# Patient Record
Sex: Male | Born: 1940 | Race: White | Hispanic: No | Smoking: Never smoker
Health system: Southern US, Community
[De-identification: ages and names within clinical notes are randomized; demographics above are authoritative.]

## PROBLEM LIST (undated history)

## (undated) DIAGNOSIS — E785 Hyperlipidemia, unspecified: Secondary | ICD-10-CM

## (undated) DIAGNOSIS — K219 Gastro-esophageal reflux disease without esophagitis: Secondary | ICD-10-CM

## (undated) DIAGNOSIS — I10 Essential (primary) hypertension: Secondary | ICD-10-CM

## (undated) DIAGNOSIS — Z972 Presence of dental prosthetic device (complete) (partial): Secondary | ICD-10-CM

## (undated) DIAGNOSIS — M1990 Unspecified osteoarthritis, unspecified site: Secondary | ICD-10-CM

## (undated) DIAGNOSIS — Z974 Presence of external hearing-aid: Secondary | ICD-10-CM

## (undated) DIAGNOSIS — M542 Cervicalgia: Secondary | ICD-10-CM

## (undated) DIAGNOSIS — I499 Cardiac arrhythmia, unspecified: Secondary | ICD-10-CM

## (undated) DIAGNOSIS — T7840XA Allergy, unspecified, initial encounter: Secondary | ICD-10-CM

## (undated) DIAGNOSIS — D170 Benign lipomatous neoplasm of skin and subcutaneous tissue of head, face and neck: Secondary | ICD-10-CM

## (undated) HISTORY — DX: Essential (primary) hypertension: I10

## (undated) HISTORY — PX: UPPER GI ENDOSCOPY: SHX6162

## (undated) HISTORY — DX: Unspecified osteoarthritis, unspecified site: M19.90

## (undated) HISTORY — DX: Allergy, unspecified, initial encounter: T78.40XA

## (undated) HISTORY — DX: Benign lipomatous neoplasm of skin and subcutaneous tissue of head, face and neck: D17.0

## (undated) HISTORY — DX: Hyperlipidemia, unspecified: E78.5

## (undated) HISTORY — DX: Cervicalgia: M54.2

## (undated) HISTORY — DX: Gastro-esophageal reflux disease without esophagitis: K21.9

## (undated) SURGERY — Surgical Case
Anesthesia: *Unknown

---

## 2006-07-13 ENCOUNTER — Ambulatory Visit: Payer: Self-pay | Admitting: Gastroenterology

## 2011-10-28 HISTORY — PX: GASTRIC OUTLET OBSTRUCTION RELEASE: SHX5247

## 2012-06-04 ENCOUNTER — Ambulatory Visit: Payer: Self-pay | Admitting: Unknown Physician Specialty

## 2012-06-04 LAB — CBC WITH DIFFERENTIAL/PLATELET
Basophil %: 0.7 %
Eosinophil %: 1.8 %
HCT: 25.2 % — ABNORMAL LOW (ref 40.0–52.0)
HGB: 8.8 g/dL — ABNORMAL LOW (ref 13.0–18.0)
Lymphocyte #: 1 10*3/uL (ref 1.0–3.6)
MCH: 31.7 pg (ref 26.0–34.0)
MCV: 91 fL (ref 80–100)
Monocyte #: 0.5 x10 3/mm (ref 0.2–1.0)
Monocyte %: 6.7 %
RBC: 2.78 10*6/uL — ABNORMAL LOW (ref 4.40–5.90)
WBC: 7.2 10*3/uL (ref 3.8–10.6)

## 2012-06-04 LAB — PROTIME-INR: INR: 0.9

## 2012-06-09 ENCOUNTER — Ambulatory Visit: Payer: Self-pay | Admitting: Family Medicine

## 2012-08-13 ENCOUNTER — Ambulatory Visit: Payer: Self-pay | Admitting: Unknown Physician Specialty

## 2012-08-13 LAB — CBC WITH DIFFERENTIAL/PLATELET
Basophil #: 0 10*3/uL (ref 0.0–0.1)
Basophil %: 0.6 %
Eosinophil %: 1.9 %
HCT: 32 % — ABNORMAL LOW (ref 40.0–52.0)
HGB: 10.3 g/dL — ABNORMAL LOW (ref 13.0–18.0)
Lymphocyte #: 0.9 10*3/uL — ABNORMAL LOW (ref 1.0–3.6)
Lymphocyte %: 16.4 %
MCH: 23.7 pg — ABNORMAL LOW (ref 26.0–34.0)
MCHC: 32.3 g/dL (ref 32.0–36.0)
Monocyte %: 5.2 %
Neutrophil #: 4.1 10*3/uL (ref 1.4–6.5)
Neutrophil %: 75.9 %
RBC: 4.36 10*6/uL — ABNORMAL LOW (ref 4.40–5.90)
RDW: 19.5 % — ABNORMAL HIGH (ref 11.5–14.5)

## 2013-10-27 HISTORY — PX: OTHER SURGICAL HISTORY: SHX169

## 2013-11-02 ENCOUNTER — Ambulatory Visit: Payer: Self-pay | Admitting: Gastroenterology

## 2013-11-03 ENCOUNTER — Ambulatory Visit: Payer: Self-pay | Admitting: Gastroenterology

## 2013-11-09 ENCOUNTER — Ambulatory Visit: Payer: Self-pay | Admitting: Surgery

## 2013-11-14 ENCOUNTER — Ambulatory Visit: Payer: Self-pay | Admitting: Surgery

## 2013-11-14 LAB — COMPREHENSIVE METABOLIC PANEL
ALT: 69 U/L (ref 12–78)
AST: 41 U/L — AB (ref 15–37)
Albumin: 3.4 g/dL (ref 3.4–5.0)
Alkaline Phosphatase: 91 U/L
Anion Gap: 5 — ABNORMAL LOW (ref 7–16)
BILIRUBIN TOTAL: 0.5 mg/dL (ref 0.2–1.0)
BUN: 21 mg/dL — ABNORMAL HIGH (ref 7–18)
CALCIUM: 9.1 mg/dL (ref 8.5–10.1)
CHLORIDE: 102 mmol/L (ref 98–107)
Co2: 29 mmol/L (ref 21–32)
Creatinine: 0.9 mg/dL (ref 0.60–1.30)
EGFR (African American): >60
EGFR (Non-African Amer.): >60
Glucose: 112 mg/dL — ABNORMAL HIGH (ref 65–99)
Osmolality: 276 (ref 275–301)
Potassium: 3.8 mmol/L (ref 3.5–5.1)
SODIUM: 136 mmol/L (ref 136–145)
TOTAL PROTEIN: 6.7 g/dL (ref 6.4–8.2)

## 2013-11-14 LAB — CBC WITH DIFFERENTIAL/PLATELET
Basophil #: 0 10*3/uL (ref 0.0–0.1)
Basophil %: 0.6 %
EOS ABS: 0.1 10*3/uL (ref 0.0–0.7)
Eosinophil %: 0.8 %
HCT: 38.5 % — ABNORMAL LOW (ref 40.0–52.0)
HGB: 13.5 g/dL (ref 13.0–18.0)
Lymphocyte #: 1.1 10*3/uL (ref 1.0–3.6)
Lymphocyte %: 16.7 %
MCH: 31.7 pg (ref 26.0–34.0)
MCHC: 35 g/dL (ref 32.0–36.0)
MCV: 91 fL (ref 80–100)
MONOS PCT: 4.6 %
Monocyte #: 0.3 x10 3/mm (ref 0.2–1.0)
NEUTROS ABS: 5.2 10*3/uL (ref 1.4–6.5)
Neutrophil %: 77.3 %
Platelet: 196 10*3/uL (ref 150–440)
RBC: 4.25 10*6/uL — ABNORMAL LOW (ref 4.40–5.90)
RDW: 13.2 % (ref 11.5–14.5)
WBC: 6.7 10*3/uL (ref 3.8–10.6)

## 2013-11-14 LAB — APTT: Activated PTT: 25.7 secs (ref 23.6–35.9)

## 2013-11-14 LAB — PROTIME-INR
INR: 0.9
Prothrombin Time: 12.4 secs (ref 11.5–14.7)

## 2013-11-23 ENCOUNTER — Inpatient Hospital Stay: Payer: Self-pay | Admitting: Surgery

## 2013-11-24 LAB — CBC WITH DIFFERENTIAL/PLATELET
Basophil #: 0 10*3/uL (ref 0.0–0.1)
Basophil %: 0.3 %
Eosinophil #: 0 10*3/uL (ref 0.0–0.7)
Eosinophil %: 0 %
HCT: 38.2 % — ABNORMAL LOW (ref 40.0–52.0)
HGB: 13.7 g/dL (ref 13.0–18.0)
LYMPHS PCT: 3.9 %
Lymphocyte #: 0.4 10*3/uL — ABNORMAL LOW (ref 1.0–3.6)
MCH: 32.9 pg (ref 26.0–34.0)
MCHC: 35.9 g/dL (ref 32.0–36.0)
MCV: 92 fL (ref 80–100)
MONO ABS: 0.6 x10 3/mm (ref 0.2–1.0)
MONOS PCT: 6 %
NEUTROS PCT: 89.8 %
Neutrophil #: 9.5 10*3/uL — ABNORMAL HIGH (ref 1.4–6.5)
Platelet: 163 10*3/uL (ref 150–440)
RBC: 4.16 10*6/uL — ABNORMAL LOW (ref 4.40–5.90)
RDW: 13.7 % (ref 11.5–14.5)
WBC: 10.5 10*3/uL (ref 3.8–10.6)

## 2013-11-24 LAB — BASIC METABOLIC PANEL
Anion Gap: 5 — ABNORMAL LOW (ref 7–16)
BUN: 21 mg/dL — AB (ref 7–18)
Calcium, Total: 8.8 mg/dL (ref 8.5–10.1)
Chloride: 101 mmol/L (ref 98–107)
Co2: 31 mmol/L (ref 21–32)
Creatinine: 1.12 mg/dL (ref 0.60–1.30)
Glucose: 137 mg/dL — ABNORMAL HIGH (ref 65–99)
Osmolality: 279 (ref 275–301)
Potassium: 4.4 mmol/L (ref 3.5–5.1)
Sodium: 137 mmol/L (ref 136–145)

## 2013-11-25 LAB — CBC WITH DIFFERENTIAL/PLATELET
BASOS PCT: 0.3 %
Basophil #: 0 10*3/uL (ref 0.0–0.1)
Eosinophil #: 0 10*3/uL (ref 0.0–0.7)
Eosinophil %: 0.2 %
HCT: 38.2 % — AB (ref 40.0–52.0)
HGB: 13.4 g/dL (ref 13.0–18.0)
LYMPHS PCT: 6.8 %
Lymphocyte #: 0.6 10*3/uL — ABNORMAL LOW (ref 1.0–3.6)
MCH: 32.5 pg (ref 26.0–34.0)
MCHC: 35.2 g/dL (ref 32.0–36.0)
MCV: 93 fL (ref 80–100)
Monocyte #: 0.5 x10 3/mm (ref 0.2–1.0)
Monocyte %: 6.3 %
NEUTROS ABS: 7.5 10*3/uL — AB (ref 1.4–6.5)
NEUTROS PCT: 86.4 %
PLATELETS: 161 10*3/uL (ref 150–440)
RBC: 4.13 10*6/uL — ABNORMAL LOW (ref 4.40–5.90)
RDW: 13.9 % (ref 11.5–14.5)
WBC: 8.7 10*3/uL (ref 3.8–10.6)

## 2013-11-25 LAB — BASIC METABOLIC PANEL
Anion Gap: 1 — ABNORMAL LOW (ref 7–16)
BUN: 19 mg/dL — ABNORMAL HIGH (ref 7–18)
CHLORIDE: 101 mmol/L (ref 98–107)
Calcium, Total: 9.4 mg/dL (ref 8.5–10.1)
Co2: 33 mmol/L — ABNORMAL HIGH (ref 21–32)
Creatinine: 0.99 mg/dL (ref 0.60–1.30)
EGFR (African American): >60
EGFR (Non-African Amer.): >60
GLUCOSE: 125 mg/dL — AB (ref 65–99)
Osmolality: 274 (ref 275–301)
POTASSIUM: 5.7 mmol/L — AB (ref 3.5–5.1)
SODIUM: 135 mmol/L — AB (ref 136–145)

## 2013-11-25 LAB — POTASSIUM: Potassium: 4.3 mmol/L (ref 3.5–5.1)

## 2013-11-27 LAB — BASIC METABOLIC PANEL
ANION GAP: 5 — AB (ref 7–16)
BUN: 12 mg/dL (ref 7–18)
CO2: 31 mmol/L (ref 21–32)
Calcium, Total: 9 mg/dL (ref 8.5–10.1)
Chloride: 102 mmol/L (ref 98–107)
Creatinine: 0.71 mg/dL (ref 0.60–1.30)
EGFR (African American): >60
Glucose: 114 mg/dL — ABNORMAL HIGH (ref 65–99)
Osmolality: 276 (ref 275–301)
Potassium: 3.7 mmol/L (ref 3.5–5.1)
Sodium: 138 mmol/L (ref 136–145)

## 2014-02-02 ENCOUNTER — Ambulatory Visit: Payer: Self-pay | Admitting: Surgery

## 2015-02-17 NOTE — Consult Note (Signed)
PATIENT NAME:  Zachary Brewer, Zachary Brewer MR#:  [id number] DATE OF BIRTH:  Mar 23, 1941  DATE OF CONSULTATION:  11/23/2013  REFERRING PHYSICIAN:  Dia Crawford, MD CONSULTING PHYSICIAN:  Miosotis Wetsel R. Yazmyne Sara, MD PRIMARY CARE PHYSICIAN: Brooke Dare, MD  REASON FOR CONSULTATION: Hypertension, hyperlipidemia, postoperative management.   HISTORY OF PRESENTING ILLNESS: A 74 year old Caucasian male patient with history of hypertension and hyperlipidemia recently had an endoscopy on 11/03/2013 and was found to have duodenal deformity with gastric outlet obstruction. Was brought into the hospital for a Roux-en-Y gastrojejunostomy. The patient is presently in the recovery area after his surgery. He is awake on Ventimask with an NG tube in place with bloody output.   The patient complains of some pain at this time. No shortness of breath, chest pain. He mentions that he has been doing well, has lost some weight although he cannot tell me how much. He also noticed that his blood pressure was running low so he held his blood pressure medication, although he is not sure which one.   PAST MEDICAL HISTORY:  1.  Hypertension.  2.  Hyperlipidemia.  3.  Gastric outlet obstruction.   SOCIAL HISTORY: The patient does not smoke. No alcohol. No illicit drugs. Lives alone. Ambulates on his own.   ALLERGIES: No known drug allergies.   FAMILY HISTORY: Hypertension.   HOME MEDICATIONS:  Include:  1.  Aspirin 81 mg daily. 2.  Enalapril 20 mg oral daily.  3.  Fish oil 2 capsules oral once a day.  4.  Glucosamine chondroitin 2 tablets once a day.  5.  Hydrochlorothiazide 25 mg oral once a day.  6.  Lovastatin 40 mg oral once a day.  7.  Omeprazole 20 mg oral 2 times a day.   REVIEW OF SYSTEMS:  CONSTITUTIONAL: Complains of some fatigue at this time.  EYES: No blurred vision, pain or redness. ENT:  No tinnitus, ear pain, hearing loss.  RESPIRATORY: No shortness of breath.  CARDIOVASCULAR: No chest pain, orthopnea or edema.   GASTROINTESTINAL: Some abdominal pain at this time. No nausea, vomiting.  GENITOURINARY: No dysuria, hematuria, frequency.  ENDOCRINE: No polyuria, nocturia, or thyroid problems. HEMOLYMPHATIC: No anemia, easy bruising, bleeding. INTEGUMENTARY: No acne, rash. Has surgical scar.  NEUROLOGIC: No focal numbness, weakness, or seizures. Feels generally weak.    PHYSICAL EXAMINATION:  VITAL SIGNS: Blood pressure134 /79, pulse of 61, saturating 100%. Breathing 20 per minute.  GENERAL: Frail Caucasian male patient lying in bed, seems comfortable.  PSYCHIATRIC: Alert and oriented x3. Mood and affect appropriate. Judgment intact.  HEENT: Atraumatic, normocephalic. Oral mucosa dry and pink. Pallor positive. External ears and nose normal.  NECK: Supple. No thyromegaly or palpable lymph nodes. Trachea midline. No carotid bruit, JVD.  CARDIOVASCULAR: S1, S2, without any murmurs. Peripheral pulses 2+.  RESPIRATORY: Normal work of breathing. Clear to auscultation on both sides.  GASTROINTESTINAL: Soft abdomen. Nontender. Bowel sounds present. Scar from surgery earlier today. Clean dressing.  GENITOURINARY: No CVA tenderness or bladder distention. Has a Foley in place.  MUSCULOSKELETAL: No joint swelling, redness, effusion of the large joints. Normal muscle tone.  NEUROLOGICAL: Motor strength 5/5 in upper and lower extremities.  SKIN: Warm and dry. No petechiae, rash. Has the surgical scar on the abdomen.  LABORATORY STUDIES: 11/14/2013: Glucose 112, BUN 21, creatinine 0.90. WBC 6.7, hemoglobin 13.5, platelets of 196. Blood group AB positive.   Recent EKG shows sinus bradycardia with heart rate of 57.   ASSESSMENT AND PLAN:  1.  Gastric outlet obstruction,  status post Roux-en-Y procedure. Monitor for any acute blood loss anemia. The patient is presently n.p.o. Has an NG tube in place.  2.  Hypertension. The patient cannot take his oral medications. Will start him on hydralazine IV. Can restart on his  home medications once able to take pills.  3.  Deep venous thrombosis prophylaxis, on Lovenox.   TIME SPENT TODAY ON THIS CONSULT: Was 40 minutes.   ____________________________ Leia Alf Alger Kerstein, MD srs:np D: 11/23/2013 15:36:53 ET T: 11/23/2013 16:25:09 ET JOB#: [id number]  cc: Alveta Heimlich R. Darvin Neighbours, MD, <Dictator> Micheline Maze, MD Arlis Porta., MD Neita Carp MD ELECTRONICALLY SIGNED 12/15/2013 14:09

## 2015-02-17 NOTE — Op Note (Signed)
PATIENT NAME:  Zachary Brewer, DEUPREE MR#:  [id number] DATE OF BIRTH:  June 29, 1941  DATE OF PROCEDURE:  11/23/2013  PREOPERATIVE DIAGNOSIS:  Gastric outlet obstruction.    POSTOPERATIVE DIAGNOSIS: Duodenal obstruction.   SURGERY: Exploratory laparotomy, pyloroplasty, Roux-en-Y gastrojejunostomy.   SURGEON: Rodena Goldmann, M.D.   ASSISTANTGenevive Bi, MD; [place], [place] student.   ANESTHESIA: General.   OPERATIVE PROCEDURE: With the patient in the supine position after the induction of appropriate general anesthesia, the patient's abdomen was prepped with ChloraPrep and draped with sterile towels. A midline incision was made from the subxiphoid area to the supraumbilical area and carried down through the subcutaneous tissue using Bovie electrocautery. Midline fascia was identified and opened the length of  the skin incision, as was the peritoneum. The stomach was significantly dilated. There were residual food particles palpated. Nasogastric tube was in place to help decompress the stomach. The pylorus was markedly dilated. The obstruction appeared to be well down the duodenum. There did not appear to be any evidence of extrinsic compression, no mass in the head of the pancreas, no porta hepatis lesions and no evidence of any adenopathy. A pyloroplasty was made by opening the pylorus longitudinally and examining the pylorus. The muscle was quite thin.  There did not appear to be any obvious deformity. However, well down past the common bile duct it was apparent that the patient had a very clear obstruction. There did not appear to be any active ulceration. The lumen was approximately the size of pencil lead. Bile was coming back up through the hole. Again, we could not palpate any evidence for an extrinsic or intrinsic defect but the obstruction was clearly enough to dilate the pylorus. I did not think that gastrectomy was indicated in this situation. We certainly could not resect the obstruction as it was well down the  duodenum. We performed closure of the pyloroplasty going transversely using 2 applications of the ID78 stapling device carrying a blue load, then oversewing with seromuscular sutures of 3-0 silk.   It was elected to perform a Roux-en-Y gastrojejunostomy. The small bowel was identified in the jejunum of approximately 18 inches from the ligament of Treitz. The bowel was divided and marked.  The GIA-55 stapling device was used to divide the bowel. The mesentery was taken down past the first arcade. An avascular area in the colonic mesentery was chosen and the bowel  passed through the mesentery to the posterior wall of the stomach. The posterior wall of the stomach had been exposed by taking down the omentum and entering the lesser sac. A pursestring clamp was placed across the back wall of the stomach and then 2-0 nylon pursestring placed through the clamp. The bowel was opened. An EEA-25 stapling device was brought to the table. The anvil was placed in the pursestring and the suture tied down around the anvil. The Roux loop was opened and the body of the stapler inserted through the Roux loop, passed out through the anterior mesenteric border with the spike. The spike was married to the anvil, the stapler approximated and fired. Several more centimeters of jejunum were removed and the enterotomy closed with a single application of the EU23 stapling device carrying a blue load. Seromuscular sutures of 3-0 silk were placed to hold the anastomosis in place. There did not appear to be any defect around the mesenteric placement. The afferent loop was then brought downstream on the jejunum. Two loops of bowel were placed side by side along the antimesenteric border  and a small enterotomy made in each loop of bowel. The stapling device was placed in the loop of bowel using a GIA-55 stapler. The stapler was approximated and fired and the enterotomy closed with a single application of the ME15 device carrying a blue load.    The repair appeared to be satisfactory. There did not appear to be significant bleeding. Bowel contents were returned to their anatomic position. The abdomen was copiously irrigated with warm saline solution. The abdomen was closed from each end using looped #1 PDS in a running fashion, tying in the middle. The knot was buried. The skin was stapled using a standard stapling device and sterile dressing applied with dressing sponges and ABD pads. The patient was returned to the recovery room, having tolerated the procedure well. Sponge, instrument and needle counts were correct x 2 in the Operating Room.   ____________________________ Micheline Maze, MD rle:cs D: 11/23/2013 15:04:33 ET T: 11/23/2013 18:35:27 ET JOB#: [id number]  cc: Rodena Goldmann III, MD, <Dictator> Rodena Goldmann MD ELECTRONICALLY SIGNED 11/25/2013 18:08

## 2015-02-17 NOTE — Discharge Summary (Signed)
PATIENT NAME:  Zachary Brewer, Zachary Brewer MR#:  [id number] DATE OF BIRTH:  1941-05-15  DATE OF ADMISSION:  11/23/2013 DATE OF DISCHARGE:  11/29/2013  BRIEF HISTORY: Zachary Brewer is a 74 year old gentleman with gastric outlet obstruction admitted for an elective gastrectomy reconstructive surgery. He had a previous endoscopy, which revealed a very significant narrowing in the pylorus prompting his surgical intervention. He had had multiple problems with weight loss, significant early satiety and significant nausea and vomiting. After an appropriate preoperative preparation and informed consent, he was taken to surgery on the morning of 11/23/2013. At surgery, the pylorus was quite enlarged and the obstruction appeared to be in the second portion of the duodenum. A pyloroplasty was performed in order to identify the stricture and it did not appear to be malignant or extrinsic. We assumed that it was status post peptic ulcer disease with his previous biopsy report. Because of the distal location in the midportion of the duodenum, I did not attempt a resection. We closed the pyloric incision transversely and performed a Roux-en-Y gastric jejunostomy. The procedure was uncomplicated. He had no significant postoperative problems. He was followed by the medical service for his underlying medical problems. He continued to progress to a regular diet. He had no significant pain control issues. He was discharged home on the 3rd to be followed in the office in 7 to 10 days' time. Bathing, activity and driving instructions were given to the patient.   DISCHARGE MEDICATIONS: Include omeprazole 20 mg b.i.d., lovastatin 40 mg once a day, hydrochlorothiazide 25 mg once a day, aspirin 81 mg once a day, glucosamine chondroitin 400/500 once a day, enalapril 20 mg once a day, Allegra p.r.n. and hydrocodone/acetaminophen 5/325 every 4 to 6 hours.  DISCHARGE DIAGNOSIS: Gastric outlet obstruction.   SURGERY: Roux-en-Y gastrojejunostomy.    ____________________________ Rodena Goldmann III, MD rle:aw D: 12/05/2013 23:12:52 ET T: 12/06/2013 07:04:04 ET JOB#: [id number]  cc: Micheline Maze, MD, <Dictator> Lucilla Lame, MD Arlis Porta., MD Rodena Goldmann MD ELECTRONICALLY SIGNED 12/07/2013 1:09

## 2015-05-16 ENCOUNTER — Encounter: Payer: Self-pay | Admitting: Family Medicine

## 2015-05-30 ENCOUNTER — Ambulatory Visit (INDEPENDENT_AMBULATORY_CARE_PROVIDER_SITE_OTHER): Payer: Medicare Other | Admitting: Family Medicine

## 2015-05-30 ENCOUNTER — Encounter: Payer: Self-pay | Admitting: Family Medicine

## 2015-05-30 VITALS — BP 114/69 | HR 63 | Temp 98.2°F | Resp 16 | Ht 66.0 in | Wt 144.2 lb

## 2015-05-30 DIAGNOSIS — J309 Allergic rhinitis, unspecified: Secondary | ICD-10-CM | POA: Insufficient documentation

## 2015-05-30 DIAGNOSIS — K2100 Gastro-esophageal reflux disease with esophagitis, without bleeding: Secondary | ICD-10-CM | POA: Insufficient documentation

## 2015-05-30 DIAGNOSIS — J45909 Unspecified asthma, uncomplicated: Secondary | ICD-10-CM | POA: Insufficient documentation

## 2015-05-30 DIAGNOSIS — R001 Bradycardia, unspecified: Secondary | ICD-10-CM | POA: Insufficient documentation

## 2015-05-30 DIAGNOSIS — D649 Anemia, unspecified: Secondary | ICD-10-CM | POA: Insufficient documentation

## 2015-05-30 DIAGNOSIS — I10 Essential (primary) hypertension: Secondary | ICD-10-CM | POA: Insufficient documentation

## 2015-05-30 DIAGNOSIS — M542 Cervicalgia: Secondary | ICD-10-CM

## 2015-05-30 DIAGNOSIS — K311 Adult hypertrophic pyloric stenosis: Secondary | ICD-10-CM | POA: Diagnosis not present

## 2015-05-30 DIAGNOSIS — D170 Benign lipomatous neoplasm of skin and subcutaneous tissue of head, face and neck: Secondary | ICD-10-CM

## 2015-05-30 DIAGNOSIS — M1990 Unspecified osteoarthritis, unspecified site: Secondary | ICD-10-CM

## 2015-05-30 DIAGNOSIS — N5089 Other specified disorders of the male genital organs: Secondary | ICD-10-CM | POA: Insufficient documentation

## 2015-05-30 DIAGNOSIS — M159 Polyosteoarthritis, unspecified: Secondary | ICD-10-CM | POA: Insufficient documentation

## 2015-05-30 DIAGNOSIS — R739 Hyperglycemia, unspecified: Secondary | ICD-10-CM | POA: Insufficient documentation

## 2015-05-30 DIAGNOSIS — R634 Abnormal weight loss: Secondary | ICD-10-CM | POA: Insufficient documentation

## 2015-05-30 DIAGNOSIS — K921 Melena: Secondary | ICD-10-CM | POA: Insufficient documentation

## 2015-05-30 HISTORY — DX: Cervicalgia: M54.2

## 2015-05-30 HISTORY — DX: Benign lipomatous neoplasm of skin and subcutaneous tissue of head, face and neck: D17.0

## 2015-05-30 HISTORY — DX: Unspecified osteoarthritis, unspecified site: M19.90

## 2015-05-30 NOTE — Progress Notes (Signed)
Name: Zachary Brewer   MRN: [id number]    DOB: 21-Jan-1941   Date:05/30/2015       Progress Note  Subjective  Chief Complaint  Chief Complaint  Patient presents with  . Abdominal Pain    Trouble digesting food.     HPI  C/o abd. Pain and food not going out of stomach for past week.  Has hd some belching and vomiting of old food.  Stomach gets full.  Hx. Of gastric outlet obstruction in past with surgical repair.   Past Medical History  Diagnosis Date  . Allergy   . GERD (gastroesophageal reflux disease)   . Hyperlipidemia   . Hypertension     History  Substance Use Topics  . Smoking status: Never Smoker   . Smokeless tobacco: Never Used  . Alcohol Use: No     Current outpatient prescriptions:  .  aspirin 81 MG chewable tablet, Chew by mouth., Disp: , Rfl:  .  DOCUSATE SODIUM PO, Take by mouth., Disp: , Rfl:  .  Glucosamine-Chondroit-Vit C-Mn (GLUCOSAMINE CHONDR 1500 COMPLX) CAPS, Take by mouth., Disp: , Rfl:  .  hydrochlorothiazide (HYDRODIURIL) 25 MG tablet, Take by mouth., Disp: , Rfl:  .  lovastatin (MEVACOR) 40 MG tablet, Take by mouth., Disp: , Rfl:  .  MULTIPLE VITAMIN PO, Take by mouth., Disp: , Rfl:  .  NUTRITIONAL SUPPLEMENTS PO, Take by mouth., Disp: , Rfl:  .  omeprazole (PRILOSEC) 20 MG capsule, Take by mouth., Disp: , Rfl:  .  tamsulosin (FLOMAX) 0.4 MG CAPS capsule, Take by mouth., Disp: , Rfl:   No Known Allergies  Review of Systems  Constitutional: Positive for weight loss. Negative for fever, chills and malaise/fatigue.  HENT: Negative for congestion.   Eyes: Negative for blurred vision and double vision.  Respiratory: Negative for cough, sputum production, shortness of breath and wheezing.   Cardiovascular: Negative for chest pain, palpitations, orthopnea and leg swelling.  Gastrointestinal: Positive for vomiting and abdominal pain. Negative for heartburn, nausea, diarrhea and blood in stool.  Genitourinary: Negative for dysuria, urgency and  frequency.  Musculoskeletal: Negative for myalgias and joint pain.  Skin: Negative for rash.  Neurological: Negative for dizziness, weakness and headaches.  Psychiatric/Behavioral: Negative for depression. The patient is not nervous/anxious.       Objective  Filed Vitals:   05/30/15 1331  BP: 114/69  Pulse: 63  Temp: 98.2 F (36.8 C)  Resp: 16  Height: 5\' 6"  (1.676 m)  Weight: 144 lb 3.2 oz (65.409 kg)     Physical Exam  Constitutional: He is well-developed, well-nourished, and in no distress. No distress.  HENT:  Head: Normocephalic and atraumatic.  Neck: Normal range of motion. Neck supple. No thyromegaly present.  Cardiovascular: Normal rate, regular rhythm, normal heart sounds and intact distal pulses.   Occasional extrasystoles are present. Exam reveals no gallop and no friction rub.   No murmur heard. Pulmonary/Chest: Effort normal and breath sounds normal. No respiratory distress. He has no wheezes. He has no rales.  Abdominal: Soft. Bowel sounds are normal. He exhibits no distension and no mass. There is no tenderness. There is no rebound and no guarding.  Musculoskeletal: He exhibits no edema.  Lymphadenopathy:    He has no cervical adenopathy.  Vitals reviewed.     No results found for this or any previous visit (from the past 2160 hour(s)).   Assessment & Plan  1. Partial gastric outlet obstruction  - Ambulatory referral to Gastroenterology

## 2015-05-30 NOTE — Patient Instructions (Signed)
Stop Aspirin today.

## 2015-05-31 ENCOUNTER — Encounter: Payer: Self-pay | Admitting: Gastroenterology

## 2015-05-31 ENCOUNTER — Ambulatory Visit (INDEPENDENT_AMBULATORY_CARE_PROVIDER_SITE_OTHER): Payer: Medicare Other | Admitting: Gastroenterology

## 2015-05-31 ENCOUNTER — Other Ambulatory Visit: Payer: Self-pay

## 2015-05-31 VITALS — BP 118/78 | HR 71 | Temp 98.4°F | Ht 67.0 in | Wt 146.0 lb

## 2015-05-31 DIAGNOSIS — R1013 Epigastric pain: Secondary | ICD-10-CM | POA: Diagnosis not present

## 2015-05-31 NOTE — Progress Notes (Signed)
   Primary Care Physician: [name], MD  Primary Gastroenterologist:  Dr. Lucilla Lame  Chief Complaint  Patient presents with  . Abdominal Pain    HPI: Zachary Brewer is a 74 y.o. male here for dyspepsia with the patient reporting that after he eats sometimes a couple of hours later the food is coming up. The patient has had a history of gastric outlet obstruction with a Roux-en-Y by Dr. Pat Patrick. The patient states that sometimes the food feels like it won't go down for for few days. The patient has lost weight but he states he has gained it back.  Current Outpatient Prescriptions  Medication Sig Dispense Refill  . DOCUSATE SODIUM PO Take by mouth.    . Glucosamine-Chondroit-Vit C-Mn (GLUCOSAMINE CHONDR 1500 COMPLX) CAPS Take by mouth.    . hydrochlorothiazide (HYDRODIURIL) 25 MG tablet Take by mouth.    . lovastatin (MEVACOR) 40 MG tablet Take by mouth.    . MULTIPLE VITAMIN PO Take by mouth.    . NUTRITIONAL SUPPLEMENTS PO Take by mouth.    Marland Kitchen omeprazole (PRILOSEC) 20 MG capsule Take by mouth.    . tamsulosin (FLOMAX) 0.4 MG CAPS capsule Take by mouth.     No current facility-administered medications for this visit.    Allergies as of 05/31/2015  . (No Known Allergies)    ROS:  General: Negative for anorexia, weight loss, fever, chills, fatigue, weakness. ENT: Negative for hoarseness, difficulty swallowing , nasal congestion. CV: Negative for chest pain, angina, palpitations, dyspnea on exertion, peripheral edema.  Respiratory: Negative for dyspnea at rest, dyspnea on exertion, cough, sputum, wheezing.  GI: See history of present illness. GU:  Negative for dysuria, hematuria, urinary incontinence, urinary frequency, nocturnal urination.  Endo: Negative for unusual weight change.    Physical Examination:   BP 118/78 mmHg  Pulse 71  Temp(Src) 98.4 F (36.9 C) (Oral)  Ht 5\' 7"  (1.702 m)  Wt 146 lb (66.225 kg)  BMI 22.86 kg/m2  General: Well-nourished, well-developed  in no acute distress.  Eyes: No icterus. Conjunctivae pink. Mouth: Oropharyngeal mucosa moist and pink , no lesions erythema or exudate. Abdomen: Bowel sounds are normal, nontender, nondistended, no hepatosplenomegaly or masses, no abdominal bruits or hernia , no rebound or guarding.   Extremities: No lower extremity edema. No clubbing or deformities. Neuro: Alert and oriented x 3.  Grossly intact. Skin: Warm and dry, no jaundice.   Psych: Alert and cooperative, normal mood and affect.  Labs:    Imaging Studies: No results found.  Assessment and Plan:   Zachary Brewer is a 74 y.o. y/o male has a history of gastric outlet obstruction with a resulting surgery of a Roux-en-Y. The patient now has some abdominal discomfort with regurgitation and the feeling that food is getting stuck. The patient will be set up for an EGD. The patient has been explained the plan and agrees with it. I have discussed risks & benefits which include, but are not limited to, bleeding, infection, perforation & drug reaction.  The patient agrees with this plan & written consent will be obtained.      Note: This dictation was prepared with Dragon dictation along with smaller phrase technology. Any transcriptional errors that result from this process are unintentional.

## 2015-06-18 NOTE — Discharge Instructions (Signed)

## 2015-06-20 ENCOUNTER — Ambulatory Visit (AMBULATORY_SURGERY_CENTER)
Admission: RE | Admit: 2015-06-20 | Discharge: 2015-06-20 | Disposition: A | Discharge status: Other Acute Inpatient Hospital | Payer: Medicare Other | Source: Ambulatory Visit | Attending: Gastroenterology | Admitting: Gastroenterology

## 2015-06-20 ENCOUNTER — Ambulatory Visit: Payer: Medicare Other

## 2015-06-20 ENCOUNTER — Encounter
Admission: RE | Disposition: A | Discharge status: Other Acute Inpatient Hospital | Payer: Self-pay | Source: Ambulatory Visit | Attending: Gastroenterology

## 2015-06-20 ENCOUNTER — Ambulatory Visit: Payer: Medicare Other | Admitting: Anesthesiology

## 2015-06-20 ENCOUNTER — Inpatient Hospital Stay
Admission: AD | Admit: 2015-06-20 | Discharge: 2015-06-25 | DRG: 163 | Disposition: A | Discharge status: Home / Self Care | Payer: Medicare Other | Source: Ambulatory Visit | Attending: Internal Medicine | Admitting: Internal Medicine

## 2015-06-20 ENCOUNTER — Inpatient Hospital Stay: Payer: Medicare Other

## 2015-06-20 DIAGNOSIS — Z823 Family history of stroke: Secondary | ICD-10-CM

## 2015-06-20 DIAGNOSIS — R1310 Dysphagia, unspecified: Secondary | ICD-10-CM | POA: Diagnosis present

## 2015-06-20 DIAGNOSIS — I959 Hypotension, unspecified: Secondary | ICD-10-CM | POA: Diagnosis present

## 2015-06-20 DIAGNOSIS — Z818 Family history of other mental and behavioral disorders: Secondary | ICD-10-CM | POA: Diagnosis not present

## 2015-06-20 DIAGNOSIS — Z79899 Other long term (current) drug therapy: Secondary | ICD-10-CM

## 2015-06-20 DIAGNOSIS — M1990 Unspecified osteoarthritis, unspecified site: Secondary | ICD-10-CM | POA: Diagnosis present

## 2015-06-20 DIAGNOSIS — Z048 Encounter for examination and observation for other specified reasons: Secondary | ICD-10-CM | POA: Diagnosis not present

## 2015-06-20 DIAGNOSIS — I10 Essential (primary) hypertension: Secondary | ICD-10-CM | POA: Diagnosis present

## 2015-06-20 DIAGNOSIS — J9601 Acute respiratory failure with hypoxia: Secondary | ICD-10-CM | POA: Diagnosis present

## 2015-06-20 DIAGNOSIS — E876 Hypokalemia: Secondary | ICD-10-CM | POA: Diagnosis not present

## 2015-06-20 DIAGNOSIS — K219 Gastro-esophageal reflux disease without esophagitis: Secondary | ICD-10-CM | POA: Diagnosis present

## 2015-06-20 DIAGNOSIS — E440 Moderate protein-calorie malnutrition: Secondary | ICD-10-CM | POA: Diagnosis present

## 2015-06-20 DIAGNOSIS — E785 Hyperlipidemia, unspecified: Secondary | ICD-10-CM | POA: Diagnosis present

## 2015-06-20 DIAGNOSIS — R739 Hyperglycemia, unspecified: Secondary | ICD-10-CM | POA: Diagnosis present

## 2015-06-20 DIAGNOSIS — J9811 Atelectasis: Secondary | ICD-10-CM | POA: Diagnosis present

## 2015-06-20 DIAGNOSIS — K5669 Other intestinal obstruction: Secondary | ICD-10-CM

## 2015-06-20 DIAGNOSIS — Z8249 Family history of ischemic heart disease and other diseases of the circulatory system: Secondary | ICD-10-CM

## 2015-06-20 DIAGNOSIS — J690 Pneumonitis due to inhalation of food and vomit: Secondary | ICD-10-CM | POA: Diagnosis not present

## 2015-06-20 DIAGNOSIS — E441 Mild protein-calorie malnutrition: Secondary | ICD-10-CM | POA: Insufficient documentation

## 2015-06-20 DIAGNOSIS — R05 Cough: Secondary | ICD-10-CM

## 2015-06-20 DIAGNOSIS — R0689 Other abnormalities of breathing: Secondary | ICD-10-CM | POA: Diagnosis not present

## 2015-06-20 DIAGNOSIS — T17308A Unspecified foreign body in larynx causing other injury, initial encounter: Secondary | ICD-10-CM | POA: Diagnosis not present

## 2015-06-20 DIAGNOSIS — D66 Hereditary factor VIII deficiency: Secondary | ICD-10-CM | POA: Diagnosis not present

## 2015-06-20 DIAGNOSIS — K311 Adult hypertrophic pyloric stenosis: Secondary | ICD-10-CM | POA: Diagnosis present

## 2015-06-20 DIAGNOSIS — R059 Cough, unspecified: Secondary | ICD-10-CM

## 2015-06-20 DIAGNOSIS — T17890A Other foreign object in other parts of respiratory tract causing asphyxiation, initial encounter: Secondary | ICD-10-CM | POA: Diagnosis present

## 2015-06-20 DIAGNOSIS — T17990A Other foreign object in respiratory tract, part unspecified in causing asphyxiation, initial encounter: Secondary | ICD-10-CM | POA: Diagnosis not present

## 2015-06-20 DIAGNOSIS — T17908A Unspecified foreign body in respiratory tract, part unspecified causing other injury, initial encounter: Secondary | ICD-10-CM

## 2015-06-20 DIAGNOSIS — Z833 Family history of diabetes mellitus: Secondary | ICD-10-CM

## 2015-06-20 DIAGNOSIS — R4702 Dysphasia: Secondary | ICD-10-CM | POA: Diagnosis present

## 2015-06-20 DIAGNOSIS — Z9889 Other specified postprocedural states: Secondary | ICD-10-CM

## 2015-06-20 DIAGNOSIS — T17908D Unspecified foreign body in respiratory tract, part unspecified causing other injury, subsequent encounter: Secondary | ICD-10-CM

## 2015-06-20 DIAGNOSIS — I700 Atherosclerosis of aorta: Secondary | ICD-10-CM | POA: Diagnosis not present

## 2015-06-20 DIAGNOSIS — R0989 Other specified symptoms and signs involving the circulatory and respiratory systems: Secondary | ICD-10-CM | POA: Diagnosis not present

## 2015-06-20 DIAGNOSIS — R609 Edema, unspecified: Secondary | ICD-10-CM | POA: Diagnosis present

## 2015-06-20 DIAGNOSIS — Z6822 Body mass index (BMI) 22.0-22.9, adult: Secondary | ICD-10-CM | POA: Diagnosis not present

## 2015-06-20 DIAGNOSIS — J181 Lobar pneumonia, unspecified organism: Secondary | ICD-10-CM | POA: Diagnosis not present

## 2015-06-20 DIAGNOSIS — K56699 Other intestinal obstruction unspecified as to partial versus complete obstruction: Secondary | ICD-10-CM | POA: Insufficient documentation

## 2015-06-20 DIAGNOSIS — I2510 Atherosclerotic heart disease of native coronary artery without angina pectoris: Secondary | ICD-10-CM | POA: Diagnosis not present

## 2015-06-20 DIAGNOSIS — N400 Enlarged prostate without lower urinary tract symptoms: Secondary | ICD-10-CM | POA: Diagnosis present

## 2015-06-20 DIAGNOSIS — R7981 Abnormal blood-gas level: Secondary | ICD-10-CM

## 2015-06-20 DIAGNOSIS — R1013 Epigastric pain: Secondary | ICD-10-CM | POA: Diagnosis not present

## 2015-06-20 DIAGNOSIS — R0902 Hypoxemia: Secondary | ICD-10-CM | POA: Diagnosis not present

## 2015-06-20 HISTORY — PX: ESOPHAGOGASTRODUODENOSCOPY (EGD) WITH PROPOFOL: SHX5813

## 2015-06-20 LAB — CBC
HCT: 45.7 % (ref 40.0–52.0)
Hemoglobin: 15.8 g/dL (ref 13.0–18.0)
MCH: 31.5 pg (ref 26.0–34.0)
MCHC: 34.6 g/dL (ref 32.0–36.0)
MCV: 91.1 fL (ref 80.0–100.0)
PLATELETS: 157 10*3/uL (ref 150–440)
RBC: 5.01 MIL/uL (ref 4.40–5.90)
RDW: 13.5 % (ref 11.5–14.5)
WBC: 4.2 10*3/uL (ref 3.8–10.6)

## 2015-06-20 LAB — BASIC METABOLIC PANEL
Anion gap: 11 (ref 5–15)
BUN: 12 mg/dL (ref 6–20)
CO2: 34 mmol/L — ABNORMAL HIGH (ref 22–32)
Calcium: 9 mg/dL (ref 8.9–10.3)
Chloride: 96 mmol/L — ABNORMAL LOW (ref 101–111)
Creatinine, Ser: 0.72 mg/dL (ref 0.61–1.24)
GFR calc Af Amer: >60 mL/min (ref >60)
GLUCOSE: 129 mg/dL — AB (ref 65–99)
POTASSIUM: 3.4 mmol/L — AB (ref 3.5–5.1)
Sodium: 141 mmol/L (ref 135–145)

## 2015-06-20 SURGERY — ESOPHAGOGASTRODUODENOSCOPY (EGD) WITH PROPOFOL
Anesthesia: Monitor Anesthesia Care | Wound class: Clean Contaminated

## 2015-06-20 MED ORDER — CLINDAMYCIN PHOSPHATE 300 MG/50ML IV SOLN
300.0000 mg | Freq: Three times a day (TID) | INTRAVENOUS | Status: DC
  Administered 2015-06-20 – 2015-06-25 (×15): 300 mg via INTRAVENOUS
  Filled 2015-06-20 (×19): qty 50

## 2015-06-20 MED ORDER — GLYCOPYRROLATE 0.2 MG/ML IJ SOLN
INTRAMUSCULAR | Status: DC | PRN
  Administered 2015-06-20: 0.1 mg via INTRAVENOUS

## 2015-06-20 MED ORDER — LIDOCAINE HCL (CARDIAC) 20 MG/ML IV SOLN
INTRAVENOUS | Status: DC | PRN
  Administered 2015-06-20: 30 mg via INTRAVENOUS

## 2015-06-20 MED ORDER — IPRATROPIUM-ALBUTEROL 0.5-2.5 (3) MG/3ML IN SOLN
3.0000 mL | RESPIRATORY_TRACT | Status: DC
  Administered 2015-06-20 – 2015-06-25 (×22): 3 mL via RESPIRATORY_TRACT
  Filled 2015-06-20 (×23): qty 3

## 2015-06-20 MED ORDER — ALBUTEROL SULFATE (2.5 MG/3ML) 0.083% IN NEBU
2.5000 mg | INHALATION_SOLUTION | Freq: Four times a day (QID) | RESPIRATORY_TRACT | Status: DC | PRN
  Administered 2015-06-20: 2.5 mg via RESPIRATORY_TRACT

## 2015-06-20 MED ORDER — HYDROCODONE-ACETAMINOPHEN 5-325 MG PO TABS
1.0000 | ORAL_TABLET | ORAL | Status: DC | PRN
  Administered 2015-06-21: 1 via ORAL
  Filled 2015-06-20: qty 1

## 2015-06-20 MED ORDER — OXYCODONE HCL 5 MG/5ML PO SOLN: 5.0000 mg | Freq: Once | ORAL | Status: DC | PRN

## 2015-06-20 MED ORDER — OXYCODONE HCL 5 MG PO TABS: 5.0000 mg | ORAL_TABLET | Freq: Once | ORAL | Status: DC | PRN

## 2015-06-20 MED ORDER — ACETAMINOPHEN 325 MG PO TABS
650.0000 mg | ORAL_TABLET | Freq: Four times a day (QID) | ORAL | Status: DC | PRN
  Administered 2015-06-21: 18:00:00 650 mg via ORAL
  Filled 2015-06-20: qty 2

## 2015-06-20 MED ORDER — SENNOSIDES-DOCUSATE SODIUM 8.6-50 MG PO TABS: 1.0000 | ORAL_TABLET | Freq: Every evening | ORAL | Status: DC | PRN

## 2015-06-20 MED ORDER — LACTATED RINGERS IV SOLN
INTRAVENOUS | Status: DC
  Administered 2015-06-20: 08:00:00 via INTRAVENOUS

## 2015-06-20 MED ORDER — PROPOFOL 10 MG/ML IV BOLUS
INTRAVENOUS | Status: DC | PRN
  Administered 2015-06-20: 20 mg via INTRAVENOUS
  Administered 2015-06-20: 100 mg via INTRAVENOUS

## 2015-06-20 MED ORDER — TAMSULOSIN HCL 0.4 MG PO CAPS
0.4000 mg | ORAL_CAPSULE | Freq: Every day | ORAL | Status: DC
  Administered 2015-06-21 – 2015-06-25 (×5): 0.4 mg via ORAL
  Filled 2015-06-20 (×5): qty 1

## 2015-06-20 MED ORDER — STERILE WATER FOR IRRIGATION IR SOLN
Status: DC | PRN
  Administered 2015-06-20: 08:00:00

## 2015-06-20 MED ORDER — IOHEXOL 350 MG/ML SOLN
75.0000 mL | Freq: Once | INTRAVENOUS | Status: AC | PRN
Start: 2015-06-20 — End: 2015-06-20
  Administered 2015-06-20: 75 mL via INTRAVENOUS

## 2015-06-20 MED ORDER — ACETAMINOPHEN 650 MG RE SUPP: 650.0000 mg | Freq: Four times a day (QID) | RECTAL | Status: DC | PRN

## 2015-06-20 MED ORDER — SODIUM CHLORIDE 0.9 % IV SOLN: INTRAVENOUS | Status: DC

## 2015-06-20 MED ORDER — ONDANSETRON HCL 4 MG/2ML IJ SOLN: 4.0000 mg | Freq: Four times a day (QID) | INTRAMUSCULAR | Status: DC | PRN

## 2015-06-20 MED ORDER — PRAVASTATIN SODIUM 20 MG PO TABS
20.0000 mg | ORAL_TABLET | Freq: Every day | ORAL | Status: DC
  Administered 2015-06-20 – 2015-06-24 (×5): 20 mg via ORAL
  Filled 2015-06-20 (×5): qty 1

## 2015-06-20 MED ORDER — POTASSIUM CHLORIDE 20 MEQ/15ML (10%) PO SOLN
40.0000 meq | Freq: Once | ORAL | Status: AC
  Administered 2015-06-20: 40 meq via ORAL
  Filled 2015-06-20 (×2): qty 30

## 2015-06-20 MED ORDER — HYDROCHLOROTHIAZIDE 25 MG PO TABS
25.0000 mg | ORAL_TABLET | Freq: Every day | ORAL | Status: DC
  Administered 2015-06-20: 25 mg via ORAL
  Filled 2015-06-20: qty 1

## 2015-06-20 MED ORDER — ENOXAPARIN SODIUM 40 MG/0.4ML ~~LOC~~ SOLN
40.0000 mg | SUBCUTANEOUS | Status: DC
  Administered 2015-06-20 – 2015-06-25 (×6): 40 mg via SUBCUTANEOUS
  Filled 2015-06-20 (×6): qty 0.4

## 2015-06-20 MED ORDER — ALUM & MAG HYDROXIDE-SIMETH 200-200-20 MG/5ML PO SUSP: 30.0000 mL | Freq: Four times a day (QID) | ORAL | Status: DC | PRN

## 2015-06-20 MED ORDER — PANTOPRAZOLE SODIUM 40 MG PO TBEC
40.0000 mg | DELAYED_RELEASE_TABLET | Freq: Every day | ORAL | Status: DC
  Administered 2015-06-20 – 2015-06-25 (×6): 40 mg via ORAL
  Filled 2015-06-20 (×6): qty 1

## 2015-06-20 MED ORDER — ONDANSETRON HCL 4 MG PO TABS: 4.0000 mg | ORAL_TABLET | Freq: Four times a day (QID) | ORAL | Status: DC | PRN

## 2015-06-20 SURGICAL SUPPLY — 40 items
BALLN DILATOR 10-12 8 (BALLOONS)
BALLN DILATOR 12-15 8 (BALLOONS)
BALLN DILATOR 15-18 8 (BALLOONS)
BALLN DILATOR CRE 0-12 8 (BALLOONS)
BALLN DILATOR ESOPH 8 10 CRE (MISCELLANEOUS) IMPLANT
BALLOON DILATOR 12-15 8 (BALLOONS) IMPLANT
BALLOON DILATOR 15-18 8 (BALLOONS) IMPLANT
BALLOON DILATOR CRE 0-12 8 (BALLOONS) IMPLANT
BLOCK BITE 60FR ADLT L/F GRN (MISCELLANEOUS) ×3 IMPLANT
CANISTER SUCT 1200ML W/VALVE (MISCELLANEOUS) ×3 IMPLANT
ESOPHAGEAL BALLOON ×3 IMPLANT
FCP ESCP3.2XJMB 240X2.8X (MISCELLANEOUS)
FORCEPS BIOP RAD 4 LRG CAP 4 (CUTTING FORCEPS) IMPLANT
FORCEPS BIOP RJ4 240 W/NDL (MISCELLANEOUS)
FORCEPS ESCP3.2XJMB 240X2.8X (MISCELLANEOUS) IMPLANT
GOWN CVR UNV OPN BCK APRN NK (MISCELLANEOUS) ×2 IMPLANT
GOWN ISOL THUMB LOOP REG UNIV (MISCELLANEOUS) ×4
HEMOCLIP INSTINCT (CLIP) IMPLANT
INJECTOR VARIJECT VIN23 (MISCELLANEOUS) IMPLANT
KIT CO2 TUBING (TUBING) IMPLANT
KIT DEFENDO VALVE AND CONN (KITS) IMPLANT
KIT ENDO PROCEDURE OLY (KITS) ×3 IMPLANT
LIGATOR MULTIBAND 6SHOOTER MBL (MISCELLANEOUS) IMPLANT
MARKER SPOT ENDO TATTOO 5ML (MISCELLANEOUS) IMPLANT
PAD GROUND ADULT SPLIT (MISCELLANEOUS) IMPLANT
SNARE SHORT THROW 13M SML OVAL (MISCELLANEOUS) IMPLANT
SNARE SHORT THROW 30M LRG OVAL (MISCELLANEOUS) IMPLANT
SPOT EX ENDOSCOPIC TATTOO (MISCELLANEOUS)
SUCTION POLY TRAP 4CHAMBER (MISCELLANEOUS) IMPLANT
SYR INFLATION 60ML (SYRINGE) ×3 IMPLANT
TRAP SUCTION POLY (MISCELLANEOUS) IMPLANT
TUBING CONN 6MMX3.1M (TUBING)
TUBING SUCTION CONN 0.25 STRL (TUBING) IMPLANT
UNDERPAD 30X60 958B10 (PK) (MISCELLANEOUS) IMPLANT
VALVE BIOPSY ENDO (VALVE) IMPLANT
VARIJECT INJECTOR VIN23 (MISCELLANEOUS)
WATER AUXILLARY (MISCELLANEOUS) IMPLANT
WATER STERILE IRR 250ML POUR (IV SOLUTION) ×3 IMPLANT
WATER STERILE IRR 500ML POUR (IV SOLUTION) IMPLANT
WIRE CRE 18-20MM 8CM F G (MISCELLANEOUS) IMPLANT

## 2015-06-20 NOTE — Anesthesia Postprocedure Evaluation (Addendum)
  Anesthesia Post-op Note  Patient: Zachary Brewer  Procedure(s) Performed: Procedure(s): ESOPHAGOGASTRODUODENOSCOPY (EGD) WITH PROPOFOL with dialation (N/A)  Anesthesia type:MAC  Patient location: PACU  Post pain: Pain level controlled  Post assessment: Post-op Vital signs reviewed, Patient's Cardiovascular Status Stable, Respiratory Function Stable, Patent Airway and No signs of Nausea or vomiting  Post vital signs: Reviewed and stable  Last Vitals:  Filed Vitals:   06/20/15 1015  BP: 122/81  Pulse: 78  Temp:   Resp: 30    Level of consciousness: awake, alert  and patient cooperative  Complications: No apparent anesthesia complications.  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  Stricture was dilated by Dr. Allen Norris.  Food was suctioned as best as possible.  In the PACU, pt had RA sats of 86-88% (preop 98%).  Pt put on [place] canula and slight wheezing treated with albuterol nebs. No fever.  CXR revealed: Right lower lobe consolidation. Possible mucous plugging.  D/w Dr. Allen Norris, family and pt.  Decision made to admit to hospital for observation. Pt to be transferred to [facility] via ambulance.

## 2015-06-20 NOTE — Evaluation (Signed)
Clinical/Bedside Swallow Evaluation Patient Details  Name: Zachary Brewer MRN: [id number] Date of Birth: 1940-11-23  Today's Date: 06/20/2015 Time: SLP Start Time (ACUTE ONLY): 0175 SLP Stop Time (ACUTE ONLY): 1440 SLP Time Calculation (min) (ACUTE ONLY): 45 min  Past Medical History:  Past Medical History  Diagnosis Date  . Allergy   . GERD (gastroesophageal reflux disease)   . Hyperlipidemia   . Hypertension   . Arthritis 05/30/2015  . 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 Cervical pain 05/30/2015   Past Surgical History:  Past Surgical History  Procedure Laterality Date  . Gastric outlet obstruction release  2013  . 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 Upper gi endoscopy     HPI:  Zachary Brewer is a 74 y.o. male with a known history of gastric outlet obstruction and essential hypertension who presents with above issue. This morning patient underwent endoscopy for severe dysphasia. 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 Stricture was dilated by Dr. Allen Norris. Fluid was suctioned as best as possible. In the PACU patient had room-air sats of 86-88% and was placed on nasal cannula. Patient's saturations did not improve and therefore hospital service was called to make the patient for acute respiratory failure and aspiration pneumonia. His O2 sats are greater than 90% on 4 L oxygen.   Assessment / Plan / Recommendation Clinical Impression  Pt presents w/oral dysphagia d/t pt edentulous and ill fitting denture (not present) and is at mild-moderate risk of aspiration w/solids.  No overt s/s of aspiration were observed w/any tested consistency. Laryngeal elevation appeared adequate and vocal quality remained clear. Did not test solids d/t pt's recent EGD and lack of dentition. Pt's family also reports that pt does not masticate his food well. Oral phase was [facility] for thin and puree consistencies.  Recommend puree diet w/thin liquids. Strict aspiration  precautions (discussed at length with pt and family). Will f/u re: toleration of diet in 1-2 days.     Aspiration Risk  Mild    Diet Recommendation Dysphagia 1 (Puree);Thin   Medication Administration: Crushed with puree Compensations: Minimize environmental distractions;Slow rate;Small sips/bites    Other  Recommendations Oral Care Recommendations: Patient independent with oral care   Follow Up Recommendations       Frequency and Duration min 2x/week  1 week   Pertinent Vitals/Pain No/denies    SLP Swallow Goals     Swallow Study Prior Functional Status       General Date of Onset: 06/20/15 Other Pertinent Information: Zachary Brewer is a 74 y.o. male with a known history of gastric outlet obstruction and essential hypertension who presents with above issue. This morning patient underwent endoscopy for severe dysphasia. 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 Stricture was dilated by Dr. Allen Norris. Fluid was suctioned as best as possible. In the PACU patient had room-air sats of 86-88% and was placed on nasal cannula. Patient's saturations did not improve and therefore hospital service was called to make the patient for acute respiratory failure and aspiration pneumonia. His O2 sats are greater than 90% on 4 L oxygen. Type of Study: Bedside swallow evaluation Previous Swallow Assessment: None reported Diet Prior to this Study: Thin liquids;Other (Comment) (clear liquids) Temperature Spikes Noted: Yes Respiratory Status: Room air History of Recent Intubation: No Behavior/Cognition: Alert;Pleasant mood Oral Cavity - Dentition: Edentulous Self-Feeding Abilities: Needs set up Patient Positioning: Upright in bed  Baseline Vocal Quality: Normal Volitional Cough: Strong Volitional Swallow: Able to elicit    Oral/Motor/Sensory Function Overall Oral Motor/Sensory Function: Appears within functional limits for tasks  assessed Labial ROM: Within Functional Limits Labial Symmetry: Within Functional Limits Labial Strength: Within Functional Limits Labial Sensation: Within Functional Limits Lingual ROM: Within Functional Limits Lingual Symmetry: Within Functional Limits Lingual Strength: Within Functional Limits Lingual Sensation: Within Functional Limits Facial ROM: Within Functional Limits Facial Symmetry: Within Functional Limits Facial Strength: Within Functional Limits Facial Sensation: Within Functional Limits Velum: Within Functional Limits Mandible: Within Functional Limits   Ice Chips Ice chips: Not tested   Thin Liquid Thin Liquid: Within functional limits Presentation: Cup;Straw;Self Fed Other Comments: No overt s/s of aspiration were observed w/2 ounces of thin.    Nectar Thick Nectar Thick Liquid: Not tested   Honey Thick Honey Thick Liquid: Not tested   Puree Puree: Within functional limits Presentation: Self Fed Other Comments: 4 tsps of puree. No overt s/s of aspiration.   Solid   GO    Solid: Not tested       [facility] 06/20/2015,3:09 PM

## 2015-06-20 NOTE — Anesthesia Preprocedure Evaluation (Signed)
Anesthesia Evaluation  Patient identified by MRN, date of birth, ID band  Reviewed: NPO status   History of Anesthesia Complications Negative for: history of anesthetic complications  Airway Mallampati: II  TM Distance: >3 FB Neck ROM: full    Dental  (+) Edentulous Lower, Edentulous Upper   Pulmonary asthma ,    Pulmonary exam normal       Cardiovascular Exercise Tolerance: Good hypertension, Normal cardiovascular exam Lipids    Neuro/Psych negative neurological ROS  negative psych ROS   GI/Hepatic Neg liver ROS, GERD-  Medicated,  Endo/Other  negative endocrine ROS  Renal/GU negative Renal ROS   bph    Musculoskeletal  (+) Arthritis -,   Abdominal   Peds  Hematology negative hematology ROS (+)   Anesthesia Other Findings   Reproductive/Obstetrics                             Anesthesia Physical Anesthesia Plan  ASA: II  Anesthesia Plan: MAC   Post-op Pain Management:    Induction:   Airway Management Planned:   Additional Equipment:   Intra-op Plan:   Post-operative Plan:   Informed Consent: I have reviewed the patients History and Physical, chart, labs and discussed the procedure including the risks, benefits and alternatives for the proposed anesthesia with the patient or authorized representative who has indicated his/her understanding and acceptance.     Plan Discussed with: CRNA  Anesthesia Plan Comments:         Anesthesia Quick Evaluation

## 2015-06-20 NOTE — Transfer of Care (Signed)
Immediate Anesthesia Transfer of Care Note  Patient: Zachary Brewer  Procedure(s) Performed: Procedure(s): ESOPHAGOGASTRODUODENOSCOPY (EGD) WITH PROPOFOL with dialation (N/A)  Patient Location: PACU  Anesthesia Type: MAC  Level of Consciousness: awake, alert  and patient cooperative  Airway and Oxygen Therapy: Patient Spontanous Breathing and Patient connected to supplemental oxygen  Post-op Assessment: Post-op Vital signs reviewed, Patient's Cardiovascular Status Stable, Respiratory Function Stable, Patent Airway and No signs of Nausea or vomiting  Post-op Vital Signs: Reviewed and stable  Complications: No apparent anesthesia complications

## 2015-06-20 NOTE — Anesthesia Postprocedure Evaluation (Deleted)
  Anesthesia Post-op Note  Patient: Zachary Brewer  Procedure(s) Performed: Procedure(s): ESOPHAGOGASTRODUODENOSCOPY (EGD) WITH PROPOFOL (N/A)  Anesthesia type:MAC  Patient location: PACU  Post pain: Pain level controlled  Post assessment: Post-op Vital signs reviewed, Patient's Cardiovascular Status Stable, Respiratory Function Stable, Patent Airway and No signs of Nausea or vomiting  Post vital signs: Reviewed and stable  Last Vitals:  Filed Vitals:   06/20/15 0714  BP: 154/96  Pulse: 61  Temp: 36.6 C  Resp: 18    Level of consciousness: awake, alert  and patient cooperative  Complications: No apparent anesthesia complications

## 2015-06-20 NOTE — Progress Notes (Signed)
I have received the consult and have reviewed the radiological studies. Patient was admitted post EGD with possible aspiration. He has a RLL consolidation with possible collapse. He has been started on antibiotics (clindamycin) which is appropriate. I would suggest a CT scan and possibly a bronchoscopy.  Will leave full note to follow  Allyne Gee, MD

## 2015-06-20 NOTE — H&P (Signed)
  [facility]  [address] [place], [place] [address] Phone: [phone] Fax : [phone]  Primary Care Physician:  [name], MD Primary Gastroenterologist:  Dr. Allen Norris  Pre-Procedure History & Physical: HPI:  Zachary Brewer is a 74 y.o. male is here for an endoscopy.   Past Medical History  Diagnosis Date  . Allergy   . GERD (gastroesophageal reflux disease)   . Hyperlipidemia   . Hypertension   . Arthritis 05/30/2015  . 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 Cervical pain 05/30/2015    Past Surgical History  Procedure Laterality Date  . Gastric outlet obstruction release  2013  . 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 Upper gi endoscopy      Prior to Admission medications   Medication Sig Start Date End Date Taking? Authorizing Provider  DOCUSATE SODIUM PO Take by mouth.   Yes Historical Provider, MD  Glucosamine-Chondroit-Vit C-Mn (GLUCOSAMINE CHONDR 1500 COMPLX) CAPS Take by mouth. 07/28/14  Yes Historical Provider, MD  hydrochlorothiazide (HYDRODIURIL) 25 MG tablet Take by mouth. 01/11/15  Yes Historical Provider, MD  lovastatin (MEVACOR) 40 MG tablet Take by mouth. 01/11/15  Yes Historical Provider, MD  MULTIPLE VITAMIN PO Take by mouth.   Yes Historical Provider, MD  NUTRITIONAL SUPPLEMENTS PO Take by mouth.   Yes Historical Provider, MD  omeprazole (PRILOSEC) 20 MG capsule Take by mouth. 01/11/15  Yes Historical Provider, MD  tamsulosin (FLOMAX) 0.4 MG CAPS capsule Take by mouth. 01/11/15  Yes Historical Provider, MD    Allergies as of 05/31/2015  . (No Known Allergies)    Family History  Problem Relation Age of Onset  . Diabetes Mother   . Heart disease Father   . Diabetes Sister   . Diabetes Brother   . Stroke Brother   . Mental illness Brother   . Hypertension Paternal Grandfather     Social History   Social History  . Marital Status: Widowed    Spouse Name: N/A  . Number of Children: N/A  . Years of Education: N/A   Occupational  History  . Not on file.   Social History Main Topics  . Smoking status: Never Smoker   . Smokeless tobacco: Never Used  . Alcohol Use: No  . Drug Use: No  . Sexual Activity: Not on file   Other Topics Concern  . Not on file   Social History Narrative    Review of Systems: See HPI, otherwise negative ROS  Physical Exam: BP 154/96 mmHg  Pulse 61  Temp(Src) 97.9 F (36.6 C) (Temporal)  Resp 18  Ht 5\' 7"  (1.702 m)  Wt 142 lb (64.411 kg)  BMI 22.24 kg/m2  SpO2 98% General:   Alert,  pleasant and cooperative in NAD Head:  Normocephalic and atraumatic. Neck:  Supple; no masses or thyromegaly. Lungs:  Clear throughout to auscultation.    Heart:  Regular rate and rhythm. Abdomen:  Soft, nontender and nondistended. Normal bowel sounds, without guarding, and without rebound.   Neurologic:  Alert and  oriented x4;  grossly normal neurologically.  Impression/Plan: Zachary Brewer is here for an endoscopy to be performed for dysphagia  Risks, benefits, limitations, and alternatives regarding  endoscopy have been reviewed with the patient.  Questions have been answered.  All parties agreeable.   Ollen Bowl, MD  06/20/2015, 7:20 AM

## 2015-06-20 NOTE — Anesthesia Procedure Notes (Signed)
Procedure Name: MAC Performed by: Anairis Knick Pre-anesthesia Checklist: Patient identified, Emergency Drugs available, Suction available, Patient being monitored and Timeout performed Patient Re-evaluated:Patient Re-evaluated prior to inductionOxygen Delivery Method: Nasal cannula Preoxygenation: Pre-oxygenation with 100% oxygen       

## 2015-06-20 NOTE — Progress Notes (Signed)
   06/20/15 1405  Clinical Encounter Type  Visited With Patient not available;Family  Visit Type Initial  Visited with family member in hallway.  Offered pastoral support and presence.  Family member thanked me for my support. Arrie Senate Shemaiah Round-[phone]

## 2015-06-20 NOTE — Progress Notes (Signed)
Report given to Janett Billow, RN on 1C by Caryl Pina, RN.

## 2015-06-20 NOTE — H&P (Signed)
[facility] [place] NAME: Zachary Brewer    MR#:  [id number]  DATE OF BIRTH:  1941/08/31  DATE OF ADMISSION:  (Not on file)  PRIMARY CARE PHYSICIAN: [name], MD   REQUESTING/REFERRING PHYSICIAN:Dr. Allen Norris  CHIEF COMPLAINT:  Hypoxia after EGD  HISTORY OF PRESENT ILLNESS:  Zachary Brewer  is a 74 y.o. male with a known history of gastric outlet obstruction and essential hypertension who presents with above issue. This morning patient underwent endoscopy for severe dysphasia. 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 Stricture was dilated by Dr. Allen Norris. Fluid was suctioned as best as possible. In the PACU patient had room-air sats of 86-88% and was placed on nasal cannula. Patient's saturations did not improve and therefore hospital service was called to make the patient for acute respiratory failure and aspiration pneumonia. His O2 sats are greater than 90% on 4 L oxygen. Chest x-ray shows right lower lobe consolidation with possible mucus plugging. Prior to the procedure patient had preop sats of 98%  PAST MEDICAL HISTORY:   Past Medical History  Diagnosis Date  . Allergy   . GERD (gastroesophageal reflux disease)   . Hyperlipidemia   . Hypertension   . Arthritis 05/30/2015  . 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 Cervical pain 05/30/2015    PAST SURGICAL HISTORY:   Past Surgical History  Procedure Laterality Date  . Gastric outlet obstruction release  2013  . 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 Upper gi endoscopy      SOCIAL HISTORY:   Social History  Substance Use Topics  . Smoking status: Never Smoker   . Smokeless tobacco: Never Used  . Alcohol Use: No    FAMILY HISTORY:   Family History  Problem Relation Age of Onset  . Diabetes Mother   . Heart disease Father   . Diabetes Sister   . Diabetes Brother   . Stroke Brother   . Mental illness Brother   . Hypertension  Paternal Grandfather     DRUG ALLERGIES:  No Known Allergies   REVIEW OF SYSTEMS:  CONSTITUTIONAL: No fever, fatigue or weakness.  EYES: No blurred or double vision.  EARS, NOSE, AND THROAT: No tinnitus or ear pain.  RESPIRATORY: No cough, shortness of breath, wheezing or hemoptysis.  CARDIOVASCULAR: No chest pain, orthopnea, edema.  GASTROINTESTINAL: No nausea, vomiting, diarrhea or abdominal pain. Positive dysphagia GENITOURINARY: No dysuria, hematuria.  ENDOCRINE: No polyuria, nocturia,  HEMATOLOGY: No anemia, easy bruising or bleeding SKIN: No rash or lesion. MUSCULOSKELETAL: No joint pain or arthritis.   NEUROLOGIC: No tingling, numbness, weakness.  PSYCHIATRY: No anxiety or depression.   MEDICATIONS AT HOME:   Prior to Admission medications   Medication Sig Start Date End Date Taking? Authorizing Provider  DOCUSATE SODIUM PO Take by mouth.    Historical Provider, MD  Glucosamine-Chondroit-Vit C-Mn (GLUCOSAMINE CHONDR 1500 COMPLX) CAPS Take by mouth. 07/28/14   Historical Provider, MD  hydrochlorothiazide (HYDRODIURIL) 25 MG tablet Take by mouth. 01/11/15   Historical Provider, MD  lovastatin (MEVACOR) 40 MG tablet Take by mouth. 01/11/15   Historical Provider, MD  MULTIPLE VITAMIN PO Take by mouth.    Historical Provider, MD  NUTRITIONAL SUPPLEMENTS PO Take by mouth.    Historical Provider, MD  omeprazole (PRILOSEC) 20 MG capsule Take by mouth. 01/11/15   Historical Provider, MD  tamsulosin (FLOMAX) 0.4 MG CAPS capsule Take by mouth. 01/11/15  Historical Provider, MD      VITAL SIGNS:  100.9 P 58 RR 16 111/72 82% RA   PHYSICAL EXAMINATION:  GENERAL:  74 y.o.-year-old patient sitting in the bed with no acute distress.  EYES: Pupils equal, round, reactive to light and accommodation. No scleral icterus. Extraocular muscles intact.  HEENT: Head atraumatic, normocephalic. Oropharynx and nasopharynx clear.  NECK:  Supple, no jugular venous distention. No thyroid enlargement,  no tenderness.  LUNGS: decreased breath sounds 1/3 right side with no wheezing or crackles CARDIOVASCULAR: S1, S2 normal. No murmurs, rubs, or gallops.  ABDOMEN: Soft, nontender, nondistended. Bowel sounds present. No organomegaly or mass.  EXTREMITIES: No pedal edema, cyanosis, or clubbing.  NEUROLOGIC: Cranial nerves II through XII are grossly intact. No focal deficits. PSYCHIATRIC: The patient is alert and oriented x 3.  SKIN: No obvious rash, lesion, or ulcer.   LABORATORY PANEL:   CBC No results for input(s): WBC, HGB, HCT, PLT in the last 168 hours. ------------------------------------------------------------------------------------------------------------------  Chemistries  No results for input(s): NA, K, CL, CO2, GLUCOSE, BUN, CREATININE, CALCIUM, MG, AST, ALT, ALKPHOS, BILITOT in the last 168 hours.  Invalid input(s): GFRCGP ------------------------------------------------------------------------------------------------------------------  Cardiac Enzymes No results for input(s): TROPONINI in the last 168 hours. ------------------------------------------------------------------------------------------------------------------  RADIOLOGY:  Dg Chest 1 View  06/20/2015   CLINICAL DATA:  Post endoscopy with low oxygen saturation.  Hypoxia.  EXAM: CHEST  1 VIEW  COMPARISON:  11/14/2013  FINDINGS: Interval development of right lower lobe density most consistent with right lower lobe collapse. Possible small right effusion  Negative for heart failure.  Left lung is clear.  IMPRESSION: Right lower lobe consolidation most consistent with collapse. Possible mucous plugging.   Electronically Signed   By: Franchot Gallo M.D.   On: 06/20/2015 09:36    EKG:    IMPRESSION AND PLAN:  74 year old male who underwent EGD for severe dysphagia and subsequently developed aspiration pneumonia with right lower lobe consolidation and collapse.  1. Acute respiratory failure with hypoxia This is  secondary to aspiration pneumonia with right lower lobe consolidation/collapse likely secondary to mucous plug or food. I will have pulmonary consultation. I will also have speech consultation. I started patient clindamycin. Patient will continue with oxygen and wean as titrated. I also will try DuoNeb's.  2. Essential hypertension: Continue HCTZ  3. Hyperlipidemia: Continue lovastatin   4. BPH: Continue Flomax.  5. Hypok: replete and recheck in am  All the records are reviewed and case discussed with Dr Allen Norris Management plans discussed with the patient and he is in agreement.  CODE STATUS: FULL  TOTAL TIME TAKING CARE OF THIS PATIENT: 45 minutes.    Yvette Roark M.D on 06/20/2015 at 11:10 AM  Between 7am to 6pm - Pager - [phone] After 6pm go to [email or web address] - password [email or web address] [facility]  Office  [phone]  CC: Primary care physician; [name], MD

## 2015-06-21 ENCOUNTER — Encounter: Payer: Self-pay | Admitting: Gastroenterology

## 2015-06-21 DIAGNOSIS — K311 Adult hypertrophic pyloric stenosis: Secondary | ICD-10-CM | POA: Insufficient documentation

## 2015-06-21 DIAGNOSIS — E441 Mild protein-calorie malnutrition: Secondary | ICD-10-CM | POA: Insufficient documentation

## 2015-06-21 LAB — BASIC METABOLIC PANEL
ANION GAP: 9 (ref 5–15)
BUN: 13 mg/dL (ref 6–20)
CO2: 31 mmol/L (ref 22–32)
Calcium: 8.8 mg/dL — ABNORMAL LOW (ref 8.9–10.3)
Chloride: 99 mmol/L — ABNORMAL LOW (ref 101–111)
Creatinine, Ser: 0.84 mg/dL (ref 0.61–1.24)
GFR calc Af Amer: >60 mL/min (ref >60)
GLUCOSE: 113 mg/dL — AB (ref 65–99)
POTASSIUM: 3.5 mmol/L (ref 3.5–5.1)
Sodium: 139 mmol/L (ref 135–145)

## 2015-06-21 LAB — CBC
HEMATOCRIT: 41 % (ref 40.0–52.0)
HEMOGLOBIN: 14.4 g/dL (ref 13.0–18.0)
MCH: 32 pg (ref 26.0–34.0)
MCHC: 35.2 g/dL (ref 32.0–36.0)
MCV: 90.7 fL (ref 80.0–100.0)
Platelets: 140 10*3/uL — ABNORMAL LOW (ref 150–440)
RBC: 4.52 MIL/uL (ref 4.40–5.90)
RDW: 13.5 % (ref 11.5–14.5)
WBC: 11.3 10*3/uL — AB (ref 3.8–10.6)

## 2015-06-21 MED ORDER — POTASSIUM CHLORIDE IN NACL 20-0.9 MEQ/L-% IV SOLN
INTRAVENOUS | Status: DC
  Administered 2015-06-21 – 2015-06-23 (×3): via INTRAVENOUS
  Filled 2015-06-21 (×6): qty 1000

## 2015-06-21 MED ORDER — INFLUENZA VAC SPLIT QUAD 0.5 ML IM SUSY: 0.5000 mL | PREFILLED_SYRINGE | INTRAMUSCULAR | Status: DC

## 2015-06-21 NOTE — Progress Notes (Signed)
Initial Nutrition Assessment  DOCUMENTATION CODES:   Non-severe (moderate) malnutrition in context of chronic illness  INTERVENTION:   Meals and Snacks: Cater to patient preferences Medical Food Supplement Therapy: will send [employer] Instant Breakfast on meal trays as pt likes and drinks outpatient as well as Magic Cup BID for added nutrition.   NUTRITION DIAGNOSIS:   Altered GI function related to chronic illness as evidenced by  (dysphagia I diet order).  GOAL:   Patient will meet greater than or equal to 90% of their needs  MONITOR:    (Energy Intake, Digestive System, Electrolyte and renal Profile)  REASON FOR ASSESSMENT:   Diagnosis    ASSESSMENT:   Pt admitted s/p EGD with dilation of small bowel stricture after which pt with hypoxia likely secondary to mucous plug or food per MD note. 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  Past Medical History  Diagnosis Date  . Allergy   . GERD (gastroesophageal reflux disease)   . Hyperlipidemia   . Hypertension   . Arthritis 05/30/2015  . 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 Cervical pain 05/30/2015   Past Surgical History  Procedure Laterality Date  . Gastric outlet obstruction release  2013  . 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 Upper gi endoscopy    . Esophagogastroduodenoscopy (egd) with propofol N/A 06/20/2015    Procedure: ESOPHAGOGASTRODUODENOSCOPY (EGD) WITH PROPOFOL with dialation;  Surgeon: Lucilla Lame, MD;  Location: [facility];  Service: Endoscopy;  Laterality: N/A;    Diet Order:  DIET - DYS 1 Room service appropriate?: Yes; Fluid consistency:: Thin    Current Nutrition: Pt reports eating pureed waffle this am and tolerating. Pt reports not eating much yesterday as throat was very irritated.   Food/Nutrition-Related History: Pt reports PTA eating pureed foods and tolerating well but starting to eat chopped up foods every now and then. Pt reports not really able to get foods to stay down for the  past 3 weeks, progressively worsening.   Medications: NS with KCl at 29mL/hr, Protonix  Electrolyte/Renal Profile and Glucose Profile:   Recent Labs Lab 06/20/15 1130 06/21/15 0447  NA 141 139  K 3.4* 3.5  CL 96* 99*  CO2 34* 31  BUN 12 13  CREATININE 0.72 0.84  CALCIUM 9.0 8.8*  GLUCOSE 129* 113*   Protein Profile: No results for input(s): ALBUMIN in the last 168 hours.  Gastrointestinal Profile: Last BM:  06/20/2015   Nutrition-Focused Physical Exam Findings:  Nutrition-Focused physical exam completed. Findings are mild-moderate fat depletion, mild-moderate muscle depletion, and no edema.    Weight Change: Pt reports weight gain recently, was weighing 138lbs PTA.     Height:   Ht Readings from Last 1 Encounters:  06/20/15 5\' 7"  (1.702 m)    Weight:   Wt Readings from Last 1 Encounters:  06/20/15 143 lb 4.8 oz (65 kg)   Wt Readings from Last 10 Encounters:  06/20/15 143 lb 4.8 oz (65 kg)  06/20/15 142 lb (64.411 kg)  05/31/15 146 lb (66.225 kg)  05/30/15 144 lb 3.2 oz (65.409 kg)     BMI:  Body mass index is 22.44 kg/(m^2).  Estimated Nutritional Needs:   Kcal:  1907-2254kcals, BEE: 1334kcals, TEE: (IF 1.1-1.3)(AF 1.3)   Protein:  65-78g protein (1.0-1.2g/kg)   Fluid:  1625-1950mL of fluid (25-88mL/kg)  EDUCATION NEEDS:   No education needs identified at this time   [place], RD, LDN Pager [phone]

## 2015-06-21 NOTE — Care Management (Signed)
Patient admitted with Acute respiratory failure with hypoxia .  Patient lives at home alone.   Resides at the [facility]  Patient is currently requiring 3 liters of O2 acutely.  Patient does not require chronic O2, and does not have any how equipment.  Patient drives him self when needed.  If patient requires home O2 at the the time of discharge patient will need qualifying O2 sat and diagnosis.  Will continue to follow for discharge planning

## 2015-06-21 NOTE — Progress Notes (Signed)
[facility] [place] NAME: Zachary Brewer    MR#:  [id number]  DATE OF BIRTH:  [birth year]  SUBJECTIVE:  CHIEF COMPLAINT:  The patient is 74 year old male with past medical history significant for history of gastric outlet obstruction. 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 Allen Norris. During procedure. Patient was noted to have large amount of food in the stomach, which was suctioned and there is significant stenosis and jejunum was noted, which was not traversed with the scope, but dilated. Post procedure patient was noted to have significant hypoxia and was admitted to the hospital with aspiration pneumonitis. CT scan of the chest revealed a right lower and middle lobe atelectasis consolidation and no intraluminal debris. Patient feels comfortable today. No significant cough, phlegm production.   Review of Systems  Constitutional: Positive for weight loss. Negative for fever and chills.  HENT: Negative for congestion.   Eyes: Negative for blurred vision and double vision.  Respiratory: Positive for shortness of breath. Negative for cough, sputum production and wheezing.   Cardiovascular: Negative for chest pain, palpitations, orthopnea, leg swelling and PND.  Gastrointestinal: Negative for nausea, vomiting, abdominal pain, diarrhea, constipation and blood in stool.  Genitourinary: Negative for dysuria, urgency, frequency and hematuria.  Musculoskeletal: Negative for falls.  Neurological: Positive for weakness. Negative for dizziness, tremors, focal weakness and headaches.  Endo/Heme/Allergies: Does not bruise/bleed easily.  Psychiatric/Behavioral: Negative for depression. The patient does not have insomnia.     VITAL SIGNS: Blood pressure 119/59, pulse 96, temperature 98.8 F (37.1 C), temperature source Oral, resp. rate 18, height 5\' 7"  (1.702 m), weight 65 kg (143 lb 4.8 oz),  SpO2 94 %.  PHYSICAL EXAMINATION:   GENERAL:  75 y.o.-year-old patient walking in the room with no acute distress.  EYES: Pupils equal, round, reactive to light and accommodation. No scleral icterus. Extraocular muscles intact.  HEENT: Head atraumatic, normocephalic. Oropharynx and nasopharynx clear.  NECK:  Supple, no jugular venous distention. No thyroid enlargement, no tenderness.  LUNGS: Normal breath sounds bilaterally, except for diminished sounds on the right side. The right upper lobe posteriorly, right midlobe in mid axillary line , no wheezing, rales,rhonchi , few crepitations. No use of accessory muscles of respiration. On 3 L of oxygen through nasal cannula, which is new for him CARDIOVASCULAR: S1, S2 normal. No murmurs, rubs, or gallops.  ABDOMEN: Soft, nontender, nondistended. Bowel sounds present. No organomegaly or mass.  EXTREMITIES: No pedal edema, cyanosis, or clubbing.  NEUROLOGIC: Cranial nerves II through XII are intact. Muscle strength 5/5 in all extremities. Sensation intact. Gait not checked.  PSYCHIATRIC: The patient is alert and oriented x 3.  SKIN: No obvious rash, lesion, or ulcer.   ORDERS/RESULTS REVIEWED:   CBC  Recent Labs Lab 06/20/15 1130 06/21/15 0447  WBC 4.2 11.3*  HGB 15.8 14.4  HCT 45.7 41.0  PLT 157 140*  MCV 91.1 90.7  MCH 31.5 32.0  MCHC 34.6 35.2  RDW 13.5 13.5   ------------------------------------------------------------------------------------------------------------------  Chemistries   Recent Labs Lab 06/20/15 1130 06/21/15 0447  NA 141 139  K 3.4* 3.5  CL 96* 99*  CO2 34* 31  GLUCOSE 129* 113*  BUN 12 13  CREATININE 0.72 0.84  CALCIUM 9.0 8.8*   ------------------------------------------------------------------------------------------------------------------ estimated creatinine clearance is 68.8 mL/min (by C-G formula based on Cr of  0.84). ------------------------------------------------------------------------------------------------------------------ No results for input(s): TSH, T4TOTAL, T3FREE, THYROIDAB in  the last 72 hours.  Invalid input(s): FREET3  Cardiac Enzymes No results for input(s): CKMB, TROPONINI, MYOGLOBIN in the last 168 hours.  Invalid input(s): CK ------------------------------------------------------------------------------------------------------------------ Invalid input(s): POCBNP ---------------------------------------------------------------------------------------------------------------  RADIOLOGY: Dg Chest 1 View  06/20/2015   CLINICAL DATA:  Post endoscopy with low oxygen saturation.  Hypoxia.  EXAM: CHEST  1 VIEW  COMPARISON:  11/14/2013  FINDINGS: Interval development of right lower lobe density most consistent with right lower lobe collapse. Possible small right effusion  Negative for heart failure.  Left lung is clear.  IMPRESSION: Right lower lobe consolidation most consistent with collapse. Possible mucous plugging.   Electronically Signed   By: Franchot Gallo M.D.   On: 06/20/2015 09:36   Ct Chest W Contrast  06/21/2015   CLINICAL DATA:  Aspiration into airway. Hypoxia post upper endoscopy.  EXAM: CT CHEST WITH CONTRAST  TECHNIQUE: Multidetector CT imaging of the chest was performed during intravenous contrast administration.  CONTRAST:  12mL OMNIPAQUE IOHEXOL 350 MG/ML SOLN  COMPARISON:  Chest radiograph earlier this day.  FINDINGS: Elevation of the right hemidiaphragm. There is atelectasis/consolidation with air bronchograms in the right middle and right lower lobes, however no intraluminal debris within the bronchi. Within the left lung are multifocal ground-glass and more consolidative opacities in the lower lobe and perihilar distribution.  The heart size is normal. Atherosclerosis noted of the coronary arteries. The thoracic aorta is normal in caliber with mild calcified and  noncalcified atheromatous plaque. Trace pleural thickening in the medial left hemithorax, no frank pleural effusion. No pericardial effusion. There is no mediastinal or hilar adenopathy.  The esophagus is decompressed.  No pneumomediastinum.  Within the upper abdomen stomach is markedly distended with fluid. There is hepatic steatosis with sparing adjacent to the gallbladder fossa. Gallbladder is mildly distended and contains dependent gallstones.  Degenerative change in spine without acute osseous abnormality.  IMPRESSION: 1. Elevation of right hemidiaphragm with atelectasis/consolidation in the right lower and right middle lobes. No definite intraluminal debris within the bronchi. 2. Multifocal ground-glass and more confluent opacities in the left hemithorax, primarily in the lower lobe and perihilar distribution. This may reflect aspiration given history, infection could have a similar appearance. 3. Coronary artery calcifications. Mild atherosclerosis of thoracic aorta. 4. Evaluation of the upper abdomen demonstrates stomach markedly distended with fluid. Gallbladder appears distended with gallstones. Hepatic steatosis.   Electronically Signed   By: Jeb Levering M.D.   On: 06/21/2015 02:15    EKG:  Orders placed or performed in visit on 11/14/13  . EKG 12-Lead    ASSESSMENT AND PLAN:  Active Problems:   Aspiration pneumonia 1. Acute respiratory failure with hypoxia due to aspiration pneumonitis, continue oxygen therapy, wean off oxygen as tolerated 2. Aspiration pneumonitis, continue clindamycin intravenously. Pulmonary saw patient and recommended CT scan of the chest which was performed. Questionable bronchoscopy, per pulmonary,  although no debris was noted in bronchial tree 3. Hypotension, initiate patient on IV fluids and stop patient's blood pressure medications for time being 4. Hypokalemia. Seems to be resolving 5. Leukocytosis. Follow with therapy 6. Hyperglycemia. Hemoglobin A1c 7.  Gastric outlet obstruction. 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 I talked to Dr. Burt Knack and he or Dr. Pat Patrick will be seeing patient while in the hospital to discuss treatment plan as patient's stenosis is significant and may not be able to be amenable with dilatation alone.     Management plans discussed with the patient, family and they are in agreement.   DRUG  ALLERGIES: No Known Allergies  CODE STATUS:     Code Status Orders        Start     Ordered   06/20/15 1107  Full code   Continuous     06/20/15 1108      TOTAL TIME TAKING CARE OF THIS PATIENT: 45  minutes.  Dr. Burt Knack is informed about patient's presence in the hospital earlier today. Prolonged discussion this patient's daughter about treatment plan, follow-up plan and discharge planning, all questions were answered. Patient's daughter voiced understanding. Time spent approximately 10-15 minutes on discussion with family .  Theodoro Grist M.D on 06/21/2015 at 12:55 PM  Between 7am to 6pm - Pager - [phone]  After 6pm go to [email or web address] - password [email or web address] [facility]  Office  [phone]  CC: Primary care physician; [name], MD

## 2015-06-21 NOTE — Plan of Care (Signed)
Problem: Phase II Progression Outcomes Goal: Discharge plan established Outcome: Progressing Pt is alert and oriented x 4, denies pain, doe, continues on acute oxygen, ambulated with physical therapy, started on IV fluids, surgical consult entered, on pureed diet, fair appetite, wbc elevated, low grade fever, daughter at bedside, order for bronchoscopy on 8/26. Uneventful shift, Reported off care at 15:00.

## 2015-06-21 NOTE — Plan of Care (Signed)
Problem: Discharge Progression Outcomes Goal: Other Discharge Outcomes/Goals Outcome: Progressing Plan of care progress to goals: 1. No c/o pain. Resting comfortably.  2. Hemodynamically:             -VSS, afebrile              -IV Abx given as scheduled              -O2 sats stable on 3L [place], Nebs Q4H             -CT Chest w/ contrast done last night, Pulmonology consulted.  3. Tolerating DYS 1 diet well. 4. +1 assist. Bed alarm on. Pt understand how to use call system for assistance but has urgency to urinate so jumps up to use urinal at times.

## 2015-06-21 NOTE — Consult Note (Signed)
Zachary Brewer is a 74 y.o. male  admitted following an EGD procedure. He has suffered an episode of aspiration and is being treated for aspiration pneumonia.  HPI: He had a significant duodenal obstruction noted back over a year ago. He underwent surgical intervention following upper endoscopy. At surgery the pylorus was uninvolved in the defect appeared to be in the second portion of the duodenum. It did not appear to be a malignant problem. A pyloroplasty was performed to close the pyloric incision and then a Roux and Y gastrojejunostomy was performed.  He did well postoperatively but noted soon afterwards that he began to have similar symptoms of nausea and fullness. He switched to a pured diet which helped with her symptoms for a while but is continue to have significant problems over the last several months. Recent endoscopy didn't demonstrate a very small opening at the gastrojejunostomy site. Dilatation was attempted. The patient was found to have stomach with old food products remaining and it during the endoscopy. The surgical service was consulted.  The etiology of his original obstruction is never been identified.  Past Medical History  Diagnosis Date  . Allergy   . GERD (gastroesophageal reflux disease)   . Hyperlipidemia   . Hypertension   . Arthritis 05/30/2015  . 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 Cervical pain 05/30/2015   Past Surgical History  Procedure Laterality Date  . Gastric outlet obstruction release  2013  . 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 Upper gi endoscopy    . Esophagogastroduodenoscopy (egd) with propofol N/A 06/20/2015    Procedure: ESOPHAGOGASTRODUODENOSCOPY (EGD) WITH PROPOFOL with dialation;  Surgeon: Lucilla Lame, MD;  Location: [facility];  Service: Endoscopy;  Laterality: N/A;   Social History   Social History  . Marital Status: Widowed    Spouse Name: N/A  . Number of Children: N/A  . Years of Education: N/A   Social History Main Topics  .  Smoking status: Never Smoker   . Smokeless tobacco: Never Used  . Alcohol Use: No  . Drug Use: No  . Sexual Activity: Not Asked   Other Topics Concern  . None   Social History Narrative    Review of Systems: Review of Systems  Constitutional: Positive for weight loss and malaise/fatigue.  HENT: Negative.   Eyes: Negative.   Respiratory: Positive for cough and shortness of breath.   Cardiovascular: Negative.   Gastrointestinal: Positive for heartburn, nausea, vomiting and abdominal pain.  Genitourinary: Negative.   Musculoskeletal: Negative.   Skin: Negative.   Neurological: Negative.   Endo/Heme/Allergies: Negative.   Psychiatric/Behavioral: Negative.     PHYSICAL EXAM: BP 105/66 mmHg  Pulse 101  Temp(Src) 100.1 F (37.8 C) (Oral)  Resp 20  Ht 5\' 7"  (1.702 m)  Wt 143 lb 4.8 oz (65 kg)  BMI 22.44 kg/m2  SpO2 94%  Physical Exam  Constitutional: He is oriented to person, place, and time.  Losing weight and mildly cachectic  HENT:  Head: Normocephalic and atraumatic.  Eyes: EOM are normal. Pupils are equal, round, and reactive to light.  Neck: Normal range of motion. Neck supple.  Cardiovascular: Regular rhythm and normal heart sounds.   Pulmonary/Chest: Effort normal and breath sounds normal.  I cannot hear any adventitious sounds or tubular breath sounds.  Abdominal: Soft. Bowel sounds are normal. He exhibits no distension. There is no tenderness. There is no rebound and no guarding.  Musculoskeletal: Normal range of motion. He exhibits no edema.  Neurological: He is  alert and oriented to person, place, and time.  Skin: Skin is warm and dry.  Psychiatric: His behavior is normal. Thought content normal.   His abdominal exam is large unremarkable. Does not have any significant findings on examination.  Impression/Plan: I have reviewed his endoscopy note. To my knowledge has not had a contrast imaging study as yet. His original anastomosis was a 25 mm anastomosis  but the EGD report suggests that has scarred down to a minimal size. Dilatation was attempted. Unclear when he had his pneumonia develop or the etiology of that problem.  We would suggest any recover from his current symptoms. We would recommend following him for a while to see how he does with dilatation and breath consider multiple dilatations. However, if those conservative measures are unsuccessful in solving this problem he may need a revision of his surgery. We will continue to follow him while he is hospitalized.   Dia Crawford III, MD  06/21/2015, 8:41 PM

## 2015-06-21 NOTE — Evaluation (Signed)
Physical Therapy Evaluation Patient Details Name: Zachary Brewer MRN: [id number] DOB: 03/09/1941 Today's Date: 06/21/2015   History of Present Illness  Patient is a pleasant 74 y/o male that presents after endoscopy for severe dysphasia and was noted to be in respiratory failure on room air afterwards.   Clinical Impression  Patient is a pleasant 74 y/o male admitted with shortness of breath after endoscopy. Patient ambulated with no AD today on 2-3L of O2. Patient desaturated on 2L to 85-88%, on 3L he was roughly 95-96%. Patient demonstrated no loss of balance and demonstrated appropriate age norm values for balance outcome measures. At this time patient would benefit from skilled acute PT services to continue to assess his balance as he occasionally shifts his weight to his right side > left (he states this started several months ago).     Follow Up Recommendations No PT follow up    Equipment Recommendations       Recommendations for Other Services       Precautions / Restrictions Restrictions Weight Bearing Restrictions: No      Mobility  Bed Mobility               General bed mobility comments: Patient received in sitting.   Transfers Overall transfer level: Independent               General transfer comment: Patient is able to perform sit to stand with no assistance. 5x sit to stand in 13 seconds.   Ambulation/Gait Ambulation/Gait assistance: Supervision Ambulation Distance (Feet): 530 Feet   Gait Pattern/deviations: WFL(Within Functional Limits);Drifts right/left   Gait velocity interpretation: at or above normal speed for age/gender General Gait Details: Patient ambulates with mild drifting to his right on occasion, though this is very intermittent. No other gait deficits noted.   Stairs            Wheelchair Mobility    Modified Rankin (Stroke Patients Only)       Balance Overall balance assessment: Needs assistance Sitting-balance  support: Feet unsupported;No upper extremity supported Sitting balance-Leahy Scale: Good Sitting balance - Comments: No balance deficits noted.    Standing balance support: No upper extremity supported Standing balance-Leahy Scale: Good Standing balance comment: Patient displays 5x sit to stand in 13 seconds. Modified DGI of 11/12 and able to ambulate short distances with his eyes closed with no deviations.                              Pertinent Vitals/Pain Pain Assessment:  (Patient reports some pain on the lateral aspect of his right thigh and generalized in his abdomen. )    Home Living Family/patient expects to be discharged to:: Private residence Living Arrangements: Alone Available Help at Discharge: Available PRN/intermittently Type of Home: House Home Access: Level entry;Elevator     Home Layout: One level        Prior Function Level of Independence: Independent         Comments: Patient has been independent at home with one "fall" where he lost his balance getting out of bed and bumped into the wall.      Hand Dominance        Extremity/Trunk Assessment   Upper Extremity Assessment: Overall WFL for tasks assessed           Lower Extremity Assessment: Overall WFL for tasks assessed      Cervical / Trunk Assessment: Normal  Communication  Communication: No difficulties  Cognition Arousal/Alertness: Awake/alert Behavior During Therapy: WFL for tasks assessed/performed Overall Cognitive Status: Within Functional Limits for tasks assessed                      General Comments      Exercises        Assessment/Plan    PT Assessment Patient needs continued PT services  PT Diagnosis Difficulty walking   PT Problem List Cardiopulmonary status limiting activity  PT Treatment Interventions Gait training;Therapeutic exercise;Therapeutic activities   PT Goals (Current goals can be found in the Care Plan section) Acute Rehab PT  Goals Patient Stated Goal: To return home when able  PT Goal Formulation: With patient/family Time For Goal Achievement: 07/05/15 Potential to Achieve Goals: Good    Frequency Min 2X/week   Barriers to discharge   Oxygen saturation on room air.     Co-evaluation               End of Session Equipment Utilized During Treatment: Gait belt;Oxygen Activity Tolerance: Patient tolerated treatment well Patient left: in chair;with call bell/phone within reach;with family/visitor present Nurse Communication: Mobility status         Time: 5400-8676 PT Time Calculation (min) (ACUTE ONLY): 20 min   Charges:   PT Evaluation $Initial PT Evaluation Tier I: 1 Procedure     PT G Codes:       Kerman Passey, PT, DPT    06/21/2015, 10:44 AM

## 2015-06-21 NOTE — Consult Note (Signed)
Pulmonary Critical Care  Initial Consult Note   Zachary Brewer:[id number] DOB: January 15, 1941 DOA: 06/20/2015  Referring physician: Bettey Costa, MD PCP: [name], MD   Chief Complaint: Aspiration  HPI: Zachary Brewer is a 74 y.o. male with prior history of GERD HTN HLD presented for a EGD. The patient underwent an EGD for gastric outlet obstruction. Patient was found to have a stricture and residual food in the stomach. The stricture was dilated and stomach contents were attempted to be cleared. Post operatively the patient had desaturations noted and it was felt that he may have aspirated. Patient was started on oxygen and there was improvement of his saturations. I am asked to see the patient for evaluation of aspiration and possible bronchoscopy   Review of Systems:  Constitutional:  No night sweats, +Fevers, no chills, fatigue.  HEENT:  No headaches, nasal congestion, post nasal drip, +throat burning Cardio-vascular:  No chest pain, Orthopnea, PND, swelling in lower extremities  GI:  No heartburn, indigestion, abdominal pain, nausea, vomiting, diarrhea  Resp:  +shortness of breath. no productive cough, No coughing up of blood.No wheezing Skin:  no rash or lesions.  Musculoskeletal:  No joint pain or swelling.   Remainder ROS performed and is unremarkable other than noted in HPI  Past Medical History  Diagnosis Date  . Allergy   . GERD (gastroesophageal reflux disease)   . Hyperlipidemia   . Hypertension   . Arthritis 05/30/2015  . 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 Cervical pain 05/30/2015   Past Surgical History  Procedure Laterality Date  . Gastric outlet obstruction release  2013  . 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 Upper gi endoscopy    . Esophagogastroduodenoscopy (egd) with propofol N/A 06/20/2015    Procedure: ESOPHAGOGASTRODUODENOSCOPY (EGD) WITH PROPOFOL with dialation;  Surgeon: Lucilla Lame, MD;  Location: [facility];  Service: Endoscopy;  Laterality: N/A;    Social History:  reports that he has never smoked. He has never used smokeless tobacco. He reports that he does not drink alcohol or use illicit drugs.  No Known Allergies  Family History  Problem Relation Age of Onset  . Diabetes Mother   . Heart disease Father   . Diabetes Sister   . Diabetes Brother   . Stroke Brother   . Mental illness Brother   . Hypertension Paternal Grandfather     Prior to Admission medications   Medication Sig Start Date End Date Taking? Authorizing Provider  DOCUSATE SODIUM PO Take by mouth.    Historical Provider, MD  Glucosamine-Chondroit-Vit C-Mn (GLUCOSAMINE CHONDR 1500 COMPLX) CAPS Take by mouth. 07/28/14   Historical Provider, MD  hydrochlorothiazide (HYDRODIURIL) 25 MG tablet Take by mouth. 01/11/15   Historical Provider, MD  lovastatin (MEVACOR) 40 MG tablet Take by mouth. 01/11/15   Historical Provider, MD  MULTIPLE VITAMIN PO Take by mouth.    Historical Provider, MD  NUTRITIONAL SUPPLEMENTS PO Take by mouth.    Historical Provider, MD  omeprazole (PRILOSEC) 20 MG capsule Take by mouth. 01/11/15   Historical Provider, MD  tamsulosin (FLOMAX) 0.4 MG CAPS capsule Take by mouth. 01/11/15   Historical Provider, MD   Physical Exam: Filed Vitals:   06/20/15 2057 06/20/15 2103 06/20/15 2115 06/21/15 0504  BP: 78/52 98/54 100/60 92/59  Pulse: 83 80  71  Temp: 98 F (36.7 C)   98.8 F (37.1 C)  TempSrc: Oral   Oral  Resp: 18   18  Height:  Weight:      SpO2: 96% 96%  93%    Wt Readings from Last 3 Encounters:  06/20/15 65 kg (143 lb 4.8 oz)  06/20/15 64.411 kg (142 lb)  05/31/15 66.225 kg (146 lb)    General:  Appears calm and comfortable Eyes: PERRL, normal lids, irises & conjunctiva ENT: grossly normal hearing, lips & tongue Neck: no LAD, masses or thyromegaly Cardiovascular: RRR, no m/r/g. No LE edema. Respiratory: CTA bilaterally, no w/r/r. Normal respiratory effort. Abdomen: soft, nontender Skin: no rash or induration seen on  limited exam Musculoskeletal: grossly normal tone BUE/BLE Psychiatric: grossly normal mood and affect Neurologic: grossly non-focal.          Labs on Admission:  Basic Metabolic Panel:  Recent Labs Lab 06/20/15 1130 06/21/15 0447  NA 141 139  K 3.4* 3.5  CL 96* 99*  CO2 34* 31  GLUCOSE 129* 113*  BUN 12 13  CREATININE 0.72 0.84  CALCIUM 9.0 8.8*   Liver Function Tests: No results for input(s): AST, ALT, ALKPHOS, BILITOT, PROT, ALBUMIN in the last 168 hours. No results for input(s): LIPASE, AMYLASE in the last 168 hours. No results for input(s): AMMONIA in the last 168 hours. CBC:  Recent Labs Lab 06/20/15 1130 06/21/15 0447  WBC 4.2 11.3*  HGB 15.8 14.4  HCT 45.7 41.0  MCV 91.1 90.7  PLT 157 140*   Cardiac Enzymes: No results for input(s): CKTOTAL, CKMB, CKMBINDEX, TROPONINI in the last 168 hours.  BNP (last 3 results) No results for input(s): BNP in the last 8760 hours.  ProBNP (last 3 results) No results for input(s): PROBNP in the last 8760 hours.  CBG: No results for input(s): GLUCAP in the last 168 hours.  Radiological Exams on Admission: Dg Chest 1 View  06/20/2015   CLINICAL DATA:  Post endoscopy with low oxygen saturation.  Hypoxia.  EXAM: CHEST  1 VIEW  COMPARISON:  11/14/2013  FINDINGS: Interval development of right lower lobe density most consistent with right lower lobe collapse. Possible small right effusion  Negative for heart failure.  Left lung is clear.  IMPRESSION: Right lower lobe consolidation most consistent with collapse. Possible mucous plugging.   Electronically Signed   By: Franchot Gallo M.D.   On: 06/20/2015 09:36   Ct Chest W Contrast  06/21/2015   CLINICAL DATA:  Aspiration into airway. Hypoxia post upper endoscopy.  EXAM: CT CHEST WITH CONTRAST  TECHNIQUE: Multidetector CT imaging of the chest was performed during intravenous contrast administration.  CONTRAST:  51mL OMNIPAQUE IOHEXOL 350 MG/ML SOLN  COMPARISON:  Chest radiograph  earlier this day.  FINDINGS: Elevation of the right hemidiaphragm. There is atelectasis/consolidation with air bronchograms in the right middle and right lower lobes, however no intraluminal debris within the bronchi. Within the left lung are multifocal ground-glass and more consolidative opacities in the lower lobe and perihilar distribution.  The heart size is normal. Atherosclerosis noted of the coronary arteries. The thoracic aorta is normal in caliber with mild calcified and noncalcified atheromatous plaque. Trace pleural thickening in the medial left hemithorax, no frank pleural effusion. No pericardial effusion. There is no mediastinal or hilar adenopathy.  The esophagus is decompressed.  No pneumomediastinum.  Within the upper abdomen stomach is markedly distended with fluid. There is hepatic steatosis with sparing adjacent to the gallbladder fossa. Gallbladder is mildly distended and contains dependent gallstones.  Degenerative change in spine without acute osseous abnormality.  IMPRESSION: 1. Elevation of right hemidiaphragm with atelectasis/consolidation in the right  lower and right middle lobes. No definite intraluminal debris within the bronchi. 2. Multifocal ground-glass and more confluent opacities in the left hemithorax, primarily in the lower lobe and perihilar distribution. This may reflect aspiration given history, infection could have a similar appearance. 3. Coronary artery calcifications. Mild atherosclerosis of thoracic aorta. 4. Evaluation of the upper abdomen demonstrates stomach markedly distended with fluid. Gallbladder appears distended with gallstones. Hepatic steatosis.   Electronically Signed   By: Jeb Levering M.D.   On: 06/21/2015 02:15    EKG: Independently reviewed.  Assessment/Plan Active Problems:   Aspiration pneumonia   1. Aspiration Pneumonitis -patient had a CT scan as ordered which shows presence of atelectasis and some consolidation changes there was no food  residue noted however. I will schedule for a bronchoscopy for closer evaluation of the airways and for cultures  -I have discussed the risks and benefits of the bronchoscopy and the patient and the daughter are in favor of proceeding   Code Status: full code Family Communication: daughter Disposition Plan: home   Time spent: 62min    I have personally obtained a history, examined the patient, evaluated laboratory and imaging results, formulated the assessment and plan and placed orders.  The Patient requires high complexity decision making for assessment and support.    Allyne Gee, MD [facility] Pulmonary Critical Care Medicine Sleep Medicine

## 2015-06-21 NOTE — Progress Notes (Signed)
Progression tylenol x1 for  C/o  Rt neck shoulder and  Upper leg pain.  Pt  Reports  Pain dropped to 5. 02 in use   Humidified sol   For 02 d/t  Dry nasal passages and small amt nose bleed.no further nosebleed noted.

## 2015-06-21 NOTE — Op Note (Signed)
[facility] Gastroenterology Patient Name: Zachary Brewer Procedure Date: 06/20/2015 8:00 AM MRN: [id number] Account #: [id number] Date of Birth: 05-29-1941 Admit Type: Outpatient Age: 74 Room: [facility] OR ROOM 01 Gender: Male Note Status: Finalized Procedure:         Upper GI endoscopy Indications:       Dysphagia Providers:         Lucilla Lame, MD Referring MD:      Arlis Porta, MD (Referring MD) Medicines:         Propofol per Anesthesia Complications:     No immediate complications. Procedure:         Pre-Anesthesia Assessment:                    - Prior to the procedure, a History and Physical was                     performed, and patient medications and allergies were                     reviewed. The patient's tolerance of previous anesthesia                     was also reviewed. The risks and benefits of the procedure                     and the sedation options and risks were discussed with the                     patient. All questions were answered, and informed consent                     was obtained. Prior Anticoagulants: The patient has taken                     no previous anticoagulant or antiplatelet agents. ASA                     Grade Assessment: II - A patient with mild systemic                     disease. After reviewing the risks and benefits, the                     patient was deemed in satisfactory condition to undergo                     the procedure.                    After obtaining informed consent, the endoscope was passed                     under direct vision. Throughout the procedure, the                     patient's blood pressure, pulse, and oxygen saturations                     were monitored continuously. The Olympus GIF H180J                     endoscope (S#: [id number]) was introduced through the mouth,  and advanced to the jejunum. The upper GI endoscopy was                     accomplished  without difficulty. The patient tolerated the                     procedure well. Findings:      The examined esophagus was normal.      A large amount of food (residue) was found in the entire examined       stomach.      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 A TTS       dilator was passed through the scope. Dilation with a 10 mm pyloric       balloon dilator was performed.      Evidence of a Roux-en-Y gastrojejunostomy was found. Impression:        - Normal esophagus.                    - A large amount of food (residue) in the stomach.                    - Jejunal stenosis. Dilated.                    - Roux-en-Y gastrojejunostomy.                    - No specimens collected. Recommendation:    - Repeat the upper endoscopy in 3 weeks for retreatment. Procedure Code(s): --- Professional ---                    [phone], Esophagogastroduodenoscopy, flexible, transoral;                     with dilation of gastric/duodenal stricture(s) (eg,                     balloon, bougie) Diagnosis Code(s): --- Professional ---                    R13.10, Dysphagia, unspecified                    K56.69, Other intestinal obstruction CPT copyright 2014 American Medical Association. All rights reserved. The codes documented in this report are preliminary and upon coder review may  be revised to meet current compliance requirements. Lucilla Lame, MD 06/20/2015 8:23:22 AM This report has been signed electronically. Number of Addenda: 0 Note Initiated On: 06/20/2015 8:00 AM Total Procedure Duration: 0 hours 8 minutes 31 seconds       [facility]

## 2015-06-22 ENCOUNTER — Encounter
Admission: AD | Disposition: A | Discharge status: Home / Self Care | Payer: Self-pay | Source: Ambulatory Visit | Attending: Internal Medicine

## 2015-06-22 DIAGNOSIS — R0902 Hypoxemia: Secondary | ICD-10-CM | POA: Diagnosis not present

## 2015-06-22 DIAGNOSIS — R0989 Other specified symptoms and signs involving the circulatory and respiratory systems: Secondary | ICD-10-CM | POA: Diagnosis not present

## 2015-06-22 DIAGNOSIS — T17308A Unspecified foreign body in larynx causing other injury, initial encounter: Secondary | ICD-10-CM | POA: Diagnosis not present

## 2015-06-22 DIAGNOSIS — T17990A Other foreign object in respiratory tract, part unspecified in causing asphyxiation, initial encounter: Secondary | ICD-10-CM | POA: Diagnosis not present

## 2015-06-22 HISTORY — PX: FLEXIBLE BRONCHOSCOPY: SHX5094

## 2015-06-22 SURGERY — BRONCHOSCOPY, FLEXIBLE
Anesthesia: Moderate Sedation

## 2015-06-22 MED ORDER — LIDOCAINE HCL 2 % EX GEL
1.0000 "application " | Freq: Once | CUTANEOUS | Status: DC
  Filled 2015-06-22: qty 5

## 2015-06-22 MED ORDER — FENTANYL CITRATE (PF) 100 MCG/2ML IJ SOLN
INTRAMUSCULAR | Status: AC
  Administered 2015-06-22: 12:00:00
  Filled 2015-06-22: qty 4

## 2015-06-22 MED ORDER — PHENYLEPHRINE HCL 0.25 % NA SOLN
1.0000 | Freq: Four times a day (QID) | NASAL | Status: DC | PRN
  Filled 2015-06-22: qty 15

## 2015-06-22 MED ORDER — FENTANYL CITRATE (PF) 100 MCG/2ML IJ SOLN
INTRAMUSCULAR | Status: AC | PRN
  Administered 2015-06-22 (×2): 50 ug via INTRAVENOUS

## 2015-06-22 MED ORDER — SODIUM CHLORIDE 0.9 % IV SOLN: Freq: Once | INTRAVENOUS | Status: DC

## 2015-06-22 MED ORDER — MIDAZOLAM HCL 5 MG/5ML IJ SOLN
INTRAMUSCULAR | Status: AC
  Administered 2015-06-22: 12:00:00
  Filled 2015-06-22: qty 10

## 2015-06-22 MED ORDER — BUTAMBEN-TETRACAINE-BENZOCAINE 2-2-14 % EX AERO
1.0000 | INHALATION_SPRAY | Freq: Once | CUTANEOUS | Status: DC
  Filled 2015-06-22: qty 20

## 2015-06-22 MED ORDER — MIDAZOLAM HCL 2 MG/2ML IJ SOLN
INTRAMUSCULAR | Status: AC | PRN
  Administered 2015-06-22: 1 mg via INTRAVENOUS
  Administered 2015-06-22: 10:00:00 2 mg via INTRAVENOUS

## 2015-06-22 MED ORDER — SODIUM CHLORIDE 0.9 % IV SOLN
INTRAVENOUS | Status: DC
  Administered 2015-06-22: 10:00:00 via INTRAVENOUS

## 2015-06-22 NOTE — Progress Notes (Signed)
Bronchoscopy performed. Had diffuse inflammation noted in the airways through out the bronchial tree. Mucus plug was removed from the left side and some from the right side. No mass was noted in the visualized segments. Would suggest continue antibiotics and nebs. May need oxygen evaluation prior to discharge. Would follow up in the office in a week after discharge.  Allyne Gee, MD

## 2015-06-22 NOTE — Progress Notes (Signed)
Speech Therapy Note: reviewed chart notes; consulted MD/NSG. NSG denied any deficits w/ the current pureed diet and meds crushed in puree. Rec. continue w/ current diet as ordered d/t pt's GI status and dysmotility. Rec. general aspiration and Reflux precautions. NSG to reconsult ST services if any decline in status noted.

## 2015-06-22 NOTE — Plan of Care (Signed)
Problem: Discharge Progression Outcomes Goal: Other Discharge Outcomes/Goals Outcome: Progressing Plan of care progress to goals: 1. C/o arthritis leg pain relieved by PRN Vicodin after minimal relief of Tylenol given right before shift change.  2. Hemodynamically:             -VSS, afebrile               -IV Abx given as scheduled               -O2 sats stable on 3L [place], Nebs Q4H 3. Tolerating DYS 1 diet well. 4. +1 assist. Bed alarm on. Pt understand how to use call system for assistance but has urgency to urinate so jumps up to use urinal at times.

## 2015-06-22 NOTE — Progress Notes (Signed)
[facility] [place] NAME: Zachary Brewer    MR#:  [id number]  DATE OF BIRTH:  1940/12/24  SUBJECTIVE:  CHIEF COMPLAINT:  The patient is 74 year old male with past medical history significant for history of gastric outlet obstruction. 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 Allen Norris. During procedure. Patient was noted to have large amount of food in the stomach, which was suctioned and there is significant stenosis and jejunum was noted, which was not traversed with the scope, but dilated. Post procedure patient was noted to have significant hypoxia and was admitted to the hospital with aspiration pneumonitis. CT scan of the chest revealed a right lower and middle lobe atelectasis consolidation and no intraluminal debris. Patient feels comfortable today. No significant cough, phlegm production. Patient feels satisfactory today. Does not lift up much sputum. He underwent bronchoscopy by Dr. Humphrey Rolls on 06/22/2015 and had a mucous plug removed from his left as well as right side. Some shortness of breath, remains on 3 L of oxygen through nasal cannula oxygenation is satisfactory up to 97%.   Review of Systems  Constitutional: Positive for weight loss. Negative for fever and chills.  HENT: Negative for congestion.   Eyes: Negative for blurred vision and double vision.  Respiratory: Positive for shortness of breath. Negative for cough, sputum production and wheezing.   Cardiovascular: Negative for chest pain, palpitations, orthopnea, leg swelling and PND.  Gastrointestinal: Negative for nausea, vomiting, abdominal pain, diarrhea, constipation and blood in stool.  Genitourinary: Negative for dysuria, urgency, frequency and hematuria.  Musculoskeletal: Negative for falls.  Neurological: Positive for weakness. Negative for dizziness, tremors, focal weakness and headaches.  Endo/Heme/Allergies:  Does not bruise/bleed easily.  Psychiatric/Behavioral: Negative for depression. The patient does not have insomnia.     VITAL SIGNS: Blood pressure 111/59, pulse 88, temperature 99.3 F (37.4 C), temperature source Oral, resp. rate 23, height 5\' 7"  (1.702 m), weight 65 kg (143 lb 4.8 oz), SpO2 92 %.  PHYSICAL EXAMINATION:   GENERAL:  74 y.o.-year-old patient walking in the room with no acute distress.  EYES: Pupils equal, round, reactive to light and accommodation. No scleral icterus. Extraocular muscles intact.  HEENT: Head atraumatic, normocephalic. Oropharynx and nasopharynx clear.  NECK:  Supple, no jugular venous distention. No thyroid enlargement, no tenderness.  LUNGS: Normal breath sounds bilaterally, except for diminished sounds on the right side. The right upper lobe posteriorly, right midlobe in mid axillary line , no wheezing, rales,rhonchi , few crepitations. No use of accessory muscles of respiration. On 3 L of oxygen through nasal cannula, which is new for him CARDIOVASCULAR: S1, S2 normal. No murmurs, rubs, or gallops.  ABDOMEN: Soft, nontender, nondistended. Bowel sounds present. No organomegaly or mass.  EXTREMITIES: No pedal edema, cyanosis, or clubbing.  NEUROLOGIC: Cranial nerves II through XII are intact. Muscle strength 5/5 in all extremities. Sensation intact. Gait not checked.  PSYCHIATRIC: The patient is alert and oriented x 3.  SKIN: No obvious rash, lesion, or ulcer.   ORDERS/RESULTS REVIEWED:   CBC  Recent Labs Lab 06/20/15 1130 06/21/15 0447  WBC 4.2 11.3*  HGB 15.8 14.4  HCT 45.7 41.0  PLT 157 140*  MCV 91.1 90.7  MCH 31.5 32.0  MCHC 34.6 35.2  RDW 13.5 13.5   ------------------------------------------------------------------------------------------------------------------  Chemistries   Recent Labs Lab 06/20/15 1130 06/21/15 0447  NA 141 139  K 3.4* 3.5  CL 96* 99*  CO2 34* 31  GLUCOSE 129* 113*  BUN 12 13  CREATININE 0.72 0.84   CALCIUM 9.0 8.8*   ------------------------------------------------------------------------------------------------------------------ estimated creatinine clearance is 68.8 mL/min (by C-G formula based on Cr of 0.84). ------------------------------------------------------------------------------------------------------------------ No results for input(s): TSH, T4TOTAL, T3FREE, THYROIDAB in the last 72 hours.  Invalid input(s): FREET3  Cardiac Enzymes No results for input(s): CKMB, TROPONINI, MYOGLOBIN in the last 168 hours.  Invalid input(s): CK ------------------------------------------------------------------------------------------------------------------ Invalid input(s): POCBNP ---------------------------------------------------------------------------------------------------------------  RADIOLOGY: Ct Chest W Contrast  06/21/2015   CLINICAL DATA:  Aspiration into airway. Hypoxia post upper endoscopy.  EXAM: CT CHEST WITH CONTRAST  TECHNIQUE: Multidetector CT imaging of the chest was performed during intravenous contrast administration.  CONTRAST:  17mL OMNIPAQUE IOHEXOL 350 MG/ML SOLN  COMPARISON:  Chest radiograph earlier this day.  FINDINGS: Elevation of the right hemidiaphragm. There is atelectasis/consolidation with air bronchograms in the right middle and right lower lobes, however no intraluminal debris within the bronchi. Within the left lung are multifocal ground-glass and more consolidative opacities in the lower lobe and perihilar distribution.  The heart size is normal. Atherosclerosis noted of the coronary arteries. The thoracic aorta is normal in caliber with mild calcified and noncalcified atheromatous plaque. Trace pleural thickening in the medial left hemithorax, no frank pleural effusion. No pericardial effusion. There is no mediastinal or hilar adenopathy.  The esophagus is decompressed.  No pneumomediastinum.  Within the upper abdomen stomach is markedly distended with  fluid. There is hepatic steatosis with sparing adjacent to the gallbladder fossa. Gallbladder is mildly distended and contains dependent gallstones.  Degenerative change in spine without acute osseous abnormality.  IMPRESSION: 1. Elevation of right hemidiaphragm with atelectasis/consolidation in the right lower and right middle lobes. No definite intraluminal debris within the bronchi. 2. Multifocal ground-glass and more confluent opacities in the left hemithorax, primarily in the lower lobe and perihilar distribution. This may reflect aspiration given history, infection could have a similar appearance. 3. Coronary artery calcifications. Mild atherosclerosis of thoracic aorta. 4. Evaluation of the upper abdomen demonstrates stomach markedly distended with fluid. Gallbladder appears distended with gallstones. Hepatic steatosis.   Electronically Signed   By: Jeb Levering M.D.   On: 06/21/2015 02:15    EKG:  Orders placed or performed in visit on 11/14/13  . EKG 12-Lead    ASSESSMENT AND PLAN:  Active Problems:   Aspiration pneumonia   Malnutrition of moderate degree   Gastric outlet obstruction 1. Acute respiratory failure with hypoxia due to aspiration pneumonitis, continue oxygen therapy, wean off oxygen as tolerated, remains on the same. 3 L of oxygen through nasal cannula, may need oxygen therapy at home. We will be giving prescription for oxygen therapy at home.  2. Aspiration pneumonitis, unclear timeframe, however, suspected recurrent aspirations of her period of time, status post bronchoscopy with mucous plug removal from the left as well as small from the right side on the 26th of August 2016 by Dr. Humphrey Rolls,  continue clindamycin intravenously. Awaiting for sputum cultures, although suspect aspiration pneumonitis 3. Hypotension, resolved on IV fluids and holding patient's blood pressure medications for time being 4. Hypokalemia. Resolved 5. Leukocytosis. Follow with therapy 6.  Hyperglycemia. Hemoglobin A1c tomorrow morning 7. 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 Appreciated Dr. Rolin Barry input. Surgery if conservative therapy fails. Patient is being continued on full liquid diet and does not seem to be having anymore regurgitations, follow him closely clinically and  repeat chest x-ray if needed. Discussed this patient's daughter today  Management plans discussed with the patient, family and they are in agreement.   DRUG ALLERGIES: No Known Allergies  CODE STATUS:     Code Status Orders        Start     Ordered   06/20/15 1107  Full code   Continuous     06/20/15 1108      TOTAL TIME TAKING CARE OF THIS PATIENT: 35 minutes.   . Discussed with care management in regards to home oxygen use and order is written for home oxygen.   Theodoro Grist M.D on 06/22/2015 at 1:50 PM  Between 7am to 6pm - Pager - [phone]  After 6pm go to [email or web address] - password [email or web address] [facility]  Office  [phone]  CC: Primary care physician; [name], MD

## 2015-06-22 NOTE — Op Note (Signed)
[facility] Patient Name: Zachary Brewer Procedure Date: 06/22/2015 9:45 AM MRN: [id number] Account #: [id number] Date of Birth: 01-06-1941 Admit Type: Inpatient Age: 74 Room: [facility] PROCEDURE RM 01 Gender: Male Note Status: Finalized Attending MD: Allyne Gee, MD Procedure:         Bronchoscopy Indications:       Bronchus foreign body, Hypoxemia, Mucous plug Providers:         Allyne Gee, MD Referring MD:       Medicines:         Midazolam 3 mg mg IV, Fentanyl 970 mcg IV Complications:     No immediate complications Procedure:         Pre-Anesthesia Assessment:                    - A History and Physical has been performed. The patient's                     medications, allergies and sensitivities have been                     reviewed.                    - The risks and benefits of the procedure and the sedation                     options and risks were discussed with the patient. All                     questions were answered and informed consent was obtained.                    - Pre-procedure physical examination revealed no                     contraindications to sedation.                    - ASA Grade Assessment: II - A patient with mild systemic                     disease.                    - After reviewing the risks and benefits, the patient was                     deemed in satisfactory condition to undergo the procedure.                    - The anesthesia plan was to use moderate                     sedation/analgesia.                    - Immediately prior to administration of medications, the                     patient was re-assessed for adequacy to receive sedatives.                    - The heart rate, respiratory rate, oxygen saturations,                     blood pressure, adequacy of pulmonary ventilation, and  response to care were monitored throughout the procedure.                    - The physical status of the  patient was re-assessed after                     the procedure.                    After obtaining informed consent, the bronchoscope was                     passed under direct vision. Throughout the procedure, the                     patient's blood pressure, pulse, and oxygen saturations                     were monitored continuously. the Bronchoscope Olympus                     BF-Q180 S# [id number] was introduced through the right                     nostril and advanced to the tracheobronchial tree of both                     lungs. The procedure was accomplished without difficulty.                     The patient tolerated the procedure well. The total                     duration of the procedure was 26 minutes. The procedure                     was accomplished without difficulty. The patient tolerated                     the procedure well. Findings:      Bilateral Lung Abnormalities: Erythema was found throughout the       tracheobronchial tree. Edema was found in the right upper lobe. Mucous,       plugging the airway, was found in the left lower lobe. The mucous was       notable, tenacious, yellow and thick. The underlying mucosa is       erythematous. Washings were obtained in the left lower lobe of the lung       and sent for cell count, bacterial culture, viral smears & culture, and       fungal & AFB analysis. The return was mucopurulent. Multiple specimens       were obtained and pooled into one specimen, which was sent for analysis. Impression:        - Erythema was present throughout the tracheobronchial                     tree.                    - Edema was present in the right upper lobe.                    - A mucous plug was found in the left lower lobe.                    -  Washings were obtained. Recommendation:    - Await culture and washing results. Devona Konig, MD Allyne Gee, MD 06/22/2015 10:40:59 AM This report has been signed electronically. Number  of Addenda: 0 Note Initiated On: 06/22/2015 9:45 AM      [facility]

## 2015-06-22 NOTE — Progress Notes (Signed)
PT Cancellation Note  Patient Details Name: Zachary Brewer MRN: [id number] DOB: [birth year]   Cancelled Treatment:    Reason Eval/Treat Not Completed: Other (comment) (Treatment attempted.  Per primary RN, patient just completed ambulation around nursing station (x3) with no significant safety concerns noted.  As result, treatment session deferred this date.  May attempt one additional follow up session to address higher-level balance deficits (as noted in eval); if goals met, will discharge skilled PT.)   Shenice Dolder H. Owens Shark, PT, DPT, NCS 06/22/2015, 2:31 PM [phone]

## 2015-06-22 NOTE — Sedation Documentation (Addendum)
PT desat with sedation 02 adjusted by resp. Staff present and MD aware, responded to adjustment 100%

## 2015-06-22 NOTE — Plan of Care (Signed)
Problem: Discharge Progression Outcomes Goal: Discharge plan in place and appropriate Individualization of Care Pt prefers to be called Mr. Nurse Pt has a history of GERD, hyperlipidemia, HTN, arthritis, lipoma of the neck, gastric outlet obstruction release on home meds HIGH fall precautions    Goal: Other Discharge Outcomes/Goals Plan of Care Progress to Goal:  Pt had bronch today without complications, pt on room air at this time tolerated well, pt ambulated around nurses station without o2 tolerated well, o2 sats remained above 90 %

## 2015-06-22 NOTE — Care Management Important Message (Signed)
Important Message  Patient Details  Name: Zachary Brewer MRN: [id number] Date of Birth: March 30, 1941   Medicare Important Message Given:  Yes-second notification given    Juliann Pulse A Allmond 06/22/2015, 9:30 AM

## 2015-06-22 NOTE — Sedation Documentation (Signed)
POBE out at 1034 by Dr,Khan

## 2015-06-23 LAB — EXPECTORATED SPUTUM ASSESSMENT W REFEX TO RESP CULTURE

## 2015-06-23 LAB — HEMOGLOBIN A1C: HEMOGLOBIN A1C: 5.4 % (ref 4.0–6.0)

## 2015-06-23 LAB — EXPECTORATED SPUTUM ASSESSMENT W GRAM STAIN, RFLX TO RESP C

## 2015-06-23 NOTE — Progress Notes (Signed)
[place] at [place] NAME: Zachary Brewer    MR#:  [id number]  DATE OF BIRTH:  08-13-1941  SUBJECTIVE:  CHIEF COMPLAINT:  The patient is 74 year old male with past medical history significant for history of gastric outlet obstruction. 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 Allen Norris. During procedure. Patient was noted to have large amount of food in the stomach, which was suctioned and there is significant stenosis and jejunum was noted, which was not traversed with the scope, but dilated. Post procedure patient was noted to have significant hypoxia and was admitted to the hospital with aspiration pneumonitis. CT scan of the chest revealed a right lower and middle lobe atelectasis consolidation and no intraluminal debris . Had post bronchoscopy yesterday. mucous plugs removed, had diffuse inflammation of the airways. No mass observed. Patient complains of  less cough today.  . Review of Systems  Constitutional: Positive for weight loss. Negative for fever and chills.  HENT: Negative for congestion.   Eyes: Negative for blurred vision and double vision.  Respiratory: Positive for shortness of breath. Negative for cough, sputum production and wheezing.   Cardiovascular: Negative for chest pain, palpitations, orthopnea, leg swelling and PND.  Gastrointestinal: Negative for nausea, vomiting, abdominal pain, diarrhea, constipation and blood in stool.  Genitourinary: Negative for dysuria, urgency, frequency and hematuria.  Musculoskeletal: Negative for falls.  Neurological: Positive for weakness. Negative for dizziness, tremors, focal weakness and headaches.  Endo/Heme/Allergies: Does not bruise/bleed easily.  Psychiatric/Behavioral: Negative for depression. The patient does not have insomnia.     VITAL SIGNS: Blood pressure 117/72, pulse 71, temperature 98.4 F (36.9 C), temperature  source Oral, resp. rate 18, height 5\' 7"  (1.702 m), weight 65 kg (143 lb 4.8 oz), SpO2 93 %.  PHYSICAL EXAMINATION:   GENERAL:  74 y.o.-year-old patient walking in the room with no acute distress.  EYES: Pupils equal, round, reactive to light and accommodation. No scleral icterus. Extraocular muscles intact.  HEENT: Head atraumatic, normocephalic. Oropharynx and nasopharynx clear.  NECK:  Supple, no jugular venous distention. No thyroid enlargement, no tenderness.  LUNGS: Normal breath sounds bilaterally, except for diminished sounds on the right side. The right upper lobe posteriorly, right midlobe in mid axillary line , no wheezing, rales,rhonchi , few crepitations. No use of accessory muscles of respiration. On 3 L of oxygen through nasal cannula, which is new for him CARDIOVASCULAR: S1, S2 normal. No murmurs, rubs, or gallops.  ABDOMEN: Soft, nontender, nondistended. Bowel sounds present. No organomegaly or mass.  EXTREMITIES: No pedal edema, cyanosis, or clubbing.  NEUROLOGIC: Cranial nerves II through XII are intact. Muscle strength 5/5 in all extremities. Sensation intact. Gait not checked.  PSYCHIATRIC: The patient is alert and oriented x 3.  SKIN: No obvious rash, lesion, or ulcer.   ORDERS/RESULTS REVIEWED:   CBC  Recent Labs Lab 06/20/15 1130 06/21/15 0447  WBC 4.2 11.3*  HGB 15.8 14.4  HCT 45.7 41.0  PLT 157 140*  MCV 91.1 90.7  MCH 31.5 32.0  MCHC 34.6 35.2  RDW 13.5 13.5   ------------------------------------------------------------------------------------------------------------------  Chemistries   Recent Labs Lab 06/20/15 1130 06/21/15 0447  NA 141 139  K 3.4* 3.5  CL 96* 99*  CO2 34* 31  GLUCOSE 129* 113*  BUN 12 13  CREATININE 0.72 0.84  CALCIUM 9.0 8.8*   ------------------------------------------------------------------------------------------------------------------ estimated creatinine clearance is 68.8 mL/min (by C-G formula based  on Cr of  0.84). ------------------------------------------------------------------------------------------------------------------ No results for input(s): TSH, T4TOTAL, T3FREE, THYROIDAB in the last 72 hours.  Invalid input(s): FREET3  Cardiac Enzymes No results for input(s): CKMB, TROPONINI, MYOGLOBIN in the last 168 hours.  Invalid input(s): CK ------------------------------------------------------------------------------------------------------------------ Invalid input(s): POCBNP ---------------------------------------------------------------------------------------------------------------  RADIOLOGY: No results found.  EKG:  Orders placed or performed in visit on 11/14/13  . EKG 12-Lead    ASSESSMENT AND PLAN:  Active Problems:   Aspiration pneumonia   Malnutrition of moderate degree   Gastric outlet obstruction 1. Acute respiratory failure with hypoxia due to aspiration pneumonitis, continue oxygen therapy, may need home oxygen. Continue IV clindamycin.  2. Aspiration pneumonitis, continue clindamycin intravenously. Pulmonary saw patient and recommended CT scan of the chest which was performed. Questionable bronchoscopy, per pulmonary,  although no debris was noted in bronchial tree, status post bronchoscopy with mucous plugging removal on the left side and also on the right side. Continue IV clindamycin. Patient need home oxygen. Will evaluate for that. 3. Hypotension, improved.  4. Hypokalemia. Seems to be resolving 5. Leukocytosis. Follow low Closely. 6. Hyperglycemia. Hemoglobin A1c 7. Gastric outlet obstruction. 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 I talked to Dr. Burt Knack and he or Dr. Pat Patrick will be seeing patient while in the hospital to discuss treatment plan as patient's stenosis is significant and may not be able to be amenable with dilatation alone.   Nutrition patient is tolerating the pured diet. Clearly discharged to home once the bronchial wash cultures results are  final.  Management plans discussed with the patient, family and they are in agreement.   DRUG ALLERGIES: No Known Allergies  CODE STATUS:     Code Status Orders        Start     Ordered   06/20/15 1107  Full code   Continuous     06/20/15 1108      TOTAL TIME TAKING CARE OF THIS PATIENT: 35  minutes.   Epifanio Lesches M.D on 06/23/2015 at 9:02 AM  Between 7am to 6pm - Pager - [phone]  After 6pm go to [email or web address] - password [email or web address] [place] [facility]  Office  [phone]  CC: Primary care physician; [name], MD

## 2015-06-23 NOTE — Plan of Care (Signed)
Problem: Discharge Progression Outcomes Goal: Other Discharge Outcomes/Goals Outcome: Progressing Plan of Care Progress to Goal:   Pt report not feeling well towards the end of the shift. Pt report the ginger ale helped. Pt is currently sitting in chair. Pt denies pain. VSS. No other signs of distress noted. Will continue to monitor.

## 2015-06-24 LAB — CBC
HCT: 37.3 % — ABNORMAL LOW (ref 40.0–52.0)
HEMOGLOBIN: 12.6 g/dL — AB (ref 13.0–18.0)
MCH: 31.2 pg (ref 26.0–34.0)
MCHC: 33.8 g/dL (ref 32.0–36.0)
MCV: 92.5 fL (ref 80.0–100.0)
Platelets: 160 10*3/uL (ref 150–440)
RBC: 4.03 MIL/uL — AB (ref 4.40–5.90)
RDW: 14 % (ref 11.5–14.5)
WBC: 6.7 10*3/uL (ref 3.8–10.6)

## 2015-06-24 MED ORDER — SODIUM CHLORIDE 0.9 % IJ SOLN
3.0000 mL | Freq: Two times a day (BID) | INTRAMUSCULAR | Status: DC
  Administered 2015-06-24 – 2015-06-25 (×3): 3 mL via INTRAVENOUS

## 2015-06-24 MED ORDER — SODIUM CHLORIDE 0.9 % IJ SOLN
3.0000 mL | INTRAMUSCULAR | Status: DC | PRN
  Administered 2015-06-24: 3 mL via INTRAVENOUS
  Filled 2015-06-24: qty 10

## 2015-06-24 NOTE — Plan of Care (Signed)
Problem: Discharge Progression Outcomes Goal: Other Discharge Outcomes/Goals Outcome: Progressing 1.Lives at home in an apartment alone- expecting to return home. 2. VSS. Weaning 02- now at 2l/[place] and tolerating well. Labs stable. 3. IVF's discontinued with pt encouraged and provided extra water to continue fluid intake. IV Cleocin continued. Sputum "more like spit" and reporting it is clearing and thinner. Up in room and tolerating well; performed self care this a.m. Refused bed alarm with education done-still declines. 4. Tolerating dysphagia diet with no reported swallowing difficulties; denies pain, n/v. 5. Pt anticipating discharge home in next day or tow.

## 2015-06-24 NOTE — Plan of Care (Signed)
Problem: Discharge Progression Outcomes Goal: Other Discharge Outcomes/Goals Outcome: Progressing Plan of Care Progress to Goal:   Pt denies pain. VSS. Pt is sitting up in chair for better comfort. No other signs of distress noted. Will continue to monitor.

## 2015-06-24 NOTE — Progress Notes (Addendum)
error 

## 2015-06-24 NOTE — Progress Notes (Signed)
[place] at [place] NAME: Zachary Brewer    MR#:  [id number]  DATE OF BIRTH:  [birth year]  SUBJECTIVE:  CHIEF COMPLAINT:  The patient is 74 year old male with past medical history significant for history of gastric outlet obstruction. 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 Allen Norris. During procedure. Patient was noted to have large amount of food in the stomach, which was suctioned and there is significant stenosis and jejunum was noted, which was not traversed with the scope, but dilated. Post procedure patient was noted to have significant hypoxia and was admitted to the hospital with aspiration pneumonitis. CT scan of the chest revealed a right lower and middle lobe atelectasis consolidation and no intraluminal debris . Had post bronchoscopy . mucous plugs removed, had diffuse inflammation of the airways. No mass observed.  HEENT today, patient to has less cough. No shortness of breath. Spoke With patient's daughter at bedside. . Review of Systems  Constitutional: Positive for weight loss. Negative for fever and chills.  HENT: Negative for congestion.   Eyes: Negative for blurred vision and double vision.  Respiratory: Positive for shortness of breath. Negative for cough, sputum production and wheezing.   Cardiovascular: Negative for chest pain, palpitations, orthopnea, leg swelling and PND.  Gastrointestinal: Negative for nausea, vomiting, abdominal pain, diarrhea, constipation and blood in stool.  Genitourinary: Negative for dysuria, urgency, frequency and hematuria.  Musculoskeletal: Negative for falls.  Neurological: Positive for weakness. Negative for dizziness, tremors, focal weakness and headaches.  Endo/Heme/Allergies: Does not bruise/bleed easily.  Psychiatric/Behavioral: Negative for depression. The patient does not have insomnia.     VITAL SIGNS: Blood pressure  111/71, pulse 64, temperature 98.8 F (37.1 C), temperature source Oral, resp. rate 20, height 5\' 7"  (1.702 m), weight 65 kg (143 lb 4.8 oz), SpO2 92 %.  PHYSICAL EXAMINATION:   GENERAL:  74 y.o.-year-old patient walking in the room with no acute distress.  EYES: Pupils equal, round, reactive to light and accommodation. No scleral icterus. Extraocular muscles intact.  HEENT: Head atraumatic, normocephalic. Oropharynx and nasopharynx clear.  NECK:  Supple, no jugular venous distention. No thyroid enlargement, no tenderness.  LUNGS: Normal breath sounds bilaterally, except for diminished sounds on the right side. The right upper lobe posteriorly, right midlobe in mid axillary line , no wheezing, rales,rhonchi , few crepitations. No use of accessory muscles of respiration. On 3 L of oxygen through nasal cannula, which is new for him CARDIOVASCULAR: S1, S2 normal. No murmurs, rubs, or gallops.  ABDOMEN: Soft, nontender, nondistended. Bowel sounds present. No organomegaly or mass.  EXTREMITIES: No pedal edema, cyanosis, or clubbing.  NEUROLOGIC: Cranial nerves II through XII are intact. Muscle strength 5/5 in all extremities. Sensation intact. Gait not checked.  PSYCHIATRIC: The patient is alert and oriented x 3.  SKIN: No obvious rash, lesion, or ulcer.   ORDERS/RESULTS REVIEWED:   CBC  Recent Labs Lab 06/20/15 1130 06/21/15 0447 06/24/15 0535  WBC 4.2 11.3* 6.7  HGB 15.8 14.4 12.6*  HCT 45.7 41.0 37.3*  PLT 157 140* 160  MCV 91.1 90.7 92.5  MCH 31.5 32.0 31.2  MCHC 34.6 35.2 33.8  RDW 13.5 13.5 14.0   ------------------------------------------------------------------------------------------------------------------  Chemistries   Recent Labs Lab 06/20/15 1130 06/21/15 0447  NA 141 139  K 3.4* 3.5  CL 96* 99*  CO2 34* 31  GLUCOSE 129* 113*  BUN 12 13  CREATININE 0.72 0.84  CALCIUM 9.0 8.8*    ------------------------------------------------------------------------------------------------------------------ estimated creatinine clearance is 68.8 mL/min (by C-G formula based on Cr of 0.84). ------------------------------------------------------------------------------------------------------------------ No results for input(s): TSH, T4TOTAL, T3FREE, THYROIDAB in the last 72 hours.  Invalid input(s): FREET3  Cardiac Enzymes No results for input(s): CKMB, TROPONINI, MYOGLOBIN in the last 168 hours.  Invalid input(s): CK ------------------------------------------------------------------------------------------------------------------ Invalid input(s): POCBNP ---------------------------------------------------------------------------------------------------------------  RADIOLOGY: No results found.  EKG:  Orders placed or performed in visit on 11/14/13  . EKG 12-Lead    ASSESSMENT AND PLAN:  Active Problems:   Aspiration pneumonia   Malnutrition of moderate degree   Gastric outlet obstruction 1. Acute respiratory failure with hypoxia due to aspiration pneumonitis, continue oxygen therapy, may need home oxygen. Continue IV clindamycin.  2. Aspiration pneumonitis, continue clindamycin intravenously. Pulmonary saw patient and recommended CT scan of the chest which was performed. s/p bronchoscopy, per pulmonary,  status post bronchoscopy with mucous plugging removal on the left side and also on the right side. Continue IV clindamycin. Patient need home oxygen. Bronchial culture  results are pending  3. Hypotension, improved. Discontinue IV fluids. 4. Hypokalemia. Seems to be resolving 5. Leukocytosis. Resolved. 6. Hyperglycemia. Hemoglobin A1c is 5.4. 7. Gastric outlet obstruction. 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 I talked to Dr. Burt Knack and he or Dr. Pat Patrick will be seeing patient while in the hospital to discuss treatment plan as patient's stenosis is significant and  may not be able to be amenable with dilatation alone.  Continue pured diet. Nutrition patient is tolerating the pured diet. Management plans discussed with the patient, family and they are in agreement. Discussed with the daughter, possible discharge tomorrow home with home oxygen. DRUG ALLERGIES: No Known Allergies  CODE STATUS:     Code Status Orders        Start     Ordered   06/20/15 1107  Full code   Continuous     06/20/15 1108      TOTAL TIME TAKING CARE OF THIS PATIENT: 35  minutes.   Epifanio Lesches M.D on 06/24/2015 at 11:16 AM  Between 7am to 6pm - Pager - [phone]  After 6pm go to [email or web address] - password [email or web address] [place] [facility]  Office  [phone]  CC: Primary care physician; [name], MD

## 2015-06-25 MED ORDER — ALBUTEROL SULFATE HFA 108 (90 BASE) MCG/ACT IN AERS: 2.0000 | INHALATION_SPRAY | Freq: Four times a day (QID) | RESPIRATORY_TRACT | Status: DC | PRN

## 2015-06-25 MED ORDER — CLINDAMYCIN HCL 300 MG PO CAPS: 300.0000 mg | ORAL_CAPSULE | Freq: Four times a day (QID) | ORAL | Status: DC

## 2015-06-25 NOTE — Progress Notes (Signed)
Nutrition Follow-up  DOCUMENTATION CODES:   Non-severe (moderate) malnutrition in context of chronic illness  INTERVENTION:   Meals and Snacks: Cater to patient preferences Medical Food Supplement Therapy: Continue as ordered   NUTRITION DIAGNOSIS:   Altered GI function related to chronic illness as evidenced by  (dysphagia I diet order).  GOAL:   Patient will meet greater than or equal to 90% of their needs; ongoing  MONITOR:    (Energy Intake, Digestive System, Electrolyte and renal Profile)  ASSESSMENT:   Pt s/p dilation of small bowel stricture and bronch secondary to mucous plug. Per RN Leana Roe pt likely d/c today; pt dressed in clothes walking the unit on visit.   Diet Order:  DIET - DYS 1 Room service appropriate?: Yes; Fluid consistency:: Thin    Current Nutrition: Pt reports tolerating pureeds well, only small amount of oatmeal this am as it 'was too thick.' Recorded po intake 66% of meals on average since admission.    Gastrointestinal Profile: Last BM: 06/24/2015   Medications: Protonix  Electrolyte/Renal Profile and Glucose Profile:   Recent Labs Lab 06/20/15 1130 06/21/15 0447  NA 141 139  K 3.4* 3.5  CL 96* 99*  CO2 34* 31  BUN 12 13  CREATININE 0.72 0.84  CALCIUM 9.0 8.8*  GLUCOSE 129* 113*   Protein Profile: No results for input(s): ALBUMIN in the last 168 hours.   Weight Trend since Admission: Filed Weights   06/20/15 1238  Weight: 143 lb 4.8 oz (65 kg)     BMI:  Body mass index is 22.44 kg/(m^2).  Estimated Nutritional Needs:   Kcal:  1907-2254kcals, BEE: 1334kcals, TEE: (IF 1.1-1.3)(AF 1.3)   Protein:  65-78g protein (1.0-1.2g/kg)   Fluid:  1625-1930mL of fluid (25-57mL/kg)  EDUCATION NEEDS:   No education needs identified at this time    [place]ters, RD, LDN Pager [phone]

## 2015-06-25 NOTE — Care Management (Signed)
Admitted to thgeis facility with the diagnosis of aspiration pneumonia. Lives alone in an apartment complex that has a security system. Daughter is Archie Endo [phone]) She works at [employer]. Seen Dr. Larene Beach about a month ago. No home Health. No skilled facility. No home oxygen. A cane and walker are available, if needed. Takes care of all activities of daily living himself, still drives. Good appetite, usually purees his food at home. One accidental fall . Daughter will transport. Physical therapy evaluation competed. No follow-up needs. Shelbie Ammons RN MSN Care Management [phone]

## 2015-06-25 NOTE — Progress Notes (Signed)
Pt and daughter given d/c instructions r/t activity, diet, followup care and medications, voiced understanding, pt d/c home via wheelchair escorted by family and auxillary

## 2015-06-26 ENCOUNTER — Telehealth: Payer: Self-pay | Admitting: Family Medicine

## 2015-06-26 NOTE — Telephone Encounter (Signed)
A representative from [facility] called with a discharge notification for pt.  He was admitted to [facility] after being diagnosed with pneumonitis due to inhaling fluid and vomit.

## 2015-06-26 NOTE — Telephone Encounter (Signed)
FYI

## 2015-06-26 NOTE — Telephone Encounter (Signed)
[id number] 

## 2015-06-27 LAB — CULTURE, RESPIRATORY W GRAM STAIN

## 2015-06-27 LAB — CULTURE, RESPIRATORY

## 2015-06-28 LAB — CULTURE, RESPIRATORY W GRAM STAIN

## 2015-06-28 LAB — CULTURE, RESPIRATORY: SPECIAL REQUESTS: NORMAL

## 2015-06-29 NOTE — Discharge Summary (Signed)
Zachary Brewer, is a 74 y.o. male  DOB 11/02/1940  MRN [id number].  Admission date:  06/20/2015  Admitting Physician  Zachary Costa, MD  Discharge Date:  06/25/2015   Primary MD  Zachary Doe, MD  Recommendations for primary care physician for things to follow:     Admission Diagnosis  Aspiration Pneumonia Pneumonia  Room 107   Discharge Diagnosis  Aspiration Pneumonia Pneumonia  Room 107    Active Problems:   Aspiration pneumonia   Malnutrition of moderate degree   Gastric outlet obstruction      Past Medical History  Diagnosis Date  . Allergy   . GERD (gastroesophageal reflux disease)   . Hyperlipidemia   . Hypertension   . Arthritis 05/30/2015  . 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 Cervical pain 05/30/2015    Past Surgical History  Procedure Laterality Date  . Gastric outlet obstruction release  2013  . 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 Upper gi endoscopy    . Esophagogastroduodenoscopy (egd) with propofol N/A 06/20/2015    Procedure: ESOPHAGOGASTRODUODENOSCOPY (EGD) WITH PROPOFOL with dialation;  Surgeon: Lucilla Lame, MD;  Location: [facility];  Service: Endoscopy;  Laterality: N/A;       History of present illness and  Hospital Course:     Kindly see H&P for history of present illness and admission details, please review complete Labs, Consult reports and Test reports for all details in brief  HPI  from the history and physical done on the day of admission  74 year old male patient with gastric outlet obstruction, hypertension, had EGD on August 24 by Dr. Allen Norris . 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 Allen Norris .Postprocedure patient found to have severe hypoxia and chest x-ray showed right middle lobe atelectasis and consolidation. Patient admitted to medical service for possible  aspiration pneumonia. Patient had history  Of pyloric obstruction  a year ago underwent surgical intervention with pyloroplasty postoperatively patient to the continue to have nausea and fullness recently. 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 Patient was hypoxic with O2 sats 86-88% on room air, admitted to medicine because of hypoxic respiratory failure.  Hospital Course   #1 hypoxic respiratory failure secondary to aspiration pneumonia. Patient had a started on clindamycin. Situation improved. Patient had a bronchoscopy done by Dr. Heidi Dach. Bronchoscopy showed marked throughout the bronchial tree, edema on the right upper lip. Thick yellow tenacious mucous removed from lower lip. And sent to lab for bacterial and viral and AFB analysis. 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 She'll receive clindamycin value was here and discharge home with clindamycin. Culture results are available after the discharge of patient. Oxygen at [facility] improved and did not qualify for home oxygen. Cultures also showed Enterobacter and also Candida glabrata. 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 Continued on pured diet. Advised to continue.puree diet  till follows up with GI.   3.moderate malnutrition context of chronic illness. Continue dysphagia diet. Seen by registered dietitian.    Follow UP  Follow-up Information    Follow up with Ollen Bowl, MD. Go on 07/03/2015.   Specialty:  Gastroenterology   Why:  at 1:15 at [facility] location   Contact information:   [address] [place] [address] [phone]         Discharge Instructions  and  Discharge Medications        Medication List    TAKE these medications        albuterol 108 (90 BASE) MCG/ACT inhaler  Commonly known as:  PROVENTIL HFA;VENTOLIN HFA   Inhale 2 puffs into the lungs every 6 (six) hours as needed for wheezing or shortness of breath.     clindamycin 300 MG capsule  Commonly known as:  CLEOCIN  Take 1 capsule (300 mg total) by mouth 4 (four) times daily.     DOCUSATE SODIUM PO  Take by mouth.     GLUCOSAMINE CHONDR [place]  Take by mouth.     hydrochlorothiazide 25 MG tablet  Commonly known as:  HYDRODIURIL  Take by mouth.     lovastatin 40 MG tablet  Commonly known as:  MEVACOR  Take by mouth.     MULTIPLE VITAMIN PO  Take by mouth.     NUTRITIONAL SUPPLEMENTS PO  Take by mouth.     omeprazole 20 MG capsule  Commonly known as:  PRILOSEC  Take by mouth.     tamsulosin 0.4 MG Caps capsule  Commonly known as:  FLOMAX  Take by mouth.          Diet and Activity recommendation: See Discharge Instructions above   Consults obtained - GI, surgery   Major procedures and Radiology Reports - PLEASE review detailed and final reports for all details, in brief -      Dg Chest 1 View  06/20/2015   CLINICAL DATA:  Post endoscopy with low oxygen saturation.  Hypoxia.  EXAM: CHEST  1 VIEW  COMPARISON:  11/14/2013  FINDINGS: Interval development of right lower lobe density most consistent with right lower lobe collapse. Possible small right effusion  Negative for heart failure.  Left lung is clear.  IMPRESSION: Right lower lobe consolidation most consistent with collapse. Possible mucous plugging.   Electronically Signed   By: Franchot Gallo M.D.   On: 06/20/2015 09:36   Ct Chest W Contrast  06/21/2015   CLINICAL DATA:  Aspiration into airway. Hypoxia post upper endoscopy.  EXAM: CT CHEST WITH CONTRAST  TECHNIQUE: Multidetector CT imaging of the chest was performed during intravenous contrast administration.  CONTRAST:  78mL OMNIPAQUE IOHEXOL 350 MG/ML SOLN  COMPARISON:  Chest radiograph earlier this day.  FINDINGS: Elevation of the right hemidiaphragm. There is atelectasis/consolidation with air  bronchograms in the right middle and right lower lobes, however no intraluminal debris within the bronchi. Within the left lung are multifocal ground-glass and more consolidative opacities in the lower lobe and perihilar distribution.  The heart size is normal. Atherosclerosis noted of the coronary arteries. The thoracic aorta is normal in caliber with mild calcified and noncalcified atheromatous plaque. Trace pleural thickening in the medial left hemithorax, no frank pleural effusion. No pericardial effusion. There is no mediastinal or hilar adenopathy.  The esophagus is decompressed.  No pneumomediastinum.  Within the upper abdomen stomach is markedly distended with fluid. There is hepatic steatosis with sparing adjacent to the gallbladder fossa. Gallbladder is mildly distended and contains dependent gallstones.  Degenerative change in spine without acute osseous abnormality.  IMPRESSION: 1. Elevation of right hemidiaphragm with atelectasis/consolidation in the right lower and right middle lobes. No definite intraluminal debris within the bronchi. 2. Multifocal ground-glass and more confluent opacities in the left hemithorax, primarily in the lower lobe and perihilar distribution. This may reflect aspiration given history, infection could have a similar appearance. 3. Coronary artery calcifications. Mild  atherosclerosis of thoracic aorta. 4. Evaluation of the upper abdomen demonstrates stomach markedly distended with fluid. Gallbladder appears distended with gallstones. Hepatic steatosis.   Electronically Signed   By: Jeb Levering M.D.   On: 06/21/2015 02:15    Micro Results    Recent Results (from the past 240 hour(s))  Culture, expectorated sputum-assessment     Status: None   Collection Time: 06/22/15  8:37 AM  Result Value Ref Range Status   Specimen Description EXPECTORATED SPUTUM  Final   Special Requests NONE  Final   Sputum evaluation THIS SPECIMEN IS ACCEPTABLE FOR SPUTUM CULTURE  Final    Report Status 06/23/2015 FINAL  Final  Culture, respiratory (NON-Expectorated)     Status: None   Collection Time: 06/22/15  8:37 AM  Result Value Ref Range Status   Specimen Description EXPECTORATED SPUTUM  Final   Special Requests NONE Reflexed from [id number]  Final   Gram Stain   Final    FEW WBC SEEN RARE GRAM NEGATIVE RODS MANY GRAM POSITIVE COCCI    Culture   Final    HEAVY GROWTH HAEMOPHILUS PARAINFLUENZAE BETA LACTAMASE NEGATIVE LIGHT GROWTH ENTEROBACTER AEROGENES    Report Status 06/27/2015 FINAL  Final   Organism ID, Bacteria ENTEROBACTER AEROGENES  Final      Susceptibility   Enterobacter aerogenes - MIC*    CEFTAZIDIME Value in next row Sensitive      SENSITIVE<=1    CEFEPIME Value in next row Sensitive      SENSITIVE<=1    CEFTRIAXONE Value in next row Sensitive      SENSITIVE<=1    CIPROFLOXACIN Value in next row Sensitive      SENSITIVE<=0.25    GENTAMICIN Value in next row Sensitive      SENSITIVE<=1    IMIPENEM Value in next row Sensitive      SENSITIVE<=0.25    TRIMETH/SULFA Value in next row Sensitive      SENSITIVE<=20    * LIGHT GROWTH ENTEROBACTER AEROGENES  Fungus Culture with Smear     Status: None (Preliminary result)   Collection Time: 06/22/15 11:24 AM  Result Value Ref Range Status   Specimen Description BRONCHIAL WASHINGS  Final   Special Requests Normal  Final   Culture CANDIDA GLABRATA  Final   Report Status PENDING  Incomplete  Culture, respiratory (NON-Expectorated)     Status: None   Collection Time: 06/22/15 11:24 AM  Result Value Ref Range Status   Specimen Description BRONCHIAL WASHINGS  Final   Special Requests Normal  Final   Gram Stain   Final    GOOD SPECIMEN - 80-90% WBCS FEW WBC SEEN MANY GRAM POSITIVE COCCI RARE GRAM NEGATIVE RODS    Culture   Final    HEAVY GROWTH HAEMOPHILUS PARAINFLUENZAE HEAVY GROWTH HAEMOPHILUS INFLUENZAE BETA LACTAMASE NEGATIVE    Report Status 06/28/2015 FINAL  Final       Today    Subjective:   Kandee Keen today has no headache,no chest abdominal pain,no new weakness tingling or numbness, feels much better wants to go home today.   Objective:   Blood pressure 108/60, pulse 69, temperature 98.6 F (37 C), temperature source Oral, resp. rate 18, height 5\' 7"  (1.702 m), weight 65 kg (143 lb 4.8 oz), SpO2 92 %.  No intake or output data in the 24 hours ending 06/29/15 0706  Exam Awake Alert, Oriented x 3, No new F.N deficits, Normal affect [place].AT,PERRAL Supple Neck,No JVD, No cervical lymphadenopathy appriciated.  Symmetrical Chest wall  movement, Good air movement bilaterally, CTAB RRR,No Gallops,Rubs or new Murmurs, No Parasternal Heave +ve B.Sounds, Abd Soft, Non tender, No organomegaly appriciated, No rebound -guarding or rigidity. No Cyanosis, Clubbing or edema, No new Rash or bruise  Data Review   CBC w Diff:  Lab Results  Component Value Date   WBC 6.7 06/24/2015   WBC 8.7 11/25/2013   HGB 12.6* 06/24/2015   HGB 13.4 11/25/2013   HCT 37.3* 06/24/2015   HCT 38.2* 11/25/2013   PLT 160 06/24/2015   PLT 161 11/25/2013   LYMPHOPCT 6.8 11/25/2013   MONOPCT 6.3 11/25/2013   EOSPCT 0.2 11/25/2013   BASOPCT 0.3 11/25/2013    CMP:  Lab Results  Component Value Date   NA 139 06/21/2015   NA 138 11/27/2013   K 3.5 06/21/2015   K 3.7 11/27/2013   CL 99* 06/21/2015   CL 102 11/27/2013   CO2 31 06/21/2015   CO2 31 11/27/2013   BUN 13 06/21/2015   BUN 12 11/27/2013   CREATININE 0.84 06/21/2015   CREATININE 0.71 11/27/2013   PROT 6.7 11/14/2013   ALBUMIN 3.4 11/14/2013   BILITOT 0.5 11/14/2013   ALKPHOS 91 11/14/2013   AST 41* 11/14/2013   ALT 69 11/14/2013  .   Total Time in preparing paper work, data evaluation and todays exam - 41 minutes  Emarion Toral M.D on 06/25/2015 at 7:06 AM

## 2015-07-03 ENCOUNTER — Encounter: Payer: Self-pay | Admitting: Gastroenterology

## 2015-07-03 ENCOUNTER — Ambulatory Visit (INDEPENDENT_AMBULATORY_CARE_PROVIDER_SITE_OTHER): Payer: Medicare Other | Admitting: Gastroenterology

## 2015-07-03 VITALS — BP 120/61 | HR 73 | Temp 98.1°F | Ht 67.0 in | Wt 137.0 lb

## 2015-07-03 DIAGNOSIS — K5669 Other intestinal obstruction: Secondary | ICD-10-CM

## 2015-07-03 DIAGNOSIS — R1314 Dysphagia, pharyngoesophageal phase: Secondary | ICD-10-CM | POA: Diagnosis not present

## 2015-07-03 DIAGNOSIS — K56699 Other intestinal obstruction unspecified as to partial versus complete obstruction: Secondary | ICD-10-CM

## 2015-07-03 NOTE — Progress Notes (Signed)
Primary Care Physician: [name], MD  Primary Gastroenterologist:  Dr. Lucilla Lame  Chief Complaint  Patient presents with  . Follow-up    Hospital/Go over EGD    HPI: Zachary Brewer is a 74 y.o. 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 The patient then had aspiration of his stomach contents and spent a few days in the hospital. The patient states he lost approximately 10 pounds but has gained back 1 pounds of that since getting out of the hospital. The patient also states that he feels much better after having dilation of the stricture. He states he is burping much less but still on a pured diet.  Current Outpatient Prescriptions  Medication Sig Dispense Refill  . albuterol (PROVENTIL HFA;VENTOLIN HFA) 108 (90 BASE) MCG/ACT inhaler Inhale 2 puffs into the lungs every 6 (six) hours as needed for wheezing or shortness of breath. 1 Inhaler 2  . Glucosamine-Chond[place]-Vit C-Mn (GLUCOSAMINE CHONDR 1500 COMPLX) CAPS Take by mouth.    . hydrochlorothiazide (HYDRODIURIL) 25 MG tablet Take by mouth.    . lovastatin (MEVACOR) 40 MG tablet Take by mouth.    . MULTIPLE VITAMIN PO Take by mouth.    . NUTRITIONAL SUPPLEMENTS PO Take by mouth.    Marland Kitchen omeprazole (PRILOSEC) 20 MG capsule Take by mouth.    . tamsulosin (FLOMAX) 0.4 MG CAPS capsule Take by mouth.    . DOCUSATE SODIUM PO Take by mouth.     No current facility-administered medications for this visit.    Allergies as of 07/03/2015  . (No Known Allergies)    ROS:  General: Negative for anorexia, weight loss, fever, chills, fatigue, weakness. ENT: Negative for hoarseness, difficulty swallowing , nasal congestion. CV: Negative for chest pain, angina, palpitations, dyspnea on exertion, peripheral edema.  Respiratory: Negative for dyspnea at rest, dyspnea on exertion, cough, sputum, wheezing.  GI: See history of present illness. GU:  Negative for dysuria,  hematuria, urinary incontinence, urinary frequency, nocturnal urination.  Endo: Negative for unusual weight change.    Physical Examination:   BP 120/61 mmHg  Pulse 73  Temp(Src) 98.1 F (36.7 C) (Oral)  Ht 5\' 7"  (1.702 m)  Wt 137 lb (62.143 kg)  BMI 21.45 kg/m2  General: Well-nourished, well-developed in no acute distress.  Eyes: No icterus. Conjunctivae pink. Mouth: Oropharyngeal mucosa moist and pink , no lesions erythema or exudate. Lungs: Clear to auscultation bilaterally. Non-labored. Heart: Regular rate and rhythm, no murmurs rubs or gallops.  Abdomen: Bowel sounds are normal, nontender, nondistended, no hepatosplenomegaly or masses, no abdominal bruits or hernia , no rebound or guarding.   Extremities: No lower extremity edema. No clubbing or deformities. Neuro: Alert and oriented x 3.  Grossly intact. Skin: Warm and dry, no jaundice.   Psych: Alert and cooperative, normal mood and affect.  Labs:    Imaging Studies: Dg Chest 1 View  06/20/2015   CLINICAL DATA:  Post endoscopy with low oxygen saturation.  Hypoxia.  EXAM: CHEST  1 VIEW  COMPARISON:  11/14/2013  FINDINGS: Interval development of right lower lobe density most consistent with right lower lobe collapse. Possible small right effusion  Negative for heart failure.  Left lung is clear.  IMPRESSION: Right lower lobe consolidation most consistent with collapse. Possible mucous plugging.   Electronically Signed   By: Franchot Gallo M.D.   On: 06/20/2015 09:36   Ct Chest W Contrast  06/21/2015   CLINICAL  DATA:  Aspiration into airway. Hypoxia post upper endoscopy.  EXAM: CT CHEST WITH CONTRAST  TECHNIQUE: Multidetector CT imaging of the chest was performed during intravenous contrast administration.  CONTRAST:  8mL OMNIPAQUE IOHEXOL 350 MG/ML SOLN  COMPARISON:  Chest radiograph earlier this day.  FINDINGS: Elevation of the right hemidiaphragm. There is atelectasis/consolidation with air bronchograms in the right middle and  right lower lobes, however no intraluminal debris within the bronchi. Within the left lung are multifocal ground-glass and more consolidative opacities in the lower lobe and perihilar distribution.  The heart size is normal. Atherosclerosis noted of the coronary arteries. The thoracic aorta is normal in caliber with mild calcified and noncalcified atheromatous plaque. Trace pleural thickening in the medial left hemithorax, no frank pleural effusion. No pericardial effusion. There is no mediastinal or hilar adenopathy.  The esophagus is decompressed.  No pneumomediastinum.  Within the upper abdomen stomach is markedly distended with fluid. There is hepatic steatosis with sparing adjacent to the gallbladder fossa. Gallbladder is mildly distended and contains dependent gallstones.  Degenerative change in spine without acute osseous abnormality.  IMPRESSION: 1. Elevation of right hemidiaphragm with atelectasis/consolidation in the right lower and right middle lobes. No definite intraluminal debris within the bronchi. 2. Multifocal ground-glass and more confluent opacities in the left hemithorax, primarily in the lower lobe and perihilar distribution. This may reflect aspiration given history, infection could have a similar appearance. 3. Coronary artery calcifications. Mild atherosclerosis of thoracic aorta. 4. Evaluation of the upper abdomen demonstrates stomach markedly distended with fluid. Gallbladder appears distended with gallstones. Hepatic steatosis.   Electronically Signed   By: Jeb Levering M.D.   On: 06/21/2015 02:15    Assessment and Plan:   Zachary Brewer is a 74 y.o. 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 The patient had aspiration pneumonia after aspirating during the procedure. The patient had a large amount of food in his stomach at the time of the endoscopy. The patient states he is feeling much better but he would like to let his lungs get a little  better before he undergoes the procedure again. The patient has taken my number and business card and will contact us when he is ready to undergo an upper endoscopy. The patient will be intubated at the time of the next upper endoscopy. The patient has been explained the plan and agrees with it.   Note: This dictation was prepared with Dragon dictation along with smaller phrase technology. Any transcriptional errors that result from this process are unintentional.

## 2015-07-06 ENCOUNTER — Encounter: Payer: Self-pay | Admitting: Internal Medicine

## 2015-07-13 ENCOUNTER — Ambulatory Visit: Payer: Medicare Other | Admitting: Family Medicine

## 2015-07-13 LAB — FUNGUS CULTURE W SMEAR: Special Requests: NORMAL

## 2015-07-24 ENCOUNTER — Encounter: Payer: Self-pay | Admitting: Family Medicine

## 2015-07-24 ENCOUNTER — Ambulatory Visit (INDEPENDENT_AMBULATORY_CARE_PROVIDER_SITE_OTHER): Payer: Medicare Other | Admitting: Family Medicine

## 2015-07-24 ENCOUNTER — Ambulatory Visit: Payer: Self-pay | Admitting: Family Medicine

## 2015-07-24 VITALS — BP 127/79 | HR 84 | Temp 98.1°F | Resp 16 | Ht 67.0 in | Wt 140.8 lb

## 2015-07-24 DIAGNOSIS — J4520 Mild intermittent asthma, uncomplicated: Secondary | ICD-10-CM

## 2015-07-24 DIAGNOSIS — N138 Other obstructive and reflux uropathy: Secondary | ICD-10-CM | POA: Insufficient documentation

## 2015-07-24 DIAGNOSIS — E785 Hyperlipidemia, unspecified: Secondary | ICD-10-CM | POA: Diagnosis not present

## 2015-07-24 DIAGNOSIS — R634 Abnormal weight loss: Secondary | ICD-10-CM

## 2015-07-24 DIAGNOSIS — K2100 Gastro-esophageal reflux disease with esophagitis, without bleeding: Secondary | ICD-10-CM

## 2015-07-24 DIAGNOSIS — E440 Moderate protein-calorie malnutrition: Secondary | ICD-10-CM | POA: Diagnosis not present

## 2015-07-24 DIAGNOSIS — Z23 Encounter for immunization: Secondary | ICD-10-CM | POA: Diagnosis not present

## 2015-07-24 DIAGNOSIS — N400 Enlarged prostate without lower urinary tract symptoms: Secondary | ICD-10-CM

## 2015-07-24 DIAGNOSIS — N401 Enlarged prostate with lower urinary tract symptoms: Secondary | ICD-10-CM

## 2015-07-24 DIAGNOSIS — K311 Adult hypertrophic pyloric stenosis: Secondary | ICD-10-CM | POA: Diagnosis not present

## 2015-07-24 DIAGNOSIS — I10 Essential (primary) hypertension: Secondary | ICD-10-CM | POA: Diagnosis not present

## 2015-07-24 MED ORDER — TAMSULOSIN HCL 0.4 MG PO CAPS: 0.4000 mg | ORAL_CAPSULE | Freq: Every day | ORAL | Status: DC

## 2015-07-24 MED ORDER — LOVASTATIN 40 MG PO TABS: 20.0000 mg | ORAL_TABLET | Freq: Every day | ORAL | Status: DC

## 2015-07-24 MED ORDER — OMEPRAZOLE 20 MG PO CPDR: 20.0000 mg | DELAYED_RELEASE_CAPSULE | Freq: Two times a day (BID) | ORAL | Status: DC

## 2015-07-24 MED ORDER — HYDROCHLOROTHIAZIDE 25 MG PO TABS: 25.0000 mg | ORAL_TABLET | Freq: Every day | ORAL | Status: DC

## 2015-07-24 MED ORDER — ALBUTEROL SULFATE HFA 108 (90 BASE) MCG/ACT IN AERS: 1.0000 | INHALATION_SPRAY | Freq: Four times a day (QID) | RESPIRATORY_TRACT | Status: DC | PRN

## 2015-07-24 NOTE — Progress Notes (Signed)
Name: Zachary Brewer   MRN: [id number]    DOB: 1941/01/19   Date:07/24/2015       Progress Note  Subjective  Chief Complaint  Chief Complaint  Patient presents with  . Abdominal Pain    pain is little but has hard time digesting food has operation and it collapsed     HPI  Had gastric olutlet obstruction dilated last month, but had aspiration and hosp. For aspiration pneumonia.  Over that.  Still needs more dilitation of obstruction.  He is awaiting family member help to schedule this with Dr. Allen Norris.  Still c/o abd. Pain and distention and frequent BMs. No problem-specific assessment & plan notes found for this encounter.   Past Medical History  Diagnosis Date  . Allergy   . GERD (gastroesophageal reflux disease)   . Hyperlipidemia   . Hypertension   . Arthritis 05/30/2015  . 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 Cervical pain 05/30/2015    Social History  Substance Use Topics  . Smoking status: Never Smoker   . Smokeless tobacco: Never Used  . Alcohol Use: No     Current outpatient prescriptions:  .  albuterol (PROVENTIL HFA;VENTOLIN HFA) 108 (90 BASE) MCG/ACT inhaler, Inhale 1-2 puffs into the lungs every 6 (six) hours as needed for wheezing or shortness of breath., Disp: 1 Inhaler, Rfl: 12 .  DOCUSATE SODIUM PO, Take by mouth., Disp: , Rfl:  .  Glucosamine-Chondroit-Vit C-Mn (GLUCOSAMINE CHONDR 1500 COMPLX) CAPS, Take by mouth., Disp: , Rfl:  .  hydrochlorothiazide (HYDRODIURIL) 25 MG tablet, Take 1 tablet (25 mg total) by mouth daily., Disp: 30 tablet, Rfl: 12 .  lovastatin (MEVACOR) 40 MG tablet, Take 0.5 tablets (20 mg total) by mouth at bedtime., Disp: 30 tablet, Rfl: 12 .  MULTIPLE VITAMIN PO, Take by mouth., Disp: , Rfl:  .  NUTRITIONAL SUPPLEMENTS PO, Take by mouth., Disp: , Rfl:  .  omeprazole (PRILOSEC) 20 MG capsule, Take 1 capsule (20 mg total) by mouth 2 (two) times daily before a meal., Disp: 60 capsule, Rfl: 12 .  tamsulosin (FLOMAX) 0.4 MG CAPS capsule, Take 1 capsule  (0.4 mg total) by mouth daily after breakfast., Disp: 30 capsule, Rfl: 12  No Known Allergies  Review of Systems  Constitutional: Positive for weight loss. Negative for fever, chills and malaise/fatigue.  Eyes: Negative for blurred vision and double vision.  Respiratory: Negative for cough, sputum production, shortness of breath and wheezing.   Cardiovascular: Positive for leg swelling (on and off). Negative for chest pain, palpitations and orthopnea.  Gastrointestinal: Positive for heartburn, nausea, abdominal pain and diarrhea (frequent BMs, soft.). Negative for blood in stool.  Genitourinary: Negative for dysuria, urgency and frequency.  Neurological: Negative for weakness and headaches.      Objective  Filed Vitals:   07/24/15 0919  BP: 127/79  Pulse: 84  Temp: 98.1 F (36.7 C)  TempSrc: Oral  Resp: 16  Height: '5\' 7"'  (1.702 m)  Weight: 140 lb 12.8 oz (63.866 kg)     Physical Exam  Constitutional: He is oriented to person, place, and time and well-developed, well-nourished, and in no distress. No distress.  HENT:  Head: Normocephalic and atraumatic.  Cardiovascular: Normal rate, regular rhythm and normal heart sounds.  Exam reveals no gallop and no friction rub.   No murmur heard. Pulmonary/Chest: Breath sounds normal. No respiratory distress. He has no wheezes. He has no rales.  Abdominal: Soft. Bowel sounds are normal. He exhibits no distension and no mass.  There is no tenderness.  Musculoskeletal: He exhibits no edema.  Neurological: He is alert and oriented to person, place, and time.      Recent Results (from the past 2160 hour(s))  CBC     Status: None   Collection Time: 06/20/15 11:30 AM  Result Value Ref Range   WBC 4.2 3.8 - 10.6 K/uL   RBC 5.01 4.40 - 5.90 MIL/uL   Hemoglobin 15.8 13.0 - 18.0 g/dL   HCT 45.7 40.0 - 52.0 %   MCV 91.1 80.0 - 100.0 fL   MCH 31.5 26.0 - 34.0 pg   MCHC 34.6 32.0 - 36.0 g/dL   RDW 13.5 11.5 - 14.5 %   Platelets 157 150  - 440 K/uL  Basic metabolic panel     Status: Abnormal   Collection Time: 06/20/15 11:30 AM  Result Value Ref Range   Sodium 141 135 - 145 mmol/L   Potassium 3.4 (L) 3.5 - 5.1 mmol/L   Chloride 96 (L) 101 - 111 mmol/L   CO2 34 (H) 22 - 32 mmol/L   Glucose, Bld 129 (H) 65 - 99 mg/dL   BUN 12 6 - 20 mg/dL   Creatinine, Ser 0.72 0.61 - 1.24 mg/dL   Calcium 9.0 8.9 - 10.3 mg/dL   GFR calc non Af Amer >60 >60 mL/min   GFR calc Af Amer >60 >60 mL/min    Comment: (NOTE) The eGFR has been calculated using the CKD EPI equation. This calculation has not been validated in all clinical situations. eGFR's persistently <60 mL/min signify possible Chronic Kidney Disease.    Anion gap 11 5 - 15  Basic metabolic panel     Status: Abnormal   Collection Time: 06/21/15  4:47 AM  Result Value Ref Range   Sodium 139 135 - 145 mmol/L   Potassium 3.5 3.5 - 5.1 mmol/L   Chloride 99 (L) 101 - 111 mmol/L   CO2 31 22 - 32 mmol/L   Glucose, Bld 113 (H) 65 - 99 mg/dL   BUN 13 6 - 20 mg/dL   Creatinine, Ser 0.84 0.61 - 1.24 mg/dL   Calcium 8.8 (L) 8.9 - 10.3 mg/dL   GFR calc non Af Amer >60 >60 mL/min   GFR calc Af Amer >60 >60 mL/min    Comment: (NOTE) The eGFR has been calculated using the CKD EPI equation. This calculation has not been validated in all clinical situations. eGFR's persistently <60 mL/min signify possible Chronic Kidney Disease.    Anion gap 9 5 - 15  CBC     Status: Abnormal   Collection Time: 06/21/15  4:47 AM  Result Value Ref Range   WBC 11.3 (H) 3.8 - 10.6 K/uL   RBC 4.52 4.40 - 5.90 MIL/uL   Hemoglobin 14.4 13.0 - 18.0 g/dL   HCT 41.0 40.0 - 52.0 %   MCV 90.7 80.0 - 100.0 fL   MCH 32.0 26.0 - 34.0 pg   MCHC 35.2 32.0 - 36.0 g/dL   RDW 13.5 11.5 - 14.5 %   Platelets 140 (L) 150 - 440 K/uL  Culture, expectorated sputum-assessment     Status: None   Collection Time: 06/22/15  8:37 AM  Result Value Ref Range   Specimen Description EXPECTORATED SPUTUM    Special Requests  NONE    Sputum evaluation THIS SPECIMEN IS ACCEPTABLE FOR SPUTUM CULTURE    Report Status 06/23/2015 FINAL   Culture, respiratory (NON-Expectorated)     Status: None   Collection Time:  06/22/15  8:37 AM  Result Value Ref Range   Specimen Description EXPECTORATED SPUTUM    Special Requests NONE Reflexed from [id number]    Gram Stain      FEW WBC SEEN RARE GRAM NEGATIVE RODS MANY GRAM POSITIVE COCCI    Culture      HEAVY GROWTH HAEMOPHILUS PARAINFLUENZAE BETA LACTAMASE NEGATIVE LIGHT GROWTH ENTEROBACTER AEROGENES    Report Status 06/27/2015 FINAL    Organism ID, Bacteria ENTEROBACTER AEROGENES       Susceptibility   Enterobacter aerogenes - MIC*    CEFTAZIDIME Value in next row Sensitive      SENSITIVE<=1    CEFEPIME Value in next row Sensitive      SENSITIVE<=1    CEFTRIAXONE Value in next row Sensitive      SENSITIVE<=1    CIPROFLOXACIN Value in next row Sensitive      SENSITIVE<=0.25    GENTAMICIN Value in next row Sensitive      SENSITIVE<=1    IMIPENEM Value in next row Sensitive      SENSITIVE<=0.25    TRIMETH/SULFA Value in next row Sensitive      SENSITIVE<=20    * LIGHT GROWTH ENTEROBACTER AEROGENES  Fungus Culture with Smear     Status: None   Collection Time: 06/22/15 11:24 AM  Result Value Ref Range   Specimen Description BRONCHIAL WASHINGS    Special Requests Normal    Culture CANDIDA GLABRATA    Report Status 07/13/2015 FINAL   Culture, respiratory (NON-Expectorated)     Status: None   Collection Time: 06/22/15 11:24 AM  Result Value Ref Range   Specimen Description BRONCHIAL WASHINGS    Special Requests Normal    Gram Stain      GOOD SPECIMEN - 80-90% WBCS FEW WBC SEEN MANY GRAM POSITIVE COCCI RARE GRAM NEGATIVE RODS    Culture      HEAVY GROWTH HAEMOPHILUS PARAINFLUENZAE HEAVY GROWTH HAEMOPHILUS INFLUENZAE BETA LACTAMASE NEGATIVE    Report Status 06/28/2015 FINAL   Hemoglobin A1c     Status: None   Collection Time: 06/23/15  5:04 AM  Result  Value Ref Range   Hgb A1c MFr Bld 5.4 4.0 - 6.0 %  CBC     Status: Abnormal   Collection Time: 06/24/15  5:35 AM  Result Value Ref Range   WBC 6.7 3.8 - 10.6 K/uL   RBC 4.03 (L) 4.40 - 5.90 MIL/uL   Hemoglobin 12.6 (L) 13.0 - 18.0 g/dL   HCT 37.3 (L) 40.0 - 52.0 %   MCV 92.5 80.0 - 100.0 fL   MCH 31.2 26.0 - 34.0 pg   MCHC 33.8 32.0 - 36.0 g/dL   RDW 14.0 11.5 - 14.5 %   Platelets 160 150 - 440 K/uL     Assessment & Plan  1. Need for influenza vaccination  - Flu vaccine HIGH DOSE PF (Fluzone High dose)  2. Gastric out let obstruction   3. Esophagitis, reflux  - omeprazole (PRILOSEC) 20 MG capsule; Take 1 capsule (20 mg total) by mouth 2 (two) times daily before a meal.  Dispense: 60 capsule; Refill: 12  4. Essential (primary) hypertension  - hydrochlorothiazide (HYDRODIURIL) 25 MG tablet; Take 1 tablet (25 mg total) by mouth daily.  Dispense: 30 tablet; Refill: 12  5. Airway hyperreactivity, mild intermittent, uncomplicated  - albuterol (PROVENTIL HFA;VENTOLIN HFA) 108 (90 BASE) MCG/ACT inhaler; Inhale 1-2 puffs into the lungs every 6 (six) hours as needed for wheezing or shortness of breath.  Dispense:  1 Inhaler; Refill: 12  6. Decreased body weight   7. Malnutrition of moderate degree   8. Hyperlipidemia  - lovastatin (MEVACOR) 40 MG tablet; Take 0.5 tablets (20 mg total) by mouth at bedtime.  Dispense: 30 tablet; Refill: 12  9. BPH (benign prostatic hyperplasia)  - tamsulosin (FLOMAX) 0.4 MG CAPS capsule; Take 1 capsule (0.4 mg total) by mouth daily after breakfast.  Dispense: 30 capsule; Refill: 12

## 2015-07-24 NOTE — Patient Instructions (Addendum)
Make appt. With Dr. Allen Norris for further dilitation of gastric outlet obstruction ASAP.

## 2015-07-29 ENCOUNTER — Encounter: Payer: Self-pay | Admitting: Emergency Medicine

## 2015-07-29 ENCOUNTER — Emergency Department: Payer: Medicare Other

## 2015-07-29 ENCOUNTER — Emergency Department
Admission: EM | Admit: 2015-07-29 | Discharge: 2015-07-29 | Disposition: A | Discharge status: Home / Self Care | Payer: Medicare Other | Attending: Emergency Medicine | Admitting: Emergency Medicine

## 2015-07-29 DIAGNOSIS — M25552 Pain in left hip: Secondary | ICD-10-CM | POA: Diagnosis not present

## 2015-07-29 DIAGNOSIS — Z79899 Other long term (current) drug therapy: Secondary | ICD-10-CM | POA: Diagnosis not present

## 2015-07-29 DIAGNOSIS — R319 Hematuria, unspecified: Secondary | ICD-10-CM | POA: Diagnosis not present

## 2015-07-29 DIAGNOSIS — I10 Essential (primary) hypertension: Secondary | ICD-10-CM | POA: Diagnosis not present

## 2015-07-29 DIAGNOSIS — R52 Pain, unspecified: Secondary | ICD-10-CM | POA: Diagnosis not present

## 2015-07-29 DIAGNOSIS — M545 Low back pain: Secondary | ICD-10-CM | POA: Diagnosis not present

## 2015-07-29 DIAGNOSIS — M25551 Pain in right hip: Secondary | ICD-10-CM | POA: Insufficient documentation

## 2015-07-29 DIAGNOSIS — M533 Sacrococcygeal disorders, not elsewhere classified: Secondary | ICD-10-CM | POA: Diagnosis not present

## 2015-07-29 LAB — URINALYSIS COMPLETE WITH MICROSCOPIC (ARMC ONLY)
BILIRUBIN URINE: NEGATIVE
Bacteria, UA: NONE SEEN
GLUCOSE, UA: NEGATIVE mg/dL
Hgb urine dipstick: NEGATIVE
KETONES UR: NEGATIVE mg/dL
Leukocytes, UA: NEGATIVE
NITRITE: NEGATIVE
PROTEIN: 30 mg/dL — AB
SPECIFIC GRAVITY, URINE: 1.03 (ref 1.005–1.030)
Squamous Epithelial / LPF: NONE SEEN
pH: 5 (ref 5.0–8.0)

## 2015-07-29 LAB — CBC WITH DIFFERENTIAL/PLATELET
BASOS PCT: 1 %
Basophils Absolute: 0.1 10*3/uL (ref 0–0.1)
EOS ABS: 0 10*3/uL (ref 0–0.7)
EOS PCT: 0 %
HCT: 42.2 % (ref 40.0–52.0)
Hemoglobin: 14.4 g/dL (ref 13.0–18.0)
LYMPHS ABS: 0.6 10*3/uL — AB (ref 1.0–3.6)
Lymphocytes Relative: 6 %
MCH: 30.9 pg (ref 26.0–34.0)
MCHC: 34.2 g/dL (ref 32.0–36.0)
MCV: 90.3 fL (ref 80.0–100.0)
MONOS PCT: 8 %
Monocytes Absolute: 0.8 10*3/uL (ref 0.2–1.0)
NEUTROS PCT: 85 %
Neutro Abs: 8.5 10*3/uL — ABNORMAL HIGH (ref 1.4–6.5)
PLATELETS: 192 10*3/uL (ref 150–440)
RBC: 4.68 MIL/uL (ref 4.40–5.90)
RDW: 13.4 % (ref 11.5–14.5)
WBC: 10.1 10*3/uL (ref 3.8–10.6)

## 2015-07-29 LAB — COMPREHENSIVE METABOLIC PANEL
ALK PHOS: 83 U/L (ref 38–126)
ALT: 18 U/L (ref 17–63)
AST: 20 U/L (ref 15–41)
Albumin: 3.4 g/dL — ABNORMAL LOW (ref 3.5–5.0)
Anion gap: 6 (ref 5–15)
BUN: 19 mg/dL (ref 6–20)
CALCIUM: 9 mg/dL (ref 8.9–10.3)
CHLORIDE: 99 mmol/L — AB (ref 101–111)
CO2: 33 mmol/L — AB (ref 22–32)
CREATININE: 0.7 mg/dL (ref 0.61–1.24)
Glucose, Bld: 125 mg/dL — ABNORMAL HIGH (ref 65–99)
Potassium: 3.1 mmol/L — ABNORMAL LOW (ref 3.5–5.1)
Sodium: 138 mmol/L (ref 135–145)
Total Bilirubin: 1.4 mg/dL — ABNORMAL HIGH (ref 0.3–1.2)
Total Protein: 6.9 g/dL (ref 6.5–8.1)

## 2015-07-29 MED ORDER — OXYCODONE-ACETAMINOPHEN 5-325 MG PO TABS
1.0000 | ORAL_TABLET | Freq: Once | ORAL | Status: AC
  Administered 2015-07-29: 1 via ORAL
  Filled 2015-07-29: qty 1

## 2015-07-29 MED ORDER — OXYCODONE-ACETAMINOPHEN 5-325 MG PO TABS: 1.0000 | ORAL_TABLET | Freq: Four times a day (QID) | ORAL | Status: DC | PRN

## 2015-07-29 MED ORDER — FENTANYL CITRATE (PF) 100 MCG/2ML IJ SOLN
50.0000 ug | Freq: Once | INTRAMUSCULAR | Status: AC
  Administered 2015-07-29: 50 ug via INTRAVENOUS
  Filled 2015-07-29: qty 2

## 2015-07-29 NOTE — ED Notes (Signed)
Ice pack applied. Pt laced on 2l o2 r/t spo2 89

## 2015-07-29 NOTE — ED Notes (Signed)
Pt to ED via EMS from nursing home with multiple medical complaints including pain to lower back/tailbone and L knee, mild nausea, and generalized weakness. States symptoms started 3 days ago and daughter sent him to hospital when he was unable to get up from bed this morning d/t weakness. Pt afebrile, denies fall, denies injury, denies vomiting.

## 2015-07-29 NOTE — ED Notes (Signed)
Pt alert x4 assist to wc. Family to take pt home

## 2015-07-29 NOTE — Discharge Instructions (Signed)
As we discussed please follow up your primary care doctor tomorrow. Return to the emergency department for any worsening pain, fever, or any other symptom personally concerning to yourself.   Arthralgia Arthralgia is joint pain. A joint is a place where two bones meet. Joint pain can happen for many reasons. The joint can be bruised, stiff, infected, or weak from aging. Pain usually goes away after resting and taking medicine for soreness.  HOME CARE  Rest the joint as told by your doctor.  Keep the sore joint raised (elevated) for the first 24 hours.  Put ice on the joint area.  Put ice in a plastic bag.  Place a towel between your skin and the bag.  Leave the ice on for 15-20 minutes, 03-04 times a day.  Wear your splint, casting, elastic bandage, or sling as told by your doctor.  Only take medicine as told by your doctor. Do not take aspirin.  Use crutches as told by your doctor. Do not put weight on the joint until told to by your doctor. GET HELP RIGHT AWAY IF:   You have bruising, puffiness (swelling), or more pain.  Your fingers or toes turn blue or start to lose feeling (numb).  Your medicine does not lessen the pain.  Your pain becomes severe.  You have a temperature by mouth above 102 F (38.9 C), not controlled by medicine.  You cannot move or use the joint. MAKE SURE YOU:   Understand these instructions.  Will watch your condition.  Will get help right away if you are not doing well or get worse. Document Released: 10/01/2009 Document Revised: 01/05/2012 Document Reviewed: 10/01/2009 [facility] Patient Information 2015 [place], [place]. This information is not intended to replace advice given to you by your health care provider. Make sure you discuss any questions you have with your health care provider.

## 2015-07-29 NOTE — ED Provider Notes (Signed)
[facility] Emergency Department Provider Note  Time seen: 9:48 AM  I have reviewed the triage vital signs and the nursing notes.   HISTORY  Chief Complaint No chief complaint on file.    HPI Zachary Brewer is a 74 y.o. male with a past medical history of gastric reflux, hyperlipidemia, hypertension, arthritis who presents the emergency department with increased hip pain. According to the patient for many months he has had pain running between his hips and into his tailbone. He states it is worsened considerably over the last several days and he came to the emergency department for evaluation. Denies any falls or trauma. States he has been told it is bad arthritis in the past. States he took Tylenol at home without relief so he came to the emergency department. Patient currently lives in an apartment by himself but states he has neighbors) who can check on him. He scratches pain is moderate while in bed, severe features to get up and walk.     Past Medical History  Diagnosis Date  . Allergy   . GERD (gastroesophageal reflux disease)   . Hyperlipidemia   . Hypertension   . Arthritis 05/30/2015  . 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 Cervical pain 05/30/2015    Patient Active Problem List   Diagnosis Date Noted  . Hyperlipidemia 07/24/2015  . BPH (benign prostatic hyperplasia) 07/24/2015  . Malnutrition of moderate degree ([place]) 06/21/2015  . Gastric outlet obstruction   . Aspiration pneumonia ([place]) 06/20/2015  . Swallowing difficulty   . 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 Allergic rhinitis 05/30/2015  . Absolute anemia 05/30/2015  . Arthritis 05/30/2015  . 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  . Bradycardia 05/30/2015  . Black stool 05/30/2015  . Essential (primary) hypertension 05/30/2015  . Gastric out let obstruction 05/30/2015  . Esophagitis, reflux 05/30/2015  . Acid reflux 05/30/2015  . 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 Cervical pain 05/30/2015  . Blood glucose  elevated 05/30/2015  . Lump in scrotum 05/30/2015  . Pain in shoulder 05/30/2015  . Decreased body weight 05/30/2015    Past Surgical History  Procedure Laterality Date  . Gastric outlet obstruction release  2013  . 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 Upper gi endoscopy    . Esophagogastroduodenoscopy (egd) with propofol N/A 06/20/2015    Procedure: ESOPHAGOGASTRODUODENOSCOPY (EGD) WITH PROPOFOL with dialation;  Surgeon: Lucilla Lame, MD;  Location: [facility];  Service: Endoscopy;  Laterality: N/A;  . Flexible bronchoscopy N/A 06/22/2015    Procedure: FLEXIBLE BRONCHOSCOPY;  Surgeon: Allyne Gee, MD;  Location: [facility];  Service: Pulmonary;  Laterality: N/A;    Current Outpatient Rx  Name  Route  Sig  Dispense  Refill  . albuterol (PROVENTIL HFA;VENTOLIN HFA) 108 (90 BASE) MCG/ACT inhaler   Inhalation   Inhale 1-2 puffs into the lungs every 6 (six) hours as needed for wheezing or shortness of breath.   1 Inhaler   12   . DOCUSATE SODIUM PO   Oral   Take by mouth.         . Glucosamine-Chondroit-Vit C-Mn (GLUCOSAMINE CHONDR 1500 COMPLX) CAPS   Oral   Take by mouth.         . hydrochlorothiazide (HYDRODIURIL) 25 MG tablet   Oral   Take 1 tablet (25 mg total) by mouth daily.   30 tablet   12   . lovastatin (MEVACOR) 40 MG tablet   Oral   Take 0.5 tablets (20 mg total)  by mouth at bedtime.   30 tablet   12   . MULTIPLE VITAMIN PO   Oral   Take by mouth.         . NUTRITIONAL SUPPLEMENTS PO   Oral   Take by mouth.         Marland Kitchen omeprazole (PRILOSEC) 20 MG capsule   Oral   Take 1 capsule (20 mg total) by mouth 2 (two) times daily before a meal.   60 capsule   12   . tamsulosin (FLOMAX) 0.4 MG CAPS capsule   Oral   Take 1 capsule (0.4 mg total) by mouth daily after breakfast.   30 capsule   12     Allergies Review of patient's allergies indicates no known allergies.  Family History  Problem Relation Age of Onset  . Diabetes Mother   .  Heart disease Father   . Diabetes Sister   . Diabetes Brother   . Stroke Brother   . Mental illness Brother   . Hypertension Paternal Grandfather     Social History Social History  Substance Use Topics  . Smoking status: Never Smoker   . Smokeless tobacco: Never Used  . Alcohol Use: No    Review of Systems Constitutional: Negative for fever Cardiovascular: Negative for chest pain. Respiratory: Negative for shortness of breath. Gastrointestinal: Negative for abdominal pain Genitourinary: Negative for dysuria. Musculoskeletal: Negative for back pain. Positive for bilateral hip pain. Positive for tailbone pain. Neurological: Negative for headache 10-point ROS otherwise negative.  ____________________________________________   PHYSICAL EXAM:  VITAL SIGNS: ED Triage Vitals  Enc Vitals Group     BP --      Pulse --      Resp --      Temp --      Temp src --      SpO2 --      Weight --      Height --      Head Cir --      Peak Flow --      Pain Score --      Pain Loc --      Pain Edu? --      Excl. in [place]? --     Constitutional: Alert and oriented. Well appearing and in no distress. Eyes: Normal exam ENT   Head: Normocephalic and atraumatic.   Mouth/Throat: Mucous membranes are moist. Cardiovascular: Normal rate, regular rhythm. No murmur Respiratory: Normal respiratory effort without tachypnea nor retractions. Breath sounds are clear  Gastrointestinal: Soft and nontender. No distention.   Musculoskeletal: Pelvis is stable, able to range bilateral hips with mild discomfort. Moderate tenderness palpation on patient's sacral spine/coccyx. Neurologic:  Normal speech and language. No gross focal neurologic deficits  Psychiatric: Mood and affect are normal. Speech and behavior are normal. ____________________________________________   RADIOLOGY  X-ray within normal limits   EKG reviewed and interpreted by myself shows sinus rhythm at 79 bpm, narrow QRS,  left axis deviation, no ST changes noted. Occasional PVC.  X-ray and labs are largely within normal limits. We'll prescribe Percocet for pain control and have the patient follow-up with his primary care doctor tomorrow. Patient states he has a walker at home which she can use.   ____________________________________________    INITIAL IMPRESSION / ASSESSMENT AND PLAN / ED COURSE  Pertinent labs & imaging results that were available during my care of the patient were reviewed by me and considered in my medical decision making (see chart for details).  Patient presents for pelvis/hip pain. States it is worse but has been ongoing for many months. Denies any recent falls or trauma. Overall the patient appears well, moderate sacral/coccyx tenderness palpation. Mild hip tenderness bilaterally. We'll check a pelvis x-ray, check labs including urinalysis and treat pain.  ____________________________________________   FINAL CLINICAL IMPRESSION(S) / ED DIAGNOSES  Hip pain   Harvest Dark, MD 07/29/15 1531

## 2015-07-30 ENCOUNTER — Telehealth: Payer: Self-pay | Admitting: Family Medicine

## 2015-07-30 NOTE — Telephone Encounter (Signed)
Called [facility] they can see him on 08/06/2015 at 9:45 am but also advised Pam that she can take him to ER if he is in [place] within 2 days and her plan is to take him to ER she also mentioned that while he was in hospital his pulse ox was dropping but they haven't done anything. Nisha

## 2015-07-30 NOTE — Telephone Encounter (Signed)
FYI

## 2015-07-30 NOTE — Telephone Encounter (Signed)
Needs appt. With ortho ASAP.  Call both [facility] and [facility] to try toget him appt. ASAP.  If he is in agony and appt. Is not within the next day or two, then I would recommend that he be taken to ER at [facility] for their evaluation.-jh

## 2015-07-30 NOTE — Telephone Encounter (Signed)
Noted-jh 

## 2015-07-30 NOTE — Telephone Encounter (Signed)
Pt was at [facility] for lower back pain, left leg and knee pain.  He was released with no diagnosis and is at his daughter's home.  He is in a lot of pain and it's hard for her to move him.  Please call Pam [phone]

## 2015-07-30 NOTE — Telephone Encounter (Signed)
Called Pam-- pt had back pain, leg and knee pain and had used heat pad and pam advised to use cold pad and he was still in pain and she called 911 on Sunday and they didn't diagnosed with anything but gave him prednisone and pt is still in pain and can't even move now please suggest?  Nisha

## 2015-07-31 ENCOUNTER — Emergency Department: Payer: Medicare Other

## 2015-07-31 ENCOUNTER — Encounter: Payer: Self-pay | Admitting: Emergency Medicine

## 2015-07-31 ENCOUNTER — Observation Stay
Admission: EM | Admit: 2015-07-31 | Discharge: 2015-08-03 | Disposition: A | Discharge status: Skilled Nursing Facility | Payer: Medicare Other | Attending: Internal Medicine | Admitting: Internal Medicine

## 2015-07-31 DIAGNOSIS — I10 Essential (primary) hypertension: Secondary | ICD-10-CM | POA: Insufficient documentation

## 2015-07-31 DIAGNOSIS — M549 Dorsalgia, unspecified: Secondary | ICD-10-CM | POA: Insufficient documentation

## 2015-07-31 DIAGNOSIS — A047 Enterocolitis due to Clostridium difficile: Secondary | ICD-10-CM | POA: Insufficient documentation

## 2015-07-31 DIAGNOSIS — R197 Diarrhea, unspecified: Secondary | ICD-10-CM | POA: Diagnosis not present

## 2015-07-31 DIAGNOSIS — M1180 Other specified crystal arthropathies, unspecified site: Secondary | ICD-10-CM | POA: Diagnosis present

## 2015-07-31 DIAGNOSIS — M25462 Effusion, left knee: Secondary | ICD-10-CM | POA: Insufficient documentation

## 2015-07-31 DIAGNOSIS — L03116 Cellulitis of left lower limb: Secondary | ICD-10-CM | POA: Insufficient documentation

## 2015-07-31 DIAGNOSIS — E785 Hyperlipidemia, unspecified: Secondary | ICD-10-CM | POA: Diagnosis not present

## 2015-07-31 DIAGNOSIS — M11262 Other chondrocalcinosis, left knee: Secondary | ICD-10-CM | POA: Diagnosis not present

## 2015-07-31 DIAGNOSIS — M479 Spondylosis, unspecified: Secondary | ICD-10-CM | POA: Diagnosis not present

## 2015-07-31 DIAGNOSIS — G8929 Other chronic pain: Secondary | ICD-10-CM | POA: Insufficient documentation

## 2015-07-31 DIAGNOSIS — M179 Osteoarthritis of knee, unspecified: Secondary | ICD-10-CM | POA: Insufficient documentation

## 2015-07-31 DIAGNOSIS — M4806 Spinal stenosis, lumbar region: Secondary | ICD-10-CM | POA: Diagnosis not present

## 2015-07-31 DIAGNOSIS — K219 Gastro-esophageal reflux disease without esophagitis: Secondary | ICD-10-CM | POA: Diagnosis not present

## 2015-07-31 DIAGNOSIS — M1990 Unspecified osteoarthritis, unspecified site: Secondary | ICD-10-CM | POA: Diagnosis not present

## 2015-07-31 DIAGNOSIS — M7989 Other specified soft tissue disorders: Secondary | ICD-10-CM | POA: Diagnosis not present

## 2015-07-31 DIAGNOSIS — M1120 Other chondrocalcinosis, unspecified site: Secondary | ICD-10-CM | POA: Diagnosis present

## 2015-07-31 DIAGNOSIS — N400 Enlarged prostate without lower urinary tract symptoms: Secondary | ICD-10-CM | POA: Insufficient documentation

## 2015-07-31 DIAGNOSIS — E876 Hypokalemia: Secondary | ICD-10-CM | POA: Diagnosis not present

## 2015-07-31 DIAGNOSIS — R262 Difficulty in walking, not elsewhere classified: Secondary | ICD-10-CM | POA: Insufficient documentation

## 2015-07-31 DIAGNOSIS — M25562 Pain in left knee: Secondary | ICD-10-CM | POA: Diagnosis not present

## 2015-07-31 DIAGNOSIS — K210 Gastro-esophageal reflux disease with esophagitis: Secondary | ICD-10-CM | POA: Diagnosis not present

## 2015-07-31 LAB — CREATININE, SERUM
CREATININE: 0.68 mg/dL (ref 0.61–1.24)
GFR calc non Af Amer: >60 mL/min (ref >60)

## 2015-07-31 LAB — CBC
HCT: 39.7 % — ABNORMAL LOW (ref 40.0–52.0)
HEMOGLOBIN: 13.2 g/dL (ref 13.0–18.0)
MCH: 30.1 pg (ref 26.0–34.0)
MCHC: 33.2 g/dL (ref 32.0–36.0)
MCV: 90.6 fL (ref 80.0–100.0)
Platelets: 282 10*3/uL (ref 150–440)
RBC: 4.38 MIL/uL — AB (ref 4.40–5.90)
RDW: 14 % (ref 11.5–14.5)
WBC: 17.4 10*3/uL — AB (ref 3.8–10.6)

## 2015-07-31 LAB — URIC ACID: Uric Acid, Serum: 4.9 mg/dL (ref 4.4–7.6)

## 2015-07-31 LAB — SYNOVIAL CELL COUNT + DIFF, W/ CRYSTALS
Eosinophils-Synovial: 0 %
Lymphocytes-Synovial Fld: 5 %
MONOCYTE-MACROPHAGE-SYNOVIAL FLUID: 2 %
Neutrophil, Synovial: 93 %
Other Cells-SYN: 0
WBC, SYNOVIAL: 25317 /mm3 — AB (ref 0–200)

## 2015-07-31 MED ORDER — ALBUTEROL SULFATE HFA 108 (90 BASE) MCG/ACT IN AERS: 1.0000 | INHALATION_SPRAY | Freq: Four times a day (QID) | RESPIRATORY_TRACT | Status: DC | PRN

## 2015-07-31 MED ORDER — HYDROCHLOROTHIAZIDE 25 MG PO TABS
12.5000 mg | ORAL_TABLET | Freq: Every day | ORAL | Status: DC
  Administered 2015-08-01: 12.5 mg via ORAL
  Filled 2015-07-31: qty 1

## 2015-07-31 MED ORDER — ACETAMINOPHEN 650 MG RE SUPP: 650.0000 mg | Freq: Four times a day (QID) | RECTAL | Status: DC | PRN

## 2015-07-31 MED ORDER — LIDOCAINE HCL (PF) 1 % IJ SOLN
INTRAMUSCULAR | Status: AC
  Administered 2015-07-31: 5 mL
  Filled 2015-07-31: qty 5

## 2015-07-31 MED ORDER — PREDNISONE 20 MG PO TABS
20.0000 mg | ORAL_TABLET | Freq: Every day | ORAL | Status: DC
  Administered 2015-08-03: 20 mg via ORAL
  Filled 2015-07-31: qty 1

## 2015-07-31 MED ORDER — ONDANSETRON HCL 4 MG PO TABS: 4.0000 mg | ORAL_TABLET | Freq: Four times a day (QID) | ORAL | Status: DC | PRN

## 2015-07-31 MED ORDER — OXYCODONE-ACETAMINOPHEN 5-325 MG PO TABS
1.0000 | ORAL_TABLET | Freq: Once | ORAL | Status: AC
  Administered 2015-07-31: 1 via ORAL
  Filled 2015-07-31: qty 1

## 2015-07-31 MED ORDER — COLCHICINE 0.6 MG PO TABS
0.6000 mg | ORAL_TABLET | Freq: Two times a day (BID) | ORAL | Status: DC
  Administered 2015-07-31 – 2015-08-01 (×3): 0.6 mg via ORAL
  Filled 2015-07-31 (×3): qty 1

## 2015-07-31 MED ORDER — PREDNISONE 5 MG PO TABS: 10.0000 mg | ORAL_TABLET | Freq: Every day | ORAL | Status: DC

## 2015-07-31 MED ORDER — PANTOPRAZOLE SODIUM 40 MG PO TBEC
40.0000 mg | DELAYED_RELEASE_TABLET | Freq: Every day | ORAL | Status: DC
  Administered 2015-08-01 – 2015-08-03 (×3): 40 mg via ORAL
  Filled 2015-07-31 (×3): qty 1

## 2015-07-31 MED ORDER — PREDNISONE 5 MG PO TABS
30.0000 mg | ORAL_TABLET | Freq: Every day | ORAL | Status: DC
  Administered 2015-08-02 – 2015-08-03 (×2): 30 mg via ORAL
  Filled 2015-07-31 (×2): qty 2

## 2015-07-31 MED ORDER — PRAVASTATIN SODIUM 20 MG PO TABS
20.0000 mg | ORAL_TABLET | Freq: Every day | ORAL | Status: DC
  Administered 2015-08-01 – 2015-08-03 (×3): 20 mg via ORAL
  Filled 2015-07-31 (×3): qty 1

## 2015-07-31 MED ORDER — MORPHINE SULFATE (PF) 2 MG/ML IV SOLN
2.0000 mg | Freq: Once | INTRAVENOUS | Status: AC
  Administered 2015-07-31: 2 mg via INTRAVENOUS
  Filled 2015-07-31: qty 1

## 2015-07-31 MED ORDER — POTASSIUM CHLORIDE CRYS ER 20 MEQ PO TBCR
20.0000 meq | EXTENDED_RELEASE_TABLET | Freq: Two times a day (BID) | ORAL | Status: DC
  Administered 2015-07-31 – 2015-08-03 (×6): 20 meq via ORAL
  Filled 2015-07-31 (×6): qty 1

## 2015-07-31 MED ORDER — FENTANYL CITRATE (PF) 100 MCG/2ML IJ SOLN
25.0000 ug | Freq: Once | INTRAMUSCULAR | Status: AC
  Administered 2015-07-31: 25 ug via INTRAVENOUS
  Filled 2015-07-31: qty 2

## 2015-07-31 MED ORDER — TAMSULOSIN HCL 0.4 MG PO CAPS
0.4000 mg | ORAL_CAPSULE | Freq: Every day | ORAL | Status: DC
  Administered 2015-08-01 – 2015-08-03 (×3): 0.4 mg via ORAL
  Filled 2015-07-31 (×3): qty 1

## 2015-07-31 MED ORDER — ADULT MULTIVITAMIN W/MINERALS CH
1.0000 | ORAL_TABLET | Freq: Every day | ORAL | Status: DC
  Administered 2015-08-02 – 2015-08-03 (×2): 1 via ORAL
  Filled 2015-07-31 (×4): qty 1

## 2015-07-31 MED ORDER — PREDNISONE 20 MG PO TABS
60.0000 mg | ORAL_TABLET | Freq: Once | ORAL | Status: AC
  Administered 2015-07-31: 60 mg via ORAL
  Filled 2015-07-31: qty 3

## 2015-07-31 MED ORDER — PREDNISONE 20 MG PO TABS
40.0000 mg | ORAL_TABLET | Freq: Every day | ORAL | Status: DC
  Administered 2015-08-01 – 2015-08-03 (×2): 40 mg via ORAL
  Filled 2015-07-31 (×3): qty 2

## 2015-07-31 MED ORDER — ONDANSETRON HCL 4 MG/2ML IJ SOLN: 4.0000 mg | Freq: Four times a day (QID) | INTRAMUSCULAR | Status: DC | PRN

## 2015-07-31 MED ORDER — ENOXAPARIN SODIUM 40 MG/0.4ML ~~LOC~~ SOLN
40.0000 mg | SUBCUTANEOUS | Status: DC
  Administered 2015-07-31 – 2015-08-02 (×3): 40 mg via SUBCUTANEOUS
  Filled 2015-07-31 (×3): qty 0.4

## 2015-07-31 MED ORDER — CEPHALEXIN 500 MG PO CAPS
500.0000 mg | ORAL_CAPSULE | Freq: Four times a day (QID) | ORAL | Status: DC
  Administered 2015-07-31: 500 mg via ORAL
  Filled 2015-07-31: qty 1

## 2015-07-31 MED ORDER — ACETAMINOPHEN 325 MG PO TABS: 650.0000 mg | ORAL_TABLET | Freq: Four times a day (QID) | ORAL | Status: DC | PRN

## 2015-07-31 MED ORDER — OXYCODONE-ACETAMINOPHEN 5-325 MG PO TABS
1.0000 | ORAL_TABLET | Freq: Four times a day (QID) | ORAL | Status: DC | PRN
  Administered 2015-07-31 – 2015-08-03 (×7): 1 via ORAL
  Filled 2015-07-31 (×8): qty 1

## 2015-07-31 MED ORDER — ALBUTEROL SULFATE (2.5 MG/3ML) 0.083% IN NEBU: 2.5000 mg | INHALATION_SOLUTION | Freq: Four times a day (QID) | RESPIRATORY_TRACT | Status: DC | PRN

## 2015-07-31 NOTE — ED Notes (Signed)
Presents via ems from home . Having increased back pain for the past couple of days  . States he is unable to walk d/t pain

## 2015-07-31 NOTE — H&P (Signed)
[facility] [place] NAME: Zachary Brewer    MR#:  [id number]  DATE OF BIRTH:  February 03, 1941  DATE OF ADMISSION:  07/31/2015  PRIMARY CARE PHYSICIAN: [name], MD   REQUESTING/REFERRING PHYSICIAN: Dr. Conni Slipper  CHIEF COMPLAINT:   Chief Complaint  Patient presents with  . Back Pain   left knee pain  HISTORY OF PRESENT ILLNESS:  Zachary Brewer  is a 74 y.o. male with a known history of hypertension, hyperlipidemia, osteoarthritis, GERD who presented to the hospital due to left knee pain and difficulty ambulating. Patient says that he has been having some left knee swelling now ongoing for the past 2-3 days and he has been able to bear weight on it but today it got much worse and was having significant pain and therefore came to the ER for further evaluation. He denies any fevers, chills, nausea, vomiting, rashes, abdominal pain or any other associated symptoms. In the emergency room patient underwent arthrocentesis which was diagnostic for pseudogout. He received some prednisone and some pain medications but despite that he was not able to ambulate or bear any weight on that left knee. Hospitalist services were contacted further treatment and evaluation.  PAST MEDICAL HISTORY:   Past Medical History  Diagnosis Date  . Allergy   . GERD (gastroesophageal reflux disease)   . Hyperlipidemia   . Hypertension   . Arthritis 05/30/2015  . 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 Cervical pain 05/30/2015    PAST SURGICAL HISTORY:   Past Surgical History  Procedure Laterality Date  . Gastric outlet obstruction release  2013  . 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 Upper gi endoscopy    . Esophagogastroduodenoscopy (egd) with propofol N/A 06/20/2015    Procedure: ESOPHAGOGASTRODUODENOSCOPY (EGD) WITH PROPOFOL with dialation;  Surgeon: Lucilla Lame, MD;  Location: [facility];  Service: Endoscopy;  Laterality: N/A;  . Flexible bronchoscopy N/A  06/22/2015    Procedure: FLEXIBLE BRONCHOSCOPY;  Surgeon: Allyne Gee, MD;  Location: [facility];  Service: Pulmonary;  Laterality: N/A;    SOCIAL HISTORY:   Social History  Substance Use Topics  . Smoking status: Never Smoker   . Smokeless tobacco: Never Used  . Alcohol Use: No    FAMILY HISTORY:   Family History  Problem Relation Age of Onset  . Diabetes Mother   . Heart disease Father   . Diabetes Sister   . Diabetes Brother   . Stroke Brother   . Mental illness Brother   . Hypertension Paternal Grandfather     DRUG ALLERGIES:  No Known Allergies  REVIEW OF SYSTEMS:   Review of Systems  Constitutional: Negative for fever and weight loss.  HENT: Negative for congestion, nosebleeds and tinnitus.   Eyes: Negative for blurred vision, double vision and redness.  Respiratory: Negative for cough, hemoptysis and shortness of breath.   Cardiovascular: Negative for chest pain, orthopnea, leg swelling and PND.  Gastrointestinal: Negative for nausea, vomiting, abdominal pain, diarrhea and melena.  Genitourinary: Negative for dysuria, urgency and hematuria.  Musculoskeletal: Positive for joint pain (left knee). Negative for falls.  Neurological: Negative for dizziness, tingling, sensory change, focal weakness, seizures, weakness and headaches.  Endo/Heme/Allergies: Negative for polydipsia. Does not bruise/bleed easily.  Psychiatric/Behavioral: Negative for depression and memory loss. The patient is not nervous/anxious.     MEDICATIONS AT HOME:   Prior to Admission medications   Medication Sig Start Date End Date Taking? Authorizing Provider  albuterol (  PROVENTIL HFA;VENTOLIN HFA) 108 (90 BASE) MCG/ACT inhaler Inhale 1-2 puffs into the lungs every 6 (six) hours as needed for wheezing or shortness of breath. 07/24/15  Yes Arlis Porta., MD  hydrochlorothiazide (HYDRODIURIL) 25 MG tablet Take 1 tablet (25 mg total) by mouth daily. Patient taking differently: Take 12.5 mg by  mouth daily.  07/24/15  Yes Arlis Porta., MD  lovastatin (MEVACOR) 40 MG tablet Take 0.5 tablets (20 mg total) by mouth at bedtime. 07/24/15  Yes Arlis Porta., MD  Multiple Vitamin (MULTIVITAMIN) tablet Take 1 tablet by mouth daily.   Yes Historical Provider, MD  omeprazole (PRILOSEC) 20 MG capsule Take 1 capsule (20 mg total) by mouth 2 (two) times daily before a meal. 07/24/15  Yes Arlis Porta., MD  tamsulosin [facility]) 0.4 MG CAPS capsule Take 1 capsule (0.4 mg total) by mouth daily after breakfast. 07/24/15  Yes Arlis Porta., MD  oxyCODONE-acetaminophen (ROXICET) 5-325 MG tablet Take 1 tablet by mouth every 6 (six) hours as needed. Patient not taking: Reported on 07/31/2015 07/29/15   Harvest Dark, MD      VITAL SIGNS:  Blood pressure 113/69, pulse 78, temperature 98 F (36.7 C), temperature source Oral, resp. rate 18, height 5\' 7"  (1.702 m), weight 61.236 kg (135 lb), SpO2 95 %.  PHYSICAL EXAMINATION:  Physical Exam  GENERAL:  74 y.o.-year-old patient lying in the bed with no acute distress.  EYES: Pupils equal, round, reactive to light and accommodation. No scleral icterus. Extraocular muscles intact.  HEENT: Head atraumatic, normocephalic. Oropharynx and nasopharynx clear. No oropharyngeal erythema, moist oral mucosa  NECK:  Supple, no jugular venous distention. No thyroid enlargement, no tenderness.  LUNGS: Normal breath sounds bilaterally, no wheezing, rales, rhonchi. No use of accessory muscles of respiration.  CARDIOVASCULAR: S1, S2 RRR. No murmurs, rubs, gallops, clicks.  ABDOMEN: Soft, nontender, nondistended. Bowel sounds present. No organomegaly or mass.  EXTREMITIES: No pedal edema, cyanosis, or clubbing. + 2 pedal & radial pulses b/l.  Left knee swelling with limited range of motion. Warm to touch with some slight redness. NEUROLOGIC: Cranial nerves II through XII are intact. No focal Motor or sensory deficits appreciated b/l.   PSYCHIATRIC: The  patient is alert and oriented x 3. Good affect.  SKIN: No obvious rash, lesion, or ulcer.   LABORATORY PANEL:   CBC  Recent Labs Lab 07/29/15 1008  WBC 10.1  HGB 14.4  HCT 42.2  PLT 192   ------------------------------------------------------------------------------------------------------------------  Chemistries   Recent Labs Lab 07/29/15 1008  NA 138  K 3.1*  CL 99*  CO2 33*  GLUCOSE 125*  BUN 19  CREATININE 0.70  CALCIUM 9.0  AST 20  ALT 18  ALKPHOS 83  BILITOT 1.4*   ------------------------------------------------------------------------------------------------------------------  Cardiac Enzymes No results for input(s): TROPONINI in the last 168 hours. ------------------------------------------------------------------------------------------------------------------  RADIOLOGY:  Dg Lumbar Spine Complete  07/31/2015   CLINICAL DATA:  Back pain and difficulty walking.  EXAM: LUMBAR SPINE - COMPLETE 4+ VIEW  COMPARISON:  None.  FINDINGS: The lumbar spine demonstrates diffuse spondylosis. The L1-2 level shows mild disc space narrowing and moderate proliferative changes. The L2-3 level demonstrates moderately severe disc space narrowing and proliferative changes with a mild grade 1 anterolisthesis of L2 on L3 of approximately 7 mm. The L3-4 level demonstrates moderate disc space narrowing and proliferative changes. The L4-5 level demonstrates moderate disc space narrowing, proliferative changes and mild anterolisthesis of L5 relative to L4 of approximately  3 mm. The L5-S1 level demonstrates significant proliferative changes, disc space narrowing and moderate anterolisthesis of L5 relative to S1 of approximately 15-17 mm. There may be underlying bilateral pars defects at the L5 level.  No evidence of compression fractures or bony lesions.  IMPRESSION: 1. Significant multilevel spondylosis throughout the lumbar spine, as above. 2. Significant anterolisthesis of L5 relative to  S1 of approximately 15- 17 mm. 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 3. No acute fracture identified.   Electronically Signed   By: Aletta Edouard M.D.   On: 07/31/2015 10:05   Dg Knee Complete 4 Views Left  07/31/2015   CLINICAL DATA:  Left knee pain and swelling, unable to extend the knee, difficult to wall cor bear weight.  EXAM: LEFT KNEE - COMPLETE 4+ VIEW  COMPARISON:  None in PACs  FINDINGS: The bones of the left knee are osteopenic. There appears to be mild lateral subluxation of the proximal tibia with respect to the femoral condyles. The joint spaces are reasonably well-maintained. There is beaking of the medial tibial spine. There are osteophytes arising from the articular margins of the patella. There is a moderate-sized joint effusion in the suprapatellar and popliteal regions. The proximal fibula is intact.  IMPRESSION: There is a large joint effusion in the suprapatellar as well as popliteal regions. There are mild to moderate osteoarthritic changes centered chiefly on the patellofemoral compartment but also in the medial and lateral compartments.   Electronically Signed   By: David  Martinique M.D.   On: 07/31/2015 09:51     IMPRESSION AND PLAN:   74 year old male with past medical history of hypertension, hyperlipidemia, osteoarthritis, BPH who presents to the hospital due to left knee swelling and pain and difficulty ambulating.  #1 left knee pain/swelling-this is likely secondary to inflammatory arthritis and suspected pseudogout. -We'll start the patient on colchicine, prednisone taper.  -If not improving would consider rheumatology consult. Will also check a uric acid level.  #2 difficulty ambulating-due to the left knee pain and pseudogout. Continue care as mentioned above. -We will get physical therapy consult to assess his mobility in a.m.   #3 hypertension-continue HCTZ.  #4 hyperlipidemia-continue Pravachol.  #5 GERD-continue  Protonix.  #6 hypokalemia-we'll start patient on oral potassium supplements.    All the records are reviewed and case discussed with ED provider. Management plans discussed with the patient, family and they are in agreement.  CODE STATUS: Full  TOTAL TIME TAKING CARE OF THIS PATIENT: 45 minutes.    Henreitta Leber M.D on 07/31/2015 at 7:17 PM  Between 7am to 6pm - Pager - [phone]  After 6pm go to [email or web address] - password [email or web address] [facility]  Office  [phone]  CC: Primary care physician; [name], MD

## 2015-07-31 NOTE — ED Notes (Signed)
Also states his right knee is swollen and gives out.

## 2015-07-31 NOTE — ED Provider Notes (Addendum)
Labs returned crystal analysis shows calcium pyrophosphate crystals I discussed this with the lab at his CPAP crystals. Since patient is older and is having back pain and has had black stools I will use a prednisone taper to treat this  Nena Polio, MD 07/31/15 1649 Social worker is attempting to get patient into rehabilitation until he can be better. He is unable to be cared for at home. Unfortunately none of the places have called back yet and all of the admissions coordinator seen and gone home for the day we will attempt to observe him overnight and try for placement again tomorrow morning  Nena Polio, MD 07/31/15 1720

## 2015-07-31 NOTE — ED Provider Notes (Signed)
[facility] Emergency Department Provider Note  ____________________________________________  Time seen: Approximately 8:26 AM  I have reviewed the triage vital signs and the nursing notes.   HISTORY  Chief Complaint Back Pain   HPI TRAVEION RUDDOCK is a 74 y.o. male who presents to the emergency department for evaluation of continuing and worsening lower back, hip, and left knee pain. Pain has been ongoing for several months, but is worsening by the day even with pain medication. He states that he is unable to straighten his left knee due to the pain. He states that last Saturday he was able to walk with his cane, but is no longer able to walk at all. He denies recent injury. He has been constipated for the past 4 days as well.   Past Medical History  Diagnosis Date  . Allergy   . GERD (gastroesophageal reflux disease)   . Hyperlipidemia   . Hypertension   . Arthritis 05/30/2015  . 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 Cervical pain 05/30/2015    Patient Active Problem List   Diagnosis Date Noted  . Hyperlipidemia 07/24/2015  . BPH (benign prostatic hyperplasia) 07/24/2015  . Malnutrition of moderate degree ([place]) 06/21/2015  . Gastric outlet obstruction   . Aspiration pneumonia ([place]) 06/20/2015  . Swallowing difficulty   . 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 Allergic rhinitis 05/30/2015  . Absolute anemia 05/30/2015  . Arthritis 05/30/2015  . 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  . Bradycardia 05/30/2015  . Black stool 05/30/2015  . Essential (primary) hypertension 05/30/2015  . Gastric out let obstruction 05/30/2015  . Esophagitis, reflux 05/30/2015  . Acid reflux 05/30/2015  . 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 Cervical pain 05/30/2015  . Blood glucose elevated 05/30/2015  . Lump in scrotum 05/30/2015  . Pain in shoulder 05/30/2015  . Decreased body weight 05/30/2015    Past Surgical History  Procedure Laterality Date  . Gastric outlet obstruction release  2013   . 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 Upper gi endoscopy    . Esophagogastroduodenoscopy (egd) with propofol N/A 06/20/2015    Procedure: ESOPHAGOGASTRODUODENOSCOPY (EGD) WITH PROPOFOL with dialation;  Surgeon: Lucilla Lame, MD;  Location: [facility];  Service: Endoscopy;  Laterality: N/A;  . Flexible bronchoscopy N/A 06/22/2015    Procedure: FLEXIBLE BRONCHOSCOPY;  Surgeon: Allyne Gee, MD;  Location: [facility];  Service: Pulmonary;  Laterality: N/A;    Current Outpatient Rx  Name  Route  Sig  Dispense  Refill  . albuterol (PROVENTIL HFA;VENTOLIN HFA) 108 (90 BASE) MCG/ACT inhaler   Inhalation   Inhale 1-2 puffs into the lungs every 6 (six) hours as needed for wheezing or shortness of breath.   1 Inhaler   12   . Glucosamine-Chondroit-Vit C-Mn (GLUCOSAMINE CHONDR 1500 COMPLX) CAPS   Oral   Take 2 capsules by mouth daily.          . hydrochlorothiazide (HYDRODIURIL) 25 MG tablet   Oral   Take 1 tablet (25 mg total) by mouth daily. Patient taking differently: Take 12.5 mg by mouth daily.    30 tablet   12   . lovastatin (MEVACOR) 40 MG tablet   Oral   Take 0.5 tablets (20 mg total) by mouth at bedtime.   30 tablet   12   . MULTIPLE VITAMIN PO   Oral   Take 1 tablet by mouth daily.          . Nutritional Supplements (CARNATION INSTANT BREAKFAST PO)  Oral   Take 1 Can by mouth daily with breakfast.         . omeprazole (PRILOSEC) 20 MG capsule   Oral   Take 1 capsule (20 mg total) by mouth 2 (two) times daily before a meal.   60 capsule   12   . oxyCODONE-acetaminophen (ROXICET) 5-325 MG tablet   Oral   Take 1 tablet by mouth every 6 (six) hours as needed.   20 tablet   0   . tamsulosin (FLOMAX) 0.4 MG CAPS capsule   Oral   Take 1 capsule (0.4 mg total) by mouth daily after breakfast.   30 capsule   12     Allergies Review of patient's allergies indicates no known allergies.  Family History  Problem Relation Age of Onset  . Diabetes Mother    . Heart disease Father   . Diabetes Sister   . Diabetes Brother   . Stroke Brother   . Mental illness Brother   . Hypertension Paternal Grandfather     Social History Social History  Substance Use Topics  . Smoking status: Never Smoker   . Smokeless tobacco: Never Used  . Alcohol Use: No    Review of Systems Constitutional: No fever/chills Eyes: No visual changes. ENT: No sore throat. Cardiovascular: Denies chest pain. Respiratory: Denies shortness of breath. Gastrointestinal: No abdominal pain.  No nausea, no vomiting.  No diarrhea.  No constipation. Genitourinary: Negative for dysuria. Musculoskeletal: Positive for back pain, left hip pain, and left knee pain Skin: Negative for rash. Neurological: Negative for headaches, focal weakness or numbness.  10-point ROS otherwise negative.  ____________________________________________   PHYSICAL EXAM:  VITAL SIGNS: ED Triage Vitals  Enc Vitals Group     BP 07/31/15 0824 132/98 mmHg     Pulse Rate 07/31/15 0824 103     Resp 07/31/15 0824 18     Temp 07/31/15 0824 98.5 F (36.9 C)     Temp Source 07/31/15 0824 Oral     SpO2 07/31/15 0824 96 %     Weight 07/31/15 0824 135 lb (61.236 kg)     Height 07/31/15 0824 5\' 7"  (1.702 m)     Head Cir --      Peak Flow --      Pain Score 07/31/15 0825 10     Pain Loc --      Pain Edu? --      Excl. in [place]? --     Constitutional: Alert and oriented. Well appearing and in no acute distress. Eyes: Conjunctivae are normal. PERRL. EOMI. Head: Atraumatic. Nose: No congestion/rhinnorhea. Mouth/Throat: Mucous membranes are moist.  Oropharynx non-erythematous. Neck: No stridor.   Cardiovascular: Normal rate, regular rhythm. Grossly normal heart sounds.  Good peripheral circulation. Respiratory: Normal respiratory effort.  No retractions. Lungs CTAB. Gastrointestinal: Soft and nontender. No distention. No abdominal bruits. No CVA tenderness. Musculoskeletal: Tenderness to palpation  in the sacral area. Left knee swollen with obvious joint effusion. Limited ROM of the left knee due to swelling and pain. Neurologic:  Normal speech and language. No gross focal neurologic deficits are appreciated. Gait not tested. Skin:  Skin is warm, dry and intact. No rash noted. Psychiatric: Mood and affect are normal. Speech and behavior are normal.  ____________________________________________   LABS (all labs ordered are listed, but only abnormal results are displayed)  Labs Reviewed - No data to display ____________________________________________  EKG   ____________________________________________  RADIOLOGY   ____________________________________________   PROCEDURES  Procedure(s) performed:  Critical Care performed: No  ____________________________________________   INITIAL IMPRESSION / ASSESSMENT AND PLAN / ED COURSE  Pertinent labs & imaging results that were available during my care of the patient were reviewed by me and considered in my medical decision making (see chart for details).  8:25 AM Care relinquished to Dr. Clearnce Hasten for further workup and exam. ____________________________________________   FINAL CLINICAL IMPRESSION(S) / ED DIAGNOSES  Final diagnoses:  None      Victorino Dike, FNP 07/31/15 1235  Lavonia Drafts, MD 07/31/15 1524

## 2015-07-31 NOTE — Progress Notes (Signed)
LCSW called [facility] to see if their bed availability Left message for [employer]

## 2015-07-31 NOTE — ED Notes (Signed)
Attempted to call report, charge nurse to call back when available

## 2015-07-31 NOTE — Progress Notes (Signed)
LCSW met with Patient and daughter and will complete FL2 and call the [facility] requested for placement LCSW to check on bed availabilty. 

## 2015-07-31 NOTE — ED Notes (Signed)
Dr. Schaevitz at bedside.  

## 2015-07-31 NOTE — Progress Notes (Signed)
LCSW has called [facility], [facility], [employer]

## 2015-07-31 NOTE — ED Provider Notes (Signed)
[facility] Emergency Department Provider Note  ____________________________________________  Time seen: Approximately 163 AM  I have reviewed the triage vital signs and the nursing notes.   HISTORY  Chief Complaint Back Pain    HPI Zachary Brewer is a 74 y.o. male with a history of arthritis who is here complaining of worsening left knee pain and swelling. He says the knee has been worsening and swelling and knee pain over the past several days and has been "giving out." He denies any fever. Also with back pain which he says is intermittent and to the low back. Denies any back pain at this time. Denies any loss of bowel or bladder continence.Back pain is cramping and midline. Denies any injuries or falls.   Past Medical History  Diagnosis Date  . Allergy   . GERD (gastroesophageal reflux disease)   . Hyperlipidemia   . Hypertension   . Arthritis 05/30/2015  . 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 Cervical pain 05/30/2015    Patient Active Problem List   Diagnosis Date Noted  . Hyperlipidemia 07/24/2015  . BPH (benign prostatic hyperplasia) 07/24/2015  . Malnutrition of moderate degree ([place]) 06/21/2015  . Gastric outlet obstruction   . Aspiration pneumonia ([place]) 06/20/2015  . Swallowing difficulty   . 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 Allergic rhinitis 05/30/2015  . Absolute anemia 05/30/2015  . Arthritis 05/30/2015  . 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  . Bradycardia 05/30/2015  . Black stool 05/30/2015  . Essential (primary) hypertension 05/30/2015  . Gastric out let obstruction 05/30/2015  . Esophagitis, reflux 05/30/2015  . Acid reflux 05/30/2015  . 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 Cervical pain 05/30/2015  . Blood glucose elevated 05/30/2015  . Lump in scrotum 05/30/2015  . Pain in shoulder 05/30/2015  . Decreased body weight 05/30/2015    Past Surgical History  Procedure Laterality Date  . Gastric outlet obstruction release  2013  . 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 Upper gi endoscopy    . Esophagogastroduodenoscopy (egd) with propofol N/A 06/20/2015    Procedure: ESOPHAGOGASTRODUODENOSCOPY (EGD) WITH PROPOFOL with dialation;  Surgeon: Lucilla Lame, MD;  Location: [facility];  Service: Endoscopy;  Laterality: N/A;  . Flexible bronchoscopy N/A 06/22/2015    Procedure: FLEXIBLE BRONCHOSCOPY;  Surgeon: Allyne Gee, MD;  Location: [facility];  Service: Pulmonary;  Laterality: N/A;    Current Outpatient Rx  Name  Route  Sig  Dispense  Refill  . albuterol (PROVENTIL HFA;VENTOLIN HFA) 108 (90 BASE) MCG/ACT inhaler   Inhalation   Inhale 1-2 puffs into the lungs every 6 (six) hours as needed for wheezing or shortness of breath.   1 Inhaler   12   . hydrochlorothiazide (HYDRODIURIL) 25 MG tablet   Oral   Take 1 tablet (25 mg total) by mouth daily. Patient taking differently: Take 12.5 mg by mouth daily.    30 tablet   12   . lovastatin (MEVACOR) 40 MG tablet   Oral   Take 0.5 tablets (20 mg total) by mouth at bedtime.   30 tablet   12   . Multiple Vitamin (MULTIVITAMIN) tablet   Oral   Take 1 tablet by mouth daily.         Marland Kitchen omeprazole (PRILOSEC) 20 MG capsule   Oral   Take 1 capsule (20 mg total) by mouth 2 (two) times daily before a meal.   60 capsule   12   . tamsulosin (FLOMAX) 0.4 MG CAPS capsule  Oral   Take 1 capsule (0.4 mg total) by mouth daily after breakfast.   30 capsule   12   . oxyCODONE-acetaminophen (ROXICET) 5-325 MG tablet   Oral   Take 1 tablet by mouth every 6 (six) hours as needed. Patient not taking: Reported on 07/31/2015   20 tablet   0     Allergies Review of patient's allergies indicates no known allergies.  Family History  Problem Relation Age of Onset  . Diabetes Mother   . Heart disease Father   . Diabetes Sister   . Diabetes Brother   . Stroke Brother   . Mental illness Brother   . Hypertension Paternal Grandfather     Social History Social History   Substance Use Topics  . Smoking status: Never Smoker   . Smokeless tobacco: Never Used  . Alcohol Use: No    Review of Systems Constitutional: No fever/chills Eyes: No visual changes. ENT: No sore throat. Cardiovascular: Denies chest pain. Respiratory: Denies shortness of breath. Gastrointestinal: No abdominal pain.  No nausea, no vomiting.  No diarrhea.  No constipation. Genitourinary: Negative for dysuria. Musculoskeletal: As above  Skin: Negative for rash. Neurological: Negative for headaches, focal weakness or numbness.  10-point ROS otherwise negative.  ____________________________________________   PHYSICAL EXAM:  VITAL SIGNS: ED Triage Vitals  Enc Vitals Group     BP 07/31/15 0824 132/98 mmHg     Pulse Rate 07/31/15 0824 103     Resp 07/31/15 0824 18     Temp 07/31/15 0824 98.5 F (36.9 C)     Temp Source 07/31/15 0824 Oral     SpO2 07/31/15 0824 96 %     Weight 07/31/15 0824 135 lb (61.236 kg)     Height 07/31/15 0824 5\' 7"  (1.702 m)     Head Cir --      Peak Flow --      Pain Score 07/31/15 0825 10     Pain Loc --      Pain Edu? --      Excl. in [place]? --     Constitutional: Alert and oriented. Well appearing and in no acute distress. Eyes: Conjunctivae are normal. PERRL. EOMI. Head: Atraumatic. Nose: No congestion/rhinnorhea. Mouth/Throat: Mucous membranes are moist.  Oropharynx non-erythematous. Neck: No stridor.   Cardiovascular: Normal rate, regular rhythm. Grossly normal heart sounds.  Good peripheral circulation. Respiratory: Normal respiratory effort.  No retractions. Lungs CTAB. Gastrointestinal: Soft and nontender. No distention. No abdominal bruits. No CVA tenderness. Musculoskeletal: No tenderness to the thoracic or lumbar spines either centrally or laterally. There is no deformity, ecchymosis or step-off. The left lower extremity is held in about 75 of flexion at the knee. There is a palpable effusion to the left knee. There is a small  overlying area of erythema. The knee joint is painful to passive range of motion. Neurovascularly intact distally. Also with warmth to the left knee. Neurologic:  Normal speech and language. No gross focal neurologic deficits are appreciated. No gait instability. Skin: Erythema to the left knee over the inferior medial aspect. This area covers about 3 x 4 cm. No induration or pus. Family says that it was just noticed this morning. Psychiatric: Mood and affect are normal. Speech and behavior are normal.  ____________________________________________   LABS (all labs ordered are listed, but only abnormal results are displayed)  Labs Reviewed  SYNOVIAL CELL COUNT + DIFF, W/ CRYSTALS - Abnormal; Notable for the following:    Color, Synovial RED (*)  Appearance-Synovial CLOUDY (*)    WBC, Synovial 25317 (*)    All other components within normal limits  BODY FLUID CULTURE   ____________________________________________  EKG   ____________________________________________  RADIOLOGY   large joint effusion to the knee on plain film. Mild to moderate osteoarthritic changes. Lumbar films with significant multilevel spondylosis. Significant anterior listhesis L5 and S1. No acute fracture on the films. ____________________________________________   PROCEDURES  ARTHOCENTESIS Performed by: Doran Stabler Consent: Verbal consent obtained. Risks and benefits: risks, benefits and alternatives were discussed Consent given by: patient Required items: required blood products, implants, devices, and special equipment available Patient identity confirmed: verbally with patient Time out: Immediately prior to procedure a "time out" was called to verify the correct patient, procedure, equipment, support staff and site/side marked as required. Indications: Large effusion to the left knee  Joint: Left knee Local anesthesia used: 4 mL of 1% lidocaine  Preparation: Patient was prepped and draped  in the usual sterile fashion. Aspirate appearance: Serosanguineous with debris  Aspirate amount: 95 ml Patient tolerance: Patient tolerated the procedure well with no immediate complications.   Approach was medial and superior at the area of maximum fluctuance. The patient did have a small amount of erythema to the medial and inferior portion of the knee which I did not puncture through.  ____________________________________________   INITIAL IMPRESSION / [place] / ED COURSE  Pertinent labs & imaging results that were available during my care of the patient were reviewed by me and considered in my medical decision making (see chart for details).  ----------------------------------------- 3:37 PM on 07/31/2015 -----------------------------------------  Patient resting comfortably at this time. Still with pain with movement of the knee but with improvement after having the arthrocentesis. He does have an area of erythema over the left knee. I'll prescribe Keflex for a mild cellulitis. Updated family about cell count which appears to show an inflammatory arthritis. They also note that the differential is still pending. Social worker is working with the family in order to secure placement. Patient family are aware and are awaiting placement at this time. ____________________________________________   FINAL CLINICAL IMPRESSION(S) / ED DIAGNOSES  Acute left-sided arthritic knee effusion. Acute cellulitis.     Orbie Pyo, MD 07/31/15 1539

## 2015-08-01 ENCOUNTER — Encounter
Admission: RE | Admit: 2015-08-01 | Discharge: 2015-08-01 | Disposition: A | Discharge status: Home / Self Care | Payer: Medicare Other | Source: Ambulatory Visit | Attending: Internal Medicine | Admitting: Internal Medicine

## 2015-08-01 DIAGNOSIS — A047 Enterocolitis due to Clostridium difficile: Secondary | ICD-10-CM | POA: Diagnosis not present

## 2015-08-01 DIAGNOSIS — M11262 Other chondrocalcinosis, left knee: Secondary | ICD-10-CM | POA: Diagnosis not present

## 2015-08-01 DIAGNOSIS — R262 Difficulty in walking, not elsewhere classified: Secondary | ICD-10-CM | POA: Diagnosis not present

## 2015-08-01 DIAGNOSIS — I10 Essential (primary) hypertension: Secondary | ICD-10-CM | POA: Diagnosis not present

## 2015-08-01 DIAGNOSIS — E785 Hyperlipidemia, unspecified: Secondary | ICD-10-CM | POA: Diagnosis not present

## 2015-08-01 LAB — CBC
HCT: 38.8 % — ABNORMAL LOW (ref 40.0–52.0)
HEMOGLOBIN: 13.2 g/dL (ref 13.0–18.0)
MCH: 30.8 pg (ref 26.0–34.0)
MCHC: 34 g/dL (ref 32.0–36.0)
MCV: 90.4 fL (ref 80.0–100.0)
PLATELETS: 277 10*3/uL (ref 150–440)
RBC: 4.3 MIL/uL — AB (ref 4.40–5.90)
RDW: 13.7 % (ref 11.5–14.5)
WBC: 16.4 10*3/uL — ABNORMAL HIGH (ref 3.8–10.6)

## 2015-08-01 LAB — BASIC METABOLIC PANEL
ANION GAP: 6 (ref 5–15)
BUN: 22 mg/dL — ABNORMAL HIGH (ref 6–20)
CALCIUM: 8.9 mg/dL (ref 8.9–10.3)
CO2: 34 mmol/L — ABNORMAL HIGH (ref 22–32)
Chloride: 100 mmol/L — ABNORMAL LOW (ref 101–111)
Creatinine, Ser: 0.56 mg/dL — ABNORMAL LOW (ref 0.61–1.24)
GLUCOSE: 128 mg/dL — AB (ref 65–99)
Potassium: 4.4 mmol/L (ref 3.5–5.1)
Sodium: 140 mmol/L (ref 135–145)

## 2015-08-01 NOTE — Progress Notes (Signed)
Pt. Alert and oriented. VSS. Pills whole with water. Pain controlled with PO pain meds. Left knee swollen and very painful. 95CC fluid aspirated off of knee last night in ED. Using urinal. Resting quietly.

## 2015-08-01 NOTE — Progress Notes (Signed)
Patient ID: Zachary Brewer, male   DOB: 03-Jul-1941, 74 y.o.   MRN: [id number] [facility] PROGRESS NOTE  PCP: [name], MD  HPI/Subjective: Patient still with severe pain in his left knee. A little bit less than yesterday. He is able to bend his knee a little bit today.  Objective: Filed Vitals:   08/01/15 0847  BP: 120/76  Pulse: 76  Temp: 97.7 F (36.5 C)  Resp: 18    Filed Weights   07/31/15 0824 07/31/15 2007  Weight: 61.236 kg (135 lb) 63.186 kg (139 lb 4.8 oz)    ROS: Review of Systems  Constitutional: Negative for fever and chills.  Eyes: Negative for blurred vision.  Respiratory: Negative for cough and shortness of breath.   Cardiovascular: Negative for chest pain.  Gastrointestinal: Negative for nausea, vomiting, abdominal pain, diarrhea and constipation.  Genitourinary: Negative for dysuria.  Musculoskeletal: Positive for joint pain.  Neurological: Negative for dizziness and headaches.   Exam: Physical Exam  Constitutional: He is oriented to person, place, and time.  HENT:  Nose: No mucosal edema.  Mouth/Throat: No oropharyngeal exudate or posterior oropharyngeal edema.  Eyes: Conjunctivae, EOM and lids are normal. Pupils are equal, round, and reactive to light.  Neck: No JVD present. Carotid bruit is not present. No edema present. No thyroid mass and no thyromegaly present.  Cardiovascular: S1 normal and S2 normal.  Exam reveals no gallop.   No murmur heard. Pulses:      Dorsalis pedis pulses are 2+ on the right side, and 2+ on the left side.  Respiratory: No respiratory distress. He has no wheezes. He has no rhonchi. He has no rales.  GI: Soft. Bowel sounds are normal. There is no tenderness.  Musculoskeletal:       Left knee: He exhibits decreased range of motion and swelling.       Right ankle: He exhibits no swelling.       Left ankle: He exhibits no swelling.  Poor range of motion left knee.  Lymphadenopathy:    He has no  cervical adenopathy.  Neurological: He is alert and oriented to person, place, and time. No cranial nerve deficit.  Skin: Skin is warm. No rash noted. Nails show no clubbing.  Psychiatric: He has a normal mood and affect.    Data Reviewed: Basic Metabolic Panel:  Recent Labs Lab 07/29/15 1008 07/31/15 2055 08/01/15 0645  NA 138  --  140  K 3.1*  --  4.4  CL 99*  --  100*  CO2 33*  --  34*  GLUCOSE 125*  --  128*  BUN 19  --  22*  CREATININE 0.70 0.68 0.56*  CALCIUM 9.0  --  8.9   Liver Function Tests:  Recent Labs Lab 07/29/15 1008  AST 20  ALT 18  ALKPHOS 83  BILITOT 1.4*  PROT 6.9  ALBUMIN 3.4*   CBC:  Recent Labs Lab 07/29/15 1008 07/31/15 2055 08/01/15 0645  WBC 10.1 17.4* 16.4*  NEUTROABS 8.5*  --   --   HGB 14.4 13.2 13.2  HCT 42.2 39.7* 38.8*  MCV 90.3 90.6 90.4  PLT 192 282 277     Recent Results (from the past 240 hour(s))  Body fluid culture     Status: None (Preliminary result)   Collection Time: 07/31/15 11:39 AM  Result Value Ref Range Status   Specimen Description KNEE  Final   Special Requests NONE  Final   Gram Stain MANY WBC  SEEN NO ORGANISMS SEEN   Final   Culture NO GROWTH < 24 HOURS  Final   Report Status PENDING  Incomplete     Studies: Dg Lumbar Spine Complete  07/31/2015   CLINICAL DATA:  Back pain and difficulty walking.  EXAM: LUMBAR SPINE - COMPLETE 4+ VIEW  COMPARISON:  None.  FINDINGS: The lumbar spine demonstrates diffuse spondylosis. The L1-2 level shows mild disc space narrowing and moderate proliferative changes. The L2-3 level demonstrates moderately severe disc space narrowing and proliferative changes with a mild grade 1 anterolisthesis of L2 on L3 of approximately 7 mm. The L3-4 level demonstrates moderate disc space narrowing and proliferative changes. The L4-5 level demonstrates moderate disc space narrowing, proliferative changes and mild anterolisthesis of L5 relative to L4 of approximately 3 mm. The L5-S1 level  demonstrates significant proliferative changes, disc space narrowing and moderate anterolisthesis of L5 relative to S1 of approximately 15-17 mm. There may be underlying bilateral pars defects at the L5 level.  No evidence of compression fractures or bony lesions.  IMPRESSION: 1. Significant multilevel spondylosis throughout the lumbar spine, as above. 2. Significant anterolisthesis of L5 relative to S1 of approximately 15- 17 mm. 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 3. No acute fracture identified.   Electronically Signed   By: Aletta Edouard M.D.   On: 07/31/2015 10:05   Dg Knee Complete 4 Views Left  07/31/2015   CLINICAL DATA:  Left knee pain and swelling, unable to extend the knee, difficult to wall cor bear weight.  EXAM: LEFT KNEE - COMPLETE 4+ VIEW  COMPARISON:  None in PACs  FINDINGS: The bones of the left knee are osteopenic. There appears to be mild lateral subluxation of the proximal tibia with respect to the femoral condyles. The joint spaces are reasonably well-maintained. There is beaking of the medial tibial spine. There are osteophytes arising from the articular margins of the patella. There is a moderate-sized joint effusion in the suprapatellar and popliteal regions. The proximal fibula is intact.  IMPRESSION: There is a large joint effusion in the suprapatellar as well as popliteal regions. There are mild to moderate osteoarthritic changes centered chiefly on the patellofemoral compartment but also in the medial and lateral compartments.   Electronically Signed   By: David  Martinique M.D.   On: 07/31/2015 09:51    Scheduled Meds: . colchicine  0.6 mg Oral BID  . enoxaparin (LOVENOX) injection  40 mg Subcutaneous Q24H  . hydrochlorothiazide  12.5 mg Oral Daily  . multivitamin with minerals  1 tablet Oral Daily  . pantoprazole  40 mg Oral Daily  . potassium chloride  20 mEq Oral BID  . pravastatin  20 mg Oral q1800  . predniSONE  40 mg Oral Q breakfast    And  . [START ON 08/02/2015] predniSONE  30 mg Oral Q breakfast   And  . [START ON 08/03/2015] predniSONE  20 mg Oral Q breakfast   And  . [START ON 08/04/2015] predniSONE  10 mg Oral Q breakfast  . tamsulosin  0.4 mg Oral QPC breakfast    Assessment/Plan:  1. Pseudogout left knee, poor ambulation and mobility.- Continue prednisone taper. Colchicine. Likely out to rehabilitation tomorrow. 2. Essential hypertension- DC hydrochlorothiazide as this may induce gout 3. Hyperlipidemia unspecified continue pravastatin 4. Gastroesophageal reflux disease without esophagitis continue Protonix  Code Status:     Code Status Orders        Start     Ordered  07/31/15 2017  Full code   Continuous     07/31/15 2016     Disposition Plan: Likely at to rehabilitation tomorrow  Consultants:  Physical therapy  Time spent: 25 minutes  Loletha Grayer  [facility]

## 2015-08-01 NOTE — Clinical Social Work Placement (Signed)
   CLINICAL SOCIAL WORK PLACEMENT  NOTE  Date:  08/01/2015  Patient Details  Name: Zachary Brewer MRN: [id number] Date of Birth: [birth year]  Clinical Social Work is seeking post-discharge placement for this patient at the [place] level of care (*CSW will initial, date and re-position this form in  chart as items are completed):  Yes   Patient/family provided with [place] Work Department's list of facilities offering this level of care within the geographic area requested by the patient (or if unable, by the patient's family).  Yes   Patient/family informed of their freedom to choose among providers that offer the needed level of care, that participate in Medicare, Medicaid or managed care program needed by the patient, have an available bed and are willing to accept the patient.  Yes   Patient/family informed of [facility]'s ownership interest in [facility] and [facility], as well as of the fact that they are under no obligation to receive care at these facilities.  PASRR submitted to EDS on 08/01/15     PASRR number received on 08/01/15     Existing PASRR number confirmed on       FL2 transmitted to all facilities in geographic area requested by pt/family on 08/01/15     FL2 transmitted to all facilities within larger geographic area on       Patient informed that his/her managed care company has contracts with or will negotiate with certain facilities, including the following:        Yes   Patient/family informed of bed offers received.  Patient chooses bed at  [facility] )     Physician recommends and patient chooses bed at      Patient to be transferred to   on  .  Patient to be transferred to facility by       Patient family notified on   of transfer.  Name of family member notified:        PHYSICIAN Please sign FL2     Additional Comment:    _______________________________________________ Loralyn Freshwater,  LCSW 08/01/2015, 12:15 PM

## 2015-08-01 NOTE — Clinical Social Work Note (Signed)
Clinical Social Work Assessment  Patient Details  Name: Zachary Brewer MRN: [id number] Date of Birth: November 07, 1940  Date of referral:  08/01/15               Reason for consult:  Facility Placement                Permission sought to share information with:  Chartered certified accountant granted to share information::  Yes, Verbal Permission Granted  Name::      [place]::   [place]   Relationship::     Contact Information:     Housing/Transportation Living arrangements for the past 2 months:  [place] of Information:  Patient, Adult Children Patient Interpreter Needed:  None Criminal Activity/Legal Involvement Pertinent to Current Situation/Hospitalization:  No - Comment as needed Significant Relationships:  Adult Children Lives with:  Self Do you feel safe going back to the place where you live?  Yes Need for family participation in patient care:  Yes (Comment)  Care giving concerns: Patient lives alone at [facility] apartment complex in [place].    Social Worker assessment / plan:  Holiday representative (CSW) received SNF consult. PT is recommending SNF. CSW met with patient alone at bedside. Patient was alert and oriented and sitting in the chair. CSW introduced self and explained role of CSW department. Patient reported that he lives alone in [place]. Per patient he has 2 daughters Zachary Brewer and Zachary Brewer. Zachary Brewer lives in the area and Zachary Brewer is coming in from [place], [place]. Per patient he does not wear oxygen at home. Patient is agreeable to SNF search in [facility].   CSW presented bed offers to patient. He asked CSW to contact his daughter Zachary Brewer. CSW called Zachary Brewer and presented offers. Zachary Brewer chose [facility]. Patient is in agreement with [facility]. Zachary Brewer admissions coordinator at [facility] is aware of accepted offer. Patient reported that he would like his daughter to transport him to [place]. CSW made patient aware that  if he continues to require oxygen he will likely need EMS for transport.   Employment status:  Disabled (Comment on whether or not currently receiving Disability), Retired Nurse, adult PT Recommendations:  [place] / Referral to community resources:  [place]  Patient/Family's Response to care:  Patient and daughter Zachary Brewer are in agreement with [facility].   Patient/Family's Understanding of and Emotional Response to Diagnosis, Current Treatment, and Prognosis:  Patient was pleasant throughout assessment.   Emotional Assessment Appearance:  Appears stated age Attitude/Demeanor/Rapport:    Affect (typically observed):  Accepting, Adaptable, Pleasant Orientation:  Oriented to Self, Oriented to Place, Oriented to  Time, Oriented to Situation Alcohol / Substance use:  Not Applicable Psych involvement (Current and /or in the community):  No (Comment)  Discharge Needs  Concerns to be addressed:  Discharge Planning Concerns Readmission within the last 30 days:  No Current discharge risk:  Dependent with Mobility Barriers to Discharge:  Continued Medical Work up   Zachary Freshwater, LCSW 08/01/2015, 12:16 PM

## 2015-08-01 NOTE — Evaluation (Signed)
Physical Therapy Evaluation Patient Details Name: Zachary Brewer MRN: [id number] DOB: May 28, 1941 Today's Date: 08/01/2015   History of Present Illness  Zachary Brewer is a 74 y.o. male with a known history of hypertension, hyperlipidemia, osteoarthritis, GERD who presented to the hospital due to left knee pain and difficulty ambulating. Patient says that he has been having some left knee swelling now ongoing for the past 2-3 days and he has been able to bear weight on it but today it got much worse and was having significant pain and therefore came to the ER for further evaluation. In the emergency room patient underwent arthrocentesis which was diagnostic for pseudogout  Clinical Impression  Pt presents with L knee swelling and pain limiting his ability to ambulate and perform functional mobility safely. Pt is mod inded with bed mobility, requiring increased time to transfer from supine<>sit. Pt is min -mod assist with RW with all other functional mobility (sit<>stand, stand pivot transfer, ambulation). Pt is at an increased risk for falls due to lack of awareness of deficits; made several comments of "i'm very independent" during evaluation, even though he had multiple instances of imbalance and L knee buckling when standing. Pt also has ROM deficits in L knee (Ext: -15, Flexion: 55 degrees) and increased girth measurement in L knee (13.5 inches) compared to 12 inches on R. Pt requires further skilled acute PT services in order to increase L knee ROM, strength and functional mobility. Current d/c recommendation is SNF due to decreased safety with returning home alone.     Follow Up Recommendations SNF    Equipment Recommendations  Rolling walker with 5" wheels    Recommendations for Other Services       Precautions / Restrictions Precautions Precautions: Fall Restrictions Weight Bearing Restrictions: No      Mobility  Bed Mobility Overal bed mobility: Modified Independent              General bed mobility comments: takes increased time to transfer supine <>sitting, did not use bed rails for assistance  Transfers Overall transfer level: Needs assistance Equipment used: Rolling walker (2 wheeled) Transfers: Sit to/from [facility] Sit to Stand: Min assist Stand pivot transfers: Mod assist       General transfer comment: Pt is extremely unsteady on his feet with multiple instances of the L knee buckling. Pt tends to rush/panic when up on his feet, needs to be reminded to take his time in order to promote safety with functional movement. Pt displays impulsiveness with getting up which places him at increased risk for falls. Pt does not realize how unsteady he his when standing and constantly states 'I'm very independent'  Ambulation/Gait Ambulation/Gait assistance: Min assist Ambulation Distance (Feet): 40 Feet Assistive device: Rolling walker (2 wheeled) Gait Pattern/deviations: Step-to pattern;Antalgic;Decreased weight shift to left;Decreased step length - right;Decreased stance time - left   Gait velocity interpretation: Below normal speed for age/gender General Gait Details: Pt tends to place a majority of his weight through his BUEs when using RW for ambulation. Pt lacks both terminal knee extension and adequate flexion of the R knee, which causes him to display multiple instances of L knee instability and buckling.   Stairs            Wheelchair Mobility    Modified Rankin (Stroke Patients Only)       Balance Overall balance assessment: Needs assistance Sitting-balance support: Feet supported Sitting balance-Leahy Scale: Good     Standing balance support: Bilateral upper  extremity supported Standing balance-Leahy Scale: Poor Standing balance comment: pt is not able to achieve terminal knee extension instance. multiple instances of L knee buckling noted.                              Pertinent Vitals/Pain Pain  Assessment: 0-10 Pain Score: 4  Pain Location: L knee Pain Intervention(s): Repositioned;Monitored during session    Home Living Family/patient expects to be discharged to:: Skilled nursing facility Living Arrangements: Alone               Additional Comments: pt lives alone; stated today that he does not feel comfortable or safe returning home and performing all of his ADLs (cooking for himself, cleaning etc) independently    Prior Function Level of Independence: Independent               Hand Dominance        Extremity/Trunk Assessment   Upper Extremity Assessment: Overall WFL for tasks assessed           Lower Extremity Assessment: Overall WFL for tasks assessed;LLE deficits/detail   LLE Deficits / Details: decreased ROM and swelling: Girth measurement of LLE: 13.5 inches(RLE: 12  inches), ROM of L knee: Extension: -15 degrees, Flexion: 55 degrees (both taken in supine)     Communication   Communication: No difficulties  Cognition Arousal/Alertness: Awake/alert Behavior During Therapy: WFL for tasks assessed/performed Overall Cognitive Status: Within Functional Limits for tasks assessed                      General Comments      Exercises Total Joint Exercises Goniometric ROM: L knee flexion: 55, Extension: -15  Other Exercises Other Exercises: sit<>stand transfer x 5 w/ RW and min assist Other Exercises: stand pivot transfer to [facility] with mod assit and RW; pt held static squat position x 1 min for self cleaning post-tolieting       Assessment/Plan    PT Assessment Patient needs continued PT services  PT Diagnosis Difficulty walking;Abnormality of gait;Acute pain   PT Problem List Decreased range of motion;Decreased activity tolerance;Decreased balance;Decreased mobility;Decreased safety awareness;Pain  PT Treatment Interventions DME instruction;Gait training;Functional mobility training;Therapeutic activities;Therapeutic exercise;Balance  training;Manual techniques   PT Goals (Current goals can be found in the Care Plan section) Acute Rehab PT Goals Patient Stated Goal: to go somewhere to get help with everyday activities PT Goal Formulation: With patient Time For Goal Achievement: 08/15/15 Potential to Achieve Goals: Fair    Frequency Min 2X/week   Barriers to discharge Decreased caregiver support      Co-evaluation               End of Session Equipment Utilized During Treatment: Gait belt Activity Tolerance: Patient tolerated treatment well Patient left: in chair;with chair alarm set;with call bell/phone within reach           Time: 0925-1000 PT Time Calculation (min) (ACUTE ONLY): 35 min   Charges:         PT G CodesMilon Score  08/01/2015, 11:23 AM

## 2015-08-01 NOTE — Care Management Note (Signed)
Case Management Note  Patient Details  Name: Zachary Brewer MRN: [id number] Date of Birth: 07/04/1941  Subjective/Objective:                  Me with patient to discuss discharge planning. He is from home alone. His daughter [name] works for [employer] by [employer] and lives ~ 2 miles from patient's home. He has been using a cane to ambulate with. O2 is new. He has a borrowed walker at home that his deceased wife used and his daughter has borrowed one from [place] by [place] per patient. PT is recommending SNF and patient agrees.  Action/Plan: CSW updated. RNCM will continue to follow. List of home health agencies left with patient.   Expected Discharge Date:  08/02/15               Expected Discharge Plan:     In-House Referral:  Clinical Social Work  Discharge planning Services  CM Consult  Post Acute Care Choice:    Choice offered to:  Patient  DME Arranged:    DME Agency:     HH Arranged:    [place] Agency:     Status of Service:  In process, will continue to follow  Medicare Important Message Given:    Date Medicare IM Given:    Medicare IM give by:    Date Additional Medicare IM Given:    Additional Medicare Important Message give by:     If discussed at [place] of Stay Meetings, dates discussed:    Additional Comments:  Marshell Garfinkel, RN 08/01/2015, 11:06 AM

## 2015-08-02 DIAGNOSIS — R262 Difficulty in walking, not elsewhere classified: Secondary | ICD-10-CM | POA: Diagnosis not present

## 2015-08-02 DIAGNOSIS — A047 Enterocolitis due to Clostridium difficile: Secondary | ICD-10-CM | POA: Diagnosis not present

## 2015-08-02 DIAGNOSIS — M11262 Other chondrocalcinosis, left knee: Secondary | ICD-10-CM | POA: Diagnosis not present

## 2015-08-02 DIAGNOSIS — E785 Hyperlipidemia, unspecified: Secondary | ICD-10-CM | POA: Diagnosis not present

## 2015-08-02 LAB — C DIFFICILE QUICK SCREEN W PCR REFLEX
C Diff antigen: POSITIVE — AB
C Diff interpretation: POSITIVE
C Diff toxin: POSITIVE — AB

## 2015-08-02 MED ORDER — METRONIDAZOLE 500 MG PO TABS
500.0000 mg | ORAL_TABLET | Freq: Three times a day (TID) | ORAL | Status: DC
  Administered 2015-08-02 – 2015-08-03 (×4): 500 mg via ORAL
  Filled 2015-08-02 (×4): qty 1

## 2015-08-02 MED ORDER — PREDNISONE 5 MG PO TABS: ORAL_TABLET | ORAL | Status: DC

## 2015-08-02 MED ORDER — OXYCODONE-ACETAMINOPHEN 5-325 MG PO TABS: 1.0000 | ORAL_TABLET | Freq: Four times a day (QID) | ORAL | Status: DC | PRN

## 2015-08-02 NOTE — Progress Notes (Addendum)
Patient is positive for C diff and per [facility] admissions coordinator at [facility] they can't accept him because they have no private rooms available. Clinical Education officer, museum (CSW) made patient's daughter [name] aware of above. Pam chose [facility] Per [facility] admissions coordinator at [place] they can accept him. Patient is aware of above. Per MD patient will likely be ready for D/C tomorrow. CSW will continue to follow and assist as needed.   Blima Rich, [place] [phone]

## 2015-08-02 NOTE — Discharge Summary (Addendum)
[facility] [place] NAME: Zachary Brewer    MR#:  [id number]  DATE OF BIRTH:  May 12, 1941  DATE OF ADMISSION:  07/31/2015 ADMITTING PHYSICIAN: Henreitta Leber, MD  DATE OF DISCHARGE: 08/02/2015  PRIMARY CARE PHYSICIAN: [name], MD    ADMISSION DIAGNOSIS:  Pseudogout [M11.80] Chronic back pain [M54.9, G89.29] Knee effusion, left [M25.462] Cellulitis of left lower extremity [S56.812]  DISCHARGE DIAGNOSIS:  Active Problems:   Pseudogout of knee   SECONDARY DIAGNOSIS:   Past Medical History  Diagnosis Date  . Allergy   . GERD (gastroesophageal reflux disease)   . Hyperlipidemia   . Hypertension   . Arthritis 05/30/2015  . 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 Cervical pain 05/30/2015    HOSPITAL COURSE:   1. Pseudogout of left knee. Difficulty walking due to left knee joint pain. The ER physician did an aspiration of the left knee and the patient was found to have pseudogout. The patient was started on colchicine and prednisone. The patient still has some pain in the left knee and range of motion is still difficult. Patient's knee has a little more range of motion than admission. Since the patient is having diarrhea I will stop colchicine. I will continue her prednisone taper. The patient will go out to rehabilitation. If no improvement after steroids taper can consider orthopedic consultation. 2. Clostridium difficile colitis - his risk factor was recent antibiotics for pneumonia. The patient stated that he had diarrhea as outpatient and had diarrhea yesterday. Initially I thought it was due to the colchicine and I stopped that but the stool study came back positive for C. difficile. I will treat with Flagyl 500 mg 3 times a day for a total of 14 days. Advised that he must stay hydrated and eat. Patient had a loose stool this morning and 1 last night. 3. Hyperlipidemia unspecified- continue lovastatin 4. BPH without urinary obstruction  continue Flomax 5. Gastroesophageal reflux disease without esophagitis on PPI 6. Essential hypertension- I'm holding hydrochlorothiazide.   DISCHARGE CONDITIONS:   Satisfactory  CONSULTS OBTAINED:  None  DRUG ALLERGIES:  No Known Allergies  DISCHARGE MEDICATIONS:   Current Discharge Medication List    START taking these medications   Details  metroNIDAZOLE (FLAGYL) 500 MG tablet Take 1 tablet (500 mg total) by mouth 3 (three) times daily. Qty: 39 tablet, Refills: 0    predniSONE (DELTASONE) 5 MG tablet 5 tabs day1; 4 tabs day2; 3 tabs day3; 2 tabs day4; 1 tab day5; 1/2 tab day6,7 Qty: 16 tablet, Refills: 0      CONTINUE these medications which have CHANGED   Details  oxyCODONE-acetaminophen (ROXICET) 5-325 MG tablet Take 1 tablet by mouth every 6 (six) hours as needed. Qty: 30 tablet, Refills: 0      CONTINUE these medications which have NOT CHANGED   Details  albuterol (PROVENTIL HFA;VENTOLIN HFA) 108 (90 BASE) MCG/ACT inhaler Inhale 1-2 puffs into the lungs every 6 (six) hours as needed for wheezing or shortness of breath. Qty: 1 Inhaler, Refills: 12   Associated Diagnoses: Airway hyperreactivity, mild intermittent, uncomplicated    lovastatin (MEVACOR) 40 MG tablet Take 0.5 tablets (20 mg total) by mouth at bedtime. Qty: 30 tablet, Refills: 12   Associated Diagnoses: Hyperlipidemia    Multiple Vitamin (MULTIVITAMIN) tablet Take 1 tablet by mouth daily.    omeprazole (PRILOSEC) 20 MG capsule Take 1 capsule (20 mg total) by mouth 2 (two) times daily before a meal.  Qty: 60 capsule, Refills: 12   Associated Diagnoses: Esophagitis, reflux    tamsulosin (FLOMAX) 0.4 MG CAPS capsule Take 1 capsule (0.4 mg total) by mouth daily after breakfast. Qty: 30 capsule, Refills: 12   Associated Diagnoses: BPH (benign prostatic hyperplasia)      STOP taking these medications     hydrochlorothiazide (HYDRODIURIL) 25 MG tablet          DISCHARGE INSTRUCTIONS:    Follow-up with Dr. rehabilitation 1 week  If you experience worsening of your admission symptoms, develop shortness of breath, life threatening emergency, suicidal or homicidal thoughts you must seek medical attention immediately by calling 911 or calling your MD immediately  if symptoms less severe.  You Must read complete instructions/literature along with all the possible adverse reactions/side effects for all the Medicines you take and that have been prescribed to you. Take any new Medicines after you have completely understood and accept all the possible adverse reactions/side effects.   Please note  You were cared for by a hospitalist during your hospital stay. If you have any questions about your discharge medications or the care you received while you were in the hospital after you are discharged, you can call the unit and asked to speak with the hospitalist on call if the hospitalist that took care of you is not available. Once you are discharged, your primary care physician will handle any further medical issues. Please note that NO REFILLS for any discharge medications will be authorized once you are discharged, as it is imperative that you return to your primary care physician (or establish a relationship with a primary care physician if you do not have one) for your aftercare needs so that they can reassess your need for medications and monitor your lab values.    Today   CHIEF COMPLAINT:   Chief Complaint  Patient presents with  . Back Pain    HISTORY OF PRESENT ILLNESS:  Zachary Brewer  is a 74 y.o. male presented with left knee pain and difficulty moving it. He was diagnosed with pseudogout and admitted to the hospital   VITAL SIGNS:  Blood pressure 132/62, pulse 71, temperature 97.2 F (36.2 C), temperature source Oral, resp. rate 18, height 5\' 7"  (1.702 m), weight 63.186 kg (139 lb 4.8 oz), SpO2 92 %.    PHYSICAL EXAMINATION:  GENERAL:  74 y.o.-year-old patient  lying in the bed with no acute distress.  EYES: Pupils equal, round, reactive to light and accommodation. No scleral icterus. Extraocular muscles intact.  HEENT: Head atraumatic, normocephalic. Oropharynx and nasopharynx clear.  NECK:  Supple, no jugular venous distention. No thyroid enlargement, no tenderness.  LUNGS: Normal breath sounds bilaterally, no wheezing, rales,rhonchi or crepitation. No use of accessory muscles of respiration.  CARDIOVASCULAR: S1, S2 normal. No murmurs, rubs, or gallops.  ABDOMEN: Soft, non-tender, non-distended. Bowel sounds present. No organomegaly or mass.  EXTREMITIES: No pedal edema, cyanosis, or clubbing. Left knee swelling. Patient has better range of motion of the left knee. Pain when flexing the knee at 80. NEUROLOGIC: Cranial nerves II through XII are intact. Patient able to straight leg raise bilaterally. Sensation intact. Gait not checked.  PSYCHIATRIC: The patient is alert and oriented x 3.  SKIN: No obvious rash, lesion, or ulcer.   DATA REVIEW:   CBC  Recent Labs Lab 08/01/15 0645  WBC 16.4*  HGB 13.2  HCT 38.8*  PLT 277    Chemistries   Recent Labs Lab 07/29/15 1008  08/01/15 0645  NA 138  --  140  K 3.1*  --  4.4  CL 99*  --  100*  CO2 33*  --  34*  GLUCOSE 125*  --  128*  BUN 19  --  22*  CREATININE 0.70  < > 0.56*  CALCIUM 9.0  --  8.9  AST 20  --   --   ALT 18  --   --   ALKPHOS 83  --   --   BILITOT 1.4*  --   --   < > = values in this interval not displayed.   Microbiology Results  Results for orders placed or performed during the hospital encounter of 07/31/15  Body fluid culture     Status: None (Preliminary result)   Collection Time: 07/31/15 11:39 AM  Result Value Ref Range Status   Specimen Description KNEE  Final   Special Requests NONE  Final   Gram Stain MANY WBC SEEN NO ORGANISMS SEEN   Final   Culture NO GROWTH 2 DAYS  Final   Report Status PENDING  Incomplete  C difficile quick scan w PCR reflex      Status: Abnormal   Collection Time: 08/02/15  8:35 AM  Result Value Ref Range Status   C Diff antigen POSITIVE (A) NEGATIVE Final   C Diff toxin POSITIVE (A) NEGATIVE Final   C Diff interpretation   Final    Positive for toxigenic C. difficile, active toxin production present.    Comment: CRITICAL RESULT CALLED TO, READ BACK BY AND VERIFIED WITH: DENISE FREEMAN 08/02/15 AT 1000 VKB    Management plans discussed with the patient, and patient's daughter over the phone. and  in they are agreement.  CODE STATUS:     Code Status Orders        Start     Ordered   07/31/15 2017  Full code   Continuous     07/31/15 2016      TOTAL TIME TAKING CARE OF THIS PATIENT: 35 minutes.    Loletha Grayer M.D on 08/03/2015 at 7:52 AM   Between 7am to 6pm - Pager - [phone]  After 6pm go to [email or web address] - password [email or web address] [place] [facility]  Office  [phone]  CC: Primary care physician; [name], MD

## 2015-08-02 NOTE — Progress Notes (Signed)
Patient ID: Zachary Brewer, male   DOB: Jan 20, 1941, 74 y.o.   MRN: [id number] [facility] PROGRESS NOTE  PCP: [name], MD  HPI/Subjective: Patient still with pain in the knee and limited motion. He states that he had a lot of diarrhea overnight. Daughter stated that the patient was constipated prior to coming into the hospital. The patient states that he had diarrhea at home also. Initially I felt the diarrhea secondary to colchicine that was started  Objective: Filed Vitals:   08/02/15 0832  BP: 123/62  Pulse: 48  Temp: 97.7 F (36.5 C)  Resp: 18    Filed Weights   07/31/15 0824 07/31/15 2007  Weight: 61.236 kg (135 lb) 63.186 kg (139 lb 4.8 oz)    ROS: Review of Systems  Constitutional: Negative for fever and chills.  Eyes: Negative for blurred vision.  Respiratory: Negative for cough and shortness of breath.   Cardiovascular: Negative for chest pain.  Gastrointestinal: Positive for diarrhea. Negative for nausea, vomiting, abdominal pain and constipation.  Genitourinary: Negative for dysuria.  Musculoskeletal: Positive for joint pain.  Neurological: Negative for dizziness and headaches.   Exam: Physical Exam  Constitutional: He is oriented to person, place, and time.  HENT:  Nose: No mucosal edema.  Mouth/Throat: No oropharyngeal exudate or posterior oropharyngeal edema.  Eyes: Conjunctivae, EOM and lids are normal. Pupils are equal, round, and reactive to light.  Neck: No JVD present. Carotid bruit is not present. No edema present. No thyroid mass and no thyromegaly present.  Cardiovascular: S1 normal and S2 normal.  Exam reveals no gallop.   No murmur heard. Pulses:      Dorsalis pedis pulses are 2+ on the right side, and 2+ on the left side.  Respiratory: No respiratory distress. He has no wheezes. He has no rhonchi. He has no rales.  GI: Soft. Bowel sounds are normal. There is no tenderness.  Musculoskeletal:       Left knee: He exhibits  decreased range of motion and swelling.       Right ankle: He exhibits no swelling.       Left ankle: He exhibits no swelling.  Poor range of motion left knee.  Lymphadenopathy:    He has no cervical adenopathy.  Neurological: He is alert and oriented to person, place, and time. No cranial nerve deficit.  Skin: Skin is warm. No rash noted. Nails show no clubbing.  Psychiatric: He has a normal mood and affect.    Data Reviewed: Basic Metabolic Panel:  Recent Labs Lab 07/29/15 1008 07/31/15 2055 08/01/15 0645  NA 138  --  140  K 3.1*  --  4.4  CL 99*  --  100*  CO2 33*  --  34*  GLUCOSE 125*  --  128*  BUN 19  --  22*  CREATININE 0.70 0.68 0.56*  CALCIUM 9.0  --  8.9   Liver Function Tests:  Recent Labs Lab 07/29/15 1008  AST 20  ALT 18  ALKPHOS 83  BILITOT 1.4*  PROT 6.9  ALBUMIN 3.4*   CBC:  Recent Labs Lab 07/29/15 1008 07/31/15 2055 08/01/15 0645  WBC 10.1 17.4* 16.4*  NEUTROABS 8.5*  --   --   HGB 14.4 13.2 13.2  HCT 42.2 39.7* 38.8*  MCV 90.3 90.6 90.4  PLT 192 282 277     Recent Results (from the past 240 hour(s))  Body fluid culture     Status: None (Preliminary result)   Collection  Time: 07/31/15 11:39 AM  Result Value Ref Range Status   Specimen Description KNEE  Final   Special Requests NONE  Final   Gram Stain MANY WBC SEEN NO ORGANISMS SEEN   Final   Culture NO GROWTH < 24 HOURS  Final   Report Status PENDING  Incomplete  C difficile quick scan w PCR reflex     Status: Abnormal   Collection Time: 08/02/15  8:35 AM  Result Value Ref Range Status   C Diff antigen POSITIVE (A) NEGATIVE Final   C Diff toxin POSITIVE (A) NEGATIVE Final   C Diff interpretation   Final    Positive for toxigenic C. difficile, active toxin production present.    Comment: CRITICAL RESULT CALLED TO, READ BACK BY AND VERIFIED WITH: DENISE FREEMAN 08/02/15 AT 1000 VKB      Studies: No results found.  Scheduled Meds: . enoxaparin (LOVENOX) injection  40  mg Subcutaneous Q24H  . metroNIDAZOLE  500 mg Oral 3 times per day  . multivitamin with minerals  1 tablet Oral Daily  . pantoprazole  40 mg Oral Daily  . potassium chloride  20 mEq Oral BID  . pravastatin  20 mg Oral q1800  . predniSONE  40 mg Oral Q breakfast   And  . predniSONE  30 mg Oral Q breakfast   And  . [START ON 08/03/2015] predniSONE  20 mg Oral Q breakfast   And  . [START ON 08/04/2015] predniSONE  10 mg Oral Q breakfast  . tamsulosin  0.4 mg Oral QPC breakfast    Assessment/Plan:  1. Diarrhea secondary to C. difficile colitis - start by mouth Flagyl 500 mg 3 times a day. I would like to see diarrhea slowed down a little bit prior to discharge. I will cancel  discharge todayand shoot for tomorrow. Social worker to update daughter on change of plan today. I spoke with her earlier today. 2. Pseudogout left knee, poor ambulation and mobility.- Continue prednisone taper. Colchicine. Likely out to rehabilitation tomorrow. 3. Essential hypertension- DC hydrochlorothiazide as this may induce gout 4. Hyperlipidemia unspecified continue pravastatin 5. Gastroesophageal reflux disease without esophagitis continue Protonix  Code Status:     Code Status Orders        Start     Ordered   07/31/15 2017  Full code   Continuous     07/31/15 2016     Disposition Plan: Likely at to rehabilitation tomorrow  Consultants:  Physical therapy  Time spent: 40 minutes  Loletha Grayer  [facility]

## 2015-08-03 DIAGNOSIS — E785 Hyperlipidemia, unspecified: Secondary | ICD-10-CM | POA: Diagnosis not present

## 2015-08-03 DIAGNOSIS — M25532 Pain in left wrist: Secondary | ICD-10-CM | POA: Diagnosis not present

## 2015-08-03 DIAGNOSIS — M25539 Pain in unspecified wrist: Secondary | ICD-10-CM | POA: Diagnosis not present

## 2015-08-03 DIAGNOSIS — R262 Difficulty in walking, not elsewhere classified: Secondary | ICD-10-CM | POA: Diagnosis not present

## 2015-08-03 DIAGNOSIS — M179 Osteoarthritis of knee, unspecified: Secondary | ICD-10-CM | POA: Diagnosis not present

## 2015-08-03 DIAGNOSIS — A047 Enterocolitis due to Clostridium difficile: Secondary | ICD-10-CM | POA: Diagnosis not present

## 2015-08-03 DIAGNOSIS — R531 Weakness: Secondary | ICD-10-CM | POA: Diagnosis not present

## 2015-08-03 DIAGNOSIS — B9689 Other specified bacterial agents as the cause of diseases classified elsewhere: Secondary | ICD-10-CM | POA: Diagnosis not present

## 2015-08-03 DIAGNOSIS — K210 Gastro-esophageal reflux disease with esophagitis: Secondary | ICD-10-CM | POA: Diagnosis not present

## 2015-08-03 DIAGNOSIS — M479 Spondylosis, unspecified: Secondary | ICD-10-CM | POA: Diagnosis not present

## 2015-08-03 DIAGNOSIS — M1000 Idiopathic gout, unspecified site: Secondary | ICD-10-CM | POA: Diagnosis not present

## 2015-08-03 DIAGNOSIS — M25562 Pain in left knee: Secondary | ICD-10-CM | POA: Diagnosis not present

## 2015-08-03 DIAGNOSIS — K219 Gastro-esophageal reflux disease without esophagitis: Secondary | ICD-10-CM | POA: Diagnosis not present

## 2015-08-03 DIAGNOSIS — M4806 Spinal stenosis, lumbar region: Secondary | ICD-10-CM | POA: Diagnosis not present

## 2015-08-03 DIAGNOSIS — L03116 Cellulitis of left lower limb: Secondary | ICD-10-CM | POA: Diagnosis not present

## 2015-08-03 DIAGNOSIS — M2540 Effusion, unspecified joint: Secondary | ICD-10-CM | POA: Diagnosis not present

## 2015-08-03 DIAGNOSIS — M11862 Other specified crystal arthropathies, left knee: Secondary | ICD-10-CM | POA: Diagnosis not present

## 2015-08-03 DIAGNOSIS — R278 Other lack of coordination: Secondary | ICD-10-CM | POA: Diagnosis not present

## 2015-08-03 DIAGNOSIS — M549 Dorsalgia, unspecified: Secondary | ICD-10-CM | POA: Diagnosis not present

## 2015-08-03 DIAGNOSIS — N400 Enlarged prostate without lower urinary tract symptoms: Secondary | ICD-10-CM | POA: Diagnosis not present

## 2015-08-03 DIAGNOSIS — R2681 Unsteadiness on feet: Secondary | ICD-10-CM | POA: Diagnosis not present

## 2015-08-03 DIAGNOSIS — M1990 Unspecified osteoarthritis, unspecified site: Secondary | ICD-10-CM | POA: Diagnosis not present

## 2015-08-03 DIAGNOSIS — I10 Essential (primary) hypertension: Secondary | ICD-10-CM | POA: Diagnosis not present

## 2015-08-03 DIAGNOSIS — M11869 Other specified crystal arthropathies, unspecified knee: Secondary | ICD-10-CM | POA: Diagnosis not present

## 2015-08-03 DIAGNOSIS — M79642 Pain in left hand: Secondary | ICD-10-CM | POA: Diagnosis not present

## 2015-08-03 DIAGNOSIS — M6281 Muscle weakness (generalized): Secondary | ICD-10-CM | POA: Diagnosis not present

## 2015-08-03 DIAGNOSIS — G8929 Other chronic pain: Secondary | ICD-10-CM | POA: Diagnosis not present

## 2015-08-03 DIAGNOSIS — R197 Diarrhea, unspecified: Secondary | ICD-10-CM | POA: Diagnosis not present

## 2015-08-03 DIAGNOSIS — K5900 Constipation, unspecified: Secondary | ICD-10-CM | POA: Diagnosis not present

## 2015-08-03 DIAGNOSIS — M25462 Effusion, left knee: Secondary | ICD-10-CM | POA: Diagnosis not present

## 2015-08-03 DIAGNOSIS — Z79899 Other long term (current) drug therapy: Secondary | ICD-10-CM | POA: Diagnosis not present

## 2015-08-03 DIAGNOSIS — M11262 Other chondrocalcinosis, left knee: Secondary | ICD-10-CM | POA: Diagnosis not present

## 2015-08-03 MED ORDER — METRONIDAZOLE 500 MG PO TABS: 500.0000 mg | ORAL_TABLET | Freq: Three times a day (TID) | ORAL | Status: DC

## 2015-08-03 NOTE — Progress Notes (Signed)
Patient is medically stable for D/C to [facility] today. Per Christine admissions coordinator at [place] patient is going to private room [facility] on [facility]. RN will call report and arrange EMS for transport. Clinical Education officer, museum (CSW) prepared D/C packet and sent D/C Summary to [employer] via [facility]finder. Regina from [place] is coming at 12 noon to complete admissions paper work with patient. RN will arrange EMS after paper work is completed. Patient is aware of above. CSW contacted patient's daughter [name] and made her aware of above. Please reconsult if future social work needs arise. CSW signing off.   Blima Rich, [place] [phone]

## 2015-08-03 NOTE — Progress Notes (Signed)
Patient ID: DANZELL BIRKY, male   DOB: 08/28/1941, 74 y.o.   MRN: [id number]  Patient seen and examined. Diagnosed with C. difficile colitis yesterday and discharge was canceled. Patient had one episode of diarrhea last night and one this morning of loose stool. Continue 14 day course of Flagyl. I will discharge to rehabilitation today.  I edited the discharge summary dictated yesterday and changed to date to today. Please see discharge summary for updated physical exam.  Time spent on discharge 35 minutes. Greater than 50% of time spent in coordination of care and counseling.

## 2015-08-03 NOTE — Clinical Social Work Placement (Signed)
   CLINICAL SOCIAL WORK PLACEMENT  NOTE  Date:  08/03/2015  Patient Details  Name: Zachary Brewer MRN: [id number] Date of Birth: Jan 12, 1941  Clinical Social Work is seeking post-discharge placement for this patient at the [place] level of care (*CSW will initial, date and re-position this form in  chart as items are completed):  Yes   Patient/family provided with [place] Work Department's list of facilities offering this level of care within the geographic area requested by the patient (or if unable, by the patient's family).  Yes   Patient/family informed of their freedom to choose among providers that offer the needed level of care, that participate in Medicare, Medicaid or managed care program needed by the patient, have an available bed and are willing to accept the patient.  Yes   Patient/family informed of [facility]'s ownership interest in [facility] and [facility], as well as of the fact that they are under no obligation to receive care at these facilities.  PASRR submitted to EDS on 08/01/15     PASRR number received on 08/01/15     Existing PASRR number confirmed on       FL2 transmitted to all facilities in geographic area requested by pt/family on 08/01/15     FL2 transmitted to all facilities within larger geographic area on       Patient informed that his/her managed care company has contracts with or will negotiate with certain facilities, including the following:        Yes   Patient/family informed of bed offers received.  Patient chooses bed at  [facility] )     Physician recommends and patient chooses bed at      Patient to be transferred to  [facility] ) on 08/03/15.  Patient to be transferred to facility by  [facility] )     Patient family notified on 08/03/15 of transfer.  Name of family member notified:   (Daughter [name] is aware of D/C. )     PHYSICIAN       Additional Comment:     _______________________________________________ Loralyn Freshwater, LCSW 08/03/2015, 10:06 AM

## 2015-08-03 NOTE — Progress Notes (Signed)
Spoke with Clelia Schaumann, [facility] rep at [phone], to notify of non-emergent EMS transport.  Auth notification reference given as [id number].   Service date range good from 08/04/15 - 11/01/15.   Gap exception requested to determine if services can be considered at an in-network level.

## 2015-08-04 LAB — BODY FLUID CULTURE: Culture: NO GROWTH

## 2015-08-06 ENCOUNTER — Encounter: Payer: Self-pay | Admitting: Nurse Practitioner

## 2015-08-06 ENCOUNTER — Non-Acute Institutional Stay (SKILLED_NURSING_FACILITY): Payer: Medicare Other | Admitting: Nurse Practitioner

## 2015-08-06 DIAGNOSIS — M1990 Unspecified osteoarthritis, unspecified site: Secondary | ICD-10-CM

## 2015-08-06 DIAGNOSIS — I10 Essential (primary) hypertension: Secondary | ICD-10-CM | POA: Diagnosis not present

## 2015-08-06 DIAGNOSIS — K219 Gastro-esophageal reflux disease without esophagitis: Secondary | ICD-10-CM

## 2015-08-06 DIAGNOSIS — N400 Enlarged prostate without lower urinary tract symptoms: Secondary | ICD-10-CM | POA: Diagnosis not present

## 2015-08-06 DIAGNOSIS — M11869 Other specified crystal arthropathies, unspecified knee: Secondary | ICD-10-CM | POA: Diagnosis not present

## 2015-08-06 DIAGNOSIS — E785 Hyperlipidemia, unspecified: Secondary | ICD-10-CM | POA: Diagnosis not present

## 2015-08-06 DIAGNOSIS — A047 Enterocolitis due to Clostridium difficile: Secondary | ICD-10-CM | POA: Diagnosis not present

## 2015-08-06 DIAGNOSIS — A0472 Enterocolitis due to Clostridium difficile, not specified as recurrent: Secondary | ICD-10-CM

## 2015-08-06 DIAGNOSIS — M11269 Other chondrocalcinosis, unspecified knee: Secondary | ICD-10-CM

## 2015-08-06 LAB — ACID FAST SMEAR+CULTURE W/RFLX (ARMC ONLY)
ACID FAST CULTURE: NEGATIVE
Acid Fast Smear: NEGATIVE

## 2015-08-06 NOTE — Progress Notes (Signed)
Patient ID: Zachary Brewer, male   DOB: 08-11-1941, 74 y.o.   MRN: [id number]    Nursing Home Location:  [place] of Service: SNF ([phone])  PCP: [name], MD  No Known Allergies  Chief Complaint  Patient presents with  . Hospitalization Follow-up    Follow-up from the hospital     HPI:  Patient is a 74 y.o. male seen today at [facility] for follow up hospitalization. Pt with a pmh of GERD, hyperlipidemia, arthritis who was hospitalized  from 10/4-10/6 due to pseudogout and c diff. The ER physician did an aspiration of the left knee and the patient was found to have pseudogout. The patient was started on colchicine and prednisone but once diarrhea started colchicine was stopped and prednisone continued. Pt reporting pain is not controlled. Reports pain is a 10/10 in bilateral shoulders, hips and knees prior to getting medication and medication brings pain down to 5/10 but does not last long enough.  Feels like he is burdening the staff asking for pain medication all the time. No side effects noted from medication.  Pt was also diagnosed  with C diff. Pt had been recently treated with antibiotics for pneumonia. Pt reports he has not had diarrhea in 3 days and bowel movements are back to baseline soft stool. No fevers or chills or abdominal pain.   Review of Systems:  Review of Systems  Constitutional: Negative for activity change, appetite change, fatigue and unexpected weight change.  Eyes: Negative.   Respiratory: Negative for cough and shortness of breath.   Cardiovascular: Negative for chest pain, palpitations and leg swelling.  Gastrointestinal: Negative for abdominal pain, diarrhea, constipation and abdominal distention.  Genitourinary: Negative for dysuria and difficulty urinating.  Musculoskeletal: Positive for back pain and arthralgias. Negative for myalgias.  Skin: Negative for color change and wound.  Neurological: Negative for  dizziness and weakness.  Psychiatric/Behavioral: Negative for behavioral problems, confusion and agitation.    Past Medical History  Diagnosis Date  . Allergy   . GERD (gastroesophageal reflux disease)   . Hyperlipidemia   . Hypertension   . Arthritis 05/30/2015  . 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 Cervical pain 05/30/2015   Past Surgical History  Procedure Laterality Date  . Gastric outlet obstruction release  2013  . 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 Upper gi endoscopy    . Esophagogastroduodenoscopy (egd) with propofol N/A 06/20/2015    Procedure: ESOPHAGOGASTRODUODENOSCOPY (EGD) WITH PROPOFOL with dialation;  Surgeon: Lucilla Lame, MD;  Location: [place];  Service: Endoscopy;  Laterality: N/A;  . Flexible bronchoscopy N/A 06/22/2015    Procedure: FLEXIBLE BRONCHOSCOPY;  Surgeon: Allyne Gee, MD;  Location: [facility];  Service: Pulmonary;  Laterality: N/A;   Social History:   reports that he has never smoked. He has never used smokeless tobacco. He reports that he does not drink alcohol or use illicit drugs.  Family History  Problem Relation Age of Onset  . Diabetes Mother   . Heart disease Father   . Diabetes Sister   . Diabetes Brother   . Stroke Brother   . Mental illness Brother   . Hypertension Paternal Grandfather     Medications: Patient's Medications  New Prescriptions   No medications on file  Previous Medications   ALBUTEROL (PROVENTIL HFA;VENTOLIN HFA) 108 (90 BASE) MCG/ACT INHALER    Inhale 1-2 puffs into the lungs every 6 (six) hours as needed for  wheezing or shortness of breath.   LOVASTATIN (MEVACOR) 40 MG TABLET    Take 0.5 tablets (20 mg total) by mouth at bedtime.   METRONIDAZOLE (FLAGYL) 500 MG TABLET    Take 1 tablet (500 mg total) by mouth 3 (three) times daily.   MULTIPLE VITAMIN (MULTIVITAMIN) TABLET    Take 1 tablet by mouth daily.   OMEPRAZOLE (PRILOSEC) 20 MG CAPSULE    Take 1 capsule (20 mg total) by mouth 2 (two) times daily before a meal.    OXYCODONE-ACETAMINOPHEN (ROXICET) 5-325 MG TABLET    Take 1 tablet by mouth every 6 (six) hours as needed.   PREDNISONE (DELTASONE) 5 MG TABLET    5 tabs day1; 4 tabs day2; 3 tabs day3; 2 tabs day4; 1 tab day5; 1/2 tab day6,7   TAMSULOSIN (FLOMAX) 0.4 MG CAPS CAPSULE    Take 1 capsule (0.4 mg total) by mouth daily after breakfast.  Modified Medications   No medications on file  Discontinued Medications   No medications on file     Physical Exam: Filed Vitals:   08/06/15 1400  BP: 100/53  Pulse: 63  Temp: 97.6 F (36.4 C)  TempSrc: Oral  Resp: 16  SpO2: 96%    Physical Exam  Constitutional: He is oriented to person, place, and time. He appears well-developed and well-nourished. No distress.  HENT:  Head: Normocephalic and atraumatic.  Eyes: Conjunctivae and EOM are normal. Pupils are equal, round, and reactive to light.  Neck: Normal range of motion. Neck supple.  Cardiovascular: Normal rate, regular rhythm and normal heart sounds.   Pulmonary/Chest: Effort normal and breath sounds normal.  Abdominal: Soft. Bowel sounds are normal.  Musculoskeletal: He exhibits tenderness. He exhibits no edema.  Tenderness to bilateral shoulders and knees with decrease ROM to bilateral knee  Neurological: He is alert and oriented to person, place, and time.  Skin: Skin is warm and dry. He is not diaphoretic.  Psychiatric: He has a normal mood and affect.    Labs reviewed: Basic Metabolic Panel:  Recent Labs  06/21/15 0447 07/29/15 1008 07/31/15 2055 08/01/15 0645  NA 139 138  --  140  K 3.5 3.1*  --  4.4  CL 99* 99*  --  100*  CO2 31 33*  --  34*  GLUCOSE 113* 125*  --  128*  BUN 13 19  --  22*  CREATININE 0.84 0.70 0.68 0.56*  CALCIUM 8.8* 9.0  --  8.9   Liver Function Tests:  Recent Labs  07/29/15 1008  AST 20  ALT 18  ALKPHOS 83  BILITOT 1.4*  PROT 6.9  ALBUMIN 3.4*   No results for input(s): LIPASE, AMYLASE in the last 8760 hours. No results for input(s):  AMMONIA in the last 8760 hours. CBC:  Recent Labs  07/29/15 1008 07/31/15 2055 08/01/15 0645  WBC 10.1 17.4* 16.4*  NEUTROABS 8.5*  --   --   HGB 14.4 13.2 13.2  HCT 42.2 39.7* 38.8*  MCV 90.3 90.6 90.4  PLT 192 282 277   TSH: No results for input(s): TSH in the last 8760 hours. A1C: Lab Results  Component Value Date   HGBA1C 5.4 06/23/2015   Lipid Panel: No results for input(s): CHOL, HDL, LDLCALC, TRIG, CHOLHDL, LDLDIRECT in the last 8760 hours.     Assessment/Plan 1. Pseudogout of knee, unspecified laterality -conts on prednisone taper but still have pain. Medication increased as below in numnber 2. May need additional ortho follow up.  2. Arthritis -reports worsening pain to shoulders, hips and knees, currently pain regimen not effective. Will schedule oxycodone/apap 5-325 mg q 6 hours and may have additional tablet for breakthrough pain.   3. Gastroesophageal reflux disease without esophagitis Stable, conts on omeprazole 20 mg daily   4. Essential (primary) hypertension Stable, not currently on medications, will monitor   5. BPH (benign prostatic hyperplasia) -stable, conts on flomax 0.4 mg daily  6. Hyperlipidemia conts on mevacor 40 mg daily  7. Enteritis due to Clostridium difficile conts on 14 day course of flagyl -pt reports diarrhea has improved -will follow up CBC and BMP   Katoya Amato K. Harle Battiest  [facility] [phone] 8 am - 5 pm) [phone] (after hours)

## 2015-08-07 LAB — BASIC METABOLIC PANEL
BUN: 13 mg/dL (ref 4–21)
CREATININE: 0.6 mg/dL (ref 0.6–1.3)
GLUCOSE: 97 mg/dL
Potassium: 4.1 mmol/L (ref 3.4–5.3)
Sodium: 136 mmol/L — AB (ref 137–147)

## 2015-08-07 LAB — CBC AND DIFFERENTIAL
HCT: 42 % (ref 41–53)
Hemoglobin: 13.6 g/dL (ref 13.5–17.5)
PLATELETS: 387 10*3/uL (ref 150–399)
WBC: 15.7 10^3/mL

## 2015-08-08 ENCOUNTER — Non-Acute Institutional Stay (SKILLED_NURSING_FACILITY): Payer: Medicare Other | Admitting: Nurse Practitioner

## 2015-08-08 DIAGNOSIS — M25532 Pain in left wrist: Secondary | ICD-10-CM | POA: Diagnosis not present

## 2015-08-08 DIAGNOSIS — M11869 Other specified crystal arthropathies, unspecified knee: Secondary | ICD-10-CM | POA: Diagnosis not present

## 2015-08-08 DIAGNOSIS — M11269 Other chondrocalcinosis, unspecified knee: Secondary | ICD-10-CM

## 2015-08-08 NOTE — Progress Notes (Signed)
Patient ID: Zachary Brewer, male   DOB: [birth year], 74 y.o.   MRN: [id number]      Full Code Nursing Home Location:  [facility]   Place of Service:  SNF  PCP: [name], MD  No Known Allergies  Chief Complaint  Patient presents with  . Acute Visit    Complains of Pain    HPI:  Patient is a 74 y.o. male seen today at [facility] at the request of nursing due to overwhelming pain. Pts pain is not controlled with current percocet order of scheduled percocet 5/325 mg q 6 hours with additional dose q 6 hours as needed. increased pain to left wrist. Pt reports it feels like he has slammed in against something but he has not. No injury noted but swelling and pain to wrist and fingers. Uric acid in hospital of 4.9. Dx with pseudogout to knee. Pain in knee has improved.   Review of Systems:  Review of Systems  Constitutional: Negative for activity change, appetite change, fatigue and unexpected weight change.  Eyes: Negative.   Respiratory: Negative for cough and shortness of breath.   Cardiovascular: Negative for chest pain, palpitations and leg swelling.  Gastrointestinal: Negative for diarrhea and constipation.  Musculoskeletal: Positive for back pain, joint swelling and arthralgias. Negative for myalgias.       Increased pain and swelling to left hand and wrist  Neurological: Negative for weakness.    Past Medical History  Diagnosis Date  . Allergy   . GERD (gastroesophageal reflux disease)   . Hyperlipidemia   . Hypertension   . Arthritis 05/30/2015  . 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 Cervical pain 05/30/2015   Past Surgical History  Procedure Laterality Date  . Gastric outlet obstruction release  2013  . 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 Upper gi endoscopy    . Esophagogastroduodenoscopy (egd) with propofol N/A 06/20/2015    Procedure: ESOPHAGOGASTRODUODENOSCOPY (EGD) WITH PROPOFOL with dialation;  Surgeon: Lucilla Lame, MD;  Location:  [place];  Service: Endoscopy;  Laterality: N/A;  . Flexible bronchoscopy N/A 06/22/2015    Procedure: FLEXIBLE BRONCHOSCOPY;  Surgeon: Allyne Gee, MD;  Location: [facility];  Service: Pulmonary;  Laterality: N/A;   Social History:   reports that he has never smoked. He has never used smokeless tobacco. He reports that he does not drink alcohol or use illicit drugs.  Family History  Problem Relation Age of Onset  . Diabetes Mother   . Heart disease Father   . Diabetes Sister   . Diabetes Brother   . Stroke Brother   . Mental illness Brother   . Hypertension Paternal Grandfather     Medications: Patient's Medications  New Prescriptions   No medications on file  Previous Medications   ALBUTEROL (PROVENTIL HFA;VENTOLIN HFA) 108 (90 BASE) MCG/ACT INHALER    Inhale 1-2 puffs into the lungs every 6 (six) hours as needed for wheezing or shortness of breath.   ATORVASTATIN (LIPITOR) 10 MG TABLET    Take 10 mg by mouth daily.   METRONIDAZOLE (FLAGYL) 500 MG TABLET    Take 1 tablet (500 mg total) by mouth 3 (three) times daily.   MULTIPLE VITAMIN (MULTIVITAMIN) TABLET    Take 1 tablet by mouth daily.   OMEPRAZOLE (PRILOSEC) 20 MG CAPSULE    Take 1 capsule (20 mg total) by mouth 2 (two) times daily before a meal.   OXYCODONE-ACETAMINOPHEN (ROXICET) 5-325 MG TABLET  Take 1 tablet by mouth every 6 (six) hours as needed.   PREDNISONE (DELTASONE) 5 MG TABLET    5 tabs day1; 4 tabs day2; 3 tabs day3; 2 tabs day4; 1 tab day5; 1/2 tab day6,7   TAMSULOSIN (FLOMAX) 0.4 MG CAPS CAPSULE    Take 1 capsule (0.4 mg total) by mouth daily after breakfast.  Modified Medications   No medications on file  Discontinued Medications   No medications on file     Physical Exam: Filed Vitals:   08/08/15 1204  BP: 120/64  Pulse: 75  Temp: 97.1 F (36.2 C)  TempSrc: Oral  Resp: 18  Height: 5\' 7"  (1.702 m)  Weight: 139 lb (63.05 kg)  SpO2: 93%    Physical Exam  Constitutional: He is  oriented to person, place, and time. He appears well-developed and well-nourished. No distress.  Cardiovascular: Normal rate, regular rhythm and normal heart sounds.   Pulmonary/Chest: Effort normal and breath sounds normal.  Abdominal: Soft. Bowel sounds are normal.  Musculoskeletal: He exhibits tenderness. He exhibits no edema.  Tenderness to bilateral shoulders and knees with decrease ROM to bilateral knee Now with increased tenderness to left wrist, decreased ROM   Neurological: He is alert and oriented to person, place, and time.  Skin: Skin is warm and dry. He is not diaphoretic.  Psychiatric: He has a normal mood and affect.    Labs reviewed: Basic Metabolic Panel:  Recent Labs  06/21/15 0447 07/29/15 1008 07/31/15 2055 08/01/15 0645  NA 139 138  --  140  K 3.5 3.1*  --  4.4  CL 99* 99*  --  100*  CO2 31 33*  --  34*  GLUCOSE 113* 125*  --  128*  BUN 13 19  --  22*  CREATININE 0.84 0.70 0.68 0.56*  CALCIUM 8.8* 9.0  --  8.9   Liver Function Tests:  Recent Labs  07/29/15 1008  AST 20  ALT 18  ALKPHOS 83  BILITOT 1.4*  PROT 6.9  ALBUMIN 3.4*   No results for input(s): LIPASE, AMYLASE in the last 8760 hours. No results for input(s): AMMONIA in the last 8760 hours. CBC:  Recent Labs  07/29/15 1008 07/31/15 2055 08/01/15 0645  WBC 10.1 17.4* 16.4*  NEUTROABS 8.5*  --   --   HGB 14.4 13.2 13.2  HCT 42.2 39.7* 38.8*  MCV 90.3 90.6 90.4  PLT 192 282 277   TSH: No results for input(s): TSH in the last 8760 hours. A1C: Lab Results  Component Value Date   HGBA1C 5.4 06/23/2015   Lipid Panel: No results for input(s): CHOL, HDL, LDLCALC, TRIG, CHOLHDL, LDLDIRECT in the last 8760 hours.    Assessment/Plan 1. Left wrist pain -appears to be inflammatory process, will get xray of left hand at this time -will increase prednisone to 60 mg daily for 3 days then to reduce by 10 mg daily until complete -to dc previous percocet orders and start oxycodone IR  5-10 mg q 3 hour PRN pain -will also get rheumatology consult   2. Pseudogout of knee, unspecified laterality -improved pain at this time. Cont with therapy and pain management.    Carlos American. Harle Battiest  [facility] [phone] 8 am - 5 pm) [phone] (after hours)

## 2015-08-09 ENCOUNTER — Non-Acute Institutional Stay (SKILLED_NURSING_FACILITY): Payer: Medicare Other | Admitting: Internal Medicine

## 2015-08-09 ENCOUNTER — Encounter: Payer: Self-pay | Admitting: Internal Medicine

## 2015-08-09 DIAGNOSIS — R531 Weakness: Secondary | ICD-10-CM | POA: Diagnosis not present

## 2015-08-09 DIAGNOSIS — M2540 Effusion, unspecified joint: Secondary | ICD-10-CM | POA: Diagnosis not present

## 2015-08-09 DIAGNOSIS — K219 Gastro-esophageal reflux disease without esophagitis: Secondary | ICD-10-CM | POA: Diagnosis not present

## 2015-08-09 DIAGNOSIS — K5900 Constipation, unspecified: Secondary | ICD-10-CM

## 2015-08-09 DIAGNOSIS — D72829 Elevated white blood cell count, unspecified: Secondary | ICD-10-CM

## 2015-08-09 DIAGNOSIS — B9689 Other specified bacterial agents as the cause of diseases classified elsewhere: Secondary | ICD-10-CM | POA: Diagnosis not present

## 2015-08-09 DIAGNOSIS — N400 Enlarged prostate without lower urinary tract symptoms: Secondary | ICD-10-CM | POA: Diagnosis not present

## 2015-08-09 DIAGNOSIS — A498 Other bacterial infections of unspecified site: Secondary | ICD-10-CM

## 2015-08-09 DIAGNOSIS — M11262 Other chondrocalcinosis, left knee: Secondary | ICD-10-CM

## 2015-08-09 DIAGNOSIS — E785 Hyperlipidemia, unspecified: Secondary | ICD-10-CM

## 2015-08-09 DIAGNOSIS — M11862 Other specified crystal arthropathies, left knee: Secondary | ICD-10-CM | POA: Diagnosis not present

## 2015-08-09 NOTE — Progress Notes (Signed)
Patient ID: Zachary Brewer, male   DOB: 03-Mar-1941, 74 y.o.   MRN: [id number]    [facility]  PCP: [name], MD  Code Status: Full Code   No Known Allergies  Chief Complaint  Patient presents with  . New Admit To SNF    New Admission      HPI:  74 y.o. patient is here for short term rehabilitation post hospital admission from 07/31/15-08/02/15 with pseudogout of left knee confirmed by joint aspiration. He was started onc olchicine and prednisone. He developed diarrhea thought to be from colchicine but was later diagnosed to be positive for c.diff. He has PMH of HLD, GERD, BPH, HTN among others. He is seen in his room today. He has acute pain in his left wrist and left ankle. He had xray of left wrist and hand which rules out acute abnormality. Xray ruled out fracture and joint effusion and shows osteopenia on review.  Review of Systems:  Constitutional: Negative for fever, chills, diaphoresis.  HENT: Negative for headache, congestion, nasal discharge, difficulty swallowing.   Eyes: Negative for eye pain, blurred vision, double vision and discharge.  Respiratory: Negative for cough, shortness of breath and wheezing.   Cardiovascular: Negative for chest pain, palpitation.  Gastrointestinal: Negative for heartburn, nausea, vomiting, abdominal pain. No bowel movement for 4 days. Last one on Monday Genitourinary: Negative for dysuria and flank pain.  Musculoskeletal: Negative for back pain, falls in facility Skin: Negative for itching, rash.  Neurological: Negative for dizziness, tingling, focal weakness Psychiatric/Behavioral: Negative for depression   Past Medical History  Diagnosis Date  . Allergy   . GERD (gastroesophageal reflux disease)   . Hyperlipidemia   . Hypertension   . Arthritis 05/30/2015  . 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 Cervical pain 05/30/2015   Past Surgical History  Procedure Laterality Date  . Gastric outlet obstruction release  2013  .  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 Upper gi endoscopy    . Esophagogastroduodenoscopy (egd) with propofol N/A 06/20/2015    Procedure: ESOPHAGOGASTRODUODENOSCOPY (EGD) WITH PROPOFOL with dialation;  Surgeon: Lucilla Lame, MD;  Location: [place];  Service: Endoscopy;  Laterality: N/A;  . Flexible bronchoscopy N/A 06/22/2015    Procedure: FLEXIBLE BRONCHOSCOPY;  Surgeon: Allyne Gee, MD;  Location: [facility];  Service: Pulmonary;  Laterality: N/A;   Social History:   reports that he has never smoked. He has never used smokeless tobacco. He reports that he does not drink alcohol or use illicit drugs.  Family History  Problem Relation Age of Onset  . Diabetes Mother   . Heart disease Father   . Diabetes Sister   . Diabetes Brother   . Stroke Brother   . Mental illness Brother   . Hypertension Paternal Grandfather     Medications:   Medication List       This list is accurate as of: 08/09/15 11:09 AM.  Always use your most recent med list.               albuterol 108 (90 BASE) MCG/ACT inhaler  Commonly known as:  PROVENTIL HFA;VENTOLIN HFA  Inhale 1-2 puffs into the lungs every 6 (six) hours as needed for wheezing or shortness of breath.     atorvastatin 10 MG tablet  Commonly known as:  LIPITOR  Take 10 mg by mouth daily.     metroNIDAZOLE 500 MG tablet  Commonly known as:  FLAGYL  Take 1 tablet (500 mg  total) by mouth 3 (three) times daily.     multivitamin tablet  Take 1 tablet by mouth daily.     omeprazole 20 MG capsule  Commonly known as:  PRILOSEC  Take 1 capsule (20 mg total) by mouth 2 (two) times daily before a meal.     oxyCODONE 5 MG immediate release tablet  Commonly known as:  Oxy IR/ROXICODONE  1-2 by mouth every 3 hours as needed for pain     predniSONE 5 MG tablet  Commonly known as:  DELTASONE  60 mg daily x 3 days, 50 mg daily x 1 day, 40 mg daily x 1 day,30 mg daily x 1 day, 29 mg daily x 1 day, 10 mg daily x 1 day     tamsulosin 0.4  MG Caps capsule  Commonly known as:  FLOMAX  Take 1 capsule (0.4 mg total) by mouth daily after breakfast.         Physical Exam: Filed Vitals:   08/09/15 1102  BP: 121/77  Pulse: 90  Temp: 99.7 F (37.6 C)  TempSrc: Oral  Resp: 22  SpO2: 96%    General- elderly male, thin built, in no acute distress Head- normocephalic, atraumatic Throat- moist mucus membrane Eyes- PERRLA, EOMI, no pallor, no icterus, no discharge, normal conjunctiva, normal sclera Neck- no cervical lymphadenopathy Cardiovascular- normal s1,s2, no murmurs Respiratory- bilateral clear to auscultation, no wheeze, no rhonchi, no crackles, no use of accessory muscles Abdomen- bowel sounds present, soft, non tender Musculoskeletal- able to move all 4 extremities, generalized weakness, left wrist swelling and limited ROM with both flexion and extension. Left hand unable to straighten the palm, mild erythema and mild swelling in hand, limited ROM of left fingers. Left ankle swelling with mild warmth and redness noted. No skin breakdown Neurological- no focal deficit, alert and oriented to person, place and time Skin- warm and dry Psychiatry- normal mood and affect    Labs reviewed: Basic Metabolic Panel:  Recent Labs  06/21/15 0447 07/29/15 1008 07/31/15 2055 08/01/15 0645 08/07/15  NA 139 138  --  140 136*  K 3.5 3.1*  --  4.4 4.1  CL 99* 99*  --  100*  --   CO2 31 33*  --  34*  --   GLUCOSE 113* 125*  --  128*  --   BUN 13 19  --  22* 13  CREATININE 0.84 0.70 0.68 0.56* 0.6  CALCIUM 8.8* 9.0  --  8.9  --    Liver Function Tests:  Recent Labs  07/29/15 1008  AST 20  ALT 18  ALKPHOS 83  BILITOT 1.4*  PROT 6.9  ALBUMIN 3.4*   No results for input(s): LIPASE, AMYLASE in the last 8760 hours. No results for input(s): AMMONIA in the last 8760 hours. CBC:  Recent Labs  07/29/15 1008 07/31/15 2055 08/01/15 0645 08/07/15  WBC 10.1 17.4* 16.4* 15.7  NEUTROABS 8.5*  --   --   --   HGB 14.4  13.2 13.2 13.6  HCT 42.2 39.7* 38.8* 42  MCV 90.3 90.6 90.4  --   PLT 192 282 277 387     Assessment/Plan  Generalized weakness Will have him work with physical therapy and occupational therapy team to help with gait training and muscle strengthening exercises.fall precautions. Skin care. Encourage to be out of bed.   Left knee pseudogout Continue prednisone course as below, rheumatology consult  Multiple joint swelling With pain and erythema. D/c current prednisone order and have  him on prednisone 50 mg po daily for 5 days for now, will need rheumatology to see patient ASAP. No joint effusion on exam but with joint tenderness and swelling with need further workup. Afebrile. Monitor cbc with diff. Continue oxycodone ir 5 mg q3h prn pain  C.diff colitis Continue flagyl 500 mg po tid for total 2 weeks course. No bowel movement for 4 days. Start colace 100 mg bid for now and hydration encouraged  Leukocytosis With him on steroid, possible leukocytosis. Check cbc with diff to assess for left shift. Monitor clinically  Constipation Has hx of gastric outlet obstruction. Start colace for now as above and monitor  HLD Continue statin  gerd Continue prilosec 20 mg bid  BPH Continue flomax  HTN Stable, off hctz with psuedogout. Monitor bp reading on daily basis for now.    Goals of care: short term rehabilitation   Labs/tests ordered: cbc with diff 08/13/15  Family/ staff Communication: reviewed care plan with patient and nursing supervisor    Blanchie Serve, MD  [place] [phone] (Monday-Friday 8 am - 5 pm) [phone] (afterhours)

## 2015-08-13 LAB — CBC AND DIFFERENTIAL
HCT: 40 % — AB (ref 41–53)
Hemoglobin: 13.3 g/dL — AB (ref 13.5–17.5)
Platelets: 512 10*3/uL — AB (ref 150–399)
WBC: 11.8 10*3/mL

## 2015-08-20 ENCOUNTER — Encounter: Payer: Self-pay | Admitting: Nurse Practitioner

## 2015-08-20 ENCOUNTER — Non-Acute Institutional Stay (SKILLED_NURSING_FACILITY): Payer: Medicare Other | Admitting: Nurse Practitioner

## 2015-08-20 DIAGNOSIS — M11862 Other specified crystal arthropathies, left knee: Secondary | ICD-10-CM | POA: Diagnosis not present

## 2015-08-20 DIAGNOSIS — M6281 Muscle weakness (generalized): Secondary | ICD-10-CM | POA: Diagnosis not present

## 2015-08-20 DIAGNOSIS — B9689 Other specified bacterial agents as the cause of diseases classified elsewhere: Secondary | ICD-10-CM | POA: Diagnosis not present

## 2015-08-20 DIAGNOSIS — M25562 Pain in left knee: Secondary | ICD-10-CM | POA: Diagnosis not present

## 2015-08-20 DIAGNOSIS — M1990 Unspecified osteoarthritis, unspecified site: Secondary | ICD-10-CM | POA: Diagnosis not present

## 2015-08-20 DIAGNOSIS — R2681 Unsteadiness on feet: Secondary | ICD-10-CM | POA: Diagnosis not present

## 2015-08-20 DIAGNOSIS — N400 Enlarged prostate without lower urinary tract symptoms: Secondary | ICD-10-CM

## 2015-08-20 DIAGNOSIS — M1000 Idiopathic gout, unspecified site: Secondary | ICD-10-CM | POA: Diagnosis not present

## 2015-08-20 DIAGNOSIS — K219 Gastro-esophageal reflux disease without esophagitis: Secondary | ICD-10-CM | POA: Diagnosis not present

## 2015-08-20 DIAGNOSIS — K5900 Constipation, unspecified: Secondary | ICD-10-CM

## 2015-08-20 DIAGNOSIS — M11262 Other chondrocalcinosis, left knee: Secondary | ICD-10-CM

## 2015-08-20 DIAGNOSIS — R262 Difficulty in walking, not elsewhere classified: Secondary | ICD-10-CM | POA: Diagnosis not present

## 2015-08-20 DIAGNOSIS — A498 Other bacterial infections of unspecified site: Secondary | ICD-10-CM

## 2015-08-20 NOTE — Progress Notes (Signed)
Nursing Home Location:  [facility]   Place of Service: SNF ([phone])  PCP: [name], MD  No Known Allergies  Chief Complaint  Patient presents with  . Discharge Note    HPI:  Patient is a 74 y.o. male seen today at [facility] for discharge home. He has PMH of HLD, GERD, BPH, HTN. Pt is at [facility] for short term rehabilitation post hospital admission from 07/31/15-08/02/15 with pseudogout of left knee confirmed by joint aspiration. He was started on colchicine and prednisone. He developed diarrhea thought to be from colchicine but was later diagnosed to be positive for c.diff. Pt treated for c diff and without ongoing diarrhea. During rehab stay remained in a lot of pain to multiple joints. Pain medication increased with good effects. rheumatology consult placed. Patient currently doing well with therapy, now stable to discharge home with home health.  Review of Systems:  Review of Systems  Constitutional: Negative for activity change, appetite change, fatigue and unexpected weight change.  HENT: Negative for congestion and hearing loss.   Eyes: Negative.   Respiratory: Negative for cough and shortness of breath.   Cardiovascular: Negative for chest pain, palpitations and leg swelling.  Gastrointestinal: Negative for abdominal pain, diarrhea and constipation.  Genitourinary: Negative for dysuria and difficulty urinating.  Musculoskeletal: Positive for back pain, joint swelling and arthralgias. Negative for myalgias.       Pain to multiple joints, no overwhelming or uncontrolled pain at this time.   Skin: Negative for color change and wound.  Neurological: Negative for dizziness and weakness.  Psychiatric/Behavioral: Negative for behavioral problems, confusion and agitation.    Past Medical History  Diagnosis Date  . Allergy   . GERD (gastroesophageal reflux disease)   . Hyperlipidemia   . Hypertension   . Arthritis 05/30/2015  .  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 Cervical pain 05/30/2015   Past Surgical History  Procedure Laterality Date  . Gastric outlet obstruction release  2013  . 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 Upper gi endoscopy    . Esophagogastroduodenoscopy (egd) with propofol N/A 06/20/2015    Procedure: ESOPHAGOGASTRODUODENOSCOPY (EGD) WITH PROPOFOL with dialation;  Surgeon: Lucilla Lame, MD;  Location: [facility];  Service: Endoscopy;  Laterality: N/A;  . Flexible bronchoscopy N/A 06/22/2015    Procedure: FLEXIBLE BRONCHOSCOPY;  Surgeon: Allyne Gee, MD;  Location: [facility];  Service: Pulmonary;  Laterality: N/A;   Social History:   reports that he has never smoked. He has never used smokeless tobacco. He reports that he does not drink alcohol or use illicit drugs.  Family History  Problem Relation Age of Onset  . Diabetes Mother   . Heart disease Father   . Diabetes Sister   . Diabetes Brother   . Stroke Brother   . Mental illness Brother   . Hypertension Paternal Grandfather     Medications: Patient's Medications  New Prescriptions   No medications on file  Previous Medications   ALBUTEROL (PROVENTIL HFA;VENTOLIN HFA) 108 (90 BASE) MCG/ACT INHALER    Inhale 1-2 puffs into the lungs every 6 (six) hours as needed for wheezing or shortness of breath.   ATORVASTATIN (LIPITOR) 10 MG TABLET    Take 10 mg by mouth at bedtime.    MULTIPLE VITAMIN (MULTIVITAMIN) TABLET    Take 1 tablet by mouth daily.   OMEPRAZOLE (PRILOSEC) 20 MG CAPSULE    Take 1 capsule (20 mg total) by  mouth 2 (two) times daily before a meal.   OXYCODONE-ACETAMINOPHEN (PERCOCET/ROXICET) 5-325 MG TABLET    Take 1 tablet by mouth every 6 (six) hours as needed for severe pain.   TAMSULOSIN (FLOMAX) 0.4 MG CAPS CAPSULE    Take 1 capsule (0.4 mg total) by mouth daily after breakfast.  Modified Medications   No medications on file  Discontinued Medications   METRONIDAZOLE (FLAGYL) 500 MG TABLET    Take 1 tablet (500 mg total) by  mouth 3 (three) times daily.   OXYCODONE (OXY IR/ROXICODONE) 5 MG IMMEDIATE RELEASE TABLET    1-2 by mouth every 3 hours as needed for pain   PREDNISONE (DELTASONE) 5 MG TABLET    60 mg daily x 3 days, 50 mg daily x 1 day, 40 mg daily x 1 day,30 mg daily x 1 day, 29 mg daily x 1 day, 10 mg daily x 1 day     Physical Exam: Filed Vitals:   08/20/15 1202  BP: 108/77  Pulse: 82  Temp: 98.3 F (36.8 C)  Resp: 20  Weight: 130 lb 12.8 oz (59.33 kg)  SpO2: 96%    Physical Exam  Constitutional: He is oriented to person, place, and time. He appears well-developed and well-nourished. No distress.  HENT:  Mouth/Throat: Oropharynx is clear and moist. No oropharyngeal exudate.  Eyes: Pupils are equal, round, and reactive to light.  Neck: Neck supple.  Cardiovascular: Normal rate, regular rhythm and normal heart sounds.   Pulmonary/Chest: Effort normal and breath sounds normal.  Abdominal: Soft. Bowel sounds are normal.  Musculoskeletal: He exhibits no edema or tenderness.  Neurological: He is alert and oriented to person, place, and time.  Skin: Skin is warm and dry. He is not diaphoretic.  Psychiatric: He has a normal mood and affect.    Labs reviewed: Basic Metabolic Panel:  Recent Labs  06/21/15 0447 07/29/15 1008 07/31/15 2055 08/01/15 0645 08/07/15  NA 139 138  --  140 136*  K 3.5 3.1*  --  4.4 4.1  CL 99* 99*  --  100*  --   CO2 31 33*  --  34*  --   GLUCOSE 113* 125*  --  128*  --   BUN 13 19  --  22* 13  CREATININE 0.84 0.70 0.68 0.56* 0.6  CALCIUM 8.8* 9.0  --  8.9  --    Liver Function Tests:  Recent Labs  07/29/15 1008  AST 20  ALT 18  ALKPHOS 83  BILITOT 1.4*  PROT 6.9  ALBUMIN 3.4*   No results for input(s): LIPASE, AMYLASE in the last 8760 hours. No results for input(s): AMMONIA in the last 8760 hours. CBC:  Recent Labs  07/29/15 1008 07/31/15 2055 08/01/15 0645 08/07/15 08/13/15  WBC 10.1 17.4* 16.4* 15.7 11.8  NEUTROABS 8.5*  --   --   --    --   HGB 14.4 13.2 13.2 13.6 13.3*  HCT 42.2 39.7* 38.8* 42 40*  MCV 90.3 90.6 90.4  --   --   PLT 192 282 277 387 512*   TSH: No results for input(s): TSH in the last 8760 hours. A1C: Lab Results  Component Value Date   HGBA1C 5.4 06/23/2015   Lipid Panel: No results for input(s): CHOL, HDL, LDLCALC, TRIG, CHOLHDL, LDLDIRECT in the last 8760 hours.  Radiological Exams: 08/09/15  Left hand, wrist Findings: Mild osteopenia is seen. Degenerative changes are seen at the first carpometacarpal joint.  Impression: No acute osseous abnormality  is seen.  Assessment/Plan 1. Pseudogout of knee, left Completed prednisone. Rheumatology consult to follow as outpatient.   2. Clostridium difficile infection No further diarrhea, completed 2 week course of flagyl.   3. Constipation, unspecified constipation type stalbe at this time, conts on colace 100 mg twice daily  4. BPH (benign prostatic hyperplasia) Stable on flomax.   5. Arthritis Ongoing pain in multiple joints, rheumatology referral placed and will need follow up.  -cont oxycodone IR 5 mg q 3 hours PRN pain   6. Gastroesophageal reflux disease, esophagitis presence not specified Stable, cont on prilosec 20 mg twice daily   pt is stable for discharge-will need PT/OT/HHA per home health. No DME needed. Rx written.  will need to follow up with PCP within 2 weeks.   Carlos American. Harle Battiest  [facility] [phone] 8 am - 5 pm) [phone] (after hours)

## 2015-08-22 DIAGNOSIS — E785 Hyperlipidemia, unspecified: Secondary | ICD-10-CM | POA: Diagnosis not present

## 2015-08-22 DIAGNOSIS — M549 Dorsalgia, unspecified: Secondary | ICD-10-CM | POA: Diagnosis not present

## 2015-08-22 DIAGNOSIS — Z8619 Personal history of other infectious and parasitic diseases: Secondary | ICD-10-CM | POA: Diagnosis not present

## 2015-08-22 DIAGNOSIS — M11862 Other specified crystal arthropathies, left knee: Secondary | ICD-10-CM | POA: Diagnosis not present

## 2015-08-22 DIAGNOSIS — Z8719 Personal history of other diseases of the digestive system: Secondary | ICD-10-CM | POA: Diagnosis not present

## 2015-08-22 DIAGNOSIS — G8929 Other chronic pain: Secondary | ICD-10-CM | POA: Diagnosis not present

## 2015-08-22 DIAGNOSIS — M6281 Muscle weakness (generalized): Secondary | ICD-10-CM | POA: Diagnosis not present

## 2015-08-22 DIAGNOSIS — K219 Gastro-esophageal reflux disease without esophagitis: Secondary | ICD-10-CM | POA: Diagnosis not present

## 2015-08-22 DIAGNOSIS — I10 Essential (primary) hypertension: Secondary | ICD-10-CM | POA: Diagnosis not present

## 2015-08-22 DIAGNOSIS — Z79891 Long term (current) use of opiate analgesic: Secondary | ICD-10-CM | POA: Diagnosis not present

## 2015-08-22 DIAGNOSIS — M1990 Unspecified osteoarthritis, unspecified site: Secondary | ICD-10-CM | POA: Diagnosis not present

## 2015-08-24 ENCOUNTER — Ambulatory Visit
Admission: EM | Admit: 2015-08-24 | Discharge: 2015-08-24 | Disposition: A | Discharge status: Home / Self Care | Payer: Medicare Other | Attending: Family Medicine | Admitting: Family Medicine

## 2015-08-24 ENCOUNTER — Encounter: Payer: Self-pay | Admitting: *Deleted

## 2015-08-24 ENCOUNTER — Telehealth: Payer: Self-pay | Admitting: Family Medicine

## 2015-08-24 DIAGNOSIS — M2550 Pain in unspecified joint: Secondary | ICD-10-CM

## 2015-08-24 DIAGNOSIS — M11262 Other chondrocalcinosis, left knee: Secondary | ICD-10-CM

## 2015-08-24 DIAGNOSIS — M11242 Other chondrocalcinosis, left hand: Secondary | ICD-10-CM

## 2015-08-24 DIAGNOSIS — M11862 Other specified crystal arthropathies, left knee: Secondary | ICD-10-CM | POA: Diagnosis not present

## 2015-08-24 DIAGNOSIS — Z8619 Personal history of other infectious and parasitic diseases: Secondary | ICD-10-CM | POA: Diagnosis not present

## 2015-08-24 DIAGNOSIS — M6281 Muscle weakness (generalized): Secondary | ICD-10-CM | POA: Diagnosis not present

## 2015-08-24 DIAGNOSIS — I10 Essential (primary) hypertension: Secondary | ICD-10-CM | POA: Diagnosis not present

## 2015-08-24 DIAGNOSIS — M549 Dorsalgia, unspecified: Secondary | ICD-10-CM | POA: Diagnosis not present

## 2015-08-24 DIAGNOSIS — G8929 Other chronic pain: Secondary | ICD-10-CM | POA: Diagnosis not present

## 2015-08-24 DIAGNOSIS — Z79891 Long term (current) use of opiate analgesic: Secondary | ICD-10-CM | POA: Diagnosis not present

## 2015-08-24 DIAGNOSIS — Z8719 Personal history of other diseases of the digestive system: Secondary | ICD-10-CM | POA: Diagnosis not present

## 2015-08-24 DIAGNOSIS — K219 Gastro-esophageal reflux disease without esophagitis: Secondary | ICD-10-CM | POA: Diagnosis not present

## 2015-08-24 DIAGNOSIS — M1990 Unspecified osteoarthritis, unspecified site: Secondary | ICD-10-CM | POA: Diagnosis not present

## 2015-08-24 DIAGNOSIS — E785 Hyperlipidemia, unspecified: Secondary | ICD-10-CM | POA: Diagnosis not present

## 2015-08-24 MED ORDER — METHYLPREDNISOLONE SODIUM SUCC 125 MG IJ SOLR
125.0000 mg | Freq: Once | INTRAMUSCULAR | Status: AC
  Administered 2015-08-24: 125 mg via INTRAMUSCULAR

## 2015-08-24 MED ORDER — COLCHICINE 0.6 MG PO TABS: 0.6000 mg | ORAL_TABLET | Freq: Every day | ORAL | Status: DC

## 2015-08-24 MED ORDER — COLCHICINE 0.6 MG PO TABS
0.6000 mg | ORAL_TABLET | Freq: Once | ORAL | Status: AC
  Administered 2015-08-24: 0.6 mg via ORAL

## 2015-08-24 MED ORDER — KETOROLAC TROMETHAMINE 30 MG/ML IJ SOLN
30.0000 mg | Freq: Once | INTRAMUSCULAR | Status: AC
  Administered 2015-08-24: 30 mg via INTRAMUSCULAR

## 2015-08-24 NOTE — Discharge Instructions (Signed)
Calcium Pyrophosphate Deposition  Calcium pyrophosphate deposition (CPPD), which is also called pseudogout, is a type of arthritis that causes pain, swelling, and inflammation in a joint. The joint pain can be severe and may last for days. If it is not treated, the pain may last much longer. Attacks of CPPD may come and go. This condition usually affects one joint at a time. The joints that are affected most commonly are the knees, but this condition can also affect the wrists, elbows, shoulders, or ankles. CPPD is similar to gout. Both conditions result from the buildup of crystals in the joint. However, CPPD is caused by a type of crystal that is different than the crystals that cause gout. CAUSES This condition is caused by the buildup of calcium pyrophosphate dihydrate crystals in the joint. The reason why this buildup occurs is not known. The condition may be passed down from parent to child (hereditary). RISK FACTORS This condition is more likely to develop in people who:  Are over 65 years old.  Have a family history of the condition.  Have had joint replacement surgery.  Have had a recent injury.  Have certain medical conditions, such as hemophilia, ochronosis, amyloidosis, or hormonal disorders.  Have low blood magnesium levels. SYMPTOMS Symptoms of this condition include:  Pain in a joint. The pain may:  Be intense and constant.  Come on quickly.  Get worse with movement.  Last from several days to a few weeks.  Redness, swelling, and warmth at the joint.  Stiffness of the joint. DIAGNOSIS To diagnose this condition, your health care provider will use a needle to remove fluid from the joint. The fluid will be examined under a microscope to check for the crystals that cause CPPD. You may also have imaging tests, such as:  X-rays.  Ultrasound. TREATMENT There is no way to remove the crystals from the joint and no way to cure this condition. However, treatment can  relieve symptoms and improve joint function. Treatment may include:  Nonsteroidal anti-inflammatory drugs (NSAIDs) to reduce inflammation and pain.  Medicines to help prevent attacks.  Injections of medicine (cortisone) into the joint to reduce pain and swelling.  Physical therapy to improve joint function. HOME CARE INSTRUCTIONS  Take medicines only as directed by your health care provider.  Rest the affected joints until your symptoms start to go away.  Keep your affected joints raised (elevated) when possible. This will help to reduce swelling.  If directed, apply ice to the affected area:  Put ice in a plastic bag.  Place a towel between your skin and the bag.  Leave the ice on for 20 minutes, 2-3 times per day.  If the painful joint is in your leg, use crutches as directed by your health care provider.  When your symptoms start to go away, begin to exercise regularly or do physical therapy. Talk with your health care provider or physical therapist about what types of exercise are safe for you. Low-impact exercise may be best. This includes walking, swimming, bicycling, and water aerobics.  Maintain a healthy weight so your joints do not need to bear more weight than necessary. SEEK MEDICAL CARE IF:  You have an increase in joint pain that is not relieved with medicine.  Your joint becomes more red, swollen, or stiff.  You have a fever.  You have a skin rash.   This information is not intended to replace advice given to you by your health care provider. Make sure you discuss  any questions you have with your health care provider.   Document Released: 07/05/2004 Document Revised: 02/27/2015 Document Reviewed: 09/20/2014 Elsevier Interactive Patient Education 2016 Elsevier Inc.  Joint Pain Joint pain can be caused by many things. The joint can be bruised, infected, weak from aging, or sore from exercise. The pain will probably go away if you follow your doctor's  instructions for home care. If your joint pain continues, more tests may be needed to help find the cause of your condition. HOME CARE Watch your condition for any changes. Follow these instructions as told to lessen the pain that you are feeling:  Take medicines only as told by your doctor.  Rest the sore joint for as long as told by your doctor. If your doctor tells you to, raise (elevate) the painful joint above the level of your heart while you are sitting or lying down.  Do not do things that cause pain or make the pain worse.  If told, put ice on the painful area:  Put ice in a plastic bag.  Place a towel between your skin and the bag.  Leave the ice on for 20 minutes, 2-3 times per day.  Wear an elastic bandage, splint, or sling as told by your doctor. Loosen the bandage or splint if your fingers or toes lose feeling (become numb) and tingle, or if they turn cold and blue.  Begin exercising or stretching the joint as told by your doctor. Ask your doctor what types of exercise are safe for you.  Keep all follow-up visits as told by your doctor. This is important. GET HELP IF:  Your pain gets worse and medicine does not help it.  Your joint pain does not get better in 3 days.  You have more bruising or swelling.  You have a fever.  You lose 10 pounds (4.5 kg) or more without trying. GET HELP RIGHT AWAY IF:  You are not able to move the joint.  Your fingers or toes become numb or they turn cold and blue.   This information is not intended to replace advice given to you by your health care provider. Make sure you discuss any questions you have with your health care provider.   Document Released: 10/01/2009 Document Revised: 11/03/2014 Document Reviewed: 07/25/2014 Elsevier Interactive Patient Education [employer].

## 2015-08-24 NOTE — ED Provider Notes (Signed)
CSN: [id number]     Arrival date & time 08/24/15  1611 History   First MD Initiated Contact with Patient 08/24/15 1749    Nurses notes were reviewed.  Chief Complaint  Patient presents with  . Medication Refill    Should be noted the patient has been in the hospital several times in the last few months. He was in a rehabilitation facility his daughter now has a home but not last 20 448 hours she developed pain in both his left knee left hand and foot. States that he is unable to walk now unable to ambulate. Physical therapy cannot help him today and his ambulation or doing anything as far as ambulating him. His daughter is concerned because she started to give him a pured diet due to of impairment of the feeding tube and history of aspiration. Also she's worried by giving him towards protein with a history of gout but the same time he is very malnourished. He has a history of diabetes and hypertension for both those have been under control since he has not been eating that well. She has appointment see his PCP in about 2 weeks feels very frustrated because he was sent home on no medication other than Flomax 0.4 mg she states when he was in the hospital he did get colchicine and waning that the hospital he was able to ambulate. sider location/radiation/quality/duration/timing/severity/associated sxs/prior Treatment) HPI  Past Medical History  Diagnosis Date  . Allergy   . GERD (gastroesophageal reflux disease)   . Hyperlipidemia   . Hypertension   . Arthritis 05/30/2015  . 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 Cervical pain 05/30/2015   Past Surgical History  Procedure Laterality Date  . Gastric outlet obstruction release  2013  . 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 Upper gi endoscopy    . Esophagogastroduodenoscopy (egd) with propofol N/A 06/20/2015    Procedure: ESOPHAGOGASTRODUODENOSCOPY (EGD) WITH PROPOFOL with dialation;  Surgeon: Lucilla Lame, MD;  Location: [facility];  Service: Endoscopy;   Laterality: N/A;  . Flexible bronchoscopy N/A 06/22/2015    Procedure: FLEXIBLE BRONCHOSCOPY;  Surgeon: Allyne Gee, MD;  Location: [facility];  Service: Pulmonary;  Laterality: N/A;   Family History  Problem Relation Age of Onset  . Diabetes Mother   . Heart disease Father   . Diabetes Sister   . Diabetes Brother   . Stroke Brother   . Mental illness Brother   . Hypertension Paternal Grandfather    Social History  Substance Use Topics  . Smoking status: Never Smoker   . Smokeless tobacco: Never Used  . Alcohol Use: No    Review of Systems  Unable to perform ROS: Age    Allergies  Review of patient's allergies indicates no known allergies.  Home Medications   Prior to Admission medications   Medication Sig Start Date End Date Taking? Authorizing Provider  docusate sodium (COLACE) 100 MG capsule Take 100 mg by mouth daily as needed for mild constipation.   Yes Historical Provider, MD  oxyCODONE (OXY IR/ROXICODONE) 5 MG immediate release tablet Take 5 mg by mouth every 4 (four) hours as needed for severe pain.   Yes Historical Provider, MD  albuterol (PROVENTIL HFA;VENTOLIN HFA) 108 (90 BASE) MCG/ACT inhaler Inhale 1-2 puffs into the lungs every 6 (six) hours as needed for wheezing or shortness of breath. 07/24/15   Arlis Porta., MD  atorvastatin (LIPITOR) 10 MG tablet Take 10 mg by mouth at bedtime.  Historical Provider, MD  colchicine 0.6 MG tablet Take 1 tablet (0.6 mg total) by mouth daily. 08/24/15   Frederich Cha, MD  Multiple Vitamin (MULTIVITAMIN) tablet Take 1 tablet by mouth daily.    Historical Provider, MD  omeprazole (PRILOSEC) 20 MG capsule Take 1 capsule (20 mg total) by mouth 2 (two) times daily before a meal. 07/24/15   Arlis Porta., MD  oxyCODONE-acetaminophen (PERCOCET/ROXICET) 5-325 MG tablet Take 1 tablet by mouth every 6 (six) hours as needed for severe pain.    Historical Provider, MD  tamsulosin (FLOMAX) 0.4 MG CAPS capsule Take 1 capsule  (0.4 mg total) by mouth daily after breakfast. 07/24/15   Arlis Porta., MD   Meds Ordered and Administered this Visit   Medications  methylPREDNISolone sodium succinate (SOLU-MEDROL) 125 mg/2 mL injection 125 mg (not administered)  colchicine tablet 0.6 mg (not administered)  ketorolac (TORADOL) 30 MG/ML injection 30 mg (not administered)    BP 100/68 mmHg  Pulse 109  Temp(Src) 99.8 F (37.7 C) (Tympanic)  Ht 5\' 7"  (1.702 m)  Wt 140 lb (63.504 kg)  BMI 21.92 kg/m2  SpO2 98% No data found.   Physical Exam  Constitutional: He is oriented to person, place, and time.  Emaciated white male  HENT:  Head: Normocephalic and atraumatic.  Right Ear: External ear normal.  Left Ear: External ear normal.  Eyes: Pupils are equal, round, and reactive to light.  Neck: Neck supple.  Musculoskeletal: He exhibits tenderness.  He has swollen left knee and left hand.  Neurological: He is alert and oriented to person, place, and time.  Skin: Skin is warm. No rash noted. There is erythema.  Psychiatric: He has a normal mood and affect.  Vitals reviewed.   ED Course  Procedures (including critical care time)  Labs Review Labs Reviewed - No data to display  Imaging Review No results found.   Visual Acuity Review  Right Eye Distance:   Left Eye Distance:   Bilateral Distance:    Right Eye Near:   Left Eye Near:    Bilateral Near:       Extensive time spent looking at patients hospital chart.  MDM   1. Pseudogout of knee, left   2. Joint pain   3. Pseudogout of hand, left    Extensive time spent looking at patients hospital chart. Will give Solu-Medrol 125 mg IM, Toradol 30 mg IM and colchicine 0.6 mg orally. Within give him a prescription for colchicine 0.61 g orally as well. Explained to his daughter that looking at the lab work acid aspiration of his knee the crystals came back were calcium phosphate and his uric acid is only for which indicate that this gentleman has  pseudogout and not true gout. In checking up-to-date culture seen is still indicated as well as anti-inflammatories because of his age anti-inflammatories pressure orally is going to be of concern and he does have a feeding tube as well. We'll give only 30 mg 1 time of Toradol because extensive amount of inflammation and pain that he's having. I will also give him a dose Solu-Medrol 125 to help with inflammation. K function the hospital looked good with GFR greater than 60% to which is related which is  reassuring. He has a previous PCP in about 2 weeks. Explained to his daughter that Uloric and allopurinol would not be effective since the pseudogout for him   Frederich Cha, MD 08/24/15 [phone]

## 2015-08-24 NOTE — Telephone Encounter (Signed)
Pt was released for rehab on 25th and has follow up with Dr. Luan Pulling on Nov 9.  Daughter, [name] said he is in pain and he was only given 5 mg of oxycodone.  She said it was barely touching his pain.  His left foot is swelling and she asked for an anti inflammatory to prevent crystals from forming because of gout.  Her call back number is [phone]

## 2015-08-24 NOTE — ED Notes (Signed)
Pt states that he has been on Colchine, ran out of medication on Tuesday, Gout is in hands and knees

## 2015-08-27 DIAGNOSIS — M11862 Other specified crystal arthropathies, left knee: Secondary | ICD-10-CM | POA: Diagnosis not present

## 2015-08-27 DIAGNOSIS — M1990 Unspecified osteoarthritis, unspecified site: Secondary | ICD-10-CM | POA: Diagnosis not present

## 2015-08-27 DIAGNOSIS — M6281 Muscle weakness (generalized): Secondary | ICD-10-CM | POA: Diagnosis not present

## 2015-08-27 DIAGNOSIS — K219 Gastro-esophageal reflux disease without esophagitis: Secondary | ICD-10-CM | POA: Diagnosis not present

## 2015-08-27 DIAGNOSIS — G8929 Other chronic pain: Secondary | ICD-10-CM | POA: Diagnosis not present

## 2015-08-27 DIAGNOSIS — I10 Essential (primary) hypertension: Secondary | ICD-10-CM | POA: Diagnosis not present

## 2015-08-27 DIAGNOSIS — E785 Hyperlipidemia, unspecified: Secondary | ICD-10-CM | POA: Diagnosis not present

## 2015-08-27 DIAGNOSIS — M549 Dorsalgia, unspecified: Secondary | ICD-10-CM | POA: Diagnosis not present

## 2015-08-27 DIAGNOSIS — Z79891 Long term (current) use of opiate analgesic: Secondary | ICD-10-CM | POA: Diagnosis not present

## 2015-08-27 DIAGNOSIS — Z8619 Personal history of other infectious and parasitic diseases: Secondary | ICD-10-CM | POA: Diagnosis not present

## 2015-08-27 DIAGNOSIS — Z8719 Personal history of other diseases of the digestive system: Secondary | ICD-10-CM | POA: Diagnosis not present

## 2015-08-28 ENCOUNTER — Telehealth: Payer: Self-pay | Admitting: Family Medicine

## 2015-08-28 DIAGNOSIS — M1990 Unspecified osteoarthritis, unspecified site: Secondary | ICD-10-CM | POA: Diagnosis not present

## 2015-08-28 DIAGNOSIS — E785 Hyperlipidemia, unspecified: Secondary | ICD-10-CM | POA: Diagnosis not present

## 2015-08-28 DIAGNOSIS — M11862 Other specified crystal arthropathies, left knee: Secondary | ICD-10-CM | POA: Diagnosis not present

## 2015-08-28 DIAGNOSIS — I10 Essential (primary) hypertension: Secondary | ICD-10-CM | POA: Diagnosis not present

## 2015-08-28 DIAGNOSIS — G8929 Other chronic pain: Secondary | ICD-10-CM | POA: Diagnosis not present

## 2015-08-28 DIAGNOSIS — Z8619 Personal history of other infectious and parasitic diseases: Secondary | ICD-10-CM | POA: Diagnosis not present

## 2015-08-28 DIAGNOSIS — Z79891 Long term (current) use of opiate analgesic: Secondary | ICD-10-CM | POA: Diagnosis not present

## 2015-08-28 DIAGNOSIS — M6281 Muscle weakness (generalized): Secondary | ICD-10-CM | POA: Diagnosis not present

## 2015-08-28 DIAGNOSIS — K219 Gastro-esophageal reflux disease without esophagitis: Secondary | ICD-10-CM | POA: Diagnosis not present

## 2015-08-28 DIAGNOSIS — M549 Dorsalgia, unspecified: Secondary | ICD-10-CM | POA: Diagnosis not present

## 2015-08-28 DIAGNOSIS — Z8719 Personal history of other diseases of the digestive system: Secondary | ICD-10-CM | POA: Diagnosis not present

## 2015-08-28 NOTE — Telephone Encounter (Signed)
Patient needs rheumatology referral. Appt has been changed to 08/30/15.[place]

## 2015-08-28 NOTE — Telephone Encounter (Signed)
Thayer Headings called patient on 08/24/2015 and asked them to go to [facility]

## 2015-08-28 NOTE — Telephone Encounter (Signed)
Pt  daughter call Pam, states she had to cancel pt appt with the rheumatologist at  [facility] was told that Dr. Luan Pulling office will need to call with another Auth?? For appt. pt.daughter pam have also requested no Monday  Appt. T-F 1:00-4:00  Pam call back # is [phone].

## 2015-08-29 DIAGNOSIS — M1990 Unspecified osteoarthritis, unspecified site: Secondary | ICD-10-CM | POA: Diagnosis not present

## 2015-08-29 DIAGNOSIS — Z8719 Personal history of other diseases of the digestive system: Secondary | ICD-10-CM | POA: Diagnosis not present

## 2015-08-29 DIAGNOSIS — Z8619 Personal history of other infectious and parasitic diseases: Secondary | ICD-10-CM | POA: Diagnosis not present

## 2015-08-29 DIAGNOSIS — M6281 Muscle weakness (generalized): Secondary | ICD-10-CM | POA: Diagnosis not present

## 2015-08-29 DIAGNOSIS — E785 Hyperlipidemia, unspecified: Secondary | ICD-10-CM | POA: Diagnosis not present

## 2015-08-29 DIAGNOSIS — G8929 Other chronic pain: Secondary | ICD-10-CM | POA: Diagnosis not present

## 2015-08-29 DIAGNOSIS — Z79891 Long term (current) use of opiate analgesic: Secondary | ICD-10-CM | POA: Diagnosis not present

## 2015-08-29 DIAGNOSIS — M11862 Other specified crystal arthropathies, left knee: Secondary | ICD-10-CM | POA: Diagnosis not present

## 2015-08-29 DIAGNOSIS — M549 Dorsalgia, unspecified: Secondary | ICD-10-CM | POA: Diagnosis not present

## 2015-08-29 DIAGNOSIS — K219 Gastro-esophageal reflux disease without esophagitis: Secondary | ICD-10-CM | POA: Diagnosis not present

## 2015-08-29 DIAGNOSIS — I10 Essential (primary) hypertension: Secondary | ICD-10-CM | POA: Diagnosis not present

## 2015-08-30 ENCOUNTER — Ambulatory Visit (INDEPENDENT_AMBULATORY_CARE_PROVIDER_SITE_OTHER): Payer: Medicare Other | Admitting: Family Medicine

## 2015-08-30 ENCOUNTER — Encounter: Payer: Self-pay | Admitting: Family Medicine

## 2015-08-30 ENCOUNTER — Ambulatory Visit
Admission: RE | Admit: 2015-08-30 | Discharge: 2015-08-30 | Disposition: A | Discharge status: Home / Self Care | Payer: Medicare Other | Source: Ambulatory Visit | Attending: Family Medicine | Admitting: Family Medicine

## 2015-08-30 VITALS — BP 113/64 | HR 57 | Temp 98.2°F | Resp 16 | Ht 67.0 in | Wt 129.8 lb

## 2015-08-30 DIAGNOSIS — E440 Moderate protein-calorie malnutrition: Secondary | ICD-10-CM

## 2015-08-30 DIAGNOSIS — M6281 Muscle weakness (generalized): Secondary | ICD-10-CM | POA: Diagnosis not present

## 2015-08-30 DIAGNOSIS — M549 Dorsalgia, unspecified: Secondary | ICD-10-CM | POA: Diagnosis not present

## 2015-08-30 DIAGNOSIS — M1990 Unspecified osteoarthritis, unspecified site: Secondary | ICD-10-CM

## 2015-08-30 DIAGNOSIS — G8929 Other chronic pain: Secondary | ICD-10-CM | POA: Diagnosis not present

## 2015-08-30 DIAGNOSIS — I10 Essential (primary) hypertension: Secondary | ICD-10-CM

## 2015-08-30 DIAGNOSIS — M11262 Other chondrocalcinosis, left knee: Secondary | ICD-10-CM

## 2015-08-30 DIAGNOSIS — J690 Pneumonitis due to inhalation of food and vomit: Secondary | ICD-10-CM

## 2015-08-30 DIAGNOSIS — Z79891 Long term (current) use of opiate analgesic: Secondary | ICD-10-CM | POA: Diagnosis not present

## 2015-08-30 DIAGNOSIS — K311 Adult hypertrophic pyloric stenosis: Secondary | ICD-10-CM

## 2015-08-30 DIAGNOSIS — E785 Hyperlipidemia, unspecified: Secondary | ICD-10-CM | POA: Diagnosis not present

## 2015-08-30 DIAGNOSIS — K219 Gastro-esophageal reflux disease without esophagitis: Secondary | ICD-10-CM | POA: Diagnosis not present

## 2015-08-30 DIAGNOSIS — M11862 Other specified crystal arthropathies, left knee: Secondary | ICD-10-CM | POA: Diagnosis not present

## 2015-08-30 DIAGNOSIS — Z8619 Personal history of other infectious and parasitic diseases: Secondary | ICD-10-CM | POA: Diagnosis not present

## 2015-08-30 DIAGNOSIS — Z8719 Personal history of other diseases of the digestive system: Secondary | ICD-10-CM | POA: Diagnosis not present

## 2015-08-30 DIAGNOSIS — J189 Pneumonia, unspecified organism: Secondary | ICD-10-CM | POA: Insufficient documentation

## 2015-08-30 MED ORDER — FUROSEMIDE 20 MG PO TABS: ORAL_TABLET | ORAL | Status: DC

## 2015-08-30 MED ORDER — PREDNISONE 10 MG PO TABS: ORAL_TABLET | ORAL | Status: DC

## 2015-08-30 NOTE — Progress Notes (Signed)
Name: Zachary Brewer   MRN: [id number]    DOB: 10/20/1941   Date:08/30/2015       Progress Note  Subjective  Chief Complaint  Chief Complaint  Patient presents with  . nursing home f/u  . Referral    Rheumatology  . 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  Has gastric outlet obstruction and had aspiration pneumonia with last GI procedure. Needs further dilitation. No problem-specific assessment & plan notes found for this encounter.   Past Medical History  Diagnosis Date  . Allergy   . GERD (gastroesophageal reflux disease)   . Hyperlipidemia   . Hypertension   . Arthritis 05/30/2015  . 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 Cervical pain 05/30/2015    Social History  Substance Use Topics  . Smoking status: Never Smoker   . Smokeless tobacco: Never Used  . Alcohol Use: No     Current outpatient prescriptions:  .  albuterol (PROVENTIL HFA;VENTOLIN HFA) 108 (90 BASE) MCG/ACT inhaler, Inhale 1-2 puffs into the lungs every 6 (six) hours as needed for wheezing or shortness of breath., Disp: 1 Inhaler, Rfl: 12 .  colchicine 0.6 MG tablet, Take 1 tablet (0.6 mg total) by mouth daily., Disp: 30 tablet, Rfl: 0 .  docusate sodium (COLACE) 100 MG capsule, Take 100 mg by mouth daily as needed for mild constipation., Disp: , Rfl:  .  Multiple Vitamin (MULTIVITAMIN) tablet, Take 1 tablet by mouth daily., Disp: , Rfl:  .  omeprazole (PRILOSEC) 20 MG capsule, Take 1 capsule (20 mg total) by mouth 2 (two) times daily before a meal., Disp: 60 capsule, Rfl: 12 .  oxyCODONE (OXY IR/ROXICODONE) 5 MG immediate release tablet, Take 5 mg by mouth every 4 (four) hours as needed for severe pain., Disp: , Rfl:  .  oxyCODONE-acetaminophen (PERCOCET/ROXICET) 5-325 MG tablet, Take 1 tablet by mouth every 6 (six) hours as needed for severe pain., Disp: , Rfl:  .  tamsulosin (FLOMAX) 0.4 MG CAPS capsule, Take 1 capsule (0.4 mg total) by mouth  daily after breakfast., Disp: 30 capsule, Rfl: 12  No Known Allergies  Review of Systems  Constitutional: Positive for weight loss and malaise/fatigue. Negative for fever and chills.  HENT: Negative for hearing loss.   Eyes: Negative for blurred vision and double vision.  Respiratory: Negative for cough, sputum production, shortness of breath and wheezing.   Cardiovascular: Negative for chest pain, palpitations, orthopnea and leg swelling.  Gastrointestinal: Positive for heartburn and diarrhea. Negative for abdominal pain and blood in stool.  Genitourinary: Negative for dysuria, urgency and frequency.  Musculoskeletal: Positive for joint pain (L wrist and R ankle painful and swollen).  Neurological: Positive for weakness. Negative for headaches.      Objective  Filed Vitals:   08/30/15 0953  BP: 113/64  Pulse: 57  Temp: 98.2 F (36.8 C)  TempSrc: Oral  Resp: 16  Height: '5\' 7"'  (1.702 m)  Weight: 129 lb 12.8 oz (58.877 kg)     Physical Exam  Constitutional: He is oriented to person, place, and time and well-developed, well-nourished, and in no distress. No distress.  HENT:  Head: Normocephalic and atraumatic.  Cardiovascular: Normal rate, regular rhythm, normal heart sounds and intact distal pulses.  Exam reveals no gallop and no friction rub.   No murmur heard. Pulmonary/Chest: Effort normal and breath sounds normal. No respiratory distress. He has no wheezes. He has  no rales.  Abdominal: Soft. He exhibits no distension. There is no tenderness.  Musculoskeletal:  L wrist tender and swollen with pain with ROM and palpation, esp at base of thumb.   L ankle swollen to lower 1/3 of ankle.  Mild pain with ROM but 2 + edema of ankle. L knee with some pain with ROM.  Decreased ROM  Neurological: He is alert and oriented to person, place, and time.      Recent Results (from the past 2160 hour(s))  CBC     Status: None   Collection Time: 06/20/15 11:30 AM  Result Value Ref  Range   WBC 4.2 3.8 - 10.6 K/uL   RBC 5.01 4.40 - 5.90 MIL/uL   Hemoglobin 15.8 13.0 - 18.0 g/dL   HCT 45.7 40.0 - 52.0 %   MCV 91.1 80.0 - 100.0 fL   MCH 31.5 26.0 - 34.0 pg   MCHC 34.6 32.0 - 36.0 g/dL   RDW 13.5 11.5 - 14.5 %   Platelets 157 150 - 440 K/uL  Basic metabolic panel     Status: Abnormal   Collection Time: 06/20/15 11:30 AM  Result Value Ref Range   Sodium 141 135 - 145 mmol/L   Potassium 3.4 (L) 3.5 - 5.1 mmol/L   Chloride 96 (L) 101 - 111 mmol/L   CO2 34 (H) 22 - 32 mmol/L   Glucose, Bld 129 (H) 65 - 99 mg/dL   BUN 12 6 - 20 mg/dL   Creatinine, Ser 0.72 0.61 - 1.24 mg/dL   Calcium 9.0 8.9 - 10.3 mg/dL   GFR calc non Af Amer >60 >60 mL/min   GFR calc Af Amer >60 >60 mL/min    Comment: (NOTE) The eGFR has been calculated using the CKD EPI equation. This calculation has not been validated in all clinical situations. eGFR's persistently <60 mL/min signify possible Chronic Kidney Disease.    Anion gap 11 5 - 15  Basic metabolic panel     Status: Abnormal   Collection Time: 06/21/15  4:47 AM  Result Value Ref Range   Sodium 139 135 - 145 mmol/L   Potassium 3.5 3.5 - 5.1 mmol/L   Chloride 99 (L) 101 - 111 mmol/L   CO2 31 22 - 32 mmol/L   Glucose, Bld 113 (H) 65 - 99 mg/dL   BUN 13 6 - 20 mg/dL   Creatinine, Ser 0.84 0.61 - 1.24 mg/dL   Calcium 8.8 (L) 8.9 - 10.3 mg/dL   GFR calc non Af Amer >60 >60 mL/min   GFR calc Af Amer >60 >60 mL/min    Comment: (NOTE) The eGFR has been calculated using the CKD EPI equation. This calculation has not been validated in all clinical situations. eGFR's persistently <60 mL/min signify possible Chronic Kidney Disease.    Anion gap 9 5 - 15  CBC     Status: Abnormal   Collection Time: 06/21/15  4:47 AM  Result Value Ref Range   WBC 11.3 (H) 3.8 - 10.6 K/uL   RBC 4.52 4.40 - 5.90 MIL/uL   Hemoglobin 14.4 13.0 - 18.0 g/dL   HCT 41.0 40.0 - 52.0 %   MCV 90.7 80.0 - 100.0 fL   MCH 32.0 26.0 - 34.0 pg   MCHC 35.2 32.0 -  36.0 g/dL   RDW 13.5 11.5 - 14.5 %   Platelets 140 (L) 150 - 440 K/uL  Culture, expectorated sputum-assessment     Status: None   Collection Time: 06/22/15  8:98 AM  Result Value Ref Range   Specimen Description EXPECTORATED SPUTUM    Special Requests NONE    Sputum evaluation THIS SPECIMEN IS ACCEPTABLE FOR SPUTUM CULTURE    Report Status 06/23/2015 FINAL   Culture, respiratory (NON-Expectorated)     Status: None   Collection Time: 06/22/15  8:37 AM  Result Value Ref Range   Specimen Description EXPECTORATED SPUTUM    Special Requests NONE Reflexed from [id number]    Gram Stain      FEW WBC SEEN RARE GRAM NEGATIVE RODS MANY GRAM POSITIVE COCCI    Culture      HEAVY GROWTH HAEMOPHILUS PARAINFLUENZAE BETA LACTAMASE NEGATIVE LIGHT GROWTH ENTEROBACTER AEROGENES    Report Status 06/27/2015 FINAL    Organism ID, Bacteria ENTEROBACTER AEROGENES       Susceptibility   Enterobacter aerogenes - MIC*    CEFTAZIDIME Value in next row Sensitive      SENSITIVE<=1    CEFEPIME Value in next row Sensitive      SENSITIVE<=1    CEFTRIAXONE Value in next row Sensitive      SENSITIVE<=1    CIPROFLOXACIN Value in next row Sensitive      SENSITIVE<=0.25    GENTAMICIN Value in next row Sensitive      SENSITIVE<=1    IMIPENEM Value in next row Sensitive      SENSITIVE<=0.25    TRIMETH/SULFA Value in next row Sensitive      SENSITIVE<=20    * LIGHT GROWTH ENTEROBACTER AEROGENES  Acid Fast Smear+Culture W/Rflx ([facility] only)     Status: None   Collection Time: 06/22/15 11:24 AM  Result Value Ref Range   AFB Specimen Processing Concentration    Acid Fast Smear Negative    Acid Fast Culture Negative     Comment: (NOTE) No acid fast bacilli isolated after 6 weeks. Performed At: [facility] [place], [place] [id number] Lindon Romp MD ZN:[id number]   Fungus Culture with Smear     Status: None   Collection Time: 06/22/15 11:24 AM  Result Value Ref Range   Specimen  Description BRONCHIAL WASHINGS    Special Requests Normal    Culture CANDIDA GLABRATA    Report Status 07/13/2015 FINAL   Culture, respiratory (NON-Expectorated)     Status: None   Collection Time: 06/22/15 11:24 AM  Result Value Ref Range   Specimen Description BRONCHIAL WASHINGS    Special Requests Normal    Gram Stain      GOOD SPECIMEN - 80-90% WBCS FEW WBC SEEN MANY GRAM POSITIVE COCCI RARE GRAM NEGATIVE RODS    Culture      HEAVY GROWTH HAEMOPHILUS PARAINFLUENZAE HEAVY GROWTH HAEMOPHILUS INFLUENZAE BETA LACTAMASE NEGATIVE    Report Status 06/28/2015 FINAL   Hemoglobin A1c     Status: None   Collection Time: 06/23/15  5:04 AM  Result Value Ref Range   Hgb A1c MFr Bld 5.4 4.0 - 6.0 %  CBC     Status: Abnormal   Collection Time: 06/24/15  5:35 AM  Result Value Ref Range   WBC 6.7 3.8 - 10.6 K/uL   RBC 4.03 (L) 4.40 - 5.90 MIL/uL   Hemoglobin 12.6 (L) 13.0 - 18.0 g/dL   HCT 37.3 (L) 40.0 - 52.0 %   MCV 92.5 80.0 - 100.0 fL   MCH 31.2 26.0 - 34.0 pg   MCHC 33.8 32.0 - 36.0 g/dL   RDW 14.0 11.5 - 14.5 %   Platelets 160 150 -  440 K/uL  Comprehensive metabolic panel     Status: Abnormal   Collection Time: 07/29/15 10:08 AM  Result Value Ref Range   Sodium 138 135 - 145 mmol/L   Potassium 3.1 (L) 3.5 - 5.1 mmol/L   Chloride 99 (L) 101 - 111 mmol/L   CO2 33 (H) 22 - 32 mmol/L   Glucose, Bld 125 (H) 65 - 99 mg/dL   BUN 19 6 - 20 mg/dL   Creatinine, Ser 0.70 0.61 - 1.24 mg/dL   Calcium 9.0 8.9 - 10.3 mg/dL   Total Protein 6.9 6.5 - 8.1 g/dL   Albumin 3.4 (L) 3.5 - 5.0 g/dL   AST 20 15 - 41 U/L   ALT 18 17 - 63 U/L   Alkaline Phosphatase 83 38 - 126 U/L   Total Bilirubin 1.4 (H) 0.3 - 1.2 mg/dL   GFR calc non Af Amer >60 >60 mL/min   GFR calc Af Amer >60 >60 mL/min    Comment: (NOTE) The eGFR has been calculated using the CKD EPI equation. This calculation has not been validated in all clinical situations. eGFR's persistently <60 mL/min signify possible Chronic  Kidney Disease.    Anion gap 6 5 - 15  CBC with Differential     Status: Abnormal   Collection Time: 07/29/15 10:08 AM  Result Value Ref Range   WBC 10.1 3.8 - 10.6 K/uL   RBC 4.68 4.40 - 5.90 MIL/uL   Hemoglobin 14.4 13.0 - 18.0 g/dL   HCT 42.2 40.0 - 52.0 %   MCV 90.3 80.0 - 100.0 fL   MCH 30.9 26.0 - 34.0 pg   MCHC 34.2 32.0 - 36.0 g/dL   RDW 13.4 11.5 - 14.5 %   Platelets 192 150 - 440 K/uL   Neutrophils Relative % 85 %   Neutro Abs 8.5 (H) 1.4 - 6.5 K/uL   Lymphocytes Relative 6 %   Lymphs Abs 0.6 (L) 1.0 - 3.6 K/uL   Monocytes Relative 8 %   Monocytes Absolute 0.8 0.2 - 1.0 K/uL   Eosinophils Relative 0 %   Eosinophils Absolute 0.0 0 - 0.7 K/uL   Basophils Relative 1 %   Basophils Absolute 0.1 0 - 0.1 K/uL  Urinalysis complete, with microscopic ([facility] only)     Status: Abnormal   Collection Time: 07/29/15 10:08 AM  Result Value Ref Range   Color, Urine YELLOW (A) YELLOW   APPearance CLEAR (A) CLEAR   Glucose, UA NEGATIVE NEGATIVE mg/dL   Bilirubin Urine NEGATIVE NEGATIVE   Ketones, ur NEGATIVE NEGATIVE mg/dL   Specific Gravity, Urine 1.030 1.005 - 1.030   Hgb urine dipstick NEGATIVE NEGATIVE   pH 5.0 5.0 - 8.0   Protein, ur 30 (A) NEGATIVE mg/dL   Nitrite NEGATIVE NEGATIVE   Leukocytes, UA NEGATIVE NEGATIVE   RBC / HPF 0-5 0 - 5 RBC/hpf   WBC, UA 0-5 0 - 5 WBC/hpf   Bacteria, UA NONE SEEN NONE SEEN   Squamous Epithelial / LPF NONE SEEN NONE SEEN   Mucous PRESENT   Body fluid culture     Status: None   Collection Time: 07/31/15 11:39 AM  Result Value Ref Range   Specimen Description KNEE    Special Requests NONE    Gram Stain MANY WBC SEEN NO ORGANISMS SEEN     Culture NO GROWTH 4 DAYS    Report Status 08/04/2015 FINAL   Synovial cell count + diff, w/ crystals     Status: Abnormal  Collection Time: 07/31/15 11:39 AM  Result Value Ref Range   Color, Synovial RED (A) YELLOW   Appearance-Synovial CLOUDY (A) CLEAR   Crystals, Fluid CALCIUM PHOSPHATE  CRYSTALS    WBC, Synovial 25317 (H) 0 - 200 /cu mm   Neutrophil, Synovial 93 %   Lymphocytes-Synovial Fld 5 %   Monocyte-Macrophage-Synovial Fluid 2 %   Eosinophils-Synovial 0 %   Other Cells-SYN 0   Uric acid     Status: None   Collection Time: 07/31/15 11:39 AM  Result Value Ref Range   Uric Acid, Serum 4.9 4.4 - 7.6 mg/dL  CBC     Status: Abnormal   Collection Time: 07/31/15  8:55 PM  Result Value Ref Range   WBC 17.4 (H) 3.8 - 10.6 K/uL   RBC 4.38 (L) 4.40 - 5.90 MIL/uL   Hemoglobin 13.2 13.0 - 18.0 g/dL   HCT 39.7 (L) 40.0 - 52.0 %   MCV 90.6 80.0 - 100.0 fL   MCH 30.1 26.0 - 34.0 pg   MCHC 33.2 32.0 - 36.0 g/dL   RDW 14.0 11.5 - 14.5 %   Platelets 282 150 - 440 K/uL  Creatinine, serum     Status: None   Collection Time: 07/31/15  8:55 PM  Result Value Ref Range   Creatinine, Ser 0.68 0.61 - 1.24 mg/dL   GFR calc non Af Amer >60 >60 mL/min   GFR calc Af Amer >60 >60 mL/min    Comment: (NOTE) The eGFR has been calculated using the CKD EPI equation. This calculation has not been validated in all clinical situations. eGFR's persistently <60 mL/min signify possible Chronic Kidney Disease.   Basic metabolic panel     Status: Abnormal   Collection Time: 08/01/15  6:45 AM  Result Value Ref Range   Sodium 140 135 - 145 mmol/L   Potassium 4.4 3.5 - 5.1 mmol/L   Chloride 100 (L) 101 - 111 mmol/L   CO2 34 (H) 22 - 32 mmol/L   Glucose, Bld 128 (H) 65 - 99 mg/dL   BUN 22 (H) 6 - 20 mg/dL   Creatinine, Ser 0.56 (L) 0.61 - 1.24 mg/dL   Calcium 8.9 8.9 - 10.3 mg/dL   GFR calc non Af Amer >60 >60 mL/min   GFR calc Af Amer >60 >60 mL/min    Comment: (NOTE) The eGFR has been calculated using the CKD EPI equation. This calculation has not been validated in all clinical situations. eGFR's persistently <60 mL/min signify possible Chronic Kidney Disease.    Anion gap 6 5 - 15  CBC     Status: Abnormal   Collection Time: 08/01/15  6:45 AM  Result Value Ref Range   WBC 16.4 (H)  3.8 - 10.6 K/uL   RBC 4.30 (L) 4.40 - 5.90 MIL/uL   Hemoglobin 13.2 13.0 - 18.0 g/dL   HCT 38.8 (L) 40.0 - 52.0 %   MCV 90.4 80.0 - 100.0 fL   MCH 30.8 26.0 - 34.0 pg   MCHC 34.0 32.0 - 36.0 g/dL   RDW 13.7 11.5 - 14.5 %   Platelets 277 150 - 440 K/uL  C difficile quick scan w PCR reflex     Status: Abnormal   Collection Time: 08/02/15  8:35 AM  Result Value Ref Range   C Diff antigen POSITIVE (A) NEGATIVE   C Diff toxin POSITIVE (A) NEGATIVE   C Diff interpretation      Positive for toxigenic C. difficile, active toxin production present.  Comment: CRITICAL RESULT CALLED TO, READ BACK BY AND VERIFIED WITH: DENISE FREEMAN 08/02/15 AT 1000 VKB   CBC and differential     Status: None   Collection Time: 08/07/15 12:00 AM  Result Value Ref Range   Hemoglobin 13.6 13.5 - 17.5 g/dL   HCT 42 41 - 53 %   Platelets 387 150 - 399 K/L   WBC 15.7 20^1/EO  Basic metabolic panel     Status: Abnormal   Collection Time: 08/07/15 12:00 AM  Result Value Ref Range   Glucose 97 mg/dL   BUN 13 4 - 21 mg/dL   Creatinine 0.6 0.6 - 1.3 mg/dL   Potassium 4.1 3.4 - 5.3 mmol/L   Sodium 136 (A) 137 - 147 mmol/L  CBC and differential     Status: Abnormal   Collection Time: 08/13/15 12:00 AM  Result Value Ref Range   Hemoglobin 13.3 (A) 13.5 - 17.5 g/dL   HCT 40 (A) 41 - 53 %   Platelets 512 (A) 150 - 399 K/L   WBC 11.8 10^3/mL     Assessment & Plan 1. Arthritis  - Ambulatory referral to Rheumatology - predniSONE (DELTASONE) 10 MG tablet; TAke 1 tablet daily for 1 week then take 1/2 tablet daily  Dispense: 30 tablet; Refill: 2  2. Gastric out let obstruction   3. Essential (primary) hypertension  - Basic Metabolic Panel (BMET)  4. Pseudogout of knee, left   5. Malnutrition of moderate degree (HCC)   6. Aspiration pneumonia, unspecified aspiration pneumonia type  - DG Chest 2 View; Future

## 2015-08-31 DIAGNOSIS — K219 Gastro-esophageal reflux disease without esophagitis: Secondary | ICD-10-CM | POA: Diagnosis not present

## 2015-08-31 DIAGNOSIS — I10 Essential (primary) hypertension: Secondary | ICD-10-CM | POA: Diagnosis not present

## 2015-08-31 DIAGNOSIS — M549 Dorsalgia, unspecified: Secondary | ICD-10-CM | POA: Diagnosis not present

## 2015-08-31 DIAGNOSIS — M6281 Muscle weakness (generalized): Secondary | ICD-10-CM | POA: Diagnosis not present

## 2015-08-31 DIAGNOSIS — M1990 Unspecified osteoarthritis, unspecified site: Secondary | ICD-10-CM | POA: Diagnosis not present

## 2015-08-31 DIAGNOSIS — M11862 Other specified crystal arthropathies, left knee: Secondary | ICD-10-CM | POA: Diagnosis not present

## 2015-08-31 DIAGNOSIS — Z79891 Long term (current) use of opiate analgesic: Secondary | ICD-10-CM | POA: Diagnosis not present

## 2015-08-31 DIAGNOSIS — Z8719 Personal history of other diseases of the digestive system: Secondary | ICD-10-CM | POA: Diagnosis not present

## 2015-08-31 DIAGNOSIS — Z8619 Personal history of other infectious and parasitic diseases: Secondary | ICD-10-CM | POA: Diagnosis not present

## 2015-08-31 DIAGNOSIS — E785 Hyperlipidemia, unspecified: Secondary | ICD-10-CM | POA: Diagnosis not present

## 2015-08-31 DIAGNOSIS — G8929 Other chronic pain: Secondary | ICD-10-CM | POA: Diagnosis not present

## 2015-08-31 LAB — BASIC METABOLIC PANEL
BUN / CREAT RATIO: 23 — AB (ref 10–22)
BUN: 12 mg/dL (ref 8–27)
CALCIUM: 8.4 mg/dL — AB (ref 8.6–10.2)
CHLORIDE: 94 mmol/L — AB (ref 97–106)
CO2: 27 mmol/L (ref 18–29)
Creatinine, Ser: 0.52 mg/dL — ABNORMAL LOW (ref 0.76–1.27)
GFR calc Af Amer: 120 mL/min/{1.73_m2} (ref >59)
GFR calc non Af Amer: 104 mL/min/{1.73_m2} (ref >59)
GLUCOSE: 130 mg/dL — AB (ref 65–99)
POTASSIUM: 4.3 mmol/L (ref 3.5–5.2)
Sodium: 139 mmol/L (ref 136–144)

## 2015-09-03 ENCOUNTER — Telehealth: Payer: Self-pay | Admitting: Family Medicine

## 2015-09-03 DIAGNOSIS — M11862 Other specified crystal arthropathies, left knee: Secondary | ICD-10-CM | POA: Diagnosis not present

## 2015-09-03 DIAGNOSIS — Z79891 Long term (current) use of opiate analgesic: Secondary | ICD-10-CM | POA: Diagnosis not present

## 2015-09-03 DIAGNOSIS — M549 Dorsalgia, unspecified: Secondary | ICD-10-CM | POA: Diagnosis not present

## 2015-09-03 DIAGNOSIS — E785 Hyperlipidemia, unspecified: Secondary | ICD-10-CM | POA: Diagnosis not present

## 2015-09-03 DIAGNOSIS — M1990 Unspecified osteoarthritis, unspecified site: Secondary | ICD-10-CM | POA: Diagnosis not present

## 2015-09-03 DIAGNOSIS — M6281 Muscle weakness (generalized): Secondary | ICD-10-CM | POA: Diagnosis not present

## 2015-09-03 DIAGNOSIS — Z8719 Personal history of other diseases of the digestive system: Secondary | ICD-10-CM | POA: Diagnosis not present

## 2015-09-03 DIAGNOSIS — I10 Essential (primary) hypertension: Secondary | ICD-10-CM | POA: Diagnosis not present

## 2015-09-03 DIAGNOSIS — Z8619 Personal history of other infectious and parasitic diseases: Secondary | ICD-10-CM | POA: Diagnosis not present

## 2015-09-03 DIAGNOSIS — K219 Gastro-esophageal reflux disease without esophagitis: Secondary | ICD-10-CM | POA: Diagnosis not present

## 2015-09-03 DIAGNOSIS — G8929 Other chronic pain: Secondary | ICD-10-CM | POA: Diagnosis not present

## 2015-09-03 NOTE — Telephone Encounter (Signed)
Zachary Brewer and gave Dr. Luan Pulling verbal agreement with plan stated for OT bid x 4 weeks.

## 2015-09-03 NOTE — Telephone Encounter (Signed)
OK for OT twice a week for 4 weeks.-jhj

## 2015-09-03 NOTE — Telephone Encounter (Signed)
Zachary Brewer from [facility] is requesting a verbal for health occupational therapy twice a week for 4 weeks.  Please call [phone]

## 2015-09-04 DIAGNOSIS — G8929 Other chronic pain: Secondary | ICD-10-CM | POA: Diagnosis not present

## 2015-09-04 DIAGNOSIS — Z8719 Personal history of other diseases of the digestive system: Secondary | ICD-10-CM | POA: Diagnosis not present

## 2015-09-04 DIAGNOSIS — E785 Hyperlipidemia, unspecified: Secondary | ICD-10-CM | POA: Diagnosis not present

## 2015-09-04 DIAGNOSIS — M11862 Other specified crystal arthropathies, left knee: Secondary | ICD-10-CM | POA: Diagnosis not present

## 2015-09-04 DIAGNOSIS — Z79891 Long term (current) use of opiate analgesic: Secondary | ICD-10-CM | POA: Diagnosis not present

## 2015-09-04 DIAGNOSIS — M1990 Unspecified osteoarthritis, unspecified site: Secondary | ICD-10-CM | POA: Diagnosis not present

## 2015-09-04 DIAGNOSIS — M6281 Muscle weakness (generalized): Secondary | ICD-10-CM | POA: Diagnosis not present

## 2015-09-04 DIAGNOSIS — K219 Gastro-esophageal reflux disease without esophagitis: Secondary | ICD-10-CM | POA: Diagnosis not present

## 2015-09-04 DIAGNOSIS — M549 Dorsalgia, unspecified: Secondary | ICD-10-CM | POA: Diagnosis not present

## 2015-09-04 DIAGNOSIS — Z8619 Personal history of other infectious and parasitic diseases: Secondary | ICD-10-CM | POA: Diagnosis not present

## 2015-09-04 DIAGNOSIS — I10 Essential (primary) hypertension: Secondary | ICD-10-CM | POA: Diagnosis not present

## 2015-09-05 ENCOUNTER — Ambulatory Visit: Payer: Medicare Other | Admitting: Family Medicine

## 2015-09-05 DIAGNOSIS — M6281 Muscle weakness (generalized): Secondary | ICD-10-CM | POA: Diagnosis not present

## 2015-09-05 DIAGNOSIS — M549 Dorsalgia, unspecified: Secondary | ICD-10-CM | POA: Diagnosis not present

## 2015-09-05 DIAGNOSIS — M1990 Unspecified osteoarthritis, unspecified site: Secondary | ICD-10-CM | POA: Diagnosis not present

## 2015-09-05 DIAGNOSIS — E785 Hyperlipidemia, unspecified: Secondary | ICD-10-CM | POA: Diagnosis not present

## 2015-09-05 DIAGNOSIS — M11862 Other specified crystal arthropathies, left knee: Secondary | ICD-10-CM | POA: Diagnosis not present

## 2015-09-05 DIAGNOSIS — Z8719 Personal history of other diseases of the digestive system: Secondary | ICD-10-CM | POA: Diagnosis not present

## 2015-09-05 DIAGNOSIS — G8929 Other chronic pain: Secondary | ICD-10-CM | POA: Diagnosis not present

## 2015-09-05 DIAGNOSIS — Z79891 Long term (current) use of opiate analgesic: Secondary | ICD-10-CM | POA: Diagnosis not present

## 2015-09-05 DIAGNOSIS — Z8619 Personal history of other infectious and parasitic diseases: Secondary | ICD-10-CM | POA: Diagnosis not present

## 2015-09-05 DIAGNOSIS — K219 Gastro-esophageal reflux disease without esophagitis: Secondary | ICD-10-CM | POA: Diagnosis not present

## 2015-09-05 DIAGNOSIS — I10 Essential (primary) hypertension: Secondary | ICD-10-CM | POA: Diagnosis not present

## 2015-09-06 ENCOUNTER — Encounter: Payer: Self-pay | Admitting: Family Medicine

## 2015-09-06 ENCOUNTER — Ambulatory Visit (INDEPENDENT_AMBULATORY_CARE_PROVIDER_SITE_OTHER): Payer: Medicare Other | Admitting: Family Medicine

## 2015-09-06 VITALS — BP 103/70 | HR 102 | Temp 98.1°F | Resp 16 | Ht 67.0 in | Wt 128.0 lb

## 2015-09-06 DIAGNOSIS — M1990 Unspecified osteoarthritis, unspecified site: Secondary | ICD-10-CM

## 2015-09-06 DIAGNOSIS — K219 Gastro-esophageal reflux disease without esophagitis: Secondary | ICD-10-CM | POA: Diagnosis not present

## 2015-09-06 DIAGNOSIS — M549 Dorsalgia, unspecified: Secondary | ICD-10-CM | POA: Diagnosis not present

## 2015-09-06 DIAGNOSIS — I10 Essential (primary) hypertension: Secondary | ICD-10-CM

## 2015-09-06 DIAGNOSIS — K311 Adult hypertrophic pyloric stenosis: Secondary | ICD-10-CM | POA: Diagnosis not present

## 2015-09-06 DIAGNOSIS — G8929 Other chronic pain: Secondary | ICD-10-CM | POA: Diagnosis not present

## 2015-09-06 DIAGNOSIS — M6281 Muscle weakness (generalized): Secondary | ICD-10-CM | POA: Diagnosis not present

## 2015-09-06 DIAGNOSIS — E785 Hyperlipidemia, unspecified: Secondary | ICD-10-CM | POA: Diagnosis not present

## 2015-09-06 DIAGNOSIS — M11862 Other specified crystal arthropathies, left knee: Secondary | ICD-10-CM | POA: Diagnosis not present

## 2015-09-06 DIAGNOSIS — Z8619 Personal history of other infectious and parasitic diseases: Secondary | ICD-10-CM | POA: Diagnosis not present

## 2015-09-06 DIAGNOSIS — Z8719 Personal history of other diseases of the digestive system: Secondary | ICD-10-CM | POA: Diagnosis not present

## 2015-09-06 DIAGNOSIS — Z79891 Long term (current) use of opiate analgesic: Secondary | ICD-10-CM | POA: Diagnosis not present

## 2015-09-06 MED ORDER — PREDNISONE 10 MG PO TABS: ORAL_TABLET | ORAL | Status: DC

## 2015-09-06 NOTE — Progress Notes (Signed)
Name: Zachary Brewer   MRN: [id number]    DOB: 10-Nov-1940   Date:09/06/2015       Progress Note  Subjective  Chief Complaint  Chief Complaint  Patient presents with  . Gout    HPI Here for f/u of diffuse joint pain.  Taking several oxycodones a day.  Stioll on Colchicine (2 weeks).  Also still on 10 mg. Prednisone daily.  Dose has not been reduced to 1/2 tab yet.  Still c/o diffuse pain all day long.  Tylenol occ helps.  Has as much pain in upper shoulder/backl area as anywhere else.  Hips also bother a lot.  L knee is sl. Better.  He has appt. With [facility] (new physician) on Dec. 12.  He is eating  Better and exercisng more.  No problem-specific assessment & plan notes found for this encounter.   Past Medical History  Diagnosis Date  . Allergy   . GERD (gastroesophageal reflux disease)   . Hyperlipidemia   . Hypertension   . Arthritis 05/30/2015  . 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 Cervical pain 05/30/2015    Social History  Substance Use Topics  . Smoking status: Never Smoker   . Smokeless tobacco: Never Used  . Alcohol Use: No     Current outpatient prescriptions:  .  acetaminophen (TYLENOL) 325 MG tablet, Take 650 mg by mouth every 6 (six) hours as needed., Disp: , Rfl:  .  albuterol (PROVENTIL HFA;VENTOLIN HFA) 108 (90 BASE) MCG/ACT inhaler, Inhale 1-2 puffs into the lungs every 6 (six) hours as needed for wheezing or shortness of breath., Disp: 1 Inhaler, Rfl: 12 .  docusate sodium (COLACE) 100 MG capsule, Take 100 mg by mouth daily as needed for mild constipation., Disp: , Rfl:  .  furosemide (LASIX) 20 MG tablet, Take 1 tablet daily for 1 week, then 1 tablet every other day thereafter., Disp: 30 tablet, Rfl: 3 .  Multiple Vitamin (MULTIVITAMIN) tablet, Take 1 tablet by mouth daily., Disp: , Rfl:  .  omeprazole (PRILOSEC) 20 MG capsule, Take 1 capsule (20 mg total) by mouth 2 (two) times daily before a meal., Disp: 60 capsule, Rfl: 12 .  oxycodone (OXY-IR) 5 MG  capsule, Take 5 mg by mouth every 4 (four) hours as needed., Disp: , Rfl:  .  predniSONE (DELTASONE) 10 MG tablet, TAke 1 tablet daily for 1 week then take 1/2 tablet daily (Patient taking differently: Take 10 mg by mouth. Has not started taking the 1/2 pill yet though was supposed to start this week.), Disp: 30 tablet, Rfl: 2 .  tamsulosin (FLOMAX) 0.4 MG CAPS capsule, Take 1 capsule (0.4 mg total) by mouth daily after breakfast., Disp: 30 capsule, Rfl: 12  No Known Allergies  Review of Systems  Constitutional: Positive for weight loss and malaise/fatigue. Negative for fever and chills.  Respiratory: Negative for cough, sputum production, shortness of breath and wheezing.   Cardiovascular: Negative for chest pain, palpitations and leg swelling.  Gastrointestinal: Negative for heartburn, abdominal pain and blood in stool.       Having bowel movements every day  Genitourinary: Negative for dysuria, urgency and frequency.  Musculoskeletal: Positive for joint pain (diffuse).  Neurological: Positive for weakness.      Objective  Filed Vitals:   09/06/15 1048  BP: 103/70  Pulse: 102  Temp: 98.1 F (36.7 C)  Resp: 16  Height: 5' 7" (1.702 m)  Weight: 128 lb (58.06 kg)     Physical Exam  Constitutional:  No distress.  Thin cachetic  HENT:  Head: Normocephalic and atraumatic.  Neck: Normal range of motion. Neck supple. Carotid bruit is not present. No thyromegaly present.  Cardiovascular:  Occasional extrasystoles are present. Tachycardia present.  Exam reveals no gallop and no friction rub.   No murmur heard. Pulmonary/Chest: Effort normal and breath sounds normal. No respiratory distress. He has no wheezes. He has no rales.  Abdominal: Soft. Bowel sounds are normal. He exhibits no distension and no mass. There is no tenderness.  Lymphadenopathy:    He has no cervical adenopathy.  Vitals reviewed.     Recent Results (from the past 2160 hour(s))  CBC     Status: None    Collection Time: 06/20/15 11:30 AM  Result Value Ref Range   WBC 4.2 3.8 - 10.6 K/uL   RBC 5.01 4.40 - 5.90 MIL/uL   Hemoglobin 15.8 13.0 - 18.0 g/dL   HCT 45.7 40.0 - 52.0 %   MCV 91.1 80.0 - 100.0 fL   MCH 31.5 26.0 - 34.0 pg   MCHC 34.6 32.0 - 36.0 g/dL   RDW 13.5 11.5 - 14.5 %   Platelets 157 150 - 440 K/uL  Basic metabolic panel     Status: Abnormal   Collection Time: 06/20/15 11:30 AM  Result Value Ref Range   Sodium 141 135 - 145 mmol/L   Potassium 3.4 (L) 3.5 - 5.1 mmol/L   Chloride 96 (L) 101 - 111 mmol/L   CO2 34 (H) 22 - 32 mmol/L   Glucose, Bld 129 (H) 65 - 99 mg/dL   BUN 12 6 - 20 mg/dL   Creatinine, Ser 0.72 0.61 - 1.24 mg/dL   Calcium 9.0 8.9 - 10.3 mg/dL   GFR calc non Af Amer >60 >60 mL/min   GFR calc Af Amer >60 >60 mL/min    Comment: (NOTE) The eGFR has been calculated using the CKD EPI equation. This calculation has not been validated in all clinical situations. eGFR's persistently <60 mL/min signify possible Chronic Kidney Disease.    Anion gap 11 5 - 15  Basic metabolic panel     Status: Abnormal   Collection Time: 06/21/15  4:47 AM  Result Value Ref Range   Sodium 139 135 - 145 mmol/L   Potassium 3.5 3.5 - 5.1 mmol/L   Chloride 99 (L) 101 - 111 mmol/L   CO2 31 22 - 32 mmol/L   Glucose, Bld 113 (H) 65 - 99 mg/dL   BUN 13 6 - 20 mg/dL   Creatinine, Ser 0.84 0.61 - 1.24 mg/dL   Calcium 8.8 (L) 8.9 - 10.3 mg/dL   GFR calc non Af Amer >60 >60 mL/min   GFR calc Af Amer >60 >60 mL/min    Comment: (NOTE) The eGFR has been calculated using the CKD EPI equation. This calculation has not been validated in all clinical situations. eGFR's persistently <60 mL/min signify possible Chronic Kidney Disease.    Anion gap 9 5 - 15  CBC     Status: Abnormal   Collection Time: 06/21/15  4:47 AM  Result Value Ref Range   WBC 11.3 (H) 3.8 - 10.6 K/uL   RBC 4.52 4.40 - 5.90 MIL/uL   Hemoglobin 14.4 13.0 - 18.0 g/dL   HCT 41.0 40.0 - 52.0 %   MCV 90.7 80.0 -  100.0 fL   MCH 32.0 26.0 - 34.0 pg   MCHC 35.2 32.0 - 36.0 g/dL   RDW 13.5 11.5 - 14.5 %  Platelets 140 (L) 150 - 440 K/uL  Culture, expectorated sputum-assessment     Status: None   Collection Time: 06/22/15  8:37 AM  Result Value Ref Range   Specimen Description EXPECTORATED SPUTUM    Special Requests NONE    Sputum evaluation THIS SPECIMEN IS ACCEPTABLE FOR SPUTUM CULTURE    Report Status 06/23/2015 FINAL   Culture, respiratory (NON-Expectorated)     Status: None   Collection Time: 06/22/15  8:37 AM  Result Value Ref Range   Specimen Description EXPECTORATED SPUTUM    Special Requests NONE Reflexed from [id number]    Gram Stain      FEW WBC SEEN RARE GRAM NEGATIVE RODS MANY GRAM POSITIVE COCCI    Culture      HEAVY GROWTH HAEMOPHILUS PARAINFLUENZAE BETA LACTAMASE NEGATIVE LIGHT GROWTH ENTEROBACTER AEROGENES    Report Status 06/27/2015 FINAL    Organism ID, Bacteria ENTEROBACTER AEROGENES       Susceptibility   Enterobacter aerogenes - MIC*    CEFTAZIDIME Value in next row Sensitive      SENSITIVE<=1    CEFEPIME Value in next row Sensitive      SENSITIVE<=1    CEFTRIAXONE Value in next row Sensitive      SENSITIVE<=1    CIPROFLOXACIN Value in next row Sensitive      SENSITIVE<=0.25    GENTAMICIN Value in next row Sensitive      SENSITIVE<=1    IMIPENEM Value in next row Sensitive      SENSITIVE<=0.25    TRIMETH/SULFA Value in next row Sensitive      SENSITIVE<=20    * LIGHT GROWTH ENTEROBACTER AEROGENES  Acid Fast Smear+Culture W/Rflx ([facility] only)     Status: None   Collection Time: 06/22/15 11:24 AM  Result Value Ref Range   AFB Specimen Processing Concentration    Acid Fast Smear Negative    Acid Fast Culture Negative     Comment: (NOTE) No acid fast bacilli isolated after 6 weeks. Performed At: [facility] [place], [place] [id number] Lindon Romp MD BJ:[id number]   Fungus Culture with Smear     Status: None   Collection Time:  06/22/15 11:24 AM  Result Value Ref Range   Specimen Description BRONCHIAL WASHINGS    Special Requests Normal    Culture CANDIDA GLABRATA    Report Status 07/13/2015 FINAL   Culture, respiratory (NON-Expectorated)     Status: None   Collection Time: 06/22/15 11:24 AM  Result Value Ref Range   Specimen Description BRONCHIAL WASHINGS    Special Requests Normal    Gram Stain      GOOD SPECIMEN - 80-90% WBCS FEW WBC SEEN MANY GRAM POSITIVE COCCI RARE GRAM NEGATIVE RODS    Culture      HEAVY GROWTH HAEMOPHILUS PARAINFLUENZAE HEAVY GROWTH HAEMOPHILUS INFLUENZAE BETA LACTAMASE NEGATIVE    Report Status 06/28/2015 FINAL   Hemoglobin A1c     Status: None   Collection Time: 06/23/15  5:04 AM  Result Value Ref Range   Hgb A1c MFr Bld 5.4 4.0 - 6.0 %  CBC     Status: Abnormal   Collection Time: 06/24/15  5:35 AM  Result Value Ref Range   WBC 6.7 3.8 - 10.6 K/uL   RBC 4.03 (L) 4.40 - 5.90 MIL/uL   Hemoglobin 12.6 (L) 13.0 - 18.0 g/dL   HCT 37.3 (L) 40.0 - 52.0 %   MCV 92.5 80.0 - 100.0 fL   MCH 31.2 26.0 - 34.0  pg   MCHC 33.8 32.0 - 36.0 g/dL   RDW 14.0 11.5 - 14.5 %   Platelets 160 150 - 440 K/uL  Comprehensive metabolic panel     Status: Abnormal   Collection Time: 07/29/15 10:08 AM  Result Value Ref Range   Sodium 138 135 - 145 mmol/L   Potassium 3.1 (L) 3.5 - 5.1 mmol/L   Chloride 99 (L) 101 - 111 mmol/L   CO2 33 (H) 22 - 32 mmol/L   Glucose, Bld 125 (H) 65 - 99 mg/dL   BUN 19 6 - 20 mg/dL   Creatinine, Ser 0.70 0.61 - 1.24 mg/dL   Calcium 9.0 8.9 - 10.3 mg/dL   Total Protein 6.9 6.5 - 8.1 g/dL   Albumin 3.4 (L) 3.5 - 5.0 g/dL   AST 20 15 - 41 U/L   ALT 18 17 - 63 U/L   Alkaline Phosphatase 83 38 - 126 U/L   Total Bilirubin 1.4 (H) 0.3 - 1.2 mg/dL   GFR calc non Af Amer >60 >60 mL/min   GFR calc Af Amer >60 >60 mL/min    Comment: (NOTE) The eGFR has been calculated using the CKD EPI equation. This calculation has not been validated in all clinical  situations. eGFR's persistently <60 mL/min signify possible Chronic Kidney Disease.    Anion gap 6 5 - 15  CBC with Differential     Status: Abnormal   Collection Time: 07/29/15 10:08 AM  Result Value Ref Range   WBC 10.1 3.8 - 10.6 K/uL   RBC 4.68 4.40 - 5.90 MIL/uL   Hemoglobin 14.4 13.0 - 18.0 g/dL   HCT 42.2 40.0 - 52.0 %   MCV 90.3 80.0 - 100.0 fL   MCH 30.9 26.0 - 34.0 pg   MCHC 34.2 32.0 - 36.0 g/dL   RDW 13.4 11.5 - 14.5 %   Platelets 192 150 - 440 K/uL   Neutrophils Relative % 85 %   Neutro Abs 8.5 (H) 1.4 - 6.5 K/uL   Lymphocytes Relative 6 %   Lymphs Abs 0.6 (L) 1.0 - 3.6 K/uL   Monocytes Relative 8 %   Monocytes Absolute 0.8 0.2 - 1.0 K/uL   Eosinophils Relative 0 %   Eosinophils Absolute 0.0 0 - 0.7 K/uL   Basophils Relative 1 %   Basophils Absolute 0.1 0 - 0.1 K/uL  Urinalysis complete, with microscopic ([facility] only)     Status: Abnormal   Collection Time: 07/29/15 10:08 AM  Result Value Ref Range   Color, Urine YELLOW (A) YELLOW   APPearance CLEAR (A) CLEAR   Glucose, UA NEGATIVE NEGATIVE mg/dL   Bilirubin Urine NEGATIVE NEGATIVE   Ketones, ur NEGATIVE NEGATIVE mg/dL   Specific Gravity, Urine 1.030 1.005 - 1.030   Hgb urine dipstick NEGATIVE NEGATIVE   pH 5.0 5.0 - 8.0   Protein, ur 30 (A) NEGATIVE mg/dL   Nitrite NEGATIVE NEGATIVE   Leukocytes, UA NEGATIVE NEGATIVE   RBC / HPF 0-5 0 - 5 RBC/hpf   WBC, UA 0-5 0 - 5 WBC/hpf   Bacteria, UA NONE SEEN NONE SEEN   Squamous Epithelial / LPF NONE SEEN NONE SEEN   Mucous PRESENT   Body fluid culture     Status: None   Collection Time: 07/31/15 11:39 AM  Result Value Ref Range   Specimen Description KNEE    Special Requests NONE    Gram Stain MANY WBC SEEN NO ORGANISMS SEEN     Culture NO GROWTH 4 DAYS  Report Status 08/04/2015 FINAL   Synovial cell count + diff, w/ crystals     Status: Abnormal   Collection Time: 07/31/15 11:39 AM  Result Value Ref Range   Color, Synovial RED (A) YELLOW    Appearance-Synovial CLOUDY (A) CLEAR   Crystals, Fluid CALCIUM PHOSPHATE CRYSTALS    WBC, Synovial 25317 (H) 0 - 200 /cu mm   Neutrophil, Synovial 93 %   Lymphocytes-Synovial Fld 5 %   Monocyte-Macrophage-Synovial Fluid 2 %   Eosinophils-Synovial 0 %   Other Cells-SYN 0   Uric acid     Status: None   Collection Time: 07/31/15 11:39 AM  Result Value Ref Range   Uric Acid, Serum 4.9 4.4 - 7.6 mg/dL  CBC     Status: Abnormal   Collection Time: 07/31/15  8:55 PM  Result Value Ref Range   WBC 17.4 (H) 3.8 - 10.6 K/uL   RBC 4.38 (L) 4.40 - 5.90 MIL/uL   Hemoglobin 13.2 13.0 - 18.0 g/dL   HCT 39.7 (L) 40.0 - 52.0 %   MCV 90.6 80.0 - 100.0 fL   MCH 30.1 26.0 - 34.0 pg   MCHC 33.2 32.0 - 36.0 g/dL   RDW 14.0 11.5 - 14.5 %   Platelets 282 150 - 440 K/uL  Creatinine, serum     Status: None   Collection Time: 07/31/15  8:55 PM  Result Value Ref Range   Creatinine, Ser 0.68 0.61 - 1.24 mg/dL   GFR calc non Af Amer >60 >60 mL/min   GFR calc Af Amer >60 >60 mL/min    Comment: (NOTE) The eGFR has been calculated using the CKD EPI equation. This calculation has not been validated in all clinical situations. eGFR's persistently <60 mL/min signify possible Chronic Kidney Disease.   Basic metabolic panel     Status: Abnormal   Collection Time: 08/01/15  6:45 AM  Result Value Ref Range   Sodium 140 135 - 145 mmol/L   Potassium 4.4 3.5 - 5.1 mmol/L   Chloride 100 (L) 101 - 111 mmol/L   CO2 34 (H) 22 - 32 mmol/L   Glucose, Bld 128 (H) 65 - 99 mg/dL   BUN 22 (H) 6 - 20 mg/dL   Creatinine, Ser 0.56 (L) 0.61 - 1.24 mg/dL   Calcium 8.9 8.9 - 10.3 mg/dL   GFR calc non Af Amer >60 >60 mL/min   GFR calc Af Amer >60 >60 mL/min    Comment: (NOTE) The eGFR has been calculated using the CKD EPI equation. This calculation has not been validated in all clinical situations. eGFR's persistently <60 mL/min signify possible Chronic Kidney Disease.    Anion gap 6 5 - 15  CBC     Status: Abnormal    Collection Time: 08/01/15  6:45 AM  Result Value Ref Range   WBC 16.4 (H) 3.8 - 10.6 K/uL   RBC 4.30 (L) 4.40 - 5.90 MIL/uL   Hemoglobin 13.2 13.0 - 18.0 g/dL   HCT 38.8 (L) 40.0 - 52.0 %   MCV 90.4 80.0 - 100.0 fL   MCH 30.8 26.0 - 34.0 pg   MCHC 34.0 32.0 - 36.0 g/dL   RDW 13.7 11.5 - 14.5 %   Platelets 277 150 - 440 K/uL  C difficile quick scan w PCR reflex     Status: Abnormal   Collection Time: 08/02/15  8:35 AM  Result Value Ref Range   C Diff antigen POSITIVE (A) NEGATIVE   C Diff toxin POSITIVE (A)  NEGATIVE   C Diff interpretation      Positive for toxigenic C. difficile, active toxin production present.    Comment: CRITICAL RESULT CALLED TO, READ BACK BY AND VERIFIED WITH: DENISE [facility] 08/02/15 AT 1000 VKB   CBC and differential     Status: None   Collection Time: 08/07/15 12:00 AM  Result Value Ref Range   Hemoglobin 13.6 13.5 - 17.5 g/dL   HCT 42 41 - 53 %   Platelets 387 150 - 399 K/L   WBC 15.7 41^3/KG  Basic metabolic panel     Status: Abnormal   Collection Time: 08/07/15 12:00 AM  Result Value Ref Range   Glucose 97 mg/dL   BUN 13 4 - 21 mg/dL   Creatinine 0.6 0.6 - 1.3 mg/dL   Potassium 4.1 3.4 - 5.3 mmol/L   Sodium 136 (A) 137 - 147 mmol/L  CBC and differential     Status: Abnormal   Collection Time: 08/13/15 12:00 AM  Result Value Ref Range   Hemoglobin 13.3 (A) 13.5 - 17.5 g/dL   HCT 40 (A) 41 - 53 %   Platelets 512 (A) 150 - 399 K/L   WBC 11.8 40^1/UU  Basic Metabolic Panel (BMET)     Status: Abnormal   Collection Time: 08/30/15 11:39 AM  Result Value Ref Range   Glucose 130 (H) 65 - 99 mg/dL   BUN 12 8 - 27 mg/dL   Creatinine, Ser 0.52 (L) 0.76 - 1.27 mg/dL   GFR calc non Af Amer 104 >59 mL/min/1.73   GFR calc Af Amer 120 >59 mL/min/1.73   BUN/Creatinine Ratio 23 (H) 10 - 22   Sodium 139 136 - 144 mmol/L   Potassium 4.3 3.5 - 5.2 mmol/L   Chloride 94 (L) 97 - 106 mmol/L   CO2 27 18 - 29 mmol/L   Calcium 8.4 (L) 8.6 - 10.2 mg/dL      Assessment & Plan 1. Arthritis -try to limit opoids some (to 2-3 times a day. - predniSONE (DELTASONE) 10 MG tablet; TAke 1/2 tablet daily  Dispense: 30 tablet; Refill: 2  2. Essential (primary) hypertension   3. Gastric out let obstruction

## 2015-09-06 NOTE — Patient Instructions (Signed)
Continue to try toe increase calories in diet.  Continue PT to regain  strength

## 2015-09-11 DIAGNOSIS — Z8719 Personal history of other diseases of the digestive system: Secondary | ICD-10-CM | POA: Diagnosis not present

## 2015-09-11 DIAGNOSIS — Z79891 Long term (current) use of opiate analgesic: Secondary | ICD-10-CM | POA: Diagnosis not present

## 2015-09-11 DIAGNOSIS — Z8619 Personal history of other infectious and parasitic diseases: Secondary | ICD-10-CM | POA: Diagnosis not present

## 2015-09-11 DIAGNOSIS — M11862 Other specified crystal arthropathies, left knee: Secondary | ICD-10-CM | POA: Diagnosis not present

## 2015-09-11 DIAGNOSIS — G8929 Other chronic pain: Secondary | ICD-10-CM | POA: Diagnosis not present

## 2015-09-11 DIAGNOSIS — E785 Hyperlipidemia, unspecified: Secondary | ICD-10-CM | POA: Diagnosis not present

## 2015-09-11 DIAGNOSIS — M549 Dorsalgia, unspecified: Secondary | ICD-10-CM | POA: Diagnosis not present

## 2015-09-11 DIAGNOSIS — I10 Essential (primary) hypertension: Secondary | ICD-10-CM | POA: Diagnosis not present

## 2015-09-11 DIAGNOSIS — M6281 Muscle weakness (generalized): Secondary | ICD-10-CM | POA: Diagnosis not present

## 2015-09-11 DIAGNOSIS — K219 Gastro-esophageal reflux disease without esophagitis: Secondary | ICD-10-CM | POA: Diagnosis not present

## 2015-09-11 DIAGNOSIS — M1990 Unspecified osteoarthritis, unspecified site: Secondary | ICD-10-CM | POA: Diagnosis not present

## 2015-09-12 DIAGNOSIS — K219 Gastro-esophageal reflux disease without esophagitis: Secondary | ICD-10-CM | POA: Diagnosis not present

## 2015-09-12 DIAGNOSIS — M6281 Muscle weakness (generalized): Secondary | ICD-10-CM | POA: Diagnosis not present

## 2015-09-12 DIAGNOSIS — M549 Dorsalgia, unspecified: Secondary | ICD-10-CM | POA: Diagnosis not present

## 2015-09-12 DIAGNOSIS — M11862 Other specified crystal arthropathies, left knee: Secondary | ICD-10-CM | POA: Diagnosis not present

## 2015-09-12 DIAGNOSIS — G8929 Other chronic pain: Secondary | ICD-10-CM | POA: Diagnosis not present

## 2015-09-12 DIAGNOSIS — Z79891 Long term (current) use of opiate analgesic: Secondary | ICD-10-CM | POA: Diagnosis not present

## 2015-09-12 DIAGNOSIS — Z8719 Personal history of other diseases of the digestive system: Secondary | ICD-10-CM | POA: Diagnosis not present

## 2015-09-12 DIAGNOSIS — E785 Hyperlipidemia, unspecified: Secondary | ICD-10-CM | POA: Diagnosis not present

## 2015-09-12 DIAGNOSIS — Z8619 Personal history of other infectious and parasitic diseases: Secondary | ICD-10-CM | POA: Diagnosis not present

## 2015-09-12 DIAGNOSIS — I10 Essential (primary) hypertension: Secondary | ICD-10-CM | POA: Diagnosis not present

## 2015-09-12 DIAGNOSIS — M1990 Unspecified osteoarthritis, unspecified site: Secondary | ICD-10-CM | POA: Diagnosis not present

## 2015-09-13 DIAGNOSIS — M6281 Muscle weakness (generalized): Secondary | ICD-10-CM | POA: Diagnosis not present

## 2015-09-13 DIAGNOSIS — M11862 Other specified crystal arthropathies, left knee: Secondary | ICD-10-CM | POA: Diagnosis not present

## 2015-09-13 DIAGNOSIS — I10 Essential (primary) hypertension: Secondary | ICD-10-CM | POA: Diagnosis not present

## 2015-09-13 DIAGNOSIS — M1990 Unspecified osteoarthritis, unspecified site: Secondary | ICD-10-CM | POA: Diagnosis not present

## 2015-09-13 DIAGNOSIS — K219 Gastro-esophageal reflux disease without esophagitis: Secondary | ICD-10-CM | POA: Diagnosis not present

## 2015-09-13 DIAGNOSIS — G8929 Other chronic pain: Secondary | ICD-10-CM | POA: Diagnosis not present

## 2015-09-13 DIAGNOSIS — Z79891 Long term (current) use of opiate analgesic: Secondary | ICD-10-CM | POA: Diagnosis not present

## 2015-09-13 DIAGNOSIS — E785 Hyperlipidemia, unspecified: Secondary | ICD-10-CM | POA: Diagnosis not present

## 2015-09-13 DIAGNOSIS — Z8619 Personal history of other infectious and parasitic diseases: Secondary | ICD-10-CM | POA: Diagnosis not present

## 2015-09-13 DIAGNOSIS — M549 Dorsalgia, unspecified: Secondary | ICD-10-CM | POA: Diagnosis not present

## 2015-09-13 DIAGNOSIS — Z8719 Personal history of other diseases of the digestive system: Secondary | ICD-10-CM | POA: Diagnosis not present

## 2015-09-14 DIAGNOSIS — Z79891 Long term (current) use of opiate analgesic: Secondary | ICD-10-CM | POA: Diagnosis not present

## 2015-09-14 DIAGNOSIS — M6281 Muscle weakness (generalized): Secondary | ICD-10-CM | POA: Diagnosis not present

## 2015-09-14 DIAGNOSIS — Z8619 Personal history of other infectious and parasitic diseases: Secondary | ICD-10-CM | POA: Diagnosis not present

## 2015-09-14 DIAGNOSIS — K219 Gastro-esophageal reflux disease without esophagitis: Secondary | ICD-10-CM | POA: Diagnosis not present

## 2015-09-14 DIAGNOSIS — M549 Dorsalgia, unspecified: Secondary | ICD-10-CM | POA: Diagnosis not present

## 2015-09-14 DIAGNOSIS — M1990 Unspecified osteoarthritis, unspecified site: Secondary | ICD-10-CM | POA: Diagnosis not present

## 2015-09-14 DIAGNOSIS — I10 Essential (primary) hypertension: Secondary | ICD-10-CM | POA: Diagnosis not present

## 2015-09-14 DIAGNOSIS — Z8719 Personal history of other diseases of the digestive system: Secondary | ICD-10-CM | POA: Diagnosis not present

## 2015-09-14 DIAGNOSIS — M11862 Other specified crystal arthropathies, left knee: Secondary | ICD-10-CM | POA: Diagnosis not present

## 2015-09-14 DIAGNOSIS — G8929 Other chronic pain: Secondary | ICD-10-CM | POA: Diagnosis not present

## 2015-09-14 DIAGNOSIS — E785 Hyperlipidemia, unspecified: Secondary | ICD-10-CM | POA: Diagnosis not present

## 2015-09-16 DIAGNOSIS — M1990 Unspecified osteoarthritis, unspecified site: Secondary | ICD-10-CM | POA: Diagnosis not present

## 2015-09-16 DIAGNOSIS — I10 Essential (primary) hypertension: Secondary | ICD-10-CM | POA: Diagnosis not present

## 2015-09-16 DIAGNOSIS — Z79891 Long term (current) use of opiate analgesic: Secondary | ICD-10-CM | POA: Diagnosis not present

## 2015-09-16 DIAGNOSIS — Z8719 Personal history of other diseases of the digestive system: Secondary | ICD-10-CM | POA: Diagnosis not present

## 2015-09-16 DIAGNOSIS — K219 Gastro-esophageal reflux disease without esophagitis: Secondary | ICD-10-CM | POA: Diagnosis not present

## 2015-09-16 DIAGNOSIS — G8929 Other chronic pain: Secondary | ICD-10-CM | POA: Diagnosis not present

## 2015-09-16 DIAGNOSIS — M6281 Muscle weakness (generalized): Secondary | ICD-10-CM | POA: Diagnosis not present

## 2015-09-16 DIAGNOSIS — M549 Dorsalgia, unspecified: Secondary | ICD-10-CM | POA: Diagnosis not present

## 2015-09-16 DIAGNOSIS — Z8619 Personal history of other infectious and parasitic diseases: Secondary | ICD-10-CM | POA: Diagnosis not present

## 2015-09-16 DIAGNOSIS — M11862 Other specified crystal arthropathies, left knee: Secondary | ICD-10-CM | POA: Diagnosis not present

## 2015-09-16 DIAGNOSIS — E785 Hyperlipidemia, unspecified: Secondary | ICD-10-CM | POA: Diagnosis not present

## 2015-09-18 DIAGNOSIS — Z79891 Long term (current) use of opiate analgesic: Secondary | ICD-10-CM | POA: Diagnosis not present

## 2015-09-18 DIAGNOSIS — M11862 Other specified crystal arthropathies, left knee: Secondary | ICD-10-CM | POA: Diagnosis not present

## 2015-09-18 DIAGNOSIS — Z8619 Personal history of other infectious and parasitic diseases: Secondary | ICD-10-CM | POA: Diagnosis not present

## 2015-09-18 DIAGNOSIS — G8929 Other chronic pain: Secondary | ICD-10-CM | POA: Diagnosis not present

## 2015-09-18 DIAGNOSIS — I10 Essential (primary) hypertension: Secondary | ICD-10-CM | POA: Diagnosis not present

## 2015-09-18 DIAGNOSIS — M549 Dorsalgia, unspecified: Secondary | ICD-10-CM | POA: Diagnosis not present

## 2015-09-18 DIAGNOSIS — M6281 Muscle weakness (generalized): Secondary | ICD-10-CM | POA: Diagnosis not present

## 2015-09-18 DIAGNOSIS — Z8719 Personal history of other diseases of the digestive system: Secondary | ICD-10-CM | POA: Diagnosis not present

## 2015-09-18 DIAGNOSIS — E785 Hyperlipidemia, unspecified: Secondary | ICD-10-CM | POA: Diagnosis not present

## 2015-09-18 DIAGNOSIS — M1990 Unspecified osteoarthritis, unspecified site: Secondary | ICD-10-CM | POA: Diagnosis not present

## 2015-09-18 DIAGNOSIS — K219 Gastro-esophageal reflux disease without esophagitis: Secondary | ICD-10-CM | POA: Diagnosis not present

## 2015-09-19 DIAGNOSIS — Z8619 Personal history of other infectious and parasitic diseases: Secondary | ICD-10-CM | POA: Diagnosis not present

## 2015-09-19 DIAGNOSIS — K219 Gastro-esophageal reflux disease without esophagitis: Secondary | ICD-10-CM | POA: Diagnosis not present

## 2015-09-19 DIAGNOSIS — Z8719 Personal history of other diseases of the digestive system: Secondary | ICD-10-CM | POA: Diagnosis not present

## 2015-09-19 DIAGNOSIS — E785 Hyperlipidemia, unspecified: Secondary | ICD-10-CM | POA: Diagnosis not present

## 2015-09-19 DIAGNOSIS — I10 Essential (primary) hypertension: Secondary | ICD-10-CM | POA: Diagnosis not present

## 2015-09-19 DIAGNOSIS — M11862 Other specified crystal arthropathies, left knee: Secondary | ICD-10-CM | POA: Diagnosis not present

## 2015-09-19 DIAGNOSIS — M549 Dorsalgia, unspecified: Secondary | ICD-10-CM | POA: Diagnosis not present

## 2015-09-19 DIAGNOSIS — M6281 Muscle weakness (generalized): Secondary | ICD-10-CM | POA: Diagnosis not present

## 2015-09-19 DIAGNOSIS — G8929 Other chronic pain: Secondary | ICD-10-CM | POA: Diagnosis not present

## 2015-09-19 DIAGNOSIS — M1990 Unspecified osteoarthritis, unspecified site: Secondary | ICD-10-CM | POA: Diagnosis not present

## 2015-09-19 DIAGNOSIS — Z79891 Long term (current) use of opiate analgesic: Secondary | ICD-10-CM | POA: Diagnosis not present

## 2015-09-21 DIAGNOSIS — M549 Dorsalgia, unspecified: Secondary | ICD-10-CM | POA: Diagnosis not present

## 2015-09-21 DIAGNOSIS — K219 Gastro-esophageal reflux disease without esophagitis: Secondary | ICD-10-CM | POA: Diagnosis not present

## 2015-09-21 DIAGNOSIS — M11862 Other specified crystal arthropathies, left knee: Secondary | ICD-10-CM | POA: Diagnosis not present

## 2015-09-21 DIAGNOSIS — M1990 Unspecified osteoarthritis, unspecified site: Secondary | ICD-10-CM | POA: Diagnosis not present

## 2015-09-21 DIAGNOSIS — G8929 Other chronic pain: Secondary | ICD-10-CM | POA: Diagnosis not present

## 2015-09-21 DIAGNOSIS — Z79891 Long term (current) use of opiate analgesic: Secondary | ICD-10-CM | POA: Diagnosis not present

## 2015-09-21 DIAGNOSIS — I10 Essential (primary) hypertension: Secondary | ICD-10-CM | POA: Diagnosis not present

## 2015-09-21 DIAGNOSIS — M6281 Muscle weakness (generalized): Secondary | ICD-10-CM | POA: Diagnosis not present

## 2015-09-21 DIAGNOSIS — Z8619 Personal history of other infectious and parasitic diseases: Secondary | ICD-10-CM | POA: Diagnosis not present

## 2015-09-21 DIAGNOSIS — E785 Hyperlipidemia, unspecified: Secondary | ICD-10-CM | POA: Diagnosis not present

## 2015-09-21 DIAGNOSIS — Z8719 Personal history of other diseases of the digestive system: Secondary | ICD-10-CM | POA: Diagnosis not present

## 2015-09-24 ENCOUNTER — Ambulatory Visit (INDEPENDENT_AMBULATORY_CARE_PROVIDER_SITE_OTHER): Payer: Medicare Other | Admitting: Family Medicine

## 2015-09-24 ENCOUNTER — Encounter: Payer: Self-pay | Admitting: Family Medicine

## 2015-09-24 VITALS — BP 132/83 | HR 105 | Resp 16 | Wt 127.4 lb

## 2015-09-24 DIAGNOSIS — M1990 Unspecified osteoarthritis, unspecified site: Secondary | ICD-10-CM | POA: Diagnosis not present

## 2015-09-24 DIAGNOSIS — Z8619 Personal history of other infectious and parasitic diseases: Secondary | ICD-10-CM | POA: Diagnosis not present

## 2015-09-24 DIAGNOSIS — K311 Adult hypertrophic pyloric stenosis: Secondary | ICD-10-CM

## 2015-09-24 DIAGNOSIS — G894 Chronic pain syndrome: Secondary | ICD-10-CM

## 2015-09-24 DIAGNOSIS — G8929 Other chronic pain: Secondary | ICD-10-CM | POA: Diagnosis not present

## 2015-09-24 DIAGNOSIS — M11862 Other specified crystal arthropathies, left knee: Secondary | ICD-10-CM | POA: Diagnosis not present

## 2015-09-24 DIAGNOSIS — M549 Dorsalgia, unspecified: Secondary | ICD-10-CM | POA: Diagnosis not present

## 2015-09-24 DIAGNOSIS — Z8719 Personal history of other diseases of the digestive system: Secondary | ICD-10-CM | POA: Diagnosis not present

## 2015-09-24 DIAGNOSIS — M6281 Muscle weakness (generalized): Secondary | ICD-10-CM | POA: Diagnosis not present

## 2015-09-24 DIAGNOSIS — K219 Gastro-esophageal reflux disease without esophagitis: Secondary | ICD-10-CM | POA: Diagnosis not present

## 2015-09-24 DIAGNOSIS — Z79891 Long term (current) use of opiate analgesic: Secondary | ICD-10-CM | POA: Diagnosis not present

## 2015-09-24 DIAGNOSIS — I10 Essential (primary) hypertension: Secondary | ICD-10-CM | POA: Diagnosis not present

## 2015-09-24 DIAGNOSIS — E785 Hyperlipidemia, unspecified: Secondary | ICD-10-CM | POA: Diagnosis not present

## 2015-09-24 MED ORDER — OXYCODONE HCL 5 MG PO CAPS: ORAL_CAPSULE | ORAL | Status: DC

## 2015-09-24 MED ORDER — PREDNISONE 20 MG PO TABS: 20.0000 mg | ORAL_TABLET | Freq: Every day | ORAL | Status: DC

## 2015-09-24 NOTE — Progress Notes (Signed)
Name: Zachary Brewer   MRN: [id number]    DOB: 1941-10-13   Date:09/24/2015       Progress Note  Subjective  Chief Complaint  Chief Complaint  Patient presents with  . Arthritis    Eating Tylenol every hour at times. Has severe pain.     Arthritis Associated symptoms include weight loss. Pertinent negatives include no dysuria or fever.   Here for f/u of arthritis and chronic pain.  Back on 20 mg prednisone daily.  Pain and  decreaed mobility with lower dose.  Also not doing well on anything  Lower in narcotic dose.  He has appt. With Rheumatology in 2 weeks. Weight still slowly decreasing.   No problem-specific assessment & plan notes found for this encounter.   Past Medical History  Diagnosis Date  . Allergy   . GERD (gastroesophageal reflux disease)   . Hyperlipidemia   . Hypertension   . Arthritis 05/30/2015  . 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 Cervical pain 05/30/2015    Past Surgical History  Procedure Laterality Date  . Gastric outlet obstruction release  2013  . 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 Upper gi endoscopy    . Esophagogastroduodenoscopy (egd) with propofol N/A 06/20/2015    Procedure: ESOPHAGOGASTRODUODENOSCOPY (EGD) WITH PROPOFOL with dialation;  Surgeon: Lucilla Lame, MD;  Location: [facility];  Service: Endoscopy;  Laterality: N/A;  . Flexible bronchoscopy N/A 06/22/2015    Procedure: FLEXIBLE BRONCHOSCOPY;  Surgeon: Allyne Gee, MD;  Location: [facility];  Service: Pulmonary;  Laterality: N/A;    Family History  Problem Relation Age of Onset  . Diabetes Mother   . Heart disease Father   . Diabetes Sister   . Diabetes Brother   . Stroke Brother   . Mental illness Brother   . Hypertension Paternal Grandfather     Social History   Social History  . Marital Status: Widowed    Spouse Name: N/A  . Number of Children: N/A  . Years of Education: N/A   Occupational History  . Not on file.   Social History Main Topics  . Smoking status: Never Smoker    . Smokeless tobacco: Never Used  . Alcohol Use: No  . Drug Use: No  . Sexual Activity: Not on file   Other Topics Concern  . Not on file   Social History Narrative     Current outpatient prescriptions:  .  acetaminophen (TYLENOL) 325 MG tablet, Take 650 mg by mouth every 6 (six) hours as needed., Disp: , Rfl:  .  docusate sodium (COLACE) 100 MG capsule, Take 100 mg by mouth daily as needed for mild constipation., Disp: , Rfl:  .  furosemide (LASIX) 20 MG tablet, Take 1 tablet daily for 1 week, then 1 tablet every other day thereafter., Disp: 30 tablet, Rfl: 3 .  omeprazole (PRILOSEC) 20 MG capsule, Take 1 capsule (20 mg total) by mouth 2 (two) times daily before a meal., Disp: 60 capsule, Rfl: 12 .  oxycodone (OXY-IR) 5 MG capsule, Take 1 tablet every 6 hours as needed for pain, Disp: 120 capsule, Rfl: 0 .  tamsulosin (FLOMAX) 0.4 MG CAPS capsule, Take 1 capsule (0.4 mg total) by mouth daily after breakfast., Disp: 30 capsule, Rfl: 12 .  predniSONE (DELTASONE) 20 MG tablet, Take 1 tablet (20 mg total) by mouth daily with breakfast., Disp: 30 tablet, Rfl: 0  Not on File   Review of Systems  Constitutional: Positive for weight loss and malaise/fatigue.  Negative for fever and chills.  Respiratory: Negative for cough, shortness of breath and wheezing.   Cardiovascular: Negative for chest pain, palpitations and leg swelling.  Gastrointestinal: Positive for heartburn and abdominal pain.       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  Genitourinary: Negative for dysuria, urgency and frequency.  Musculoskeletal: Positive for joint pain (diffuse joint pain) and arthritis.  Neurological: Positive for weakness.      Objective  Filed Vitals:   09/24/15 1611  BP: 132/83  Pulse: 105  Resp: 16  Weight: 127 lb 6.4 oz (57.788 kg)    Physical Exam  Constitutional: He is oriented to person, place, and time. He appears distressed (Appears to feel ill.).  HENT:  Head:  Normocephalic and atraumatic.  Cardiovascular: Normal rate, regular rhythm and normal heart sounds.  Exam reveals no gallop and no friction rub.   No murmur heard. Pulmonary/Chest: Effort normal and breath sounds normal. No respiratory distress. He has no wheezes. He has no rales.  Abdominal: Soft. Bowel sounds are normal. He exhibits no distension and no mass. There is no tenderness.  Musculoskeletal: He exhibits no edema.  Diffuse joint pain   Neurological: He is alert and oriented to person, place, and time.       Recent Results (from the past 2160 hour(s))  Comprehensive metabolic panel     Status: Abnormal   Collection Time: 07/29/15 10:08 AM  Result Value Ref Range   Sodium 138 135 - 145 mmol/L   Potassium 3.1 (L) 3.5 - 5.1 mmol/L   Chloride 99 (L) 101 - 111 mmol/L   CO2 33 (H) 22 - 32 mmol/L   Glucose, Bld 125 (H) 65 - 99 mg/dL   BUN 19 6 - 20 mg/dL   Creatinine, Ser 0.70 0.61 - 1.24 mg/dL   Calcium 9.0 8.9 - 10.3 mg/dL   Total Protein 6.9 6.5 - 8.1 g/dL   Albumin 3.4 (L) 3.5 - 5.0 g/dL   AST 20 15 - 41 U/L   ALT 18 17 - 63 U/L   Alkaline Phosphatase 83 38 - 126 U/L   Total Bilirubin 1.4 (H) 0.3 - 1.2 mg/dL   GFR calc non Af Amer >60 >60 mL/min   GFR calc Af Amer >60 >60 mL/min    Comment: (NOTE) The eGFR has been calculated using the CKD EPI equation. This calculation has not been validated in all clinical situations. eGFR's persistently <60 mL/min signify possible Chronic Kidney Disease.    Anion gap 6 5 - 15  CBC with Differential     Status: Abnormal   Collection Time: 07/29/15 10:08 AM  Result Value Ref Range   WBC 10.1 3.8 - 10.6 K/uL   RBC 4.68 4.40 - 5.90 MIL/uL   Hemoglobin 14.4 13.0 - 18.0 g/dL   HCT 42.2 40.0 - 52.0 %   MCV 90.3 80.0 - 100.0 fL   MCH 30.9 26.0 - 34.0 pg   MCHC 34.2 32.0 - 36.0 g/dL   RDW 13.4 11.5 - 14.5 %   Platelets 192 150 - 440 K/uL   Neutrophils Relative % 85 %   Neutro Abs 8.5 (H) 1.4 - 6.5 K/uL   Lymphocytes Relative 6 %    Lymphs Abs 0.6 (L) 1.0 - 3.6 K/uL   Monocytes Relative 8 %   Monocytes Absolute 0.8 0.2 - 1.0 K/uL   Eosinophils Relative 0 %   Eosinophils Absolute 0.0 0 - 0.7 K/uL   Basophils Relative 1 %   Basophils  Absolute 0.1 0 - 0.1 K/uL  Urinalysis complete, with microscopic ([facility] only)     Status: Abnormal   Collection Time: 07/29/15 10:08 AM  Result Value Ref Range   Color, Urine YELLOW (A) YELLOW   APPearance CLEAR (A) CLEAR   Glucose, UA NEGATIVE NEGATIVE mg/dL   Bilirubin Urine NEGATIVE NEGATIVE   Ketones, ur NEGATIVE NEGATIVE mg/dL   Specific Gravity, Urine 1.030 1.005 - 1.030   Hgb urine dipstick NEGATIVE NEGATIVE   pH 5.0 5.0 - 8.0   Protein, ur 30 (A) NEGATIVE mg/dL   Nitrite NEGATIVE NEGATIVE   Leukocytes, UA NEGATIVE NEGATIVE   RBC / HPF 0-5 0 - 5 RBC/hpf   WBC, UA 0-5 0 - 5 WBC/hpf   Bacteria, UA NONE SEEN NONE SEEN   Squamous Epithelial / LPF NONE SEEN NONE SEEN   Mucous PRESENT   Body fluid culture     Status: None   Collection Time: 07/31/15 11:39 AM  Result Value Ref Range   Specimen Description KNEE    Special Requests NONE    Gram Stain MANY WBC SEEN NO ORGANISMS SEEN     Culture NO GROWTH 4 DAYS    Report Status 08/04/2015 FINAL   Synovial cell count + diff, w/ crystals     Status: Abnormal   Collection Time: 07/31/15 11:39 AM  Result Value Ref Range   Color, Synovial RED (A) YELLOW   Appearance-Synovial CLOUDY (A) CLEAR   Crystals, Fluid CALCIUM PHOSPHATE CRYSTALS    WBC, Synovial 25317 (H) 0 - 200 /cu mm   Neutrophil, Synovial 93 %   Lymphocytes-Synovial Fld 5 %   Monocyte-Macrophage-Synovial Fluid 2 %   Eosinophils-Synovial 0 %   Other Cells-SYN 0   Uric acid     Status: None   Collection Time: 07/31/15 11:39 AM  Result Value Ref Range   Uric Acid, Serum 4.9 4.4 - 7.6 mg/dL  CBC     Status: Abnormal   Collection Time: 07/31/15  8:55 PM  Result Value Ref Range   WBC 17.4 (H) 3.8 - 10.6 K/uL   RBC 4.38 (L) 4.40 - 5.90 MIL/uL   Hemoglobin 13.2  13.0 - 18.0 g/dL   HCT 39.7 (L) 40.0 - 52.0 %   MCV 90.6 80.0 - 100.0 fL   MCH 30.1 26.0 - 34.0 pg   MCHC 33.2 32.0 - 36.0 g/dL   RDW 14.0 11.5 - 14.5 %   Platelets 282 150 - 440 K/uL  Creatinine, serum     Status: None   Collection Time: 07/31/15  8:55 PM  Result Value Ref Range   Creatinine, Ser 0.68 0.61 - 1.24 mg/dL   GFR calc non Af Amer >60 >60 mL/min   GFR calc Af Amer >60 >60 mL/min    Comment: (NOTE) The eGFR has been calculated using the CKD EPI equation. This calculation has not been validated in all clinical situations. eGFR's persistently <60 mL/min signify possible Chronic Kidney Disease.   Basic metabolic panel     Status: Abnormal   Collection Time: 08/01/15  6:45 AM  Result Value Ref Range   Sodium 140 135 - 145 mmol/L   Potassium 4.4 3.5 - 5.1 mmol/L   Chloride 100 (L) 101 - 111 mmol/L   CO2 34 (H) 22 - 32 mmol/L   Glucose, Bld 128 (H) 65 - 99 mg/dL   BUN 22 (H) 6 - 20 mg/dL   Creatinine, Ser 0.56 (L) 0.61 - 1.24 mg/dL   Calcium 8.9 8.9 -  10.3 mg/dL   GFR calc non Af Amer >60 >60 mL/min   GFR calc Af Amer >60 >60 mL/min    Comment: (NOTE) The eGFR has been calculated using the CKD EPI equation. This calculation has not been validated in all clinical situations. eGFR's persistently <60 mL/min signify possible Chronic Kidney Disease.    Anion gap 6 5 - 15  CBC     Status: Abnormal   Collection Time: 08/01/15  6:45 AM  Result Value Ref Range   WBC 16.4 (H) 3.8 - 10.6 K/uL   RBC 4.30 (L) 4.40 - 5.90 MIL/uL   Hemoglobin 13.2 13.0 - 18.0 g/dL   HCT 38.8 (L) 40.0 - 52.0 %   MCV 90.4 80.0 - 100.0 fL   MCH 30.8 26.0 - 34.0 pg   MCHC 34.0 32.0 - 36.0 g/dL   RDW 13.7 11.5 - 14.5 %   Platelets 277 150 - 440 K/uL  C difficile quick scan w PCR reflex     Status: Abnormal   Collection Time: 08/02/15  8:35 AM  Result Value Ref Range   C Diff antigen POSITIVE (A) NEGATIVE   C Diff toxin POSITIVE (A) NEGATIVE   C Diff interpretation      Positive for toxigenic  C. difficile, active toxin production present.    Comment: CRITICAL RESULT CALLED TO, READ BACK BY AND VERIFIED WITH: DENISE FREEMAN 08/02/15 AT 1000 VKB   CBC and differential     Status: None   Collection Time: 08/07/15 12:00 AM  Result Value Ref Range   Hemoglobin 13.6 13.5 - 17.5 g/dL   HCT 42 41 - 53 %   Platelets 387 150 - 399 K/L   WBC 15.7 18^2/XH  Basic metabolic panel     Status: Abnormal   Collection Time: 08/07/15 12:00 AM  Result Value Ref Range   Glucose 97 mg/dL   BUN 13 4 - 21 mg/dL   Creatinine 0.6 0.6 - 1.3 mg/dL   Potassium 4.1 3.4 - 5.3 mmol/L   Sodium 136 (A) 137 - 147 mmol/L  CBC and differential     Status: Abnormal   Collection Time: 08/13/15 12:00 AM  Result Value Ref Range   Hemoglobin 13.3 (A) 13.5 - 17.5 g/dL   HCT 40 (A) 41 - 53 %   Platelets 512 (A) 150 - 399 K/L   WBC 11.8 37^1/IR  Basic Metabolic Panel (BMET)     Status: Abnormal   Collection Time: 08/30/15 11:39 AM  Result Value Ref Range   Glucose 130 (H) 65 - 99 mg/dL   BUN 12 8 - 27 mg/dL   Creatinine, Ser 0.52 (L) 0.76 - 1.27 mg/dL   GFR calc non Af Amer 104 >59 mL/min/1.73   GFR calc Af Amer 120 >59 mL/min/1.73   BUN/Creatinine Ratio 23 (H) 10 - 22   Sodium 139 136 - 144 mmol/L   Potassium 4.3 3.5 - 5.2 mmol/L   Chloride 94 (L) 97 - 106 mmol/L   CO2 27 18 - 29 mmol/L   Calcium 8.4 (L) 8.6 - 10.2 mg/dL     Assessment & Plan  Problem List Items Addressed This Visit      Digestive   Gastric outlet obstruction   Relevant Orders   Ambulatory referral to Gastroenterology     Musculoskeletal and Integument   Arthritis - Primary   Relevant Medications   predniSONE (DELTASONE) 20 MG tablet   oxycodone (OXY-IR) 5 MG capsule     Other  Chronic pain syndrome   Relevant Orders   Ambulatory referral to Pain Clinic      Meds ordered this encounter  Medications  . DISCONTD: colchicine 0.6 MG tablet    Sig: Take 0.6 mg by mouth daily.    Refill:  0  . predniSONE (DELTASONE)  20 MG tablet    Sig: Take 1 tablet (20 mg total) by mouth daily with breakfast.    Dispense:  30 tablet    Refill:  0  . oxycodone (OXY-IR) 5 MG capsule    Sig: Take 1 tablet every 6 hours as needed for pain    Dispense:  120 capsule    Refill:  0   1. Arthritis  - predniSONE (DELTASONE) 20 MG tablet; Take 1 tablet (20 mg total) by mouth daily with breakfast.  Dispense: 30 tablet; Refill: 0 - oxycodone (OXY-IR) 5 MG capsule; Take 1 tablet every 6 hours as needed for pain  Dispense: 120 capsule; Refill: 0  2. Gastric outlet obstruction  - Ambulatory referral to Gastroenterology  3. Chronic pain syndrome  - Ambulatory referral to Pain Clinic

## 2015-09-24 NOTE — Patient Instructions (Signed)
Keep Rheumatology appointment and also appts to GI and Pain Management.  Do not take more than Tylenol 500 mg., only max of 6 a day.

## 2015-09-25 DIAGNOSIS — I10 Essential (primary) hypertension: Secondary | ICD-10-CM | POA: Diagnosis not present

## 2015-09-25 DIAGNOSIS — G8929 Other chronic pain: Secondary | ICD-10-CM | POA: Diagnosis not present

## 2015-09-25 DIAGNOSIS — Z79891 Long term (current) use of opiate analgesic: Secondary | ICD-10-CM | POA: Diagnosis not present

## 2015-09-25 DIAGNOSIS — M6281 Muscle weakness (generalized): Secondary | ICD-10-CM | POA: Diagnosis not present

## 2015-09-25 DIAGNOSIS — K219 Gastro-esophageal reflux disease without esophagitis: Secondary | ICD-10-CM | POA: Diagnosis not present

## 2015-09-25 DIAGNOSIS — Z8719 Personal history of other diseases of the digestive system: Secondary | ICD-10-CM | POA: Diagnosis not present

## 2015-09-25 DIAGNOSIS — M549 Dorsalgia, unspecified: Secondary | ICD-10-CM | POA: Diagnosis not present

## 2015-09-25 DIAGNOSIS — Z8619 Personal history of other infectious and parasitic diseases: Secondary | ICD-10-CM | POA: Diagnosis not present

## 2015-09-25 DIAGNOSIS — M11862 Other specified crystal arthropathies, left knee: Secondary | ICD-10-CM | POA: Diagnosis not present

## 2015-09-25 DIAGNOSIS — E785 Hyperlipidemia, unspecified: Secondary | ICD-10-CM | POA: Diagnosis not present

## 2015-09-25 DIAGNOSIS — M1990 Unspecified osteoarthritis, unspecified site: Secondary | ICD-10-CM | POA: Diagnosis not present

## 2015-09-26 DIAGNOSIS — M11862 Other specified crystal arthropathies, left knee: Secondary | ICD-10-CM | POA: Diagnosis not present

## 2015-09-26 DIAGNOSIS — M1990 Unspecified osteoarthritis, unspecified site: Secondary | ICD-10-CM | POA: Diagnosis not present

## 2015-09-26 DIAGNOSIS — Z8719 Personal history of other diseases of the digestive system: Secondary | ICD-10-CM | POA: Diagnosis not present

## 2015-09-26 DIAGNOSIS — E785 Hyperlipidemia, unspecified: Secondary | ICD-10-CM | POA: Diagnosis not present

## 2015-09-26 DIAGNOSIS — Z79891 Long term (current) use of opiate analgesic: Secondary | ICD-10-CM | POA: Diagnosis not present

## 2015-09-26 DIAGNOSIS — I10 Essential (primary) hypertension: Secondary | ICD-10-CM | POA: Diagnosis not present

## 2015-09-26 DIAGNOSIS — Z8619 Personal history of other infectious and parasitic diseases: Secondary | ICD-10-CM | POA: Diagnosis not present

## 2015-09-26 DIAGNOSIS — G8929 Other chronic pain: Secondary | ICD-10-CM | POA: Diagnosis not present

## 2015-09-26 DIAGNOSIS — K219 Gastro-esophageal reflux disease without esophagitis: Secondary | ICD-10-CM | POA: Diagnosis not present

## 2015-09-26 DIAGNOSIS — M6281 Muscle weakness (generalized): Secondary | ICD-10-CM | POA: Diagnosis not present

## 2015-09-26 DIAGNOSIS — M549 Dorsalgia, unspecified: Secondary | ICD-10-CM | POA: Diagnosis not present

## 2015-09-27 DIAGNOSIS — M549 Dorsalgia, unspecified: Secondary | ICD-10-CM | POA: Diagnosis not present

## 2015-09-27 DIAGNOSIS — Z8719 Personal history of other diseases of the digestive system: Secondary | ICD-10-CM | POA: Diagnosis not present

## 2015-09-27 DIAGNOSIS — I10 Essential (primary) hypertension: Secondary | ICD-10-CM | POA: Diagnosis not present

## 2015-09-27 DIAGNOSIS — G8929 Other chronic pain: Secondary | ICD-10-CM | POA: Diagnosis not present

## 2015-09-27 DIAGNOSIS — M11862 Other specified crystal arthropathies, left knee: Secondary | ICD-10-CM | POA: Diagnosis not present

## 2015-09-27 DIAGNOSIS — Z8619 Personal history of other infectious and parasitic diseases: Secondary | ICD-10-CM | POA: Diagnosis not present

## 2015-09-27 DIAGNOSIS — E785 Hyperlipidemia, unspecified: Secondary | ICD-10-CM | POA: Diagnosis not present

## 2015-09-27 DIAGNOSIS — M6281 Muscle weakness (generalized): Secondary | ICD-10-CM | POA: Diagnosis not present

## 2015-09-27 DIAGNOSIS — M1990 Unspecified osteoarthritis, unspecified site: Secondary | ICD-10-CM | POA: Diagnosis not present

## 2015-09-27 DIAGNOSIS — K219 Gastro-esophageal reflux disease without esophagitis: Secondary | ICD-10-CM | POA: Diagnosis not present

## 2015-09-27 DIAGNOSIS — Z79891 Long term (current) use of opiate analgesic: Secondary | ICD-10-CM | POA: Diagnosis not present

## 2015-09-28 DIAGNOSIS — Z79891 Long term (current) use of opiate analgesic: Secondary | ICD-10-CM | POA: Diagnosis not present

## 2015-09-28 DIAGNOSIS — Z8719 Personal history of other diseases of the digestive system: Secondary | ICD-10-CM | POA: Diagnosis not present

## 2015-09-28 DIAGNOSIS — E785 Hyperlipidemia, unspecified: Secondary | ICD-10-CM | POA: Diagnosis not present

## 2015-09-28 DIAGNOSIS — K219 Gastro-esophageal reflux disease without esophagitis: Secondary | ICD-10-CM | POA: Diagnosis not present

## 2015-09-28 DIAGNOSIS — M549 Dorsalgia, unspecified: Secondary | ICD-10-CM | POA: Diagnosis not present

## 2015-09-28 DIAGNOSIS — I10 Essential (primary) hypertension: Secondary | ICD-10-CM | POA: Diagnosis not present

## 2015-09-28 DIAGNOSIS — Z8619 Personal history of other infectious and parasitic diseases: Secondary | ICD-10-CM | POA: Diagnosis not present

## 2015-09-28 DIAGNOSIS — G8929 Other chronic pain: Secondary | ICD-10-CM | POA: Diagnosis not present

## 2015-09-28 DIAGNOSIS — M1990 Unspecified osteoarthritis, unspecified site: Secondary | ICD-10-CM | POA: Diagnosis not present

## 2015-09-28 DIAGNOSIS — M11862 Other specified crystal arthropathies, left knee: Secondary | ICD-10-CM | POA: Diagnosis not present

## 2015-09-28 DIAGNOSIS — M6281 Muscle weakness (generalized): Secondary | ICD-10-CM | POA: Diagnosis not present

## 2015-10-01 DIAGNOSIS — Z8619 Personal history of other infectious and parasitic diseases: Secondary | ICD-10-CM | POA: Diagnosis not present

## 2015-10-01 DIAGNOSIS — Z79891 Long term (current) use of opiate analgesic: Secondary | ICD-10-CM | POA: Diagnosis not present

## 2015-10-01 DIAGNOSIS — M549 Dorsalgia, unspecified: Secondary | ICD-10-CM | POA: Diagnosis not present

## 2015-10-01 DIAGNOSIS — M6281 Muscle weakness (generalized): Secondary | ICD-10-CM | POA: Diagnosis not present

## 2015-10-01 DIAGNOSIS — K219 Gastro-esophageal reflux disease without esophagitis: Secondary | ICD-10-CM | POA: Diagnosis not present

## 2015-10-01 DIAGNOSIS — E785 Hyperlipidemia, unspecified: Secondary | ICD-10-CM | POA: Diagnosis not present

## 2015-10-01 DIAGNOSIS — M11862 Other specified crystal arthropathies, left knee: Secondary | ICD-10-CM | POA: Diagnosis not present

## 2015-10-01 DIAGNOSIS — I10 Essential (primary) hypertension: Secondary | ICD-10-CM | POA: Diagnosis not present

## 2015-10-01 DIAGNOSIS — M1990 Unspecified osteoarthritis, unspecified site: Secondary | ICD-10-CM | POA: Diagnosis not present

## 2015-10-01 DIAGNOSIS — G8929 Other chronic pain: Secondary | ICD-10-CM | POA: Diagnosis not present

## 2015-10-01 DIAGNOSIS — Z8719 Personal history of other diseases of the digestive system: Secondary | ICD-10-CM | POA: Diagnosis not present

## 2015-10-02 DIAGNOSIS — G8929 Other chronic pain: Secondary | ICD-10-CM | POA: Diagnosis not present

## 2015-10-02 DIAGNOSIS — M1990 Unspecified osteoarthritis, unspecified site: Secondary | ICD-10-CM | POA: Diagnosis not present

## 2015-10-02 DIAGNOSIS — M11862 Other specified crystal arthropathies, left knee: Secondary | ICD-10-CM | POA: Diagnosis not present

## 2015-10-02 DIAGNOSIS — M6281 Muscle weakness (generalized): Secondary | ICD-10-CM | POA: Diagnosis not present

## 2015-10-02 DIAGNOSIS — Z8719 Personal history of other diseases of the digestive system: Secondary | ICD-10-CM | POA: Diagnosis not present

## 2015-10-02 DIAGNOSIS — Z79891 Long term (current) use of opiate analgesic: Secondary | ICD-10-CM | POA: Diagnosis not present

## 2015-10-02 DIAGNOSIS — Z8619 Personal history of other infectious and parasitic diseases: Secondary | ICD-10-CM | POA: Diagnosis not present

## 2015-10-02 DIAGNOSIS — K219 Gastro-esophageal reflux disease without esophagitis: Secondary | ICD-10-CM | POA: Diagnosis not present

## 2015-10-02 DIAGNOSIS — M549 Dorsalgia, unspecified: Secondary | ICD-10-CM | POA: Diagnosis not present

## 2015-10-02 DIAGNOSIS — E785 Hyperlipidemia, unspecified: Secondary | ICD-10-CM | POA: Diagnosis not present

## 2015-10-02 DIAGNOSIS — I10 Essential (primary) hypertension: Secondary | ICD-10-CM | POA: Diagnosis not present

## 2015-10-03 DIAGNOSIS — M1990 Unspecified osteoarthritis, unspecified site: Secondary | ICD-10-CM | POA: Diagnosis not present

## 2015-10-03 DIAGNOSIS — K219 Gastro-esophageal reflux disease without esophagitis: Secondary | ICD-10-CM | POA: Diagnosis not present

## 2015-10-03 DIAGNOSIS — M1180 Other specified crystal arthropathies, unspecified site: Secondary | ICD-10-CM | POA: Diagnosis not present

## 2015-10-03 DIAGNOSIS — Z8619 Personal history of other infectious and parasitic diseases: Secondary | ICD-10-CM | POA: Diagnosis not present

## 2015-10-03 DIAGNOSIS — E785 Hyperlipidemia, unspecified: Secondary | ICD-10-CM | POA: Diagnosis not present

## 2015-10-03 DIAGNOSIS — Z8719 Personal history of other diseases of the digestive system: Secondary | ICD-10-CM | POA: Diagnosis not present

## 2015-10-03 DIAGNOSIS — M6281 Muscle weakness (generalized): Secondary | ICD-10-CM | POA: Diagnosis not present

## 2015-10-03 DIAGNOSIS — M549 Dorsalgia, unspecified: Secondary | ICD-10-CM | POA: Diagnosis not present

## 2015-10-03 DIAGNOSIS — E559 Vitamin D deficiency, unspecified: Secondary | ICD-10-CM | POA: Diagnosis not present

## 2015-10-03 DIAGNOSIS — R5382 Chronic fatigue, unspecified: Secondary | ICD-10-CM | POA: Insufficient documentation

## 2015-10-03 DIAGNOSIS — Z79891 Long term (current) use of opiate analgesic: Secondary | ICD-10-CM | POA: Diagnosis not present

## 2015-10-03 DIAGNOSIS — M11862 Other specified crystal arthropathies, left knee: Secondary | ICD-10-CM | POA: Diagnosis not present

## 2015-10-03 DIAGNOSIS — M2550 Pain in unspecified joint: Secondary | ICD-10-CM | POA: Diagnosis not present

## 2015-10-03 DIAGNOSIS — M25532 Pain in left wrist: Secondary | ICD-10-CM | POA: Diagnosis not present

## 2015-10-03 DIAGNOSIS — I10 Essential (primary) hypertension: Secondary | ICD-10-CM | POA: Diagnosis not present

## 2015-10-03 DIAGNOSIS — R7982 Elevated C-reactive protein (CRP): Secondary | ICD-10-CM | POA: Diagnosis not present

## 2015-10-03 DIAGNOSIS — M1189 Other specified crystal arthropathies, multiple sites: Secondary | ICD-10-CM | POA: Insufficient documentation

## 2015-10-03 DIAGNOSIS — G8929 Other chronic pain: Secondary | ICD-10-CM | POA: Diagnosis not present

## 2015-10-03 DIAGNOSIS — Z79899 Other long term (current) drug therapy: Secondary | ICD-10-CM | POA: Diagnosis not present

## 2015-10-03 DIAGNOSIS — Z7952 Long term (current) use of systemic steroids: Secondary | ICD-10-CM | POA: Diagnosis not present

## 2015-10-04 DIAGNOSIS — G8929 Other chronic pain: Secondary | ICD-10-CM | POA: Diagnosis not present

## 2015-10-04 DIAGNOSIS — Z8719 Personal history of other diseases of the digestive system: Secondary | ICD-10-CM | POA: Diagnosis not present

## 2015-10-04 DIAGNOSIS — M1990 Unspecified osteoarthritis, unspecified site: Secondary | ICD-10-CM | POA: Diagnosis not present

## 2015-10-04 DIAGNOSIS — Z8619 Personal history of other infectious and parasitic diseases: Secondary | ICD-10-CM | POA: Diagnosis not present

## 2015-10-04 DIAGNOSIS — M11862 Other specified crystal arthropathies, left knee: Secondary | ICD-10-CM | POA: Diagnosis not present

## 2015-10-04 DIAGNOSIS — M549 Dorsalgia, unspecified: Secondary | ICD-10-CM | POA: Diagnosis not present

## 2015-10-04 DIAGNOSIS — I10 Essential (primary) hypertension: Secondary | ICD-10-CM | POA: Diagnosis not present

## 2015-10-04 DIAGNOSIS — Z79891 Long term (current) use of opiate analgesic: Secondary | ICD-10-CM | POA: Diagnosis not present

## 2015-10-04 DIAGNOSIS — K219 Gastro-esophageal reflux disease without esophagitis: Secondary | ICD-10-CM | POA: Diagnosis not present

## 2015-10-04 DIAGNOSIS — E785 Hyperlipidemia, unspecified: Secondary | ICD-10-CM | POA: Diagnosis not present

## 2015-10-04 DIAGNOSIS — M6281 Muscle weakness (generalized): Secondary | ICD-10-CM | POA: Diagnosis not present

## 2015-10-05 ENCOUNTER — Other Ambulatory Visit: Payer: Self-pay

## 2015-10-08 DIAGNOSIS — M11862 Other specified crystal arthropathies, left knee: Secondary | ICD-10-CM | POA: Diagnosis not present

## 2015-10-08 DIAGNOSIS — Z79891 Long term (current) use of opiate analgesic: Secondary | ICD-10-CM | POA: Diagnosis not present

## 2015-10-08 DIAGNOSIS — G8929 Other chronic pain: Secondary | ICD-10-CM | POA: Diagnosis not present

## 2015-10-08 DIAGNOSIS — I10 Essential (primary) hypertension: Secondary | ICD-10-CM | POA: Diagnosis not present

## 2015-10-08 DIAGNOSIS — K219 Gastro-esophageal reflux disease without esophagitis: Secondary | ICD-10-CM | POA: Diagnosis not present

## 2015-10-08 DIAGNOSIS — Z8619 Personal history of other infectious and parasitic diseases: Secondary | ICD-10-CM | POA: Diagnosis not present

## 2015-10-08 DIAGNOSIS — M6281 Muscle weakness (generalized): Secondary | ICD-10-CM | POA: Diagnosis not present

## 2015-10-08 DIAGNOSIS — Z8719 Personal history of other diseases of the digestive system: Secondary | ICD-10-CM | POA: Diagnosis not present

## 2015-10-08 DIAGNOSIS — M1990 Unspecified osteoarthritis, unspecified site: Secondary | ICD-10-CM | POA: Diagnosis not present

## 2015-10-08 DIAGNOSIS — E785 Hyperlipidemia, unspecified: Secondary | ICD-10-CM | POA: Diagnosis not present

## 2015-10-08 DIAGNOSIS — M549 Dorsalgia, unspecified: Secondary | ICD-10-CM | POA: Diagnosis not present

## 2015-10-09 DIAGNOSIS — M1990 Unspecified osteoarthritis, unspecified site: Secondary | ICD-10-CM | POA: Diagnosis not present

## 2015-10-09 DIAGNOSIS — Z8719 Personal history of other diseases of the digestive system: Secondary | ICD-10-CM | POA: Diagnosis not present

## 2015-10-09 DIAGNOSIS — M549 Dorsalgia, unspecified: Secondary | ICD-10-CM | POA: Diagnosis not present

## 2015-10-09 DIAGNOSIS — E785 Hyperlipidemia, unspecified: Secondary | ICD-10-CM | POA: Diagnosis not present

## 2015-10-09 DIAGNOSIS — Z8619 Personal history of other infectious and parasitic diseases: Secondary | ICD-10-CM | POA: Diagnosis not present

## 2015-10-09 DIAGNOSIS — K219 Gastro-esophageal reflux disease without esophagitis: Secondary | ICD-10-CM | POA: Diagnosis not present

## 2015-10-09 DIAGNOSIS — Z79891 Long term (current) use of opiate analgesic: Secondary | ICD-10-CM | POA: Diagnosis not present

## 2015-10-09 DIAGNOSIS — M6281 Muscle weakness (generalized): Secondary | ICD-10-CM | POA: Diagnosis not present

## 2015-10-09 DIAGNOSIS — G8929 Other chronic pain: Secondary | ICD-10-CM | POA: Diagnosis not present

## 2015-10-09 DIAGNOSIS — M11862 Other specified crystal arthropathies, left knee: Secondary | ICD-10-CM | POA: Diagnosis not present

## 2015-10-09 DIAGNOSIS — I10 Essential (primary) hypertension: Secondary | ICD-10-CM | POA: Diagnosis not present

## 2015-10-10 DIAGNOSIS — M1990 Unspecified osteoarthritis, unspecified site: Secondary | ICD-10-CM | POA: Diagnosis not present

## 2015-10-10 DIAGNOSIS — M6281 Muscle weakness (generalized): Secondary | ICD-10-CM | POA: Diagnosis not present

## 2015-10-10 DIAGNOSIS — M549 Dorsalgia, unspecified: Secondary | ICD-10-CM | POA: Diagnosis not present

## 2015-10-10 DIAGNOSIS — G8929 Other chronic pain: Secondary | ICD-10-CM | POA: Diagnosis not present

## 2015-10-10 DIAGNOSIS — I10 Essential (primary) hypertension: Secondary | ICD-10-CM | POA: Diagnosis not present

## 2015-10-10 DIAGNOSIS — Z79891 Long term (current) use of opiate analgesic: Secondary | ICD-10-CM | POA: Diagnosis not present

## 2015-10-10 DIAGNOSIS — Z8619 Personal history of other infectious and parasitic diseases: Secondary | ICD-10-CM | POA: Diagnosis not present

## 2015-10-10 DIAGNOSIS — E785 Hyperlipidemia, unspecified: Secondary | ICD-10-CM | POA: Diagnosis not present

## 2015-10-10 DIAGNOSIS — K219 Gastro-esophageal reflux disease without esophagitis: Secondary | ICD-10-CM | POA: Diagnosis not present

## 2015-10-10 DIAGNOSIS — M11862 Other specified crystal arthropathies, left knee: Secondary | ICD-10-CM | POA: Diagnosis not present

## 2015-10-10 DIAGNOSIS — Z8719 Personal history of other diseases of the digestive system: Secondary | ICD-10-CM | POA: Diagnosis not present

## 2015-10-12 DIAGNOSIS — Z79891 Long term (current) use of opiate analgesic: Secondary | ICD-10-CM | POA: Diagnosis not present

## 2015-10-12 DIAGNOSIS — K219 Gastro-esophageal reflux disease without esophagitis: Secondary | ICD-10-CM | POA: Diagnosis not present

## 2015-10-12 DIAGNOSIS — I10 Essential (primary) hypertension: Secondary | ICD-10-CM | POA: Diagnosis not present

## 2015-10-12 DIAGNOSIS — G8929 Other chronic pain: Secondary | ICD-10-CM | POA: Diagnosis not present

## 2015-10-12 DIAGNOSIS — Z8619 Personal history of other infectious and parasitic diseases: Secondary | ICD-10-CM | POA: Diagnosis not present

## 2015-10-12 DIAGNOSIS — M11862 Other specified crystal arthropathies, left knee: Secondary | ICD-10-CM | POA: Diagnosis not present

## 2015-10-12 DIAGNOSIS — M549 Dorsalgia, unspecified: Secondary | ICD-10-CM | POA: Diagnosis not present

## 2015-10-12 DIAGNOSIS — Z8719 Personal history of other diseases of the digestive system: Secondary | ICD-10-CM | POA: Diagnosis not present

## 2015-10-12 DIAGNOSIS — E785 Hyperlipidemia, unspecified: Secondary | ICD-10-CM | POA: Diagnosis not present

## 2015-10-12 DIAGNOSIS — M1990 Unspecified osteoarthritis, unspecified site: Secondary | ICD-10-CM | POA: Diagnosis not present

## 2015-10-12 DIAGNOSIS — M6281 Muscle weakness (generalized): Secondary | ICD-10-CM | POA: Diagnosis not present

## 2015-10-15 ENCOUNTER — Encounter: Payer: Self-pay | Admitting: Gastroenterology

## 2015-10-15 ENCOUNTER — Ambulatory Visit (INDEPENDENT_AMBULATORY_CARE_PROVIDER_SITE_OTHER): Payer: Medicare Other | Admitting: Gastroenterology

## 2015-10-15 ENCOUNTER — Other Ambulatory Visit: Payer: Self-pay

## 2015-10-15 VITALS — BP 102/67 | HR 90 | Temp 98.4°F | Ht 67.0 in | Wt 136.0 lb

## 2015-10-15 DIAGNOSIS — R1310 Dysphagia, unspecified: Secondary | ICD-10-CM | POA: Diagnosis not present

## 2015-10-15 NOTE — Progress Notes (Signed)
Primary Care Physician: [name], MD  Primary Gastroenterologist:  Dr. Lucilla Lame  Chief Complaint  Patient presents with  . Gastric Outlet Obstruction    HPI: Zachary Brewer is a 74 y.o. male here for follow-up after having a upper endoscopy. 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 The patient had the procedure complicated by aspiration of the large amount of food and fluid in his stomach at the time of the procedure. The patient underwent a bronchoscopy and has had recurrent symptoms since. The patient states that he is eating much better now than he had in the past. The patient comes with a family member who states that the patient is now also gaining weight. His intermittent symptoms when he does not chew his food well.  Current Outpatient Prescriptions  Medication Sig Dispense Refill  . acetaminophen (TYLENOL) 325 MG tablet Take 650 mg by mouth every 6 (six) hours as needed.    . colchicine 0.6 MG tablet Take 0.6 mg by mouth daily.  0  . docusate sodium (COLACE) 100 MG capsule Take 100 mg by mouth daily as needed for mild constipation.    . furosemide (LASIX) 20 MG tablet Take 1 tablet daily for 1 week, then 1 tablet every other day thereafter. 30 tablet 3  . meloxicam (MOBIC) 7.5 MG tablet Take 7.5 mg by mouth daily.  2  . Multiple Vitamin (MULTIVITAMIN) tablet Take 1 tablet by mouth daily.    Marland Kitchen omeprazole (PRILOSEC) 20 MG capsule Take 1 capsule (20 mg total) by mouth 2 (two) times daily before a meal. 60 capsule 12  . oxycodone (OXY-IR) 5 MG capsule Take 1 tablet every 6 hours as needed for pain 120 capsule 0  . predniSONE (DELTASONE) 20 MG tablet Take 1 tablet (20 mg total) by mouth daily with breakfast. 30 tablet 0  . tamsulosin (FLOMAX) 0.4 MG CAPS capsule Take 1 capsule (0.4 mg total) by mouth daily after breakfast. 30 capsule 12  . Vitamin D, Ergocalciferol, (DRISDOL) 50000 UNITS CAPS capsule Take 50,000  Units by mouth once a week.  0   No current facility-administered medications for this visit.    Allergies as of 10/15/2015  . (No Known Allergies)    ROS:  General: Negative for anorexia, weight loss, fever, chills, fatigue, weakness. ENT: Negative for hoarseness, difficulty swallowing , nasal congestion. CV: Negative for chest pain, angina, palpitations, dyspnea on exertion, peripheral edema.  Respiratory: Negative for dyspnea at rest, dyspnea on exertion, cough, sputum, wheezing.  GI: See history of present illness. GU:  Negative for dysuria, hematuria, urinary incontinence, urinary frequency, nocturnal urination.  Endo: Negative for unusual weight change.    Physical Examination:   BP 102/67 mmHg  Pulse 90  Temp(Src) 98.4 F (36.9 C)  Ht 5\' 7"  (1.702 m)  Wt 136 lb (61.689 kg)  BMI 21.30 kg/m2  General: Well-nourished, well-developed in no acute distress.  Eyes: No icterus. Conjunctivae pink. Mouth: Oropharyngeal mucosa moist and pink , no lesions erythema or exudate. Lungs: Clear to auscultation bilaterally. Non-labored. Heart: Regular rate and rhythm, no murmurs rubs or gallops.  Abdomen: Bowel sounds are normal, nontender, nondistended, no hepatosplenomegaly or masses, no abdominal bruits or hernia , no rebound or guarding.   Extremities: No lower extremity edema. No clubbing or deformities. Neuro: Alert and oriented x 3.  Grossly intact. Skin: Warm and dry, no jaundice.   Psych: Alert and cooperative, normal mood and  affect.  Labs:    Imaging Studies: No results found.  Assessment and Plan:   Zachary Brewer is a 74 y.o. 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 The patient states he is doing much better and would like to continue monitoring to see if he has any worsening of symptoms before setting himself up for another EGD. The patient has been told that as long as he is doing well and gaining weight he  should contact me only if he starts having more problems.   Note: This dictation was prepared with Dragon dictation along with smaller phrase technology. Any transcriptional errors that result from this process are unintentional.

## 2015-10-24 DIAGNOSIS — M1180 Other specified crystal arthropathies, unspecified site: Secondary | ICD-10-CM | POA: Diagnosis not present

## 2015-10-24 DIAGNOSIS — E559 Vitamin D deficiency, unspecified: Secondary | ICD-10-CM | POA: Insufficient documentation

## 2015-10-24 DIAGNOSIS — R7982 Elevated C-reactive protein (CRP): Secondary | ICD-10-CM | POA: Diagnosis not present

## 2015-10-24 DIAGNOSIS — R5382 Chronic fatigue, unspecified: Secondary | ICD-10-CM | POA: Diagnosis not present

## 2015-10-24 DIAGNOSIS — R700 Elevated erythrocyte sedimentation rate: Secondary | ICD-10-CM | POA: Diagnosis not present

## 2015-11-08 ENCOUNTER — Encounter: Payer: Self-pay | Admitting: Pain Medicine

## 2015-11-08 ENCOUNTER — Ambulatory Visit: Payer: Medicare Other | Attending: Pain Medicine | Admitting: Pain Medicine

## 2015-11-08 VITALS — BP 121/58 | HR 95 | Temp 98.3°F | Resp 16 | Ht 67.0 in | Wt 127.0 lb

## 2015-11-08 DIAGNOSIS — M19041 Primary osteoarthritis, right hand: Secondary | ICD-10-CM | POA: Insufficient documentation

## 2015-11-08 DIAGNOSIS — G8929 Other chronic pain: Secondary | ICD-10-CM | POA: Diagnosis not present

## 2015-11-08 DIAGNOSIS — M109 Gout, unspecified: Secondary | ICD-10-CM | POA: Diagnosis not present

## 2015-11-08 DIAGNOSIS — F418 Other specified anxiety disorders: Secondary | ICD-10-CM | POA: Diagnosis not present

## 2015-11-08 DIAGNOSIS — M19012 Primary osteoarthritis, left shoulder: Secondary | ICD-10-CM | POA: Insufficient documentation

## 2015-11-08 DIAGNOSIS — R52 Pain, unspecified: Secondary | ICD-10-CM | POA: Diagnosis present

## 2015-11-08 DIAGNOSIS — M5126 Other intervertebral disc displacement, lumbar region: Secondary | ICD-10-CM | POA: Diagnosis not present

## 2015-11-08 DIAGNOSIS — M19011 Primary osteoarthritis, right shoulder: Secondary | ICD-10-CM | POA: Diagnosis not present

## 2015-11-08 DIAGNOSIS — M1000 Idiopathic gout, unspecified site: Secondary | ICD-10-CM

## 2015-11-08 DIAGNOSIS — M5030 Other cervical disc degeneration, unspecified cervical region: Secondary | ICD-10-CM | POA: Insufficient documentation

## 2015-11-08 DIAGNOSIS — M179 Osteoarthritis of knee, unspecified: Secondary | ICD-10-CM | POA: Diagnosis not present

## 2015-11-08 DIAGNOSIS — M5134 Other intervertebral disc degeneration, thoracic region: Secondary | ICD-10-CM | POA: Diagnosis not present

## 2015-11-08 DIAGNOSIS — Z79899 Other long term (current) drug therapy: Secondary | ICD-10-CM | POA: Diagnosis not present

## 2015-11-08 DIAGNOSIS — M170 Bilateral primary osteoarthritis of knee: Secondary | ICD-10-CM | POA: Diagnosis not present

## 2015-11-08 DIAGNOSIS — K219 Gastro-esophageal reflux disease without esophagitis: Secondary | ICD-10-CM | POA: Diagnosis not present

## 2015-11-08 DIAGNOSIS — M5136 Other intervertebral disc degeneration, lumbar region: Secondary | ICD-10-CM

## 2015-11-08 DIAGNOSIS — Z79891 Long term (current) use of opiate analgesic: Secondary | ICD-10-CM | POA: Diagnosis not present

## 2015-11-08 DIAGNOSIS — Z5181 Encounter for therapeutic drug level monitoring: Secondary | ICD-10-CM | POA: Diagnosis not present

## 2015-11-08 DIAGNOSIS — M19042 Primary osteoarthritis, left hand: Secondary | ICD-10-CM | POA: Diagnosis not present

## 2015-11-08 MED ORDER — DICLOFENAC SODIUM 1 % TD GEL: TRANSDERMAL | Status: DC

## 2015-11-08 NOTE — Progress Notes (Signed)
Safety precautions to be maintained throughout the outpatient stay will include: orient to surroundings, keep bed in low position, maintain call bell within reach at all times, provide assistance with transfer out of bed and ambulation.  

## 2015-11-08 NOTE — Progress Notes (Signed)
Subjective:    Patient ID: Zachary Brewer, male    DOB: 14-Jun-1941, 75 y.o.   MRN: [id number]  HPI  The patient is a 75 year old gentleman who comes to pain management at the request of Dr. Larene Beach for further evaluation and treatment of pain involving multiple regions. The patient missed a pain involving the back shoulders knees. The patient also has been informed that he has scalp. We discussed patient's condition on today's visit The patient was accompanied by daughter who helped provide history. The patient stated that the most bothersome pain involving the knees and that the pain was described as aching agonizing throbbing exhausting sensation interfering with patient ability to pain restful sleep. The patient stated the pain increases with climbing sitting standing walking and decreased with resting warm showers and baths and medications. Decision has been made to proceed with geniculate nerve blocks of the knee at time return appointment and consider additional modifications of treatment pending further assessment of patient's condition. All agreed with suggested treatment plan.    Review of Systems     Cardiovascular: Heart murmur  Pulmonary: Questionable asthma  Neurological: Unremarkable  Psychological: Depression Anxiety  Gastrointestinal: Gastroesophageal reflux disease  Genitourinary: Unremarkable  Hematologic: Unremarkable  Endocrine: Unremarkable  Rheumatological: Osteoarthritis(gout)  Musculoskeletal: Unremarkable  Other significant: Weight loss      Objective:   Physical Exam  Patient was in elderly gentleman appearing in no acute distress. There was tends to palpation of the splenius capitis and a separate talus musculature regions of mild degree with mild tenderness of the acromioclavicular and glenohumeral joint regions as well there was mild tinnitus of the cervical facet cervical paraspinal musculature region. The patient appeared to be  unremarkable Spurling's maneuver. The patient was a slightly decreased grip strength. There was mild tends to palpation of base of the thumb with no increased warmth and erythema in the region of the thumb. There was no excessive tends to palpation of the medial and lateral epicondyles of the elbows of the left of right upper extremities. There was tends to palpation over the region of the thoracic facet thoracic paraspinal musculature with no crepitus of the thoracic region noted. Palpation over the lower thoracic paraspinal misreading was evidence of mild muscle spasms. Palpation over the lumbar paraspinal muscles lumbar facet region was tenderness to palpation over the lumbar paraspinal must reason lumbar facet region on the left as well as on the right with moderate tends to palpation over the PSIS and PII S region with mild tenderness to palpation greater trochanteric region iliotibial band region. The knee was without increased warmth or erythema there was crepitus of the knee with negative anterior and posterior drawer signs without ballottement of the patella there was tenderness to palpation of the knee of moderate to moderately severe degree. Straight leg raising was tolerated to approximately 20 without increased pain with dorsiflexion noted. There was negative clonus negative Homans. No sensory deficit dermatomal dystrophy detected. Abdomen nontender with no costovertebral tenderness noted.      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 Caution Mobic can cause bleeding ulcers of the stomach and GI tract as well as liver and kidney damage. Begin Voltaren Gel applications to knee as discussed   Geniculate nerve blocks of need to be performed at time  of return appointment  F/U PCP Dr. Luan Pulling for  evaliation of  BP and general medical  condition  F/U surgical evaluation. May consider pending follow-up evaluations  F/U neurological evaluation. May consider pending follow-up evaluations  May consider radiofrequency rhizolysis or intraspinal procedures pending response to present treatment and F/U evaluation   Patient to call Pain Management Center should patient have concerns prior to scheduled return appointment.

## 2015-11-08 NOTE — Patient Instructions (Addendum)
Continue present medication oxycodone and Mobic for now. Caution Mobic can cause bleeding ulcers of the stomach and GI tract as well as liver and kidney damage. Begin Voltaren Gel applications to knee as discussed  Geniculate nerve blocks of need to be performed at time of return appointment  F/U PCP Dr. Luan Pulling for evaliation of  BP and general medical  condition  F/U surgical evaluation. May consider pending follow-up evaluations  F/U neurological evaluation. May consider pending follow-up evaluations  May consider radiofrequency rhizolysis or intraspinal procedures pending response to present treatment and F/U evaluation   Patient to call [facility] should patient have concerns prior to scheduled return appointment. Selective Nerve Root Block Patient Information  Description: Specific nerve roots exit the spinal canal and these nerves can be compressed and inflamed by a bulging disc and bone spurs.  By injecting steroids on the nerve root, we can potentially decrease the inflammation surrounding these nerves, which often leads to decreased pain.  Also, by injecting local anesthesia on the nerve root, this can provide [place] helpful information to give to your referring doctor if it decreases your pain.  Selective nerve root blocks can be done along the spine from the neck to the low back depending on the location of your pain.   After numbing the skin with local anesthesia, a small needle is passed to the nerve root and the position of the needle is verified using x-ray pictures.  After the needle is in correct position, we then deposit the medication.  You may experience a pressure sensation while this is being done.  The entire block usually lasts less than 15 minutes.  Conditions that may be treated with selective nerve root blocks:  Low back and leg pain  Spinal stenosis  Diagnostic block prior to potential surgery  Neck and arm pain  Post laminectomy syndrome  Preparation  for the injection:  1. Do not eat any solid food or dairy products within 6 hours of your appointment. 2. You may drink clear liquids up to 2 hours before an appointment.  Clear liquids include water, black coffee, juice or soda.  No milk or cream please. 3. You may take your regular medications, including pain medications, with a sip of water before your appointment.  Diabetics should hold regular insulin (if taken separately) and take 1/2 normal NPH dose the morning of the procedure.  Carry some sugar containing items with you to your appointment. 4. A driver must accompany you and be prepared to drive you home after your procedure. 5. Bring all your current medications with you. 6. An IV may be inserted and sedation may be given at the discretion of the physician. 7. A blood pressure cuff, EKG, and other monitors will often be applied during the procedure.  Some patients may need to have extra oxygen administered for a short period. 8. You will be asked to provide medical information, including allergies, prior to the procedure.  We must know immediately if you are taking blood  Thinners (like Coumadin) or if you are allergic to IV iodine contrast (dye).  Possible side-effects: All are usually temporary  Bleeding from needle site  Light headedness  Numbness and tingling  Decreased blood pressure  Weakness in arms/legs  Pressure sensation in back/neck  Pain at injection site (several days)  Possible complications: All are extremely rare  Infection  Nerve injury  Spinal headache (a headache wore with upright position)  Call if you experience:  Fever/chills associated with  headache or increased back/neck pain  Headache worsened by an upright position  New onset weakness or numbness of an extremity below the injection site  Hives or difficulty breathing (go to the emergency room)  Inflammation or drainage at the injection site(s)  Severe back/neck pain greater than  usual  New symptoms which are concerning to you  Please note:  Although the local anesthetic injected can often make your back or neck feel good for several hours after the injection the pain will likely return.  It takes 3-5 days for steroids to work on the nerve root. You may not notice any pain relief for at least one week.  If effective, we will often do a series of 3 injections spaced 3-6 weeks apart to maximally decrease your pain.    If you have any questions, please call [phone] [facility] R[facility] AND COMPLICATIONS  What are the risk, side effects and possible complications? Generally speaking, most procedures are safe.  However, with any procedure there are risks, side effects, and the possibility of complications.  The risks and complications are dependent upon the sites that are lesioned, or the type of nerve block to be performed.  The closer the procedure is to the spine, the more serious the risks are.  Great care is taken when placing the radio frequency needles, block needles or lesioning probes, but sometimes complications can occur. 1. Infection: Any time there is an injection through the skin, there is a risk of infection.  This is why sterile conditions are used for these blocks.  There are four possible types of infection. 1. Localized skin infection. 2. Central Nervous System Infection-This can be in the form of Meningitis, which can be deadly. 3. Epidural Infections-This can be in the form of an epidural abscess, which can cause pressure inside of the spine, causing compression of the spinal cord with subsequent paralysis. This would require an emergency surgery to decompress, and there are no guarantees that the patient would recover from the paralysis. 4. Discitis-This is an infection of the intervertebral discs.  It occurs in about 1% of discography procedures.  It is difficult to treat and it may lead to surgery.         2. Pain: the needles have to go through skin and soft tissues, will cause soreness.       3. Damage to internal structures:  The nerves to be lesioned may be near blood vessels or    other nerves which can be potentially damaged.       4. Bleeding: Bleeding is more common if the patient is taking blood thinners such as  aspirin, Coumadin, Ticiid, Plavix, etc., or if he/she have some genetic predisposition  such as hemophilia. Bleeding into the spinal canal can cause compression of the spinal  cord with subsequent paralysis.  This would require an emergency surgery to  decompress and there are no guarantees that the patient would recover from the  paralysis.       5. Pneumothorax:  Puncturing of a lung is a possibility, every time a needle is introduced in  the area of the chest or upper back.  Pneumothorax refers to free air around the  collapsed lung(s), inside of the thoracic cavity (chest cavity).  Another two possible  complications related to a similar event would include: Hemothorax and Chylothorax.   These are variations of the Pneumothorax, where instead of air around the collapsed  lung(s), you may have blood  or chyle, respectively.       6. Spinal headaches: They may occur with any procedures in the area of the spine.       7. Persistent CSF (Cerebro-Spinal Fluid) leakage: This is a rare problem, but may occur  with prolonged intrathecal or epidural catheters either due to the formation of a fistulous  track or a dural tear.       8. Nerve damage: By working so close to the spinal cord, there is always a possibility of  nerve damage, which could be as serious as a permanent spinal cord injury with  paralysis.       9. Death:  Although rare, severe deadly allergic reactions known as "Anaphylactic  reaction" can occur to any of the medications used.      10. Worsening of the symptoms:  We can always make thing worse.  What are the chances of something like this happening? Chances of any of this  occuring are extremely low.  By statistics, you have more of a chance of getting killed in a motor vehicle accident: while driving to the hospital than any of the above occurring .  Nevertheless, you should be aware that they are possibilities.  In general, it is similar to taking a shower.  Everybody knows that you can slip, hit your head and get killed.  Does that mean that you should not shower again?  Nevertheless always keep in mind that statistics do not mean anything if you happen to be on the wrong side of them.  Even if a procedure has a 1 (one) in a 1,000,000 (million) chance of going wrong, it you happen to be that one..Also, keep in mind that by statistics, you have more of a chance of having something go wrong when taking medications.  Who should not have this procedure? If you are on a blood thinning medication (e.g. Coumadin, Plavix, see list of "Blood Thinners"), or if you have an active infection going on, you should not have the procedure.  If you are taking any blood thinners, please inform your physician.  How should I prepare for this procedure?  Do not eat or drink anything at least six hours prior to the procedure.  Bring a driver with you .  It cannot be a taxi.  Come accompanied by an adult that can drive you back, and that is strong enough to help you if your legs get weak or numb from the local anesthetic.  Take all of your medicines the morning of the procedure with just enough water to swallow them.  If you have diabetes, make sure that you are scheduled to have your procedure done first thing in the morning, whenever possible.  If you have diabetes, take only half of your insulin dose and notify our nurse that you have done so as soon as you arrive at the clinic.  If you are diabetic, but only take blood sugar pills (oral hypoglycemic), then do not take them on the morning of your procedure.  You may take them after you have had the procedure.  Do not take aspirin  or any aspirin-containing medications, at least eleven (11) days prior to the procedure.  They may prolong bleeding.  Wear loose fitting clothing that may be easy to take off and that you would not mind if it got stained with Betadine or blood.  Do not wear any jewelry or perfume  Remove any nail coloring.  It will interfere with some of  our monitoring equipment.  NOTE: Remember that this is not meant to be interpreted as a complete list of all possible complications.  Unforeseen problems may occur.  BLOOD THINNERS The following drugs contain aspirin or other products, which can cause increased bleeding during surgery and should not be taken for 2 weeks prior to and 1 week after surgery.  If you should need take something for relief of minor pain, you may take acetaminophen which is found in Tylenol,m Datril, Anacin-3 and Panadol. It is not blood thinner. The products listed below are.  Do not take any of the products listed below in addition to any listed on your instruction sheet.  A.P.C or A.P.C with Codeine Codeine Phosphate Capsules #3 Ibuprofen Ridaura  ABC compound Congesprin Imuran rimadil  Advil Cope Indocin Robaxisal  Alka-Seltzer Effervescent Pain Reliever and Antacid Coricidin or Coricidin-D  Indomethacin Rufen  Alka-Seltzer plus Cold Medicine Cosprin Ketoprofen S-A-C Tablets  Anacin Analgesic Tablets or Capsules Coumadin Korlgesic Salflex  Anacin Extra Strength Analgesic tablets or capsules CP-2 Tablets Lanoril Salicylate  Anaprox Cuprimine Capsules Levenox Salocol  Anexsia-D Dalteparin Magan Salsalate  Anodynos Darvon compound Magnesium Salicylate Sine-off  Ansaid Dasin Capsules Magsal Sodium Salicylate  Anturane Depen Capsules Marnal Soma  APF Arthritis pain formula Dewitt's Pills Measurin Stanback  Argesic Dia-Gesic Meclofenamic Sulfinpyrazone  Arthritis Bayer Timed Release Aspirin Diclofenac Meclomen Sulindac  Arthritis pain formula Anacin Dicumarol Medipren Supac   Analgesic (Safety coated) Arthralgen Diffunasal Mefanamic Suprofen  Arthritis Strength Bufferin Dihydrocodeine Mepro Compound Suprol  Arthropan liquid Dopirydamole Methcarbomol with Aspirin Synalgos  ASA tablets/Enseals Disalcid Micrainin Tagament  Ascriptin Doan's Midol Talwin  Ascriptin A/D Dolene Mobidin Tanderil  Ascriptin Extra Strength Dolobid Moblgesic Ticlid  Ascriptin with Codeine Doloprin or Doloprin with Codeine Momentum Tolectin  Asperbuf Duoprin Mono-gesic Trendar  Aspergum Duradyne Motrin or Motrin IB Triminicin  Aspirin plain, buffered or enteric coated Durasal Myochrisine Trigesic  Aspirin Suppositories Easprin Nalfon Trillsate  Aspirin with Codeine Ecotrin Regular or Extra Strength Naprosyn Uracel  Atromid-S Efficin Naproxen Ursinus  Auranofin Capsules Elmiron Neocylate Vanquish  Axotal Emagrin Norgesic Verin  Azathioprine Empirin or Empirin with Codeine Normiflo Vitamin E  Azolid Emprazil Nuprin Voltaren  Bayer Aspirin plain, buffered or children's or timed BC Tablets or powders Encaprin Orgaran Warfarin Sodium  Buff-a-Comp Enoxaparin Orudis Zorpin  Buff-a-Comp with Codeine Equegesic Os-Cal-Gesic   Buffaprin Excedrin plain, buffered or Extra Strength Oxalid   Bufferin Arthritis Strength Feldene Oxphenbutazone   Bufferin plain or Extra Strength Feldene Capsules Oxycodone with Aspirin   Bufferin with Codeine Fenoprofen Fenoprofen Pabalate or Pabalate-SF   Buffets II Flogesic Panagesic   Buffinol plain or Extra Strength Florinal or Florinal with Codeine Panwarfarin   Buf-Tabs Flurbiprofen Penicillamine   Butalbital Compound Four-way cold tablets Penicillin   Butazolidin Fragmin Pepto-Bismol   Carbenicillin Geminisyn Percodan   Carna Arthritis Reliever Geopen Persantine   Carprofen Gold's salt Persistin   Chloramphenicol Goody's Phenylbutazone   Chloromycetin Haltrain Piroxlcam   Clmetidine heparin Plaquenil   Cllnoril Hyco-pap Ponstel   Clofibrate Hydroxy  chloroquine Propoxyphen         Before stopping any of these medications, be sure to consult the physician who ordered them.  Some, such as Coumadin (Warfarin) are ordered to prevent or treat serious conditions such as "deep thrombosis", "pumonary embolisms", and other heart problems.  The amount of time that you may need off of the medication may also vary with the medication and the reason for which you were taking it.  If you  are taking any of these medications, please make sure you notify your pain physician before you undergo any procedures.

## 2015-11-09 ENCOUNTER — Telehealth: Payer: Self-pay | Admitting: Pain Medicine

## 2015-11-09 NOTE — Telephone Encounter (Signed)
[facility] called to say Volteran Gel needs prior authorization for cassel jarecki / [id number] Caryl Pina

## 2015-11-13 ENCOUNTER — Encounter: Payer: Self-pay | Admitting: Family Medicine

## 2015-11-13 ENCOUNTER — Ambulatory Visit (INDEPENDENT_AMBULATORY_CARE_PROVIDER_SITE_OTHER): Payer: Medicare Other | Admitting: Family Medicine

## 2015-11-13 VITALS — BP 142/76 | HR 75 | Temp 98.5°F | Resp 16 | Ht 67.0 in | Wt 136.0 lb

## 2015-11-13 DIAGNOSIS — E440 Moderate protein-calorie malnutrition: Secondary | ICD-10-CM

## 2015-11-13 DIAGNOSIS — M1990 Unspecified osteoarthritis, unspecified site: Secondary | ICD-10-CM

## 2015-11-13 DIAGNOSIS — G894 Chronic pain syndrome: Secondary | ICD-10-CM

## 2015-11-13 MED ORDER — OXYCODONE HCL 5 MG PO CAPS: ORAL_CAPSULE | ORAL | Status: DC

## 2015-11-13 MED ORDER — MELOXICAM 7.5 MG PO TABS
7.5000 mg | ORAL_TABLET | Freq: Every day | ORAL | Status: DC
Start: 2015-11-13 — End: 2016-01-15

## 2015-11-13 MED ORDER — COLCHICINE 0.6 MG PO TABS: 0.6000 mg | ORAL_TABLET | Freq: Every day | ORAL | Status: DC

## 2015-11-13 NOTE — Progress Notes (Signed)
Name: Zachary Brewer   MRN: [id number]    DOB: [birth year]   Date:11/13/2015       Progress Note  Subjective  Chief Complaint  Chief Complaint  Patient presents with  . Medication Refill    HPI Here to get refill of opoid until he sees Dr. Primus Bravo again in 8 days.  He reports that he is getting reduced dose of Prednisone, but he is unsure of all his meds really.  He is here alone today without his daughter.  Doesn't know about any of his meds.  He states that his hands are doing better and knees are sl. better.  Still stiff and sore but not swollen.  I talked with Dr. Primus Bravo and he stated that it was OK for mew to write him enough Opoids to last until patient appt. With him in 8 days and he would begoin writing pain meds at that time.  No problem-specific assessment & plan notes found for this encounter.   Past Medical History  Diagnosis Date  . Allergy   . GERD (gastroesophageal reflux disease)   . Hyperlipidemia   . Hypertension   . Arthritis 05/30/2015  . 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 Cervical pain 05/30/2015    Past Surgical History  Procedure Laterality Date  . Gastric outlet obstruction release  2013  . 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 Upper gi endoscopy    . Esophagogastroduodenoscopy (egd) with propofol N/A 06/20/2015    Procedure: ESOPHAGOGASTRODUODENOSCOPY (EGD) WITH PROPOFOL with dialation;  Surgeon: Lucilla Lame, MD;  Location: [place];  Service: Endoscopy;  Laterality: N/A;  . Flexible bronchoscopy N/A 06/22/2015    Procedure: FLEXIBLE BRONCHOSCOPY;  Surgeon: Allyne Gee, MD;  Location: [facility];  Service: Pulmonary;  Laterality: N/A;    Family History  Problem Relation Age of Onset  . Diabetes Mother   . Heart disease Father   . Diabetes Sister   . Diabetes Brother   . Stroke Brother   . Mental illness Brother   . Hypertension Paternal Grandfather     Social History   Social History  . Marital Status: Widowed    Spouse Name: N/A  . Number of  Children: N/A  . Years of Education: N/A   Occupational History  . Not on file.   Social History Main Topics  . Smoking status: Never Smoker   . Smokeless tobacco: Never Used  . Alcohol Use: No  . Drug Use: No  . Sexual Activity: Not on file   Other Topics Concern  . Not on file   Social History Narrative     Current outpatient prescriptions:  .  acetaminophen (TYLENOL) 325 MG tablet, Take 650 mg by mouth every 6 (six) hours as needed., Disp: , Rfl:  .  Calcium-Magnesium-Vitamin D (CALCIUM 500 PO), Take 500 mg by mouth 2 (two) times daily with a meal., Disp: , Rfl:  .  colchicine 0.6 MG tablet, Take 1 tablet (0.6 mg total) by mouth daily., Disp: 30 tablet, Rfl: 6 .  docusate sodium (COLACE) 100 MG capsule, Take 100 mg by mouth daily as needed for mild constipation., Disp: , Rfl:  .  furosemide (LASIX) 20 MG tablet, Take 1 tablet daily for 1 week, then 1 tablet every other day thereafter. (Patient taking differently: 20 mg every other day. Take 1 tablet daily for 1 week, then 1 tablet every other day thereafter.), Disp: 30 tablet, Rfl: 3 .  Multiple Vitamin (MULTIVITAMIN) tablet, Take 1 tablet by  mouth daily., Disp: , Rfl:  .  omeprazole (PRILOSEC) 20 MG capsule, Take 1 capsule (20 mg total) by mouth 2 (two) times daily before a meal., Disp: 60 capsule, Rfl: 12 .  oxycodone (OXY-IR) 5 MG capsule, Take 1 tablet every 6 hours as needed for pain, Disp: 32 capsule, Rfl: 0 .  predniSONE (DELTASONE) 20 MG tablet, Take 1 tablet (20 mg total) by mouth daily with breakfast., Disp: 30 tablet, Rfl: 0 .  predniSONE (DELTASONE) 5 MG tablet, Take 5 mg by mouth daily with breakfast., Disp: , Rfl:  .  tamsulosin (FLOMAX) 0.4 MG CAPS capsule, Take 1 capsule (0.4 mg total) by mouth daily after breakfast., Disp: 30 capsule, Rfl: 12 .  Vitamin D, Ergocalciferol, (DRISDOL) 50000 UNITS CAPS capsule, Take 50,000 Units by mouth once a week. Reported on 11/08/2015, Disp: , Rfl: 0 .  diclofenac sodium  (VOLTAREN) 1 % GEL, Apply 2-4 g to knee 4 times per day if tolerated. A maximum of only 4 g can be applied per application. Patient with history of strain, sprain, and muscle spasms (Patient not taking: Reported on 11/13/2015), Disp: 500 g, Rfl: 0 .  meloxicam (MOBIC) 7.5 MG tablet, Take 1 tablet (7.5 mg total) by mouth daily., Disp: 30 tablet, Rfl: 6  Not on File   Review of Systems  Constitutional: Negative for fever, chills, weight loss and malaise/fatigue.  HENT: Negative for hearing loss.   Eyes: Negative for blurred vision and double vision.  Respiratory: Negative for cough, shortness of breath and wheezing.   Cardiovascular: Negative for chest pain, palpitations and leg swelling.  Gastrointestinal: Negative for heartburn, abdominal pain and blood in stool.  Genitourinary: Negative for dysuria, urgency and frequency.  Musculoskeletal: Positive for joint pain (hands and knees).       He is walking better.  Skin: Negative for rash.  Neurological: Negative for dizziness, tremors, weakness and headaches.      Objective  Filed Vitals:   11/13/15 0813  BP: 142/76  Pulse: 75  Temp: 98.5 F (36.9 C)  TempSrc: Oral  Resp: 16  Height: 5\' 7"  (1.702 m)  Weight: 136 lb (61.689 kg)    Physical Exam  Constitutional: He is well-developed, well-nourished, and in no distress. No distress.  HENT:  Head: Normocephalic and atraumatic.  Eyes: Conjunctivae and EOM are normal. Pupils are equal, round, and reactive to light. No scleral icterus.  Neck: Normal range of motion. Neck supple. Carotid bruit is not present. No thyromegaly present.  Cardiovascular: Normal rate, regular rhythm and normal heart sounds.  Exam reveals no gallop and no friction rub.   No murmur heard. Pulmonary/Chest: Effort normal and breath sounds normal. No respiratory distress. He has no wheezes. He has no rales.  Abdominal: Soft. Bowel sounds are normal. He exhibits no distension, no abdominal bruit and no mass.  There is no tenderness.  Musculoskeletal: He exhibits no edema.  Some arthritic changes of bilateral hands/fingers and some changes in L knee.  Lymphadenopathy:    He has no cervical adenopathy.  Vitals reviewed.      Recent Results (from the past 2160 hour(s))  Basic Metabolic Panel (BMET)     Status: Abnormal   Collection Time: 08/30/15 11:39 AM  Result Value Ref Range   Glucose 130 (H) 65 - 99 mg/dL   BUN 12 8 - 27 mg/dL   Creatinine, Ser 0.52 (L) 0.76 - 1.27 mg/dL   GFR calc non Af Amer 104 >59 mL/min/1.73   GFR calc Af  Amer 120 >59 mL/min/1.73   BUN/Creatinine Ratio 23 (H) 10 - 22   Sodium 139 136 - 144 mmol/L   Potassium 4.3 3.5 - 5.2 mmol/L   Chloride 94 (L) 97 - 106 mmol/L   CO2 27 18 - 29 mmol/L   Calcium 8.4 (L) 8.6 - 10.2 mg/dL     Assessment & Plan  Problem List Items Addressed This Visit      Musculoskeletal and Integument   Arthritis - Primary   Relevant Medications   colchicine 0.6 MG tablet   oxycodone (OXY-IR) 5 MG capsule   meloxicam (MOBIC) 7.5 MG tablet     Other   Malnutrition of moderate degree (HCC)   Chronic pain syndrome   Relevant Medications   oxycodone (OXY-IR) 5 MG capsule      Meds ordered this encounter  Medications  . colchicine 0.6 MG tablet    Sig: Take 1 tablet (0.6 mg total) by mouth daily.    Dispense:  30 tablet    Refill:  6  . oxycodone (OXY-IR) 5 MG capsule    Sig: Take 1 tablet every 6 hours as needed for pain    Dispense:  32 capsule    Refill:  0  . meloxicam (MOBIC) 7.5 MG tablet    Sig: Take 1 tablet (7.5 mg total) by mouth daily.    Dispense:  30 tablet    Refill:  6   1. Arthritis -keep appointment with Rheumatology - colchicine 0.6 MG tablet; Take 1 tablet (0.6 mg total) by mouth daily.  Dispense: 30 tablet; Refill: 6 - oxycodone (OXY-IR) 5 MG capsule; Take 1 tablet every 6 hours as needed for pain  Dispense: 32 capsule; Refill: 0 - meloxicam (MOBIC) 7.5 MG tablet; Take 1 tablet (7.5 mg total) by mouth  daily.  Dispense: 30 tablet; Refill: 6  2. Chronic pain syndrome -Keep appointment with Pain [facility] an[facility] (Dr. Primus Bravo). - oxycodone (OXY-IR) 5 MG capsule; Take 1 tablet every 6 hours as needed for pain  Dispense: 32 capsule; Refill: 0 He is to get no more narcotics from this office.  Further opoids to be prescribed by Dr. Primus Bravo.  3. Malnutrition of moderate degree (HCC) Cont. Dietary suppl;ements

## 2015-11-14 ENCOUNTER — Telehealth: Payer: Self-pay | Admitting: Pain Medicine

## 2015-11-14 NOTE — Telephone Encounter (Signed)
PA request has been sent electronically

## 2015-11-14 NOTE — Telephone Encounter (Signed)
meds need prior auth / script given to [facility] / daughter called it is the cream that needs prior auth

## 2015-11-20 ENCOUNTER — Other Ambulatory Visit: Payer: Self-pay | Admitting: Pain Medicine

## 2015-11-21 ENCOUNTER — Ambulatory Visit: Payer: Medicare Other | Attending: Pain Medicine | Admitting: Pain Medicine

## 2015-11-21 ENCOUNTER — Encounter: Payer: Self-pay | Admitting: Pain Medicine

## 2015-11-21 VITALS — BP 125/66 | HR 60 | Temp 98.3°F | Resp 14 | Ht 67.0 in | Wt 128.0 lb

## 2015-11-21 DIAGNOSIS — M79606 Pain in leg, unspecified: Secondary | ICD-10-CM | POA: Diagnosis present

## 2015-11-21 DIAGNOSIS — M5136 Other intervertebral disc degeneration, lumbar region: Secondary | ICD-10-CM

## 2015-11-21 DIAGNOSIS — M1710 Unilateral primary osteoarthritis, unspecified knee: Secondary | ICD-10-CM | POA: Diagnosis not present

## 2015-11-21 DIAGNOSIS — M170 Bilateral primary osteoarthritis of knee: Secondary | ICD-10-CM

## 2015-11-21 DIAGNOSIS — M545 Low back pain: Secondary | ICD-10-CM | POA: Insufficient documentation

## 2015-11-21 DIAGNOSIS — M1000 Idiopathic gout, unspecified site: Secondary | ICD-10-CM

## 2015-11-21 DIAGNOSIS — M179 Osteoarthritis of knee, unspecified: Secondary | ICD-10-CM | POA: Diagnosis not present

## 2015-11-21 MED ORDER — OXYCODONE-ACETAMINOPHEN 5-325 MG PO TABS: ORAL_TABLET | ORAL | Status: DC

## 2015-11-21 MED ORDER — BUPIVACAINE HCL (PF) 0.25 % IJ SOLN
INTRAMUSCULAR | Status: AC
  Filled 2015-11-21: qty 30

## 2015-11-21 MED ORDER — TRIAMCINOLONE ACETONIDE 40 MG/ML IJ SUSP
INTRAMUSCULAR | Status: AC
Start: 2015-11-21 — End: 2015-11-21
  Administered 2015-11-21: 40 mg
  Filled 2015-11-21: qty 1

## 2015-11-21 MED ORDER — MIDAZOLAM HCL 5 MG/5ML IJ SOLN
INTRAMUSCULAR | Status: AC
Start: 2015-11-21 — End: 2015-11-21
  Administered 2015-11-21: 1 mg via INTRAVENOUS
  Filled 2015-11-21: qty 5

## 2015-11-21 MED ORDER — MIDAZOLAM HCL 5 MG/5ML IJ SOLN: 5.0000 mg | Freq: Once | INTRAMUSCULAR | Status: DC

## 2015-11-21 MED ORDER — FENTANYL CITRATE (PF) 100 MCG/2ML IJ SOLN: 100.0000 ug | Freq: Once | INTRAMUSCULAR | Status: DC

## 2015-11-21 MED ORDER — FENTANYL CITRATE (PF) 100 MCG/2ML IJ SOLN
INTRAMUSCULAR | Status: AC
  Administered 2015-11-21: 50 ug via INTRAVENOUS
  Filled 2015-11-21: qty 2

## 2015-11-21 MED ORDER — LACTATED RINGERS IV SOLN: 1000.0000 mL | INTRAVENOUS | Status: DC

## 2015-11-21 MED ORDER — LIDOCAINE HCL (PF) 1 % IJ SOLN
INTRAMUSCULAR | Status: DC
Start: 2015-11-21 — End: 2015-11-21
  Filled 2015-11-21: qty 10

## 2015-11-21 MED ORDER — TRIAMCINOLONE ACETONIDE 40 MG/ML IJ SUSP
40.0000 mg | Freq: Once | INTRAMUSCULAR | Status: DC
Start: 2015-11-21 — End: 2016-06-02

## 2015-11-21 MED ORDER — BUPIVACAINE HCL (PF) 0.25 % IJ SOLN: 30.0000 mL | Freq: Once | INTRAMUSCULAR | Status: DC

## 2015-11-21 MED ORDER — LIDOCAINE HCL (PF) 1 % IJ SOLN: 10.0000 mL | Freq: Once | INTRAMUSCULAR | Status: DC

## 2015-11-21 NOTE — Progress Notes (Signed)
Safety precautions to be maintained throughout the outpatient stay will include: orient to surroundings, keep bed in low position, maintain call bell within reach at all times, provide assistance with transfer out of bed and ambulation.  

## 2015-11-21 NOTE — Progress Notes (Signed)
Subjective:    Patient ID: Zachary Brewer, male    DOB: 1941/02/16, 75 y.o.   MRN: [id number]  HPI Geniculate nerve blocks of the left knee   The patient is a 75 y.o. male who returns [facility] the [facility] for further evaluation and treatment of pain involving the lumbar lower extremity region with severe pain of the left knee. Prior studies reveal patient to be with significant degenerative joint disease of the knee.  We will proceed with geniculate nerve blocks of the left knee in an attempt to decrease severity of symptoms, minimize the risk of medication escalation, hopefully retard progression of symptoms and avoid the need for more involved treatment.  The risks benefits and expectations of the procedure were discussed with the patient. The patient was with understanding and in agreement with suggested treatment plan.  DESCRIPTION OF PROCEDURE: Geniculate nerve blocks of the left knee. The  procedure was performed with IV Versed and IV fentanyl, conscious sedation and under fluoroscopic guidance.  NEEDLE PLACEMENT FOR BLOCK OF THE LATERAL SUPERIOR GENICULATE NERVE: The patient was taking to the fluoroscopy suite. With the patient supine, with knee in flexed position, Betadine prep of proposed entry site accomplished.  IV Versed, IV fentanyl conscious sedation, EKG, blood pressure, pulse and pulse oximetry monitoring were all in place. Under fluoroscopic guidance, a 22-gauge needle was inserted in the region of the left knee with needle placed at the lateral border of the femur at the junction of the shaft of the femur and the condyle of the femur.  Following needle placement at the lateral aspect of the knee, needle placement was then accomplished in the region of the medial aspect of the knee.  NEEDLE PLACEMENT FOR BLOCK OF THE MEDIAL SUPERIOR GENICULATE NERVE:  Under fluoroscopic guidance, a 22 - gauge needle was inserted in the region of the left knee with needle placed at the medial  border of the femur at the junction of the shaft of the femur and the condyle of the femur.   NEEDLE PLACEMENT FOR BLOCK OF THE MEDIAL INFERIOR GENICULATE NERVE:  Under fluoroscopic guidance, a 22 - gauge needle was inserted in the region of the left knee with needle placed at the junction of the shaft and plateau of the tibia.   Following needle placement on AP view of needles placed in all three locations, placement was then verified on lateral view with the tips of the superior lateral and superior medial needles documented to be one half the distance of the shaft of the femur and the tip of the inferior medial geniculate needle documented to be one half the distance of the shaft of the tibia.  Following documentation of needle placements on lateral view, each needle was injected with one mL of 0.25% bupivacaine with Kenalog. A total of 10 mg of Kenalog was utilized for the entire procedure. The patient tolerated the procedure well.    PLAN 1. Medications: Continue present medication oxycodone acetaminophen. Patient was cautioned regarding respiratory depression excessive sedation confusion and other side effects which can occur with this medication which patient stated he is taken for considerably long time without such side effects 2. Follow-up appointment with PCP Dr. Luan Pulling for evaluation of blood pressure and general medical condition. 3. Follow-up surgical evaluation. Has been addressed 4. Follow-up neurological evaluation. Will avoid at this time 5. He patient may be a candidate for radiofrequency rhizolysis and other treatment pending response to treatment on today's visit and follow-up evaluation.  6. The patient is advised to adhere to proper body mechanics and avoid activities which appear to aggravate condition    Review of Systems     Objective:   Physical Exam        Assessment & Plan:

## 2015-11-21 NOTE — Patient Instructions (Addendum)
Continue present medication oxycodone acetaminophen . Caution oxycodone acetaminophen can cause respiratory depression, excessive sedation, confusion, and other side effects. Exercise extreme caution when taking this medication   F/U PCP Dr. Luan Pulling for evaliation of  BP and general medical  condition  F/U surgical evaluation. May consider pending follow-up evaluations  F/U neurological evaluation. May consider pending follow-up evaluations  May consider radiofrequency rhizolysis or intraspinal procedures pending response to present treatment and F/U evaluation   Patient to call [facility] should patient have concerns prior to scheduled return appointment. GENERAL RISKS AND COMPLICATIONS  What are the risk, side effects and possible complications? Generally speaking, most procedures are safe.  However, with any procedure there are risks, side effects, and the possibility of complications.  The risks and complications are dependent upon the sites that are lesioned, or the type of nerve block to be performed.  The closer the procedure is to the spine, the more serious the risks are.  Great care is taken when placing the radio frequency needles, block needles or lesioning probes, but sometimes complications can occur. 1. Infection: Any time there is an injection through the skin, there is a risk of infection.  This is why sterile conditions are used for these blocks.  There are four possible types of infection. 1. Localized skin infection. 2. Central Nervous System Infection-This can be in the form of Meningitis, which can be deadly. 3. Epidural Infections-This can be in the form of an epidural abscess, which can cause pressure inside of the spine, causing compression of the spinal cord with subsequent paralysis. This would require an emergency surgery to decompress, and there are no guarantees that the patient would recover from the paralysis. 4. Discitis-This is an infection of the  intervertebral discs.  It occurs in about 1% of discography procedures.  It is difficult to treat and it may lead to surgery.        2. Pain: the needles have to go through skin and soft tissues, will cause soreness.       3. Damage to internal structures:  The nerves to be lesioned may be near blood vessels or    other nerves which can be potentially damaged.       4. Bleeding: Bleeding is more common if the patient is taking blood thinners such as  aspirin, Coumadin, Ticiid, Plavix, etc., or if he/she have some genetic predisposition  such as hemophilia. Bleeding into the spinal canal can cause compression of the spinal  cord with subsequent paralysis.  This would require an emergency surgery to  decompress and there are no guarantees that the patient would recover from the  paralysis.       5. Pneumothorax:  Puncturing of a lung is a possibility, every time a needle is introduced in  the area of the chest or upper back.  Pneumothorax refers to free air around the  collapsed lung(s), inside of the thoracic cavity (chest cavity).  Another two possible  complications related to a similar event would include: Hemothorax and Chylothorax.   These are variations of the Pneumothorax, where instead of air around the collapsed  lung(s), you may have blood or chyle, respectively.       6. Spinal headaches: They may occur with any procedures in the area of the spine.       7. Persistent CSF (Cerebro-Spinal Fluid) leakage: This is a rare problem, but may occur  with prolonged intrathecal or epidural catheters either due to the formation  of a fistulous  track or a dural tear.       8. Nerve damage: By working so close to the spinal cord, there is always a possibility of  nerve damage, which could be as serious as a permanent spinal cord injury with  paralysis.       9. Death:  Although rare, severe deadly allergic reactions known as "Anaphylactic  reaction" can occur to any of the medications used.       10. Worsening of the symptoms:  We can always make thing worse.  What are the chances of something like this happening? Chances of any of this occuring are extremely low.  By statistics, you have more of a chance of getting killed in a motor vehicle accident: while driving to the hospital than any of the above occurring .  Nevertheless, you should be aware that they are possibilities.  In general, it is similar to taking a shower.  Everybody knows that you can slip, hit your head and get killed.  Does that mean that you should not shower again?  Nevertheless always keep in mind that statistics do not mean anything if you happen to be on the wrong side of them.  Even if a procedure has a 1 (one) in a 1,000,000 (million) chance of going wrong, it you happen to be that one..Also, keep in mind that by statistics, you have more of a chance of having something go wrong when taking medications.  Who should not have this procedure? If you are on a blood thinning medication (e.g. Coumadin, Plavix, see list of "Blood Thinners"), or if you have an active infection going on, you should not have the procedure.  If you are taking any blood thinners, please inform your physician.  How should I prepare for this procedure?  Do not eat or drink anything at least six hours prior to the procedure.  Bring a driver with you .  It cannot be a taxi.  Come accompanied by an adult that can drive you back, and that is strong enough to help you if your legs get weak or numb from the local anesthetic.  Take all of your medicines the morning of the procedure with just enough water to swallow them.  If you have diabetes, make sure that you are scheduled to have your procedure done first thing in the morning, whenever possible.  If you have diabetes, take only half of your insulin dose and notify our nurse that you have done so as soon as you arrive at the clinic.  If you are diabetic, but only take blood sugar pills (oral  hypoglycemic), then do not take them on the morning of your procedure.  You may take them after you have had the procedure.  Do not take aspirin or any aspirin-containing medications, at least eleven (11) days prior to the procedure.  They may prolong bleeding.  Wear loose fitting clothing that may be easy to take off and that you would not mind if it got stained with Betadine or blood.  Do not wear any jewelry or perfume  Remove any nail coloring.  It will interfere with some of our monitoring equipment.  NOTE: Remember that this is not meant to be interpreted as a complete list of all possible complications.  Unforeseen problems may occur.  BLOOD THINNERS The following drugs contain aspirin or other products, which can cause increased bleeding during surgery and should not be taken for 2 weeks prior to and  1 week after surgery.  If you should need take something for relief of minor pain, you may take acetaminophen which is found in Tylenol,m Datril, Anacin-3 and Panadol. It is not blood thinner. The products listed below are.  Do not take any of the products listed below in addition to any listed on your instruction sheet.  A.P.C or A.P.C with Codeine Codeine Phosphate Capsules #3 Ibuprofen Ridaura  ABC compound Congesprin Imuran rimadil  Advil Cope Indocin Robaxisal  Alka-Seltzer Effervescent Pain Reliever and Antacid Coricidin or Coricidin-D  Indomethacin Rufen  Alka-Seltzer plus Cold Medicine Cosprin Ketoprofen S-A-C Tablets  Anacin Analgesic Tablets or Capsules Coumadin Korlgesic Salflex  Anacin Extra Strength Analgesic tablets or capsules CP-2 Tablets Lanoril Salicylate  Anaprox Cuprimine Capsules Levenox Salocol  Anexsia-D Dalteparin Magan Salsalate  Anodynos Darvon compound Magnesium Salicylate Sine-off  Ansaid Dasin Capsules Magsal Sodium Salicylate  Anturane Depen Capsules Marnal Soma  APF Arthritis pain formula Dewitt's Pills Measurin Stanback  Argesic Dia-Gesic Meclofenamic  Sulfinpyrazone  Arthritis Bayer Timed Release Aspirin Diclofenac Meclomen Sulindac  Arthritis pain formula Anacin Dicumarol Medipren Supac  Analgesic (Safety coated) Arthralgen Diffunasal Mefanamic Suprofen  Arthritis Strength Bufferin Dihydrocodeine Mepro Compound Suprol  Arthropan liquid Dopirydamole Methcarbomol with Aspirin Synalgos  ASA tablets/Enseals Disalcid Micrainin Tagament  Ascriptin Doan's Midol Talwin  Ascriptin A/D Dolene Mobidin Tanderil  Ascriptin Extra Strength Dolobid Moblgesic Ticlid  Ascriptin with Codeine Doloprin or Doloprin with Codeine Momentum Tolectin  Asperbuf Duoprin Mono-gesic Trendar  Aspergum Duradyne Motrin or Motrin IB Triminicin  Aspirin plain, buffered or enteric coated Durasal Myochrisine Trigesic  Aspirin Suppositories Easprin Nalfon Trillsate  Aspirin with Codeine Ecotrin Regular or Extra Strength Naprosyn Uracel  Atromid-S Efficin Naproxen Ursinus  Auranofin Capsules Elmiron Neocylate Vanquish  Axotal Emagrin Norgesic Verin  Azathioprine Empirin or Empirin with Codeine Normiflo Vitamin E  Azolid Emprazil Nuprin Voltaren  Bayer Aspirin plain, buffered or children's or timed BC Tablets or powders Encaprin Orgaran Warfarin Sodium  Buff-a-Comp Enoxaparin Orudis Zorpin  Buff-a-Comp with Codeine Equegesic Os-Cal-Gesic   Buffaprin Excedrin plain, buffered or Extra Strength Oxalid   Bufferin Arthritis Strength Feldene Oxphenbutazone   Bufferin plain or Extra Strength Feldene Capsules Oxycodone with Aspirin   Bufferin with Codeine Fenoprofen Fenoprofen Pabalate or Pabalate-SF   Buffets II Flogesic Panagesic   Buffinol plain or Extra Strength Florinal or Florinal with Codeine Panwarfarin   Buf-Tabs Flurbiprofen Penicillamine   Butalbital Compound Four-way cold tablets Penicillin   Butazolidin Fragmin Pepto-Bismol   Carbenicillin Geminisyn Percodan   Carna Arthritis Reliever Geopen Persantine   Carprofen Gold's salt Persistin   Chloramphenicol Goody's  Phenylbutazone   Chloromycetin Haltrain Piroxlcam   Clmetidine heparin Plaquenil   Cllnoril Hyco-pap Ponstel   Clofibrate Hydroxy chloroquine Propoxyphen         Before stopping any of these medications, be sure to consult the physician who ordered them.  Some, such as Coumadin (Warfarin) are ordered to prevent or treat serious conditions such as "deep thrombosis", "pumonary embolisms", and other heart problems.  The amount of time that you may need off of the medication may also vary with the medication and the reason for which you were taking it.  If you are taking any of these medications, please make sure you notify your pain physician before you undergo any procedures.

## 2015-11-22 ENCOUNTER — Telehealth: Payer: Self-pay

## 2015-11-22 NOTE — Telephone Encounter (Signed)
Procedure call back...left message

## 2015-11-26 ENCOUNTER — Ambulatory Visit: Payer: Medicare Other | Admitting: Family Medicine

## 2015-12-18 ENCOUNTER — Telehealth: Payer: Self-pay | Admitting: Family Medicine

## 2015-12-18 ENCOUNTER — Ambulatory Visit: Payer: Medicare Other | Attending: Pain Medicine | Admitting: Pain Medicine

## 2015-12-18 ENCOUNTER — Encounter: Payer: Self-pay | Admitting: Pain Medicine

## 2015-12-18 VITALS — BP 121/72 | HR 82 | Temp 98.5°F | Resp 16 | Ht 67.0 in | Wt 132.0 lb

## 2015-12-18 DIAGNOSIS — M5030 Other cervical disc degeneration, unspecified cervical region: Secondary | ICD-10-CM | POA: Diagnosis not present

## 2015-12-18 DIAGNOSIS — M19042 Primary osteoarthritis, left hand: Secondary | ICD-10-CM | POA: Insufficient documentation

## 2015-12-18 DIAGNOSIS — M109 Gout, unspecified: Secondary | ICD-10-CM | POA: Insufficient documentation

## 2015-12-18 DIAGNOSIS — M5134 Other intervertebral disc degeneration, thoracic region: Secondary | ICD-10-CM | POA: Diagnosis not present

## 2015-12-18 DIAGNOSIS — M1000 Idiopathic gout, unspecified site: Secondary | ICD-10-CM

## 2015-12-18 DIAGNOSIS — M5416 Radiculopathy, lumbar region: Secondary | ICD-10-CM | POA: Diagnosis not present

## 2015-12-18 DIAGNOSIS — M179 Osteoarthritis of knee, unspecified: Secondary | ICD-10-CM | POA: Diagnosis not present

## 2015-12-18 DIAGNOSIS — M5136 Other intervertebral disc degeneration, lumbar region: Secondary | ICD-10-CM | POA: Diagnosis not present

## 2015-12-18 DIAGNOSIS — M47817 Spondylosis without myelopathy or radiculopathy, lumbosacral region: Secondary | ICD-10-CM | POA: Diagnosis not present

## 2015-12-18 DIAGNOSIS — M1990 Unspecified osteoarthritis, unspecified site: Secondary | ICD-10-CM | POA: Diagnosis not present

## 2015-12-18 DIAGNOSIS — M170 Bilateral primary osteoarthritis of knee: Secondary | ICD-10-CM | POA: Diagnosis not present

## 2015-12-18 DIAGNOSIS — M545 Low back pain: Secondary | ICD-10-CM | POA: Diagnosis present

## 2015-12-18 DIAGNOSIS — G894 Chronic pain syndrome: Secondary | ICD-10-CM

## 2015-12-18 DIAGNOSIS — M461 Sacroiliitis, not elsewhere classified: Secondary | ICD-10-CM | POA: Diagnosis not present

## 2015-12-18 DIAGNOSIS — M19041 Primary osteoarthritis, right hand: Secondary | ICD-10-CM | POA: Insufficient documentation

## 2015-12-18 DIAGNOSIS — M79606 Pain in leg, unspecified: Secondary | ICD-10-CM | POA: Diagnosis present

## 2015-12-18 MED ORDER — OXYCODONE-ACETAMINOPHEN 5-325 MG PO TABS: ORAL_TABLET | ORAL | Status: DC

## 2015-12-18 NOTE — Telephone Encounter (Signed)
Daughter is going to call back with list of meds. He is taking lasix 3 times a day now. She wants to know if he needs to still come Monday also once you look over meds. I will not be here so please instruct another CMA.

## 2015-12-18 NOTE — Progress Notes (Signed)
Subjective:    Patient ID: Zachary Brewer, male    DOB: 18-Apr-1941, 75 y.o.   MRN: [id number]  HPI  The patient is a 75 year old male who returns to pain management for further evaluation and treatment of pain involving the lower back lower extremity region. The patient had severe pain involving the region of the left knee. The patient underwent geniculate nerve blocks of the left knee at time of previous visit to pain management Center. The patient returns today and states that he is doing remarkably well. The patient states that he has been able to improve activities of daily living and that he is not using his cane for ambulation. The patient is accompanied by daughter who agrees the patient has had a remarkable improvement of his abilities to function and to perform activities on his feet. We discussed patient's condition and will continue present medications and will consider patient for additional treatment modification pending follow-up evaluation. The patient denies any trauma change in events of daily living of any significant degree. The patient states that the left knee feels great. We will consider patient for geniculate nerve blocks as well as other procedures pending response to treatment and follow-up evaluation. The patient was in agreement with suggested treatment plan. The patient will continue oxycodone as discussed as well as Mobile we have cautioned regarding mobility. The patient is with minimal use of Mobile with Mobidin being used approximately once or twice per week. The patient was unable to have full pterin gel approved by insurance     Review of Systems     Objective:   Physical Exam There was tenderness of the splenius capitis and occipitalis muscles regions palpation which reproduces minimal discomfort with minimal tenderness over the cervical facet cervical paraspinal musculature regions. Palpation over the acromioclavicular and glenohumeral joint regions reproduces  minimal discomfort as well. The patient appeared to be with bilaterally equal grip strength and Tinel and Phalen's maneuver were without increase of pain of significant degree. His over the thoracic facet thoracic paraspinal muscular treat was with minimal discomfort with no crepitus of the thoracic region noted. Palpation over the lumbar paraspinal muscular region lumbar facet region was attends to palpation of minimal degree with lateral bending rotation extension and palpation of the lumbar facets reproducing minimal discomfort as well. The patient was with straight leg raising tolerated to 30 without increase of pain with dorsiflexion noted. There was negative clonus negative Homans. The knee was without any increased warmth erythema. There was minimal tenderness to palpation of the left knee with crepitus of the left knee noted. There was negative anterior and posterior drawer signs with no ballottement of the patella noted. EHL strength appeared to be slightly decreased. No definite sensory deficit of lower extremity noted. Palpation of the lumbar facet lumbar paraspinal must reason reproduced minimal discomfort with lateral bending rotation extension and palpation of the lumbar facets reproducing minimal discomfort. There was minimal tenderness of the PSIS and PII S region as well as. With tenderness of the greater trochanteric region and iliotibial band region. There was negative clonus negative Homans. Abdomen was nontender with no costovertebral tenderness noted.       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 Caution Mobic can cause bleeding ulcers of the stomach and  GI tract as well as liver and kidney damage. Continue Voltaren Gel applications to knee for now . We will consider  discontinuing we'll pterin gel as discussed  F/U PCP Dr. Luan Pulling for evaliation of  BP and general medical  condition. The patient will follow-up with Dr. Luan Pulling later today as discussed.. The patient will discuss medication adjustments and other issues with Dr. Luan Pulling   F/U surgical evaluation. May consider pending follow-up evaluations  F/U neurological evaluation. May consider pending follow-up evaluations  May consider radiofrequency rhizolysis or intraspinal procedures pending response to present treatment and F/U evaluation   Patient to call [facility] Center should patient have concerns prior to scheduled return appointment.

## 2015-12-18 NOTE — Telephone Encounter (Signed)
Pt's BP has been running high but has no headaches.  Dr. Luan Pulling had taken him off his BP medication and does have some left   He did schedule an appt for Monday afternoon to see Dr. Luan Pulling for this but would like a call back if he should start taking meds before.  His call back is [phone] and you can leave a message

## 2015-12-18 NOTE — Progress Notes (Signed)
Safety precautions to be maintained throughout the outpatient stay will include: orient to surroundings, keep bed in low position, maintain call bell within reach at all times, provide assistance with transfer out of bed and ambulation.  

## 2015-12-18 NOTE — Telephone Encounter (Signed)
What is the medicine and the strength that he has at home?-jh

## 2015-12-18 NOTE — Telephone Encounter (Signed)
He could take that , 1 a day until he sees me next week.-jh

## 2015-12-18 NOTE — Telephone Encounter (Signed)
Will await call-jh

## 2015-12-18 NOTE — Telephone Encounter (Signed)
Please review and let me know if we need to add bp meds. Avg bp 168/95 sometimes higher since stopping bp meds.

## 2015-12-18 NOTE — Patient Instructions (Addendum)
Continue present medication oxycodone and Mobic for now. Caution Mobic can cause bleeding ulcers of the stomach and GI tract as well as liver and kidney damage. Continue Voltaren Gel applications to knee for now  F/U PCP Dr. Luan Pulling for evaliation of  BP and general medical  condition  F/U surgical evaluation. May consider pending follow-up evaluations  F/U neurological evaluation. May consider pending follow-up evaluations  May consider radiofrequency rhizolysis or intraspinal procedures pending response to present treatment and F/U evaluation   Patient to call [facility] should patient have concerns prior to scheduled return appointment.

## 2015-12-18 NOTE — Telephone Encounter (Signed)
Pt  daughter called back states that the medication  BP that the pt was taken was enalapril  20 mg   1x a day daughter call back # is [phone]

## 2015-12-19 NOTE — Telephone Encounter (Signed)
Advised.JH  

## 2015-12-24 ENCOUNTER — Encounter: Payer: Self-pay | Admitting: Family Medicine

## 2015-12-24 ENCOUNTER — Ambulatory Visit (INDEPENDENT_AMBULATORY_CARE_PROVIDER_SITE_OTHER): Payer: Medicare Other | Admitting: Family Medicine

## 2015-12-24 VITALS — BP 145/70 | HR 97 | Resp 16 | Ht 67.0 in | Wt 140.0 lb

## 2015-12-24 DIAGNOSIS — M1990 Unspecified osteoarthritis, unspecified site: Secondary | ICD-10-CM

## 2015-12-24 DIAGNOSIS — N400 Enlarged prostate without lower urinary tract symptoms: Secondary | ICD-10-CM

## 2015-12-24 DIAGNOSIS — I10 Essential (primary) hypertension: Secondary | ICD-10-CM | POA: Diagnosis not present

## 2015-12-24 DIAGNOSIS — G894 Chronic pain syndrome: Secondary | ICD-10-CM | POA: Diagnosis not present

## 2015-12-24 MED ORDER — ENALAPRIL MALEATE 20 MG PO TABS: ORAL_TABLET | ORAL | Status: DC

## 2015-12-24 NOTE — Patient Instructions (Signed)
Wear support socks for swelling.

## 2015-12-24 NOTE — Progress Notes (Signed)
Name: Zachary Brewer   MRN: [id number]    DOB: Aug 22, 1941   Date:12/24/2015       Progress Note  Subjective  Chief Complaint  Chief Complaint  Patient presents with  . Hypertension    Elevated BS since being off the BP meds called and asked to have it added back. Has done well since. 130-170/84-90 PRIOR TO ADDED MEDS NOW 120-124/75-82.    HPI Here to f/u HBP.  Has chronic pain and sees Pain management.  Ov erall pain is getting somewhat better.  c/o swelling in L leg/ankle.  No problem-specific assessment & plan notes found for this encounter.   Past Medical History  Diagnosis Date  . Allergy   . GERD (gastroesophageal reflux disease)   . Hyperlipidemia   . Hypertension   . Arthritis 05/30/2015  . 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 Cervical pain 05/30/2015    Past Surgical History  Procedure Laterality Date  . Gastric outlet obstruction release  2013  . 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 Upper gi endoscopy    . Esophagogastroduodenoscopy (egd) with propofol N/A 06/20/2015    Procedure: ESOPHAGOGASTRODUODENOSCOPY (EGD) WITH PROPOFOL with dialation;  Surgeon: Lucilla Lame, MD;  Location: [facility];  Service: Endoscopy;  Laterality: N/A;  . Flexible bronchoscopy N/A 06/22/2015    Procedure: FLEXIBLE BRONCHOSCOPY;  Surgeon: Allyne Gee, MD;  Location: [facility];  Service: Pulmonary;  Laterality: N/A;    Family History  Problem Relation Age of Onset  . Diabetes Mother   . Heart disease Father   . Diabetes Sister   . Diabetes Brother   . Stroke Brother   . Mental illness Brother   . Hypertension Paternal Grandfather     Social History   Social History  . Marital Status: Widowed    Spouse Name: N/A  . Number of Children: N/A  . Years of Education: N/A   Occupational History  . Not on file.   Social History Main Topics  . Smoking status: Never Smoker   . Smokeless tobacco: Never Used  . Alcohol Use: No  . Drug Use: No  . Sexual Activity: Not on file   Other  Topics Concern  . Not on file   Social History Narrative     Current outpatient prescriptions:  .  acetaminophen (TYLENOL) 325 MG tablet, Take 650 mg by mouth every 6 (six) hours as needed., Disp: , Rfl:  .  Calcium 500-100 MG-UNIT CHEW, CHEW 1 TABLET BY MOUTH TWICE DAILY WITH A MEAL, Disp: , Rfl: 11 .  docusate sodium (COLACE) 100 MG capsule, Take 100 mg by mouth daily as needed for mild constipation. Reported on 12/24/2015, Disp: , Rfl:  .  enalapril (VASOTEC) 20 MG tablet, Take 1.5 tablets each AM for BP., Disp: 135 tablet, Rfl: 3 .  furosemide (LASIX) 20 MG tablet, Take 1 tablet daily for 1 week, then 1 tablet every other day thereafter., Disp: 30 tablet, Rfl: 3 .  Multiple Vitamin (MULTIVITAMIN) tablet, Take 1 tablet by mouth daily. Reported on 11/21/2015, Disp: , Rfl:  .  omeprazole (PRILOSEC) 20 MG capsule, Take 1 capsule (20 mg total) by mouth 2 (two) times daily before a meal., Disp: 60 capsule, Rfl: 12 .  tamsulosin (FLOMAX) 0.4 MG CAPS capsule, Take 1 capsule (0.4 mg total) by mouth daily after breakfast., Disp: 30 capsule, Rfl: 12 .  Vitamin D, Ergocalciferol, (DRISDOL) 50000 UNITS CAPS capsule, Take 50,000 Units by mouth once a week. Reported on 11/08/2015, Disp: ,  Rfl: 0 .  atorvastatin (LIPITOR) 10 MG tablet, Take 10 mg by mouth daily. Reported on 12/24/2015, Disp: , Rfl: 0 .  colchicine 0.6 MG tablet, Take 1 tablet (0.6 mg total) by mouth daily. (Patient not taking: Reported on 12/24/2015), Disp: 30 tablet, Rfl: 6 .  diclofenac sodium (VOLTAREN) 1 % GEL, Apply 2-4 g to knee 4 times per day if tolerated. A maximum of only 4 g can be applied per application. Patient with history of strain, sprain, and muscle spasms (Patient not taking: Reported on 12/24/2015), Disp: 500 g, Rfl: 0 .  meloxicam (MOBIC) 7.5 MG tablet, Take 1 tablet (7.5 mg total) by mouth daily. (Patient not taking: Reported on 12/24/2015), Disp: 30 tablet, Rfl: 6 .  oxyCODONE (OXY IR/ROXICODONE) 5 MG immediate release  tablet, Take 5 mg by mouth every 6 (six) hours as needed. Reported on 12/24/2015, Disp: , Rfl: 0 .  oxyCODONE-acetaminophen (ROXICET) 5-325 MG tablet, Limit one half to one tab by mouth per day or 2-3 times per day if tolerated (Patient not taking: Reported on 12/24/2015), Disp: 80 tablet, Rfl: 0  Current facility-administered medications:  .  fentaNYL (SUBLIMAZE) injection 100 mcg, 100 mcg, Intravenous, Once, Mohammed Kindle, MD .  lactated ringers infusion 1,000 mL, 1,000 mL, Intravenous, Continuous, Mohammed Kindle, MD .  lidocaine (PF) (XYLOCAINE) 1 % injection 10 mL, 10 mL, Subcutaneous, Once, Mohammed Kindle, MD .  midazolam (VERSED) 5 MG/5ML injection 5 mg, 5 mg, Intravenous, Once, Mohammed Kindle, MD .  triamcinolone acetonide (KENALOG-40) injection 40 mg, 40 mg, Other, Once, Mohammed Kindle, MD  Not on File   Review of Systems  Constitutional: Negative for fever, chills, weight loss and malaise/fatigue.  HENT: Negative for hearing loss.   Eyes: Negative for blurred vision and double vision.  Respiratory: Negative for cough, shortness of breath and wheezing.   Cardiovascular: Negative for chest pain, palpitations and leg swelling.  Gastrointestinal: Negative for heartburn, abdominal pain and blood in stool.  Genitourinary: Negative for dysuria, urgency and frequency.  Musculoskeletal: Positive for myalgias and joint pain.  Skin: Negative for rash.  Neurological: Negative for weakness and headaches.      Objective  Filed Vitals:   12/24/15 1447 12/24/15 1527  BP: 109/71 145/70  Pulse: 97   Resp: 16   Height: 5\' 7"  (1.702 m)   Weight: 140 lb (63.504 kg)     Physical Exam  Constitutional: He is oriented to person, place, and time and well-developed, well-nourished, and in no distress. No distress.  HENT:  Head: Normocephalic and atraumatic.  Eyes: Conjunctivae and EOM are normal. Pupils are equal, round, and reactive to light. No scleral icterus.  Neck: Normal range of motion.  Neck supple. Carotid bruit is not present. No thyromegaly present.  Cardiovascular: Normal rate, regular rhythm and normal heart sounds.  Exam reveals no gallop and no friction rub.   No murmur heard. Pulmonary/Chest: Effort normal and breath sounds normal. No respiratory distress. He has no wheezes. He has no rales.  Musculoskeletal: He exhibits edema (Trace edema of L lower leg).  Lymphadenopathy:    He has no cervical adenopathy.  Neurological: He is alert and oriented to person, place, and time.  Vitals reviewed.      No results found for this or any previous visit (from the past 2160 hour(s)).   Assessment & Plan  Problem List Items Addressed This Visit      Cardiovascular and Mediastinum   Essential (primary) hypertension - Primary   Relevant Medications  atorvastatin (LIPITOR) 10 MG tablet   enalapril (VASOTEC) 20 MG tablet     Musculoskeletal and Integument   Arthritis   Relevant Medications   oxyCODONE (OXY IR/ROXICODONE) 5 MG immediate release tablet     Genitourinary   BPH (benign prostatic hyperplasia)     Other   Chronic pain syndrome      Meds ordered this encounter  Medications  . DISCONTD: calcium carbonate (OS-CAL - DOSED IN MG OF ELEMENTAL CALCIUM) 1250 (500 Ca) MG tablet    Sig: Take by mouth.  Marland Kitchen atorvastatin (LIPITOR) 10 MG tablet    Sig: Take 10 mg by mouth daily. Reported on 12/24/2015    Refill:  0  . Calcium 500-100 MG-UNIT CHEW    Sig: CHEW 1 TABLET BY MOUTH TWICE DAILY WITH A MEAL    Refill:  11  . oxyCODONE (OXY IR/ROXICODONE) 5 MG immediate release tablet    Sig: Take 5 mg by mouth every 6 (six) hours as needed. Reported on 12/24/2015    Refill:  0  . DISCONTD: ENALAPRIL MALEATE PO    Sig: Take 20 mg by mouth. Take 1.5 tablets each AM for BP.  . enalapril (VASOTEC) 20 MG tablet    Sig: Take 1.5 tablets each AM for BP.    Dispense:  135 tablet    Refill:  3   1. Essential (primary) hypertension  - enalapril (VASOTEC) 20 MG  tablet; Take 1.5 tablets each AM for BP.  Dispense: 135 tablet; Refill: 3  2. Arthritis   3. BPH (benign prostatic hyperplasia) Cont. Tamsulosin  4. Chronic pain syndrome

## 2015-12-25 ENCOUNTER — Ambulatory Visit: Payer: Medicare Other | Admitting: Family Medicine

## 2016-01-15 ENCOUNTER — Encounter: Payer: Self-pay | Admitting: Pain Medicine

## 2016-01-15 ENCOUNTER — Ambulatory Visit: Payer: Medicare Other | Attending: Pain Medicine | Admitting: Pain Medicine

## 2016-01-15 VITALS — BP 106/75 | HR 76 | Temp 99.0°F | Resp 18 | Ht 67.0 in | Wt 135.0 lb

## 2016-01-15 DIAGNOSIS — M19011 Primary osteoarthritis, right shoulder: Secondary | ICD-10-CM | POA: Diagnosis not present

## 2016-01-15 DIAGNOSIS — M25562 Pain in left knee: Secondary | ICD-10-CM | POA: Insufficient documentation

## 2016-01-15 DIAGNOSIS — M19042 Primary osteoarthritis, left hand: Secondary | ICD-10-CM | POA: Diagnosis not present

## 2016-01-15 DIAGNOSIS — M25561 Pain in right knee: Secondary | ICD-10-CM | POA: Diagnosis present

## 2016-01-15 DIAGNOSIS — M5030 Other cervical disc degeneration, unspecified cervical region: Secondary | ICD-10-CM | POA: Diagnosis not present

## 2016-01-15 DIAGNOSIS — M47817 Spondylosis without myelopathy or radiculopathy, lumbosacral region: Secondary | ICD-10-CM | POA: Diagnosis not present

## 2016-01-15 DIAGNOSIS — M5416 Radiculopathy, lumbar region: Secondary | ICD-10-CM | POA: Diagnosis not present

## 2016-01-15 DIAGNOSIS — M1990 Unspecified osteoarthritis, unspecified site: Secondary | ICD-10-CM | POA: Diagnosis not present

## 2016-01-15 DIAGNOSIS — M170 Bilateral primary osteoarthritis of knee: Secondary | ICD-10-CM | POA: Diagnosis not present

## 2016-01-15 DIAGNOSIS — M5136 Other intervertebral disc degeneration, lumbar region: Secondary | ICD-10-CM | POA: Insufficient documentation

## 2016-01-15 DIAGNOSIS — M1000 Idiopathic gout, unspecified site: Secondary | ICD-10-CM

## 2016-01-15 DIAGNOSIS — G894 Chronic pain syndrome: Secondary | ICD-10-CM

## 2016-01-15 DIAGNOSIS — M461 Sacroiliitis, not elsewhere classified: Secondary | ICD-10-CM | POA: Diagnosis not present

## 2016-01-15 DIAGNOSIS — M19041 Primary osteoarthritis, right hand: Secondary | ICD-10-CM | POA: Insufficient documentation

## 2016-01-15 DIAGNOSIS — M19012 Primary osteoarthritis, left shoulder: Secondary | ICD-10-CM | POA: Insufficient documentation

## 2016-01-15 DIAGNOSIS — M79606 Pain in leg, unspecified: Secondary | ICD-10-CM | POA: Diagnosis present

## 2016-01-15 DIAGNOSIS — M5134 Other intervertebral disc degeneration, thoracic region: Secondary | ICD-10-CM | POA: Diagnosis not present

## 2016-01-15 DIAGNOSIS — M109 Gout, unspecified: Secondary | ICD-10-CM | POA: Insufficient documentation

## 2016-01-15 DIAGNOSIS — M179 Osteoarthritis of knee, unspecified: Secondary | ICD-10-CM | POA: Diagnosis not present

## 2016-01-15 DIAGNOSIS — M545 Low back pain: Secondary | ICD-10-CM | POA: Diagnosis present

## 2016-01-15 MED ORDER — OXYCODONE-ACETAMINOPHEN 5-325 MG PO TABS: ORAL_TABLET | ORAL | Status: DC

## 2016-01-15 NOTE — Progress Notes (Signed)
Subjective:    Patient ID: Zachary Brewer, male    DOB: June 06, 1941, 75 y.o.   MRN: [id number]  HPI  The patient is a 75 year old gentleman who returns to pain management for further evaluation and treatment of pain involving the lower back lower extremity region especially the left knee. The patient is status post geniculate nerve blocks of the knee and continues to have significant relief of pain involving the left knee. The patient also has decreased Mobic significantly after we discussed the side effects of the medication with the patient. The patient also states that the rheumatologist is in agreement with decreasing patient's Mobic as well. We will continue oxycodone as prescribed At this time and patient will call pain management should they be significant change in condition prior to scheduled return appointment. The patient is able to walk without assistance of cane or any other assistive device and is doing very well at this time. We've discussed radiofrequency of the geniculate nerve was. At the present time we will avoid considering such treatment. The patient has done remarkably well following geniculate nerve blocks of the knee. We will consider repeating geniculate nerve blocks of the knee should there be return of significant pain and we will continue oxycodone at the present dose. The patient continues to do extremely well at this time but the present treatment regimen. All agreed to suggested treatment plan.     Review of Systems     Objective:   Physical Exam  There was tenderness to palpation of the splenius capitis and occipitalis musculature regions. Palpation of these regions reproduces minimal discomfort. There was minimal tenderness of the acromioclavicular and glenohumeral joint region. The patient was with bilaterally equal grip strength and Tinel and Phalen's maneuver were without increase of pain of significant degree. There was minimal tenderness over the thoracic facet  thoracic paraspinal musculature region and no crepitus of the thoracic region was noted. Palpation over the lumbar paraspinal muscular treat and lumbar facet region was with minimal discomfort as well lateral bending rotation extension and palpation of the lumbar facets reproduced minimal discomfort. Straight leg raising was tolerated to 30 without increase of pain with dorsiflexion noted. EHL strength appeared to be equal and no sensory deficit or dermatomal distribution was detected. The knee was with minimal tenderness to palpation with crepitus of the knee noted. There was negative anterior and posterior drawer signs without ballottement of the patella of the left knee. No increased warmth erythema of the knees noted. There was minimal tenderness of the PSIS and PII S regions and no significant tenderness of the greater trochanteric region iliotibial band region. There was negative clonus negative Homans. 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 Caution Mobic can cause bleeding ulcers of the stomach and GI tract as well as liver and kidney damage as we discussed again today  You may continue Voltaren Gel applications to knee   F/U PCP Dr. Luan Pulling for evaliation of  BP and general medical  Condition  F/U with rheumatologist as discussed   F/U  surgical evaluation. We will avoid surgical evaluation at this time  F/U neurological evaluation. May consider pending follow-up evaluations. We will avoid further studies at  this time  May consider radiofrequency rhizolysis or intraspinal procedures pending response to present treatment and F/U evaluation  . We will avoid such treatment at this time  Patient to call  [facility] Management Center should patient have concerns prior to scheduled return appointment.

## 2016-01-15 NOTE — Progress Notes (Signed)
Safety precautions to be maintained throughout the outpatient stay will include: orient to surroundings, keep bed in low position, maintain call bell within reach at all times, provide assistance with transfer out of bed and ambulation.  

## 2016-01-15 NOTE — Patient Instructions (Addendum)
PLAN   Continue present medication oxycodone acetaminophen and Mobic for now. Caution Mobic can cause bleeding ulcers of the stomach and GI tract as well as liver and kidney damage as we discussed again today  You may continue Voltaren Gel applications to knee   F/U PCP Dr. Luan Pulling for evaliation of  BP and general medical  Condition  F/U with rheumatologist as discussed   F/U  surgical evaluation. We will avoid surgical evaluation at this time  F/U neurological evaluation. May consider pending follow-up evaluations. We will avoid further studies at this time  May consider radiofrequency rhizolysis or intraspinal procedures pending response to present treatment and F/U evaluation  . We will avoid such treatment at this time  Patient to call Pain Management Center should patient have concerns prior to scheduled return appointment.

## 2016-01-23 DIAGNOSIS — Z79899 Other long term (current) drug therapy: Secondary | ICD-10-CM | POA: Diagnosis not present

## 2016-01-23 DIAGNOSIS — M1180 Other specified crystal arthropathies, unspecified site: Secondary | ICD-10-CM | POA: Diagnosis not present

## 2016-01-23 DIAGNOSIS — Z7952 Long term (current) use of systemic steroids: Secondary | ICD-10-CM | POA: Diagnosis not present

## 2016-02-14 ENCOUNTER — Encounter: Payer: Self-pay | Admitting: Pain Medicine

## 2016-02-14 ENCOUNTER — Ambulatory Visit: Payer: Medicare Other | Attending: Pain Medicine | Admitting: Pain Medicine

## 2016-02-14 VITALS — BP 121/77 | HR 79 | Temp 98.6°F | Resp 18 | Ht 67.0 in | Wt 136.0 lb

## 2016-02-14 DIAGNOSIS — M19042 Primary osteoarthritis, left hand: Secondary | ICD-10-CM | POA: Diagnosis not present

## 2016-02-14 DIAGNOSIS — M47817 Spondylosis without myelopathy or radiculopathy, lumbosacral region: Secondary | ICD-10-CM | POA: Diagnosis not present

## 2016-02-14 DIAGNOSIS — M5136 Other intervertebral disc degeneration, lumbar region: Secondary | ICD-10-CM

## 2016-02-14 DIAGNOSIS — G894 Chronic pain syndrome: Secondary | ICD-10-CM | POA: Diagnosis not present

## 2016-02-14 DIAGNOSIS — M5030 Other cervical disc degeneration, unspecified cervical region: Secondary | ICD-10-CM | POA: Insufficient documentation

## 2016-02-14 DIAGNOSIS — M1000 Idiopathic gout, unspecified site: Secondary | ICD-10-CM | POA: Diagnosis not present

## 2016-02-14 DIAGNOSIS — M109 Gout, unspecified: Secondary | ICD-10-CM | POA: Diagnosis not present

## 2016-02-14 DIAGNOSIS — M19041 Primary osteoarthritis, right hand: Secondary | ICD-10-CM | POA: Insufficient documentation

## 2016-02-14 DIAGNOSIS — M170 Bilateral primary osteoarthritis of knee: Secondary | ICD-10-CM | POA: Diagnosis not present

## 2016-02-14 DIAGNOSIS — M25569 Pain in unspecified knee: Secondary | ICD-10-CM | POA: Diagnosis present

## 2016-02-14 DIAGNOSIS — M5134 Other intervertebral disc degeneration, thoracic region: Secondary | ICD-10-CM | POA: Insufficient documentation

## 2016-02-14 DIAGNOSIS — M461 Sacroiliitis, not elsewhere classified: Secondary | ICD-10-CM | POA: Diagnosis not present

## 2016-02-14 DIAGNOSIS — M1990 Unspecified osteoarthritis, unspecified site: Secondary | ICD-10-CM

## 2016-02-14 DIAGNOSIS — M179 Osteoarthritis of knee, unspecified: Secondary | ICD-10-CM | POA: Diagnosis not present

## 2016-02-14 DIAGNOSIS — M19011 Primary osteoarthritis, right shoulder: Secondary | ICD-10-CM | POA: Insufficient documentation

## 2016-02-14 DIAGNOSIS — M19012 Primary osteoarthritis, left shoulder: Secondary | ICD-10-CM | POA: Insufficient documentation

## 2016-02-14 DIAGNOSIS — M5416 Radiculopathy, lumbar region: Secondary | ICD-10-CM | POA: Diagnosis not present

## 2016-02-14 MED ORDER — OXYCODONE-ACETAMINOPHEN 5-325 MG PO TABS: ORAL_TABLET | ORAL | Status: DC

## 2016-02-14 NOTE — Progress Notes (Signed)
Safety precautions to be maintained throughout the outpatient stay will include: orient to surroundings, keep bed in low position, maintain call bell within reach at all times, provide assistance with transfer out of bed and ambulation.  

## 2016-02-14 NOTE — Patient Instructions (Addendum)
PLAN   Continue present medication oxycodone acetaminophen and Mobic for now. Caution Mobic can cause bleeding ulcers of the stomach and GI tract as well as liver and kidney damage as we discussed again today  You may continue Voltaren Gel applications to knee   We will consider geniculate nerve blocks of the knee if pain returns as we discussed  F/U PCP Dr. Luan Pulling for evaliation of  BP and general medical condition  F/U with rheumatologist as discussed   F/U  surgical evaluation. We will avoid surgical evaluation at this time  F/U neurological evaluation. May consider pending follow-up evaluations. We will avoid further studies at this time  May consider radiofrequency rhizolysis or intraspinal procedures pending response to present treatment and F/U evaluation  . We will avoid such treatment at this time  Patient to call Pain Management Center should patient have concerns prior to scheduled return appointment.

## 2016-02-14 NOTE — Progress Notes (Signed)
   Subjective:    Patient ID: Zachary Brewer, male    DOB: [birth year], 75 y.o.   MRN: [id number]  HPI  The patient is a 75 year old gentleman who returns to pain management for further evaluation and treatment of pain which is involving the region of the knee. The patient is accompanied by daughter on today's visit and follow agreed to patient continues to do remarkably well following geniculate nerve blocks of the knee. The patient also has decreased use of mobile and oxycodone. Patient is performing activities of daily living without experiencing severe disabling pain. We discussed patient's condition and will remain available to proceed with geniculate nerve blocks of the knee or other treatment should patient be with return of pain of significant degree. At the present time we will continue oxycodone as prescribed and avoid additional interventional treatment. All agreed to suggested treatment plan       Review of Systems     Objective:   Physical Exam  There was tenderness to palpation of paraspinal musculature region cervical region cervical facet region palpation which reproduces pain of minimal degree with minimal tenderness of the splenius capitis and occipitalis musculature regions. Palpation of the acromioclavicular and glenohumeral joint regions were tenderness to palpation of minimal degree and patient appeared to be with bilaterally equal grip strength. Tinel and Phalen's maneuver were without increased pain of significant degree. There was unremarkable Spurling's maneuver. Palpation of the thoracic region thoracic facet region was attends to palpation of minimal degree with no crepitus of the thoracic region noted. Palpation over the lumbar paraspinal musculature region lumbar facet region was attends to palpation of mild to moderate degree with no crepitus of the lumbar region noted. Gait leg raise was tolerated to 30 without increased pain with dorsiflexion noted. EHL strength  appeared to be decreased. The knee was with minimal tenderness to palpation with no increased warmth erythema of the knee noted. There was negative anterior and posterior drawer signs with no ballottement of the patella noted. No increased laxity of the joint was noted. Crepitus of the knee was noted and mild discomfort with manipulation of the knee. EHL strength appeared to be slightly decreased. 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 Caution Mobic can cause bleeding ulcers of the stomach and GI tract as well as liver and kidney damage as we discussed again today  You may continue Voltaren Gel applications to knee   We will consider geniculate nerve blocks of the knee if pain returns as we discussed  F/U PCP Dr. Luan Pulling for evaliation of  BP and general medical condition  F/U with rheumatologist as discussed   F/U  surgical evaluation. We will avoid surgical evaluation at this time  F/U neurological evaluation. May consider pending follow-up evaluations. We will avoid further studies at this time  May consider radiofrequency rhizolysis or intraspinal procedures pending response to present treatment and F/U evaluation  . We will avoid such treatment at this time  Patient to call Pain Management Center should patient have concerns prior to scheduled return appointment.

## 2016-02-22 LAB — TOXASSURE SELECT 13 (MW), URINE: PDF: 0

## 2016-02-22 NOTE — Progress Notes (Signed)
Quick Note:  Reviewed. ______ 

## 2016-03-20 ENCOUNTER — Ambulatory Visit: Payer: Medicare Other | Admitting: Pain Medicine

## 2016-03-20 ENCOUNTER — Ambulatory Visit: Payer: Medicare Other | Admitting: Family Medicine

## 2016-03-21 ENCOUNTER — Ambulatory Visit: Payer: Medicare Other | Admitting: Family Medicine

## 2016-03-27 DIAGNOSIS — H52223 Regular astigmatism, bilateral: Secondary | ICD-10-CM | POA: Diagnosis not present

## 2016-03-27 DIAGNOSIS — H524 Presbyopia: Secondary | ICD-10-CM | POA: Diagnosis not present

## 2016-03-27 DIAGNOSIS — H25813 Combined forms of age-related cataract, bilateral: Secondary | ICD-10-CM | POA: Diagnosis not present

## 2016-03-27 DIAGNOSIS — H5203 Hypermetropia, bilateral: Secondary | ICD-10-CM | POA: Diagnosis not present

## 2016-04-07 DIAGNOSIS — H903 Sensorineural hearing loss, bilateral: Secondary | ICD-10-CM | POA: Diagnosis not present

## 2016-04-22 ENCOUNTER — Ambulatory Visit (INDEPENDENT_AMBULATORY_CARE_PROVIDER_SITE_OTHER): Payer: Medicare Other | Admitting: Family Medicine

## 2016-04-22 ENCOUNTER — Encounter: Payer: Self-pay | Admitting: Family Medicine

## 2016-04-22 VITALS — BP 135/71 | HR 72 | Temp 99.8°F | Resp 16 | Ht 67.0 in | Wt 146.4 lb

## 2016-04-22 DIAGNOSIS — M1990 Unspecified osteoarthritis, unspecified site: Secondary | ICD-10-CM | POA: Diagnosis not present

## 2016-04-22 DIAGNOSIS — N400 Enlarged prostate without lower urinary tract symptoms: Secondary | ICD-10-CM | POA: Diagnosis not present

## 2016-04-22 DIAGNOSIS — K2100 Gastro-esophageal reflux disease with esophagitis, without bleeding: Secondary | ICD-10-CM

## 2016-04-22 DIAGNOSIS — I10 Essential (primary) hypertension: Secondary | ICD-10-CM

## 2016-04-22 DIAGNOSIS — K311 Adult hypertrophic pyloric stenosis: Secondary | ICD-10-CM

## 2016-04-22 MED ORDER — ENALAPRIL MALEATE 20 MG PO TABS: ORAL_TABLET | ORAL | Status: DC

## 2016-04-22 MED ORDER — OMEPRAZOLE 20 MG PO CPDR: 20.0000 mg | DELAYED_RELEASE_CAPSULE | Freq: Two times a day (BID) | ORAL | Status: DC

## 2016-04-22 MED ORDER — TAMSULOSIN HCL 0.4 MG PO CAPS: 0.4000 mg | ORAL_CAPSULE | Freq: Every day | ORAL | Status: DC

## 2016-04-22 MED ORDER — FUROSEMIDE 20 MG PO TABS: 20.0000 mg | ORAL_TABLET | Freq: Every day | ORAL | Status: DC

## 2016-04-22 MED ORDER — COLCHICINE 0.6 MG PO TABS: 0.6000 mg | ORAL_TABLET | Freq: Every day | ORAL | Status: DC

## 2016-04-22 NOTE — Progress Notes (Signed)
Name: Zachary Brewer   MRN: [id number]    DOB: 26-Sep-1941   Date:04/22/2016       Progress Note  Subjective  Chief Complaint  Chief Complaint  Patient presents with  . Hypertension  . Arthritis    Hypertension Pertinent negatives include no blurred vision, chest pain, headaches, malaise/fatigue, palpitations or shortness of breath.  Arthritis Pertinent negatives include no dysuria, fever, rash or weight loss.   Here for f/u of HBP.  Also with GERD with esophageal stricture.  Has to chew slowly and completely to swallow.  Has arthritis problems with chronic pain.   Cared for by Dr. Louisa Second, Pain Management.   BPs at home running in 120s-130s sys.  No problem-specific assessment & plan notes found for this encounter.   Past Medical History  Diagnosis Date  . Allergy   . GERD (gastroesophageal reflux disease)   . Hyperlipidemia   . Hypertension   . Arthritis 05/30/2015  . 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 Cervical pain 05/30/2015    Past Surgical History  Procedure Laterality Date  . Gastric outlet obstruction release  2013  . 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 Upper gi endoscopy    . Esophagogastroduodenoscopy (egd) with propofol N/A 06/20/2015    Procedure: ESOPHAGOGASTRODUODENOSCOPY (EGD) WITH PROPOFOL with dialation;  Surgeon: Lucilla Lame, MD;  Location: [place];  Service: Endoscopy;  Laterality: N/A;  . Flexible bronchoscopy N/A 06/22/2015    Procedure: FLEXIBLE BRONCHOSCOPY;  Surgeon: Allyne Gee, MD;  Location: [facility];  Service: Pulmonary;  Laterality: N/A;    Family History  Problem Relation Age of Onset  . Diabetes Mother   . Heart disease Father   . Diabetes Sister   . Diabetes Brother   . Stroke Brother   . Mental illness Brother   . Hypertension Paternal Grandfather     Social History   Social History  . Marital Status: Widowed    Spouse Name: N/A  . Number of Children: N/A  . Years of Education: N/A   Occupational History  . Not on file.    Social History Main Topics  . Smoking status: Never Smoker   . Smokeless tobacco: Never Used  . Alcohol Use: No  . Drug Use: No  . Sexual Activity: Not on file   Other Topics Concern  . Not on file   Social History Narrative     Current outpatient prescriptions:  .  acetaminophen (TYLENOL) 325 MG tablet, Take 650 mg by mouth every 6 (six) hours as needed., Disp: , Rfl:  .  Calcium 500-100 MG-UNIT CHEW, CHEW 1 TABLET BY MOUTH TWICE DAILY WITH A MEAL, Disp: , Rfl: 11 .  colchicine 0.6 MG tablet, Take 1 tablet (0.6 mg total) by mouth daily., Disp: 30 tablet, Rfl: 6 .  docusate sodium (COLACE) 100 MG capsule, Take 100 mg by mouth daily as needed for mild constipation. Reported on 12/24/2015, Disp: , Rfl:  .  enalapril (VASOTEC) 20 MG tablet, Take 1.5 tablets each AM for BP., Disp: 135 tablet, Rfl: 3 .  furosemide (LASIX) 20 MG tablet, Take 1 tablet (20 mg total) by mouth daily. Monday, Wednesday, Friday, Disp: 40 tablet, Rfl: 3 .  Glucosamine-Chondroit-Vit C-Mn (GLUCOSAMINE CHONDR 1500 COMPLX) CAPS, Take 1 capsule by mouth daily., Disp: , Rfl:  .  Multiple Vitamin (MULTIVITAMIN) tablet, Take 1 tablet by mouth daily. Reported on 11/21/2015, Disp: , Rfl:  .  omeprazole (PRILOSEC) 20 MG capsule, Take 1 capsule (20 mg total)  by mouth 2 (two) times daily before a meal., Disp: 180 capsule, Rfl: 3 .  oxyCODONE-acetaminophen (ROXICET) 5-325 MG tablet, Limit one half to one tab by mouth per day or 2  times per day if tolerated, Disp: 30 tablet, Rfl: 0 .  predniSONE (DELTASONE) 1 MG tablet, Take 1 mg by mouth daily with breakfast., Disp: , Rfl:  .  tamsulosin (FLOMAX) 0.4 MG CAPS capsule, Take 1 capsule (0.4 mg total) by mouth daily after breakfast., Disp: 90 capsule, Rfl: 3 .  Vitamin D, Ergocalciferol, (DRISDOL) 50000 UNITS CAPS capsule, Take 50,000 Units by mouth once a week. Reported on 02/14/2016, Disp: , Rfl: 0 .  meloxicam (MOBIC) 7.5 MG tablet, Take 7.5 mg by mouth as needed for pain., Disp: ,  Rfl:   Current facility-administered medications:  .  fentaNYL (SUBLIMAZE) injection 100 mcg, 100 mcg, Intravenous, Once, Mohammed Kindle, MD .  lactated ringers infusion 1,000 mL, 1,000 mL, Intravenous, Continuous, Mohammed Kindle, MD .  lidocaine (PF) (XYLOCAINE) 1 % injection 10 mL, 10 mL, Subcutaneous, Once, Mohammed Kindle, MD .  midazolam (VERSED) 5 MG/5ML injection 5 mg, 5 mg, Intravenous, Once, Mohammed Kindle, MD .  triamcinolone acetonide (KENALOG-40) injection 40 mg, 40 mg, Other, Once, Mohammed Kindle, MD  Not on File   Review of Systems  Constitutional: Negative for fever, chills, weight loss and malaise/fatigue.  HENT: Negative for hearing loss.   Eyes: Negative for blurred vision and double vision.  Respiratory: Negative for cough, shortness of breath and wheezing.   Cardiovascular: Negative for chest pain, palpitations and leg swelling.  Gastrointestinal: Negative for heartburn, abdominal pain and blood in stool.  Genitourinary: Negative for dysuria, urgency and frequency.  Musculoskeletal: Positive for joint pain and arthritis. Back pain: diffuse.  Skin: Negative for rash.  Neurological: Negative for dizziness, tremors, weakness and headaches.      Objective  Filed Vitals:   04/22/16 1310  BP: 135/71  Pulse: 72  Temp: 99.8 F (37.7 C)  TempSrc: Oral  Resp: 16  Height: 5\' 7"  (1.702 m)  Weight: 146 lb 6.4 oz (66.407 kg)    Physical Exam  Constitutional: He is oriented to person, place, and time and well-developed, well-nourished, and in no distress. No distress.  HENT:  Head: Normocephalic and atraumatic.  Eyes: Conjunctivae and EOM are normal. Pupils are equal, round, and reactive to light. No scleral icterus.  Neck: Normal range of motion. Neck supple. Carotid bruit is not present. No thyromegaly present.  Cardiovascular: Normal rate, regular rhythm and normal heart sounds.  Exam reveals no gallop and no friction rub.   No murmur heard. Pulmonary/Chest: Effort  normal and breath sounds normal. No respiratory distress. He has no wheezes. He has no rales.  Abdominal: Soft. Bowel sounds are normal. He exhibits no distension, no abdominal bruit and no mass. There is no tenderness.  Musculoskeletal: He exhibits no edema.  Lymphadenopathy:    He has no cervical adenopathy.  Neurological: He is alert and oriented to person, place, and time.  Vitals reviewed.      Recent Results (from the past 2160 hour(s))  ToxASSURE Select 13 (MW), Urine     Status: None   Collection Time: 02/14/16  1:50 PM  Result Value Ref Range   ToxAssure Select 13 FINAL     Comment: ==================================================================== TOXASSURE SELECT 13 (MW) ==================================================================== Test  Result       Flag       Units Drug Present and Declared for Prescription Verification   Oxymorphone                    147          EXPECTED   ng/mg creat   Noroxycodone                   206          EXPECTED   ng/mg creat   Noroxymorphone                 163          EXPECTED   ng/mg creat    Oxymorphone, noroxycodone and noroxymorphone are expected    metabolites of oxycodone. Noroxymorphone is an expected    metabolite of oxymorphone. Sources of oxycodone and/or    oxymorphone include scheduled prescription medications. Drug Absent but Declared for Prescription Verification   Oxycodone                      Not Detected UNEXPECTED ng/mg creat    Oxycodone is almost always present in patients taking this drug    consistently.  Absence of oxycodone could be due to lapse of time     since the last dose or unusual pharmacokinetics (rapid    metabolism). ==================================================================== Test                      Result    Flag   Units      Ref Range   Creatinine              163              mg/dL       >=20 ==================================================================== Declared Medications:  The flagging and interpretation on this report are based on the  following declared medications.  Unexpected results may arise from  inaccuracies in the declared medications.  **Note: The testing scope of this panel includes these medications:  Oxycodone (Oxycodone Acetaminophen)  **Note: The testing scope of this panel does not include following  reported medications:  Acetaminophen  Acetaminophen (Oxycodone Acetaminophen)  Atorvastatin  Calcium  Colchicine  Diclofenac (Voltaren)  Docusate (Colace)  Enalapril (Vasotec)  Furosemide (Lasix)  Meloxicam (Mobic)  Multivitamin  Omeprazole  Prednisone (Deltasone)   Tamsulosin (Flomax)  Vitamin D2 (Drisdol) ==================================================================== For clinical consultation, please call [phone]. ====================================================================    PDF .      Assessment & Plan  Problem List Items Addressed This Visit      Cardiovascular and Mediastinum   Essential (primary) hypertension - Primary   Relevant Medications   enalapril (VASOTEC) 20 MG tablet   furosemide (LASIX) 20 MG tablet     Digestive   Gastric out let obstruction   Esophagitis, reflux   Relevant Medications   omeprazole (PRILOSEC) 20 MG capsule     Musculoskeletal and Integument   Arthritis   Relevant Medications   colchicine 0.6 MG tablet     Genitourinary   BPH (benign prostatic hyperplasia)   Relevant Medications   tamsulosin (FLOMAX) 0.4 MG CAPS capsule      Meds ordered this encounter  Medications  . Glucosamine-Chondroit-Vit C-Mn (GLUCOSAMINE CHONDR 1500 COMPLX) CAPS    Sig: Take 1 capsule by mouth daily.  . colchicine 0.6 MG tablet    Sig: Take 1 tablet (0.6  mg total) by mouth daily.    Dispense:  30 tablet    Refill:  6  . enalapril (VASOTEC) 20 MG tablet    Sig: Take 1.5 tablets  each AM for BP.    Dispense:  135 tablet    Refill:  3  . furosemide (LASIX) 20 MG tablet    Sig: Take 1 tablet (20 mg total) by mouth daily. Monday, Wednesday, Friday    Dispense:  40 tablet    Refill:  3  . omeprazole (PRILOSEC) 20 MG capsule    Sig: Take 1 capsule (20 mg total) by mouth 2 (two) times daily before a meal.    Dispense:  180 capsule    Refill:  3  . tamsulosin (FLOMAX) 0.4 MG CAPS capsule    Sig: Take 1 capsule (0.4 mg total) by mouth daily after breakfast.    Dispense:  90 capsule    Refill:  3   1. Essential (primary) hypertension  - enalapril (VASOTEC) 20 MG tablet; Take 1.5 tablets each AM for BP.  Dispense: 135 tablet; Refill: 3 - furosemide (LASIX) 20 MG tablet; Take 1 tablet (20 mg total) by mouth daily. Monday, Wednesday, Friday  Dispense: 40 tablet; Refill: 3  2. Arthritis  - colchicine 0.6 MG tablet; Take 1 tablet (0.6 mg total) by mouth daily.  Dispense: 30 tablet; Refill: 6  3. Esophagitis, reflux  - omeprazole (PRILOSEC) 20 MG capsule; Take 1 capsule (20 mg total) by mouth 2 (two) times daily before a meal.  Dispense: 180 capsule; Refill: 3  4. Gastric out let obstruction   5. BPH (benign prostatic hyperplasia)  - tamsulosin (FLOMAX) 0.4 MG CAPS capsule; Take 1 capsule (0.4 mg total) by mouth daily after breakfast.  Dispense: 90 capsule; Refill: 3

## 2016-04-28 ENCOUNTER — Other Ambulatory Visit: Payer: Self-pay | Admitting: Family Medicine

## 2016-04-30 ENCOUNTER — Other Ambulatory Visit: Payer: Self-pay | Admitting: Family Medicine

## 2016-04-30 MED ORDER — GLUCOSE BLOOD VI STRP
ORAL_STRIP | Status: DC
Start: 2016-04-30 — End: 2016-09-09

## 2016-04-30 MED ORDER — ACCU-CHEK AVIVA DEVI: Status: AC

## 2016-04-30 MED ORDER — ACCU-CHEK MULTICLIX LANCETS MISC: Status: DC

## 2016-04-30 NOTE — Telephone Encounter (Signed)
New rx for accu chek sent to pharmacy meter, strip and lancets.

## 2016-04-30 NOTE — Telephone Encounter (Signed)
Renee from  [place] called states that pt insurance would not could his meter so they need a prescription for  accu-check meter.  [place] call back # [phone]

## 2016-05-01 ENCOUNTER — Telehealth: Payer: Self-pay | Admitting: Family Medicine

## 2016-05-01 ENCOUNTER — Ambulatory Visit: Payer: Medicare Other | Admitting: Pain Medicine

## 2016-05-01 NOTE — Telephone Encounter (Signed)
Patient made aware new rx sent to pharmacy.[place]

## 2016-05-01 NOTE — Telephone Encounter (Signed)
Pt needs a refill on test strips sent to [facility].  His call back number is [phone]

## 2016-05-02 ENCOUNTER — Ambulatory Visit: Payer: Medicare Other | Attending: Pain Medicine | Admitting: Pain Medicine

## 2016-05-02 ENCOUNTER — Encounter: Payer: Self-pay | Admitting: Pain Medicine

## 2016-05-02 VITALS — BP 109/69 | HR 73 | Temp 98.6°F | Resp 18 | Ht 67.0 in | Wt 141.0 lb

## 2016-05-02 DIAGNOSIS — M5030 Other cervical disc degeneration, unspecified cervical region: Secondary | ICD-10-CM | POA: Diagnosis not present

## 2016-05-02 DIAGNOSIS — M109 Gout, unspecified: Secondary | ICD-10-CM | POA: Insufficient documentation

## 2016-05-02 DIAGNOSIS — M1990 Unspecified osteoarthritis, unspecified site: Secondary | ICD-10-CM

## 2016-05-02 DIAGNOSIS — M170 Bilateral primary osteoarthritis of knee: Secondary | ICD-10-CM | POA: Diagnosis not present

## 2016-05-02 DIAGNOSIS — G894 Chronic pain syndrome: Secondary | ICD-10-CM

## 2016-05-02 DIAGNOSIS — M19042 Primary osteoarthritis, left hand: Secondary | ICD-10-CM | POA: Insufficient documentation

## 2016-05-02 DIAGNOSIS — M19012 Primary osteoarthritis, left shoulder: Secondary | ICD-10-CM | POA: Diagnosis not present

## 2016-05-02 DIAGNOSIS — M19011 Primary osteoarthritis, right shoulder: Secondary | ICD-10-CM | POA: Insufficient documentation

## 2016-05-02 DIAGNOSIS — M5416 Radiculopathy, lumbar region: Secondary | ICD-10-CM | POA: Diagnosis not present

## 2016-05-02 DIAGNOSIS — M5136 Other intervertebral disc degeneration, lumbar region: Secondary | ICD-10-CM | POA: Diagnosis not present

## 2016-05-02 DIAGNOSIS — M179 Osteoarthritis of knee, unspecified: Secondary | ICD-10-CM | POA: Diagnosis not present

## 2016-05-02 DIAGNOSIS — M1000 Idiopathic gout, unspecified site: Secondary | ICD-10-CM

## 2016-05-02 DIAGNOSIS — M19041 Primary osteoarthritis, right hand: Secondary | ICD-10-CM | POA: Diagnosis not present

## 2016-05-02 DIAGNOSIS — M461 Sacroiliitis, not elsewhere classified: Secondary | ICD-10-CM | POA: Diagnosis not present

## 2016-05-02 DIAGNOSIS — M5134 Other intervertebral disc degeneration, thoracic region: Secondary | ICD-10-CM | POA: Insufficient documentation

## 2016-05-02 DIAGNOSIS — M25561 Pain in right knee: Secondary | ICD-10-CM | POA: Diagnosis present

## 2016-05-02 DIAGNOSIS — M25562 Pain in left knee: Secondary | ICD-10-CM | POA: Diagnosis present

## 2016-05-02 DIAGNOSIS — M47817 Spondylosis without myelopathy or radiculopathy, lumbosacral region: Secondary | ICD-10-CM | POA: Diagnosis not present

## 2016-05-02 MED ORDER — OXYCODONE-ACETAMINOPHEN 5-325 MG PO TABS: ORAL_TABLET | ORAL | Status: DC

## 2016-05-02 NOTE — Patient Instructions (Addendum)
PLAN   Continue present medication oxycodone acetaminophen You may apply Voltaren Gel applications to knee as discussed  We will consider geniculate nerve blocks of the knee if pain returns as we discussed Please call if pain of knee returns and we will consider plan of treatment at that time.  F/U PCP Dr. Luan Pulling for evaliation of  BP and general medical condition  F/U with rheumatologist as discussed   F/U  surgical evaluation. We will avoid surgical evaluation at this time  F/U neurological evaluation. May consider pending follow-up evaluations. We will avoid further studies at this time  May consider radiofrequency rhizolysis or intraspinal procedures pending response to present treatment and F/U evaluation  . We will avoid such treatment at this time  Patient to call [facility] Center should patient have concerns prior to scheduled return appointment.

## 2016-05-02 NOTE — Progress Notes (Signed)
Safety precautions to be maintained throughout the outpatient stay will include: orient to surroundings, keep bed in low position, maintain call bell within reach at all times, provide assistance with transfer out of bed and ambulation.  

## 2016-05-02 NOTE — Progress Notes (Signed)
   Subjective:    Patient ID: Zachary Brewer, male    DOB: 1941/05/14, 75 y.o.   MRN: [id number]  HPI  The patient is a 75 year old gentleman who returns to pain management for further evaluation and treatment of pain involving the region of the knees predominantly. The patient is with some lower back pain and is without any significant pain at this time and Without pain interfering with activities of daily living. The patient is with prior interventional treatment of the knee and has had severe significant decrease in his pain since the procedure. The patient is with minimal use of oxycodone. We will continue to avoid interventional treatment and we'll observe response to the oxycodone acetaminophen which patient is taking and will consider modification of treatment should they be return of significant pain. All agreed to suggested treatment plan  Review of Systems     Objective:   Physical Exam  There was tenderness of the splenius capitis and occipitalis region of minimal degree with minimal tenderness of the cervical facet cervical paraspinal musculature region as well as minimal tenderness over the region of the thoracic facet thoracic paraspinal musculature region. The patient was with bilaterally equal grip strength with Tinel and Phalen's maneuver reproducing minimal discomfort. There was no crepitus of the thoracic region noted. Palpation over the lumbar region lumbar facet region was with minimal tense to palpation with lateral bending rotation extension and palpation of the lumbar facets reproducing minimal discomfort. Straight leg raise was tolerated to 30 without increase of pain with dorsiflexion noted. The knees were with crepitus of the knees with no increased warmth or erythema. There was negative anterior and posterior drawer signs without ballottement of the patella. DTRs appeared to be trace at the knees. No sensory deficit or dermatomal dystrophy detected. Negative clonus negative  Homans. Minimal tenderness of the PSIS and PII S region and minimal tenderness of the greater trochanteric region iliotibial band region. 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  F/U PCP Dr. Luan Pulling for evaliation of  BP and general medical condition  F/U with rheumatologist as discussed   F/U  surgical evaluation. We will avoid surgical evaluation at this time  F/U neurological evaluation. May consider pending follow-up evaluations. We will avoid further studies at this time  May consider radiofrequency rhizolysis or intraspinal procedures pending response to present treatment and F/U evaluation  . We will avoid such treatment at this time  Patient to call Pain Management Center should patient have concerns prior to scheduled return appointment.

## 2016-05-22 DIAGNOSIS — H2513 Age-related nuclear cataract, bilateral: Secondary | ICD-10-CM | POA: Diagnosis not present

## 2016-05-30 DIAGNOSIS — H2513 Age-related nuclear cataract, bilateral: Secondary | ICD-10-CM | POA: Diagnosis not present

## 2016-06-02 ENCOUNTER — Encounter: Payer: Self-pay | Admitting: *Deleted

## 2016-06-06 NOTE — Discharge Instructions (Signed)

## 2016-06-11 ENCOUNTER — Ambulatory Visit
Admission: RE | Admit: 2016-06-11 | Discharge: 2016-06-11 | Disposition: A | Discharge status: Home / Self Care | Payer: Medicare Other | Source: Ambulatory Visit | Attending: Ophthalmology | Admitting: Ophthalmology

## 2016-06-11 ENCOUNTER — Encounter
Admission: RE | Disposition: A | Discharge status: Home / Self Care | Payer: Self-pay | Source: Ambulatory Visit | Attending: Ophthalmology

## 2016-06-11 ENCOUNTER — Ambulatory Visit: Payer: Medicare Other | Admitting: Anesthesiology

## 2016-06-11 DIAGNOSIS — H2511 Age-related nuclear cataract, right eye: Secondary | ICD-10-CM | POA: Insufficient documentation

## 2016-06-11 DIAGNOSIS — Z9049 Acquired absence of other specified parts of digestive tract: Secondary | ICD-10-CM | POA: Insufficient documentation

## 2016-06-11 DIAGNOSIS — Z8701 Personal history of pneumonia (recurrent): Secondary | ICD-10-CM | POA: Diagnosis not present

## 2016-06-11 DIAGNOSIS — M109 Gout, unspecified: Secondary | ICD-10-CM | POA: Insufficient documentation

## 2016-06-11 DIAGNOSIS — Z9889 Other specified postprocedural states: Secondary | ICD-10-CM | POA: Insufficient documentation

## 2016-06-11 DIAGNOSIS — Z79899 Other long term (current) drug therapy: Secondary | ICD-10-CM | POA: Insufficient documentation

## 2016-06-11 DIAGNOSIS — Z7952 Long term (current) use of systemic steroids: Secondary | ICD-10-CM | POA: Diagnosis not present

## 2016-06-11 DIAGNOSIS — I10 Essential (primary) hypertension: Secondary | ICD-10-CM | POA: Insufficient documentation

## 2016-06-11 DIAGNOSIS — N400 Enlarged prostate without lower urinary tract symptoms: Secondary | ICD-10-CM | POA: Diagnosis not present

## 2016-06-11 DIAGNOSIS — Z791 Long term (current) use of non-steroidal anti-inflammatories (NSAID): Secondary | ICD-10-CM | POA: Diagnosis not present

## 2016-06-11 DIAGNOSIS — Z79891 Long term (current) use of opiate analgesic: Secondary | ICD-10-CM | POA: Diagnosis not present

## 2016-06-11 DIAGNOSIS — R251 Tremor, unspecified: Secondary | ICD-10-CM | POA: Diagnosis not present

## 2016-06-11 DIAGNOSIS — H2513 Age-related nuclear cataract, bilateral: Secondary | ICD-10-CM | POA: Diagnosis not present

## 2016-06-11 HISTORY — DX: Presence of dental prosthetic device (complete) (partial): Z97.2

## 2016-06-11 HISTORY — PX: CATARACT EXTRACTION W/PHACO: SHX586

## 2016-06-11 HISTORY — DX: Presence of external hearing-aid: Z97.4

## 2016-06-11 SURGERY — PHACOEMULSIFICATION, CATARACT, WITH IOL INSERTION
Anesthesia: Monitor Anesthesia Care | Laterality: Right | Wound class: Clean

## 2016-06-11 MED ORDER — ARMC OPHTHALMIC DILATING GEL
1.0000 "application " | OPHTHALMIC | Status: DC | PRN
  Administered 2016-06-11 (×2): 1 via OPHTHALMIC

## 2016-06-11 MED ORDER — CEFUROXIME OPHTHALMIC INJECTION 1 MG/0.1 ML
INJECTION | OPHTHALMIC | Status: DC | PRN
  Administered 2016-06-11: 0.1 mL via OPHTHALMIC

## 2016-06-11 MED ORDER — TIMOLOL MALEATE 0.5 % OP SOLN
OPHTHALMIC | Status: DC | PRN
  Administered 2016-06-11: 1 [drp] via OPHTHALMIC

## 2016-06-11 MED ORDER — TETRACAINE HCL 0.5 % OP SOLN: OPHTHALMIC | Status: DC

## 2016-06-11 MED ORDER — BRIMONIDINE TARTRATE 0.2 % OP SOLN
OPHTHALMIC | Status: DC | PRN
  Administered 2016-06-11: 1 [drp] via OPHTHALMIC

## 2016-06-11 MED ORDER — FENTANYL CITRATE (PF) 100 MCG/2ML IJ SOLN
INTRAMUSCULAR | Status: DC | PRN
  Administered 2016-06-11: 50 ug via INTRAVENOUS

## 2016-06-11 MED ORDER — TETRACAINE HCL 0.5 % OP SOLN
1.0000 [drp] | OPHTHALMIC | Status: DC | PRN
  Administered 2016-06-11: 1 [drp] via OPHTHALMIC

## 2016-06-11 MED ORDER — NA HYALUR & NA CHOND-NA HYALUR 0.4-0.35 ML IO KIT
PACK | INTRAOCULAR | Status: DC | PRN
  Administered 2016-06-11: 1 mL via INTRAOCULAR

## 2016-06-11 MED ORDER — EPINEPHRINE HCL 1 MG/ML IJ SOLN
INTRAOCULAR | Status: DC | PRN
  Administered 2016-06-11: 65 mL via OPHTHALMIC

## 2016-06-11 MED ORDER — POVIDONE-IODINE 5 % OP SOLN
1.0000 "application " | OPHTHALMIC | Status: DC | PRN
  Administered 2016-06-11: 1 via OPHTHALMIC

## 2016-06-11 MED ORDER — MIDAZOLAM HCL 2 MG/2ML IJ SOLN
INTRAMUSCULAR | Status: DC | PRN
  Administered 2016-06-11: 0.5 mg via INTRAVENOUS
  Administered 2016-06-11: 1 mg via INTRAVENOUS
  Administered 2016-06-11: 0.5 mg via INTRAVENOUS

## 2016-06-11 MED ORDER — LIDOCAINE HCL (PF) 4 % IJ SOLN
INTRAOCULAR | Status: DC | PRN
  Administered 2016-06-11: 1 mL via OPHTHALMIC

## 2016-06-11 SURGICAL SUPPLY — 25 items
CANNULA ANT/CHMB 27GA (MISCELLANEOUS) ×3 IMPLANT
CARTRIDGE ABBOTT (MISCELLANEOUS) IMPLANT
GLOVE SURG LX 7.5 STRW (GLOVE) ×2
GLOVE SURG LX STRL 7.5 STRW (GLOVE) ×1 IMPLANT
GLOVE SURG TRIUMPH 8.0 PF LTX (GLOVE) ×3 IMPLANT
GOWN STRL REUS W/ TWL LRG LVL3 (GOWN DISPOSABLE) ×2 IMPLANT
GOWN STRL REUS W/TWL LRG LVL3 (GOWN DISPOSABLE) ×4
LENS IOL TECNIS ITEC 23.0 (Intraocular Lens) ×3 IMPLANT
MARKER SKIN DUAL TIP RULER LAB (MISCELLANEOUS) ×3 IMPLANT
NDL RETROBULBAR .5 NSTRL (NEEDLE) IMPLANT
NEEDLE FILTER BLUNT 18X 1/2SAF (NEEDLE) ×2
NEEDLE FILTER BLUNT 18X1 1/2 (NEEDLE) ×1 IMPLANT
PACK CATARACT BRASINGTON (MISCELLANEOUS) ×3 IMPLANT
PACK EYE AFTER SURG (MISCELLANEOUS) ×3 IMPLANT
PACK OPTHALMIC (MISCELLANEOUS) ×3 IMPLANT
RING MALYGIN 7.0 (MISCELLANEOUS) IMPLANT
SUT ETHILON 10-0 CS-B-6CS-B-6 (SUTURE)
SUT VICRYL  9 0 (SUTURE)
SUT VICRYL 9 0 (SUTURE) IMPLANT
SUTURE EHLN 10-0 CS-B-6CS-B-6 (SUTURE) IMPLANT
SYR 3ML LL SCALE MARK (SYRINGE) ×3 IMPLANT
SYR 5ML LL (SYRINGE) ×3 IMPLANT
SYR TB 1ML LUER SLIP (SYRINGE) ×3 IMPLANT
WATER STERILE IRR 250ML POUR (IV SOLUTION) ×3 IMPLANT
WIPE NON LINTING 3.25X3.25 (MISCELLANEOUS) ×3 IMPLANT

## 2016-06-11 NOTE — H&P (Signed)
  The History and Physical notes are on paper, have been signed, and are to be scanned. The patient remains stable and unchanged from the H&P.   Previous H&P reviewed, patient examined, and there are no changes.  Zachary Brewer 06/11/2016 11:03 AM

## 2016-06-11 NOTE — Transfer of Care (Signed)
Immediate Anesthesia Transfer of Care Note  Patient: Zachary Brewer  Procedure(s) Performed: Procedure(s): CATARACT EXTRACTION PHACO AND INTRAOCULAR LENS PLACEMENT (IOC) (Right)  Patient Location: PACU  Anesthesia Type: MAC  Level of Consciousness: awake, alert  and patient cooperative  Airway and Oxygen Therapy: Patient Spontanous Breathing and Patient connected to supplemental oxygen  Post-op Assessment: Post-op Vital signs reviewed, Patient's Cardiovascular Status Stable, Respiratory Function Stable, Patent Airway and No signs of Nausea or vomiting  Post-op Vital Signs: Reviewed and stable  Complications: No apparent anesthesia complications

## 2016-06-11 NOTE — Anesthesia Postprocedure Evaluation (Signed)
Anesthesia Post Note  Patient: Zachary Brewer  Procedure(s) Performed: Procedure(s) (LRB): CATARACT EXTRACTION PHACO AND INTRAOCULAR LENS PLACEMENT (IOC) (Right)  Patient location during evaluation: PACU Anesthesia Type: MAC Level of consciousness: awake and alert Pain management: pain level controlled Vital Signs Assessment: post-procedure vital signs reviewed and stable Respiratory status: spontaneous breathing, nonlabored ventilation, respiratory function stable and patient connected to nasal cannula oxygen Cardiovascular status: stable and blood pressure returned to baseline Anesthetic complications: no    Amaryllis Dyke

## 2016-06-11 NOTE — Anesthesia Preprocedure Evaluation (Signed)
Anesthesia Evaluation  Patient identified by MRN, date of birth, ID band  Reviewed: NPO status   History of Anesthesia Complications Negative for: history of anesthetic complications  Airway Mallampati: II  TM Distance: >3 FB Neck ROM: full    Dental  (+) Edentulous Lower, Edentulous Upper   Pulmonary asthma ,    Pulmonary exam normal       Cardiovascular Exercise Tolerance: Good hypertension, Normal cardiovascular exam Lipids    Neuro/Psych negative neurological ROS  negative psych ROS   GI/Hepatic Neg liver ROS, GERD-  Medicated,  Endo/Other  negative endocrine ROS  Renal/GU negative Renal ROS   bph    Musculoskeletal  (+) Arthritis -,   Abdominal   Peds  Hematology negative hematology ROS (+)   Anesthesia Other Findings   Reproductive/Obstetrics                             Anesthesia Physical Anesthesia Plan  ASA: II  Anesthesia Plan: MAC   Post-op Pain Management:    Induction:   Airway Management Planned:   Additional Equipment:   Intra-op Plan:   Post-operative Plan:   Informed Consent: I have reviewed the patients History and Physical, chart, labs and discussed the procedure including the risks, benefits and alternatives for the proposed anesthesia with the patient or authorized representative who has indicated his/her understanding and acceptance.     Plan Discussed with: CRNA  Anesthesia Plan Comments:         Anesthesia Quick Evaluation  

## 2016-06-11 NOTE — Op Note (Signed)
LOCATION:  [place]   PREOPERATIVE DIAGNOSIS:    Nuclear sclerotic cataract right eye. H25.11   POSTOPERATIVE DIAGNOSIS:  Nuclear sclerotic cataract right eye.     PROCEDURE:  Phacoemusification with posterior chamber intraocular lens placement of the right eye   LENS:   Implant Name Type Inv. Item Serial No. Manufacturer Lot No. LRB No. Used  LENS IOL DIOP 23.0 - [id number] Intraocular Lens LENS IOL DIOP 23.0 [id number]   Right 1        ULTRASOUND TIME: 16 % of 1 minutes, 15 seconds.  CDE 11.8   SURGEON:  Wyonia Hough, MD   ANESTHESIA:  Topical with tetracaine drops and 2% Xylocaine jelly, augmented with 1% preservative-free intracameral lidocaine.    COMPLICATIONS:  None.   DESCRIPTION OF PROCEDURE:  The patient was identified in the holding room and transported to the operating room and placed in the supine position under the operating microscope.  The right eye was identified as the operative eye and it was prepped and draped in the usual sterile ophthalmic fashion.   A 1 millimeter clear-corneal paracentesis was made at the 12:00 position.  0.5 ml of preservative-free 1% lidocaine was injected into the anterior chamber. The anterior chamber was filled with Viscoat viscoelastic.  A 2.4 millimeter keratome was used to make a near-clear corneal incision at the 9:00 position.  A curvilinear capsulorrhexis was made with a cystotome and capsulorrhexis forceps.  Balanced salt solution was used to hydrodissect and hydrodelineate the nucleus.   Phacoemulsification was then used in stop and chop fashion to remove the lens nucleus and epinucleus.  The remaining cortex was then removed using the irrigation and aspiration handpiece. Provisc was then placed into the capsular bag to distend it for lens placement.  A lens was then injected into the capsular bag.  The remaining viscoelastic was aspirated.   Wounds were hydrated with balanced salt solution.  The anterior  chamber was inflated to a physiologic pressure with balanced salt solution.  No wound leaks were noted. Cefuroxime 0.1 ml of a 10mg /ml solution was injected into the anterior chamber for a dose of 1 mg of intracameral antibiotic at the completion of the case.   Timolol and Brimonidine drops were applied to the eye.  The patient was taken to the recovery room in stable condition without complications of anesthesia or surgery.   Ilia Dimaano 06/11/2016, 11:50 AM

## 2016-06-11 NOTE — Anesthesia Procedure Notes (Signed)
Procedure Name: MAC Date/Time: 06/11/2016 11:29 AM Performed by: Janna Arch Pre-anesthesia Checklist: Patient identified, Emergency Drugs available, Suction available, Patient being monitored and Timeout performed Patient Re-evaluated:Patient Re-evaluated prior to inductionOxygen Delivery Method: Nasal cannula

## 2016-06-24 ENCOUNTER — Telehealth: Payer: Self-pay | Admitting: *Deleted

## 2016-06-26 DIAGNOSIS — H2512 Age-related nuclear cataract, left eye: Secondary | ICD-10-CM | POA: Diagnosis not present

## 2016-07-01 ENCOUNTER — Encounter: Payer: Self-pay | Admitting: *Deleted

## 2016-07-02 ENCOUNTER — Encounter: Payer: Self-pay | Admitting: Pain Medicine

## 2016-07-02 ENCOUNTER — Ambulatory Visit: Payer: Medicare Other | Attending: Pain Medicine | Admitting: Pain Medicine

## 2016-07-02 VITALS — BP 137/69 | HR 66 | Temp 97.7°F | Resp 16 | Ht 67.0 in | Wt 142.0 lb

## 2016-07-02 DIAGNOSIS — M1120 Other chondrocalcinosis, unspecified site: Secondary | ICD-10-CM

## 2016-07-02 DIAGNOSIS — M5030 Other cervical disc degeneration, unspecified cervical region: Secondary | ICD-10-CM | POA: Diagnosis not present

## 2016-07-02 DIAGNOSIS — M1990 Unspecified osteoarthritis, unspecified site: Secondary | ICD-10-CM | POA: Diagnosis not present

## 2016-07-02 DIAGNOSIS — M25519 Pain in unspecified shoulder: Secondary | ICD-10-CM

## 2016-07-02 DIAGNOSIS — M5136 Other intervertebral disc degeneration, lumbar region: Secondary | ICD-10-CM | POA: Diagnosis not present

## 2016-07-02 DIAGNOSIS — M19042 Primary osteoarthritis, left hand: Secondary | ICD-10-CM | POA: Insufficient documentation

## 2016-07-02 DIAGNOSIS — M19011 Primary osteoarthritis, right shoulder: Secondary | ICD-10-CM | POA: Diagnosis not present

## 2016-07-02 DIAGNOSIS — M5134 Other intervertebral disc degeneration, thoracic region: Secondary | ICD-10-CM | POA: Diagnosis not present

## 2016-07-02 DIAGNOSIS — M179 Osteoarthritis of knee, unspecified: Secondary | ICD-10-CM | POA: Diagnosis not present

## 2016-07-02 DIAGNOSIS — M47817 Spondylosis without myelopathy or radiculopathy, lumbosacral region: Secondary | ICD-10-CM | POA: Diagnosis not present

## 2016-07-02 DIAGNOSIS — M19012 Primary osteoarthritis, left shoulder: Secondary | ICD-10-CM | POA: Diagnosis not present

## 2016-07-02 DIAGNOSIS — M461 Sacroiliitis, not elsewhere classified: Secondary | ICD-10-CM | POA: Diagnosis not present

## 2016-07-02 DIAGNOSIS — M5416 Radiculopathy, lumbar region: Secondary | ICD-10-CM | POA: Diagnosis not present

## 2016-07-02 DIAGNOSIS — M19041 Primary osteoarthritis, right hand: Secondary | ICD-10-CM | POA: Insufficient documentation

## 2016-07-02 DIAGNOSIS — M545 Low back pain: Secondary | ICD-10-CM | POA: Diagnosis present

## 2016-07-02 DIAGNOSIS — M170 Bilateral primary osteoarthritis of knee: Secondary | ICD-10-CM | POA: Insufficient documentation

## 2016-07-02 DIAGNOSIS — M11262 Other chondrocalcinosis, left knee: Secondary | ICD-10-CM

## 2016-07-02 DIAGNOSIS — M79606 Pain in leg, unspecified: Secondary | ICD-10-CM | POA: Diagnosis present

## 2016-07-02 DIAGNOSIS — M109 Gout, unspecified: Secondary | ICD-10-CM | POA: Insufficient documentation

## 2016-07-02 MED ORDER — OXYCODONE-ACETAMINOPHEN 5-325 MG PO TABS: ORAL_TABLET | ORAL | 0 refills | Status: DC

## 2016-07-02 NOTE — Progress Notes (Signed)
Patient here for medication management Safety precautions to be maintained throughout the outpatient stay will include: orient to surroundings, keep bed in low position, maintain call bell within reach at all times, provide assistance with transfer out of bed and ambulation.  

## 2016-07-02 NOTE — Patient Instructions (Signed)
PLAN   Continue present medication oxycodone acetaminophen You may apply Voltaren Gel applications to knee as we previously discussed  We will consider geniculate nerve blocks of the knee if pain returns as we previously discussed Please call if pain of knee returns and we will consider plan of treatment at that time.  F/U PCP Dr. Luan Pulling for evaliation of  BP and general medical condition  F/U with rheumatologist as discussed   F/U  surgical evaluation. We will avoid surgical evaluation at this time  F/U neurological evaluation. May consider pending follow-up evaluations. We will avoid further studies at this time  May consider radiofrequency rhizolysis or intraspinal procedures pending response to present treatment and F/U evaluation  . We will avoid such treatment at this time  Patient to call Pain Management Center should patient have concerns prior to scheduled return appointment.

## 2016-07-03 ENCOUNTER — Ambulatory Visit: Payer: Medicare Other | Admitting: Pain Medicine

## 2016-07-03 NOTE — Progress Notes (Signed)
      The patient is a 75 year old gentleman who returns to pain management for further evaluation and treatment of pain involving the lower back lower extremity region and region of the shoulder. The patient denies any recent trauma or change in events of daily living the call significant change in symptomatology. The patient states that the pain involving the left knee reproduced pain of mild degree.. The patient is able to perform most activities of daily living without experiencing severe disabling pain. The patient is accompanied by daughter on today's visit. Both agreed that at times the patient does have pain of breath of moderate degree. We discussed additional interventional treatment and we will consider such treatment as discussed at the present time we will continue oxycodone acetaminophen as well as topical applications to the knee and consider additional modifications of treatment pending follow-up evaluation. All agreed to suggested treatment plan.   Physical examination   There was minimal tenderness to palpation of paraspinal muscular treat the cervical region cervical facet region. Palpation of the thoracic facet thoracic paraspinal musculature region reproduces minimal discomfort as well. There was tenderness over the region of the thoracic facet thoracic paraspinal musculature region of minimal degree. The patient was able to perform drop test without significant difficulty. There was negative Spurling's maneuver. Palpation over the lumbar region was of increased pain of minimal degree with lateral bending rotation extension and palpation of the lumbar facets reproducing minimal discomfort. There was tenderness over the PSIS and PII S region of minimal degree. Straight leg raise was tolerated to 30 without increased pain with dorsiflexion noted. Knees were with tenderness to palpation with crepitus of the knees without increased warmth or erythema and no ballottement of the patella.  There was negative anterior and posterior drawer signs palpation of the greater trochanteric region iliotibial band region reproduced minimal discomfort as well. Abdomen was nontender and no costovertebral tenderness noted.     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  F/U PCP Dr. Luan Pulling for evaliation of  BP and general medical condition  F/U with rheumatologist as discussed   F/U  surgical evaluation. We will avoid surgical evaluation at this time  F/U neurological evaluation. May consider pending follow-up evaluations. We will avoid further studies at this time  May consider radiofrequency rhizolysis or intraspinal procedures pending response to present treatment and F/U evaluation  . We will avoid such treatment at this time  [facility] to call [facility] should patient have concerns prior to scheduled return appointment.

## 2016-07-04 NOTE — Discharge Instructions (Signed)

## 2016-07-09 ENCOUNTER — Ambulatory Visit: Payer: Medicare Other | Admitting: Anesthesiology

## 2016-07-09 ENCOUNTER — Encounter
Admission: RE | Disposition: A | Discharge status: Home / Self Care | Payer: Self-pay | Source: Ambulatory Visit | Attending: Ophthalmology

## 2016-07-09 ENCOUNTER — Ambulatory Visit
Admission: RE | Admit: 2016-07-09 | Discharge: 2016-07-09 | Disposition: A | Discharge status: Home / Self Care | Payer: Medicare Other | Source: Ambulatory Visit | Attending: Ophthalmology | Admitting: Ophthalmology

## 2016-07-09 DIAGNOSIS — H2512 Age-related nuclear cataract, left eye: Secondary | ICD-10-CM | POA: Diagnosis not present

## 2016-07-09 DIAGNOSIS — Z79891 Long term (current) use of opiate analgesic: Secondary | ICD-10-CM | POA: Insufficient documentation

## 2016-07-09 DIAGNOSIS — I10 Essential (primary) hypertension: Secondary | ICD-10-CM | POA: Diagnosis not present

## 2016-07-09 DIAGNOSIS — Z9109 Other allergy status, other than to drugs and biological substances: Secondary | ICD-10-CM | POA: Insufficient documentation

## 2016-07-09 DIAGNOSIS — Z79899 Other long term (current) drug therapy: Secondary | ICD-10-CM | POA: Diagnosis not present

## 2016-07-09 DIAGNOSIS — Z9889 Other specified postprocedural states: Secondary | ICD-10-CM | POA: Insufficient documentation

## 2016-07-09 DIAGNOSIS — Z8701 Personal history of pneumonia (recurrent): Secondary | ICD-10-CM | POA: Insufficient documentation

## 2016-07-09 DIAGNOSIS — Z9049 Acquired absence of other specified parts of digestive tract: Secondary | ICD-10-CM | POA: Insufficient documentation

## 2016-07-09 DIAGNOSIS — N400 Enlarged prostate without lower urinary tract symptoms: Secondary | ICD-10-CM | POA: Insufficient documentation

## 2016-07-09 DIAGNOSIS — M109 Gout, unspecified: Secondary | ICD-10-CM | POA: Diagnosis not present

## 2016-07-09 DIAGNOSIS — R251 Tremor, unspecified: Secondary | ICD-10-CM | POA: Insufficient documentation

## 2016-07-09 HISTORY — PX: CATARACT EXTRACTION W/PHACO: SHX586

## 2016-07-09 SURGERY — PHACOEMULSIFICATION, CATARACT, WITH IOL INSERTION
Anesthesia: Monitor Anesthesia Care | Laterality: Left | Wound class: Clean

## 2016-07-09 MED ORDER — ACETAMINOPHEN 160 MG/5ML PO SOLN: 325.0000 mg | ORAL | Status: DC | PRN

## 2016-07-09 MED ORDER — EPINEPHRINE HCL 1 MG/ML IJ SOLN
INTRAOCULAR | Status: DC | PRN
  Administered 2016-07-09: 60 mL via OPHTHALMIC

## 2016-07-09 MED ORDER — NA HYALUR & NA CHOND-NA HYALUR 0.4-0.35 ML IO KIT
PACK | INTRAOCULAR | Status: DC | PRN
  Administered 2016-07-09: 1 mL via INTRAOCULAR

## 2016-07-09 MED ORDER — LACTATED RINGERS IV SOLN: INTRAVENOUS | Status: DC

## 2016-07-09 MED ORDER — TIMOLOL MALEATE 0.5 % OP SOLN
OPHTHALMIC | Status: DC | PRN
  Administered 2016-07-09: 1 [drp] via OPHTHALMIC

## 2016-07-09 MED ORDER — BRIMONIDINE TARTRATE 0.2 % OP SOLN
OPHTHALMIC | Status: DC | PRN
  Administered 2016-07-09: 1 [drp] via OPHTHALMIC

## 2016-07-09 MED ORDER — ARMC OPHTHALMIC DILATING GEL
1.0000 "application " | OPHTHALMIC | Status: DC | PRN
  Administered 2016-07-09 (×2): 1 via OPHTHALMIC

## 2016-07-09 MED ORDER — ACETAMINOPHEN 325 MG PO TABS: 325.0000 mg | ORAL_TABLET | ORAL | Status: DC | PRN

## 2016-07-09 MED ORDER — MIDAZOLAM HCL 2 MG/2ML IJ SOLN
INTRAMUSCULAR | Status: DC | PRN
Start: 2016-07-09 — End: 2016-07-09
  Administered 2016-07-09: 2 mg via INTRAVENOUS

## 2016-07-09 MED ORDER — TETRACAINE HCL 0.5 % OP SOLN
1.0000 [drp] | OPHTHALMIC | Status: DC | PRN
  Administered 2016-07-09: 1 [drp] via OPHTHALMIC

## 2016-07-09 MED ORDER — POVIDONE-IODINE 5 % OP SOLN
1.0000 "application " | OPHTHALMIC | Status: DC | PRN
  Administered 2016-07-09: 1 via OPHTHALMIC

## 2016-07-09 MED ORDER — BALANCED SALT IO SOLN
INTRAOCULAR | Status: DC | PRN
  Administered 2016-07-09: 1 mL via OPHTHALMIC

## 2016-07-09 MED ORDER — CEFUROXIME OPHTHALMIC INJECTION 1 MG/0.1 ML
INJECTION | OPHTHALMIC | Status: DC | PRN
  Administered 2016-07-09: 0.1 mL via OPHTHALMIC

## 2016-07-09 MED ORDER — FENTANYL CITRATE (PF) 100 MCG/2ML IJ SOLN
INTRAMUSCULAR | Status: DC | PRN
  Administered 2016-07-09: 50 ug via INTRAVENOUS

## 2016-07-09 SURGICAL SUPPLY — 25 items
CANNULA ANT/CHMB 27GA (MISCELLANEOUS) ×3 IMPLANT
CARTRIDGE ABBOTT (MISCELLANEOUS) IMPLANT
GLOVE SURG LX 7.5 STRW (GLOVE) ×2
GLOVE SURG LX STRL 7.5 STRW (GLOVE) ×1 IMPLANT
GLOVE SURG TRIUMPH 8.0 PF LTX (GLOVE) ×3 IMPLANT
GOWN STRL REUS W/ TWL LRG LVL3 (GOWN DISPOSABLE) ×2 IMPLANT
GOWN STRL REUS W/TWL LRG LVL3 (GOWN DISPOSABLE) ×4
LENS IOL TECNIS ITEC 23.5 (Intraocular Lens) ×3 IMPLANT
MARKER SKIN DUAL TIP RULER LAB (MISCELLANEOUS) ×3 IMPLANT
NDL RETROBULBAR .5 NSTRL (NEEDLE) IMPLANT
NEEDLE FILTER BLUNT 18X 1/2SAF (NEEDLE) ×2
NEEDLE FILTER BLUNT 18X1 1/2 (NEEDLE) ×1 IMPLANT
PACK CATARACT BRASINGTON (MISCELLANEOUS) ×3 IMPLANT
PACK EYE AFTER SURG (MISCELLANEOUS) ×3 IMPLANT
PACK OPTHALMIC (MISCELLANEOUS) ×3 IMPLANT
RING MALYGIN 7.0 (MISCELLANEOUS) IMPLANT
SUT ETHILON 10-0 CS-B-6CS-B-6 (SUTURE)
SUT VICRYL  9 0 (SUTURE)
SUT VICRYL 9 0 (SUTURE) IMPLANT
SUTURE EHLN 10-0 CS-B-6CS-B-6 (SUTURE) IMPLANT
SYR 3ML LL SCALE MARK (SYRINGE) ×3 IMPLANT
SYR 5ML LL (SYRINGE) ×3 IMPLANT
SYR TB 1ML LUER SLIP (SYRINGE) ×3 IMPLANT
WATER STERILE IRR 250ML POUR (IV SOLUTION) ×3 IMPLANT
WIPE NON LINTING 3.25X3.25 (MISCELLANEOUS) ×3 IMPLANT

## 2016-07-09 NOTE — Transfer of Care (Signed)
Immediate Anesthesia Transfer of Care Note  Patient: Zachary Brewer  Procedure(s) Performed: Procedure(s) with comments: CATARACT EXTRACTION PHACO AND INTRAOCULAR LENS PLACEMENT ([place]) (Left) - MALYUGIN  Patient Location: PACU  Anesthesia Type: MAC  Level of Consciousness: awake, alert  and patient cooperative  Airway and Oxygen Therapy: Patient Spontanous Breathing and Patient connected to supplemental oxygen  Post-op Assessment: Post-op Vital signs reviewed, Patient's Cardiovascular Status Stable, Respiratory Function Stable, Patent Airway and No signs of Nausea or vomiting  Post-op Vital Signs: Reviewed and stable  Complications: No apparent anesthesia complications

## 2016-07-09 NOTE — Op Note (Signed)
OPERATIVE NOTE  OC LEMMEN [id number] 07/09/2016   PREOPERATIVE DIAGNOSIS:  Nuclear sclerotic cataract left eye. H25.12   POSTOPERATIVE DIAGNOSIS:    Nuclear sclerotic cataract left eye.     PROCEDURE:  Phacoemusification with posterior chamber intraocular lens placement of the left eye   LENS:   Implant Name Type Inv. Item Serial No. Manufacturer Lot No. LRB No. Used  LENS IOL DIOP 23.5 - [id number] Intraocular Lens LENS IOL DIOP 23.5 [id number]   Left 1        ULTRASOUND TIME: 16  % of 1 minutes 7 seconds, CDE 10.7  SURGEON:  Wyonia Hough, MD   ANESTHESIA:  Topical with tetracaine drops and 2% Xylocaine jelly, augmented with 1% preservative-free intracameral lidocaine.    COMPLICATIONS:  None.   DESCRIPTION OF PROCEDURE:  The patient was identified in the holding room and transported to the operating room and placed in the supine position under the operating microscope.  The left eye was identified as the operative eye and it was prepped and draped in the usual sterile ophthalmic fashion.   A 1 millimeter clear-corneal paracentesis was made at the 1:30 position.  0.5 ml of preservative-free 1% lidocaine was injected into the anterior chamber.  The anterior chamber was filled with Viscoat viscoelastic.  A 2.4 millimeter keratome was used to make a near-clear corneal incision at the 10:30 position.  .  A curvilinear capsulorrhexis was made with a cystotome and capsulorrhexis forceps.  Balanced salt solution was used to hydrodissect and hydrodelineate the nucleus.   Phacoemulsification was then used in stop and chop fashion to remove the lens nucleus and epinucleus.  The remaining cortex was then removed using the irrigation and aspiration handpiece. Provisc was then placed into the capsular bag to distend it for lens placement.  A lens was then injected into the capsular bag.  The remaining viscoelastic was aspirated.   Wounds were hydrated with balanced salt  solution.  The anterior chamber was inflated to a physiologic pressure with balanced salt solution.  No wound leaks were noted. Cefuroxime 0.1 ml of a 10mg /ml solution was injected into the anterior chamber for a dose of 1 mg of intracameral antibiotic at the completion of the case.   Timolol and Brimonidine drops were applied to the eye.  The patient was taken to the recovery room in stable condition without complications of anesthesia or surgery.  Clifton Safley 07/09/2016, 9:01 AM

## 2016-07-09 NOTE — H&P (Signed)
The History and Physical notes are on paper, have been signed, and are to be scanned. The patient remains stable and unchanged from the H&P.   Previous H&P reviewed, patient examined, and there are no changes.  Zachary Brewer 07/09/2016 8:06 AM

## 2016-07-09 NOTE — Anesthesia Postprocedure Evaluation (Signed)
Anesthesia Post Note  Patient: Zachary Brewer  Procedure(s) Performed: Procedure(s) (LRB): CATARACT EXTRACTION PHACO AND INTRAOCULAR LENS PLACEMENT ([facility]) (Left)  Patient location during evaluation: PACU Anesthesia Type: MAC Level of consciousness: awake and alert and oriented Pain management: satisfactory to patient Vital Signs Assessment: post-procedure vital signs reviewed and stable Respiratory status: spontaneous breathing, nonlabored ventilation and respiratory function stable Cardiovascular status: blood pressure returned to baseline and stable Postop Assessment: Adequate PO intake and No signs of nausea or vomiting Anesthetic complications: no    Raliegh Ip

## 2016-07-09 NOTE — Anesthesia Preprocedure Evaluation (Signed)
Anesthesia Evaluation  Patient identified by MRN, date of birth, ID band Patient awake    Reviewed: Allergy & Precautions, H&P , NPO status , Patient's Chart, lab work & pertinent test results  Airway Mallampati: II  TM Distance: >3 FB Neck ROM: full    Dental  (+) Edentulous Lower, Upper Dentures   Pulmonary asthma ,    Pulmonary exam normal        Cardiovascular hypertension, Normal cardiovascular exam     Neuro/Psych    GI/Hepatic GERD  ,  Endo/Other    Renal/GU      Musculoskeletal   Abdominal   Peds  Hematology   Anesthesia Other Findings   Reproductive/Obstetrics                             Anesthesia Physical Anesthesia Plan  ASA: II  Anesthesia Plan: MAC   Post-op Pain Management:    Induction:   Airway Management Planned:   Additional Equipment:   Intra-op Plan:   Post-operative Plan:   Informed Consent: I have reviewed the patients History and Physical, chart, labs and discussed the procedure including the risks, benefits and alternatives for the proposed anesthesia with the patient or authorized representative who has indicated his/her understanding and acceptance.     Plan Discussed with:   Anesthesia Plan Comments:         Anesthesia Quick Evaluation

## 2016-07-09 NOTE — Anesthesia Procedure Notes (Signed)
Procedure Name: MAC Performed by: Akeisha Lagerquist Pre-anesthesia Checklist: Patient identified, Emergency Drugs available, Suction available, Timeout performed and Patient being monitored Patient Re-evaluated:Patient Re-evaluated prior to inductionOxygen Delivery Method: Nasal cannula Placement Confirmation: positive ETCO2       

## 2016-07-10 ENCOUNTER — Encounter: Payer: Self-pay | Admitting: Ophthalmology

## 2016-07-24 DIAGNOSIS — Z79891 Long term (current) use of opiate analgesic: Secondary | ICD-10-CM | POA: Diagnosis not present

## 2016-07-24 DIAGNOSIS — M1180 Other specified crystal arthropathies, unspecified site: Secondary | ICD-10-CM | POA: Diagnosis not present

## 2016-07-24 DIAGNOSIS — E559 Vitamin D deficiency, unspecified: Secondary | ICD-10-CM | POA: Diagnosis not present

## 2016-07-24 DIAGNOSIS — Z7952 Long term (current) use of systemic steroids: Secondary | ICD-10-CM | POA: Diagnosis not present

## 2016-07-25 ENCOUNTER — Telehealth: Payer: Self-pay | Admitting: Family Medicine

## 2016-07-25 NOTE — Telephone Encounter (Signed)
Pt needs refills on omeprazole 20 mg and tamsulosin 0.4 mg sent to [facility].  Pamela's call back number is [phone]

## 2016-07-28 NOTE — Telephone Encounter (Signed)
Spoke with patient's daughter, patient switched pharmacy. I told her that old rx is at [facility] and they can have rx transferred or filled at [facility].

## 2016-07-28 NOTE — Telephone Encounter (Signed)
Send refill of both, 90 day supply with 3 refills.  Tell him to keep f/u appt next month.-jh

## 2016-08-06 IMAGING — CR DG KNEE COMPLETE 4+V*L*
4 series · 4 of 4 positions shown · non-contrast
Comparison: None in PACs

CLINICAL DATA: Left knee pain and swelling, unable to extend the
knee, difficult to wall cor bear weight.

EXAM:
LEFT KNEE - COMPLETE 4+ VIEW

[knee ap]
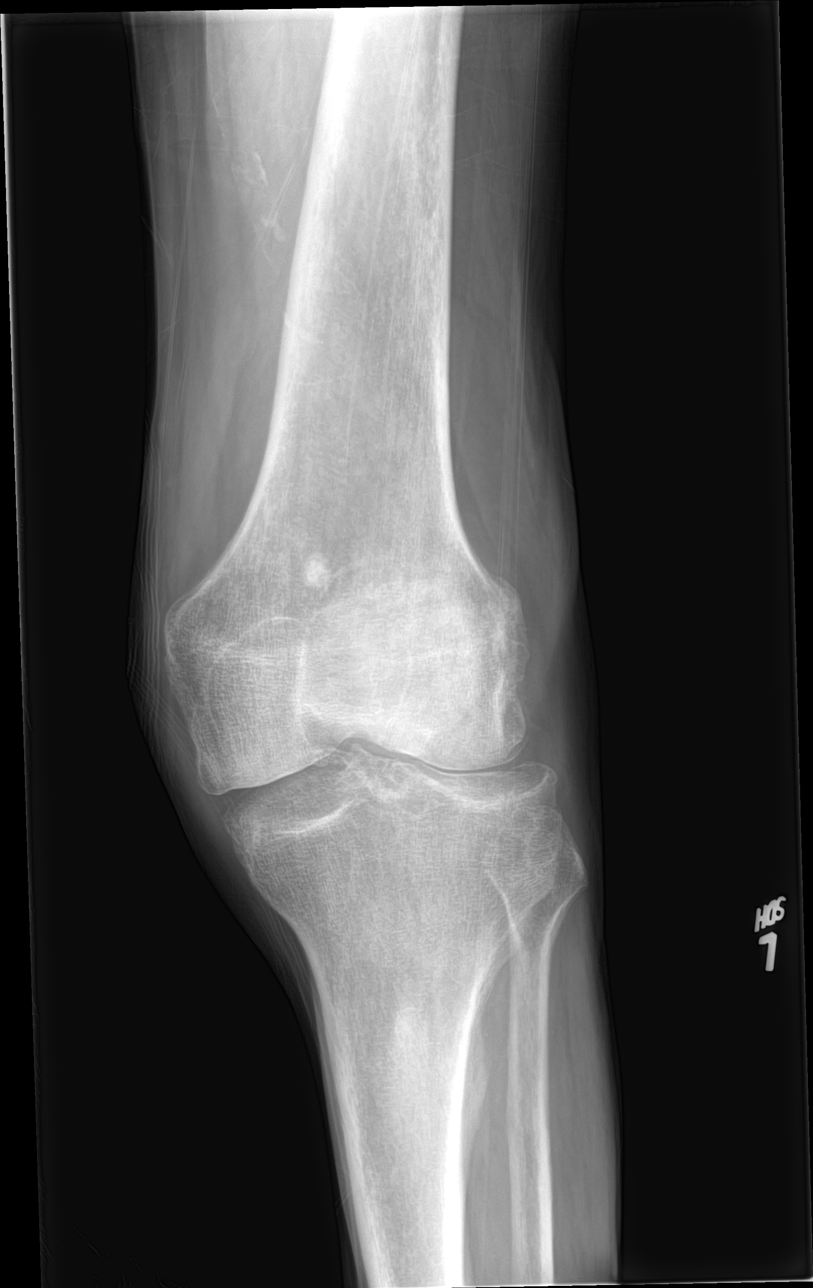

[knee obl (1 of 2)]
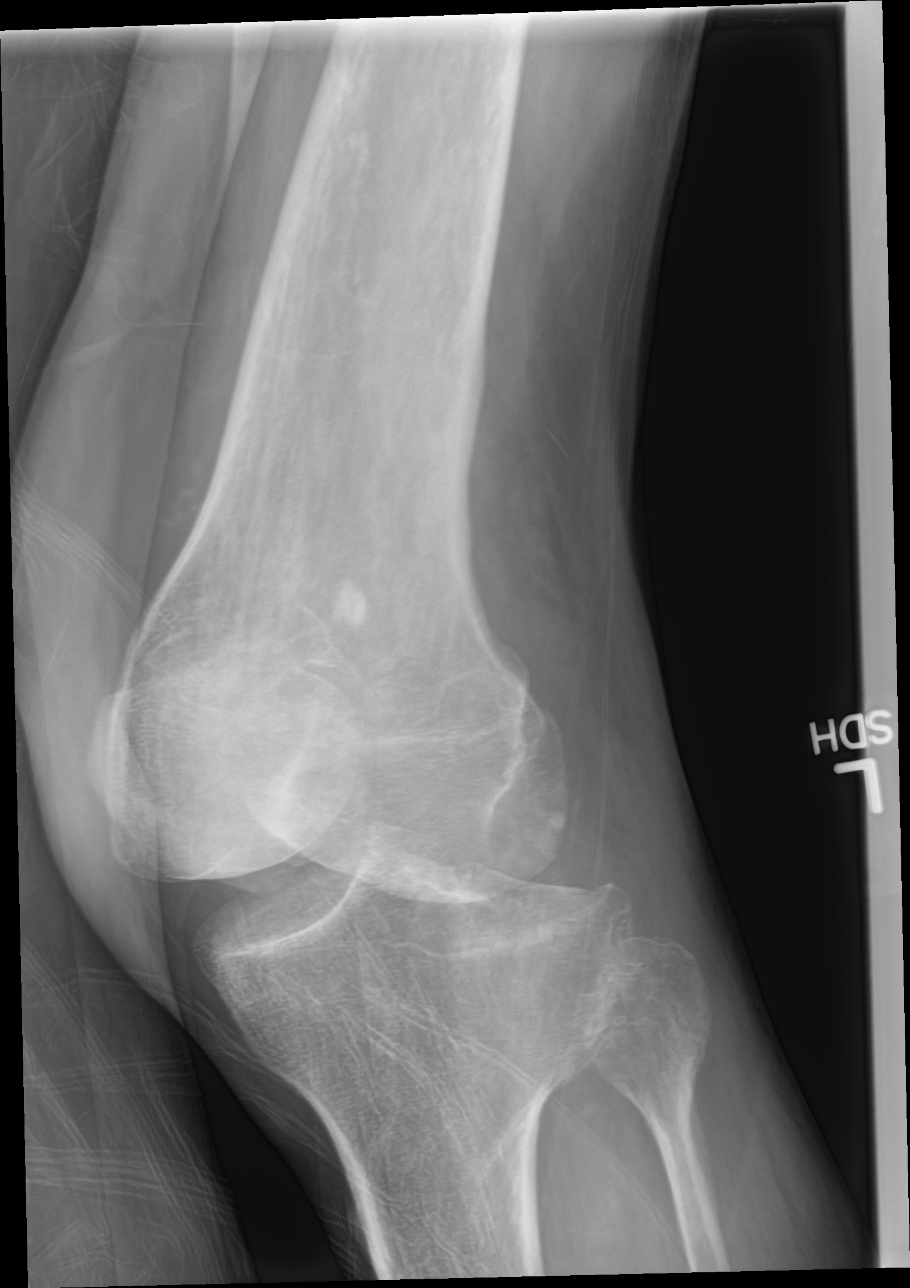

[knee obl (2 of 2)]
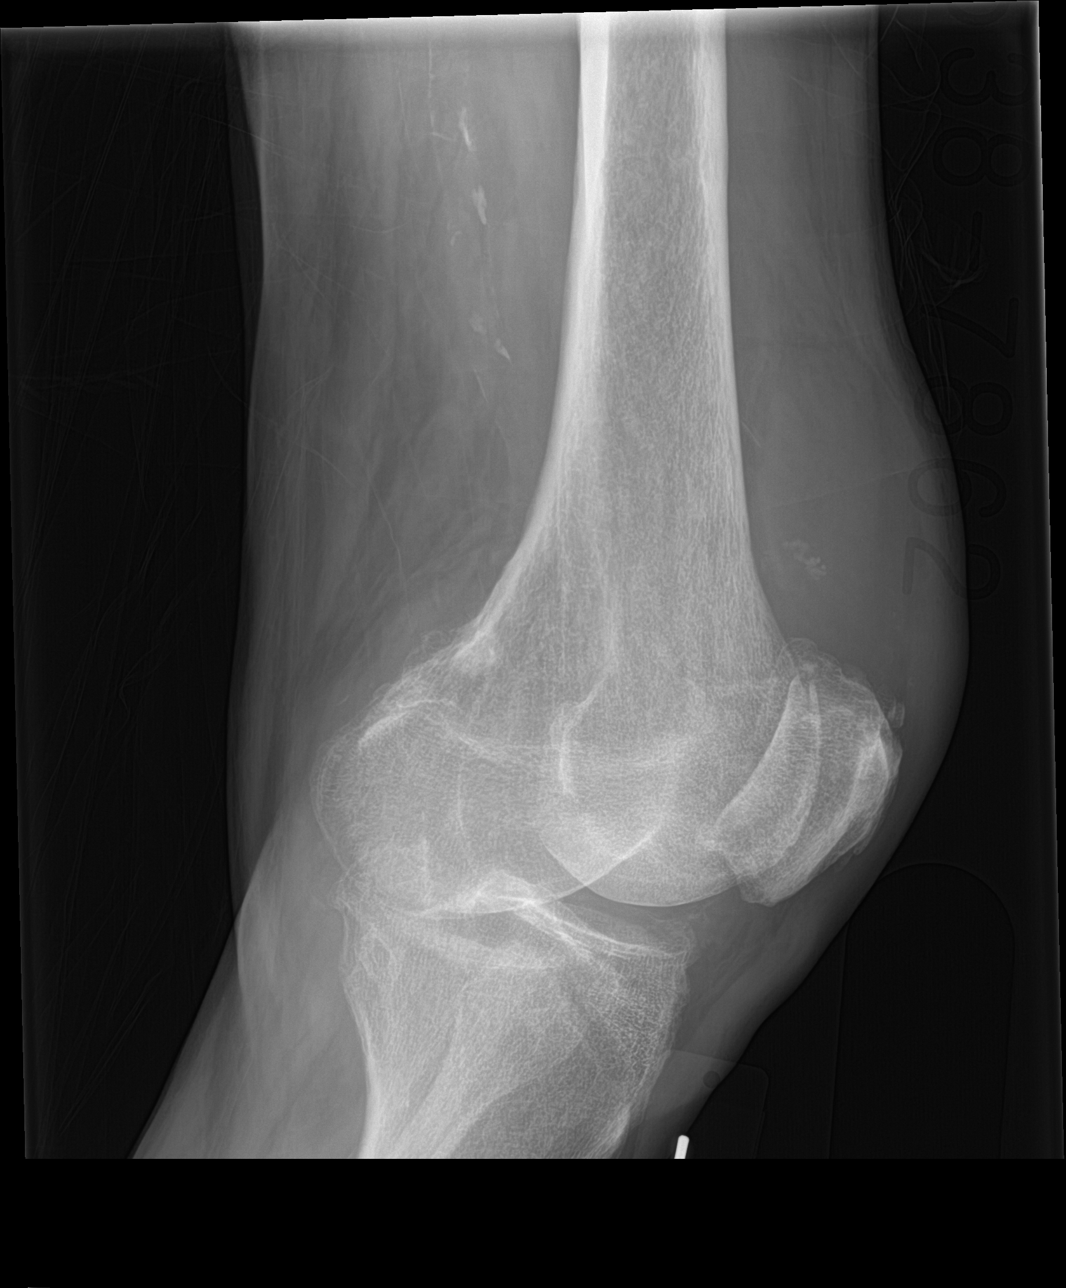

[knee lat]
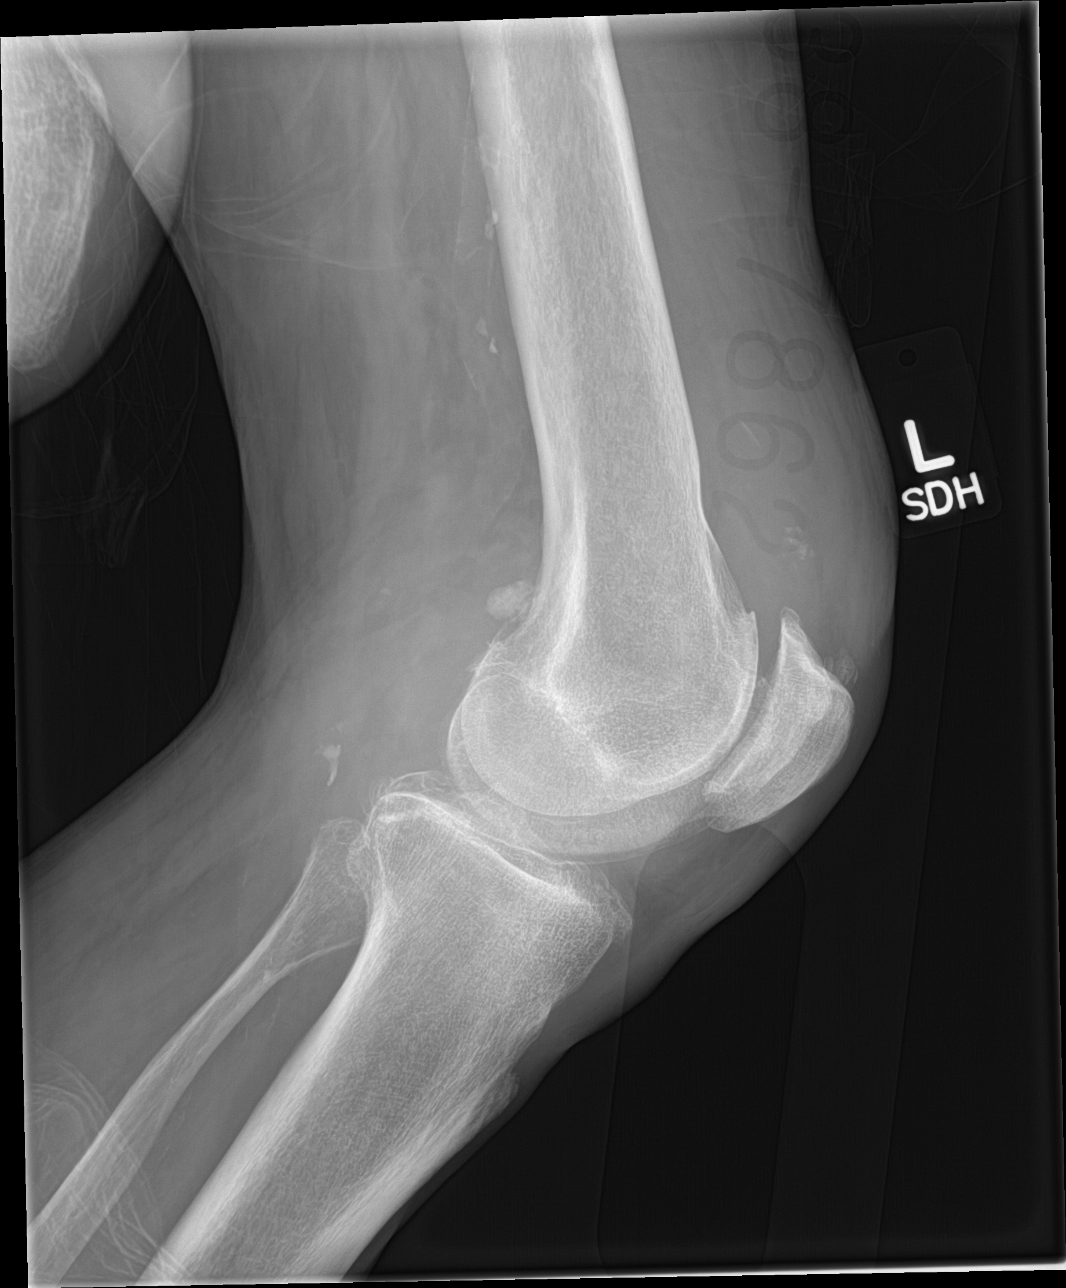

[4 of 4 positions shown; findings below may reference images not displayed]

FINDINGS: The bones of the left knee are osteopenic. There appears to be mild
lateral subluxation of the proximal tibia with respect to the
femoral condyles. The joint spaces are reasonably well-maintained.
There is beaking of the medial tibial spine. There are osteophytes
arising from the articular margins of the patella. There is a
moderate-sized joint effusion in the suprapatellar and popliteal
regions. The proximal fibula is intact.
IMPRESSION: There is a large joint effusion in the suprapatellar as well as
popliteal regions. There are mild to moderate osteoarthritic changes
centered chiefly on the patellofemoral compartment but also in the
medial and lateral compartments.

## 2016-08-19 DIAGNOSIS — Z961 Presence of intraocular lens: Secondary | ICD-10-CM | POA: Diagnosis not present

## 2016-09-05 IMAGING — CR DG CHEST 2V
1 series · 3 of 3 positions shown · non-contrast
Comparison: 06/20/2015

CLINICAL DATA: Pneumonia

EXAM:
CHEST  2 VIEW

[Series 1: pa · 0.17mm/px · 3 of 3 slices shown]
[im 1/3]
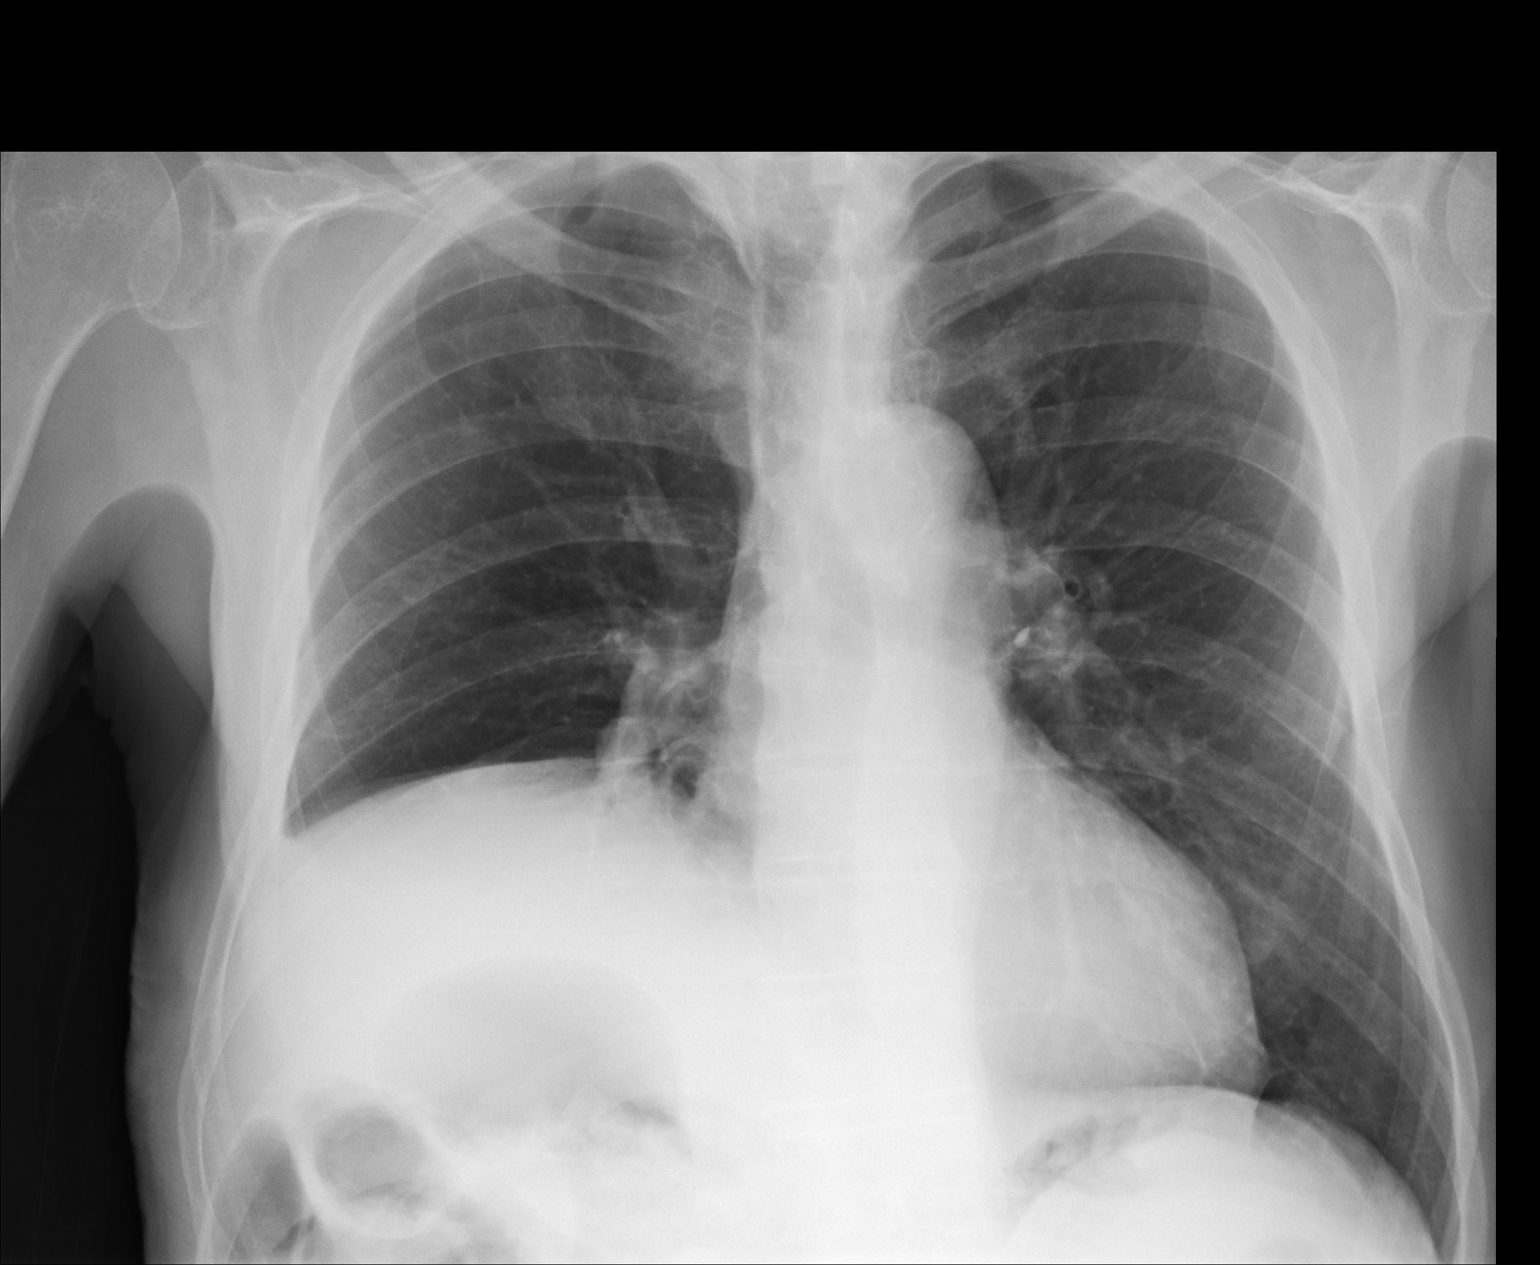
[im 2/3]
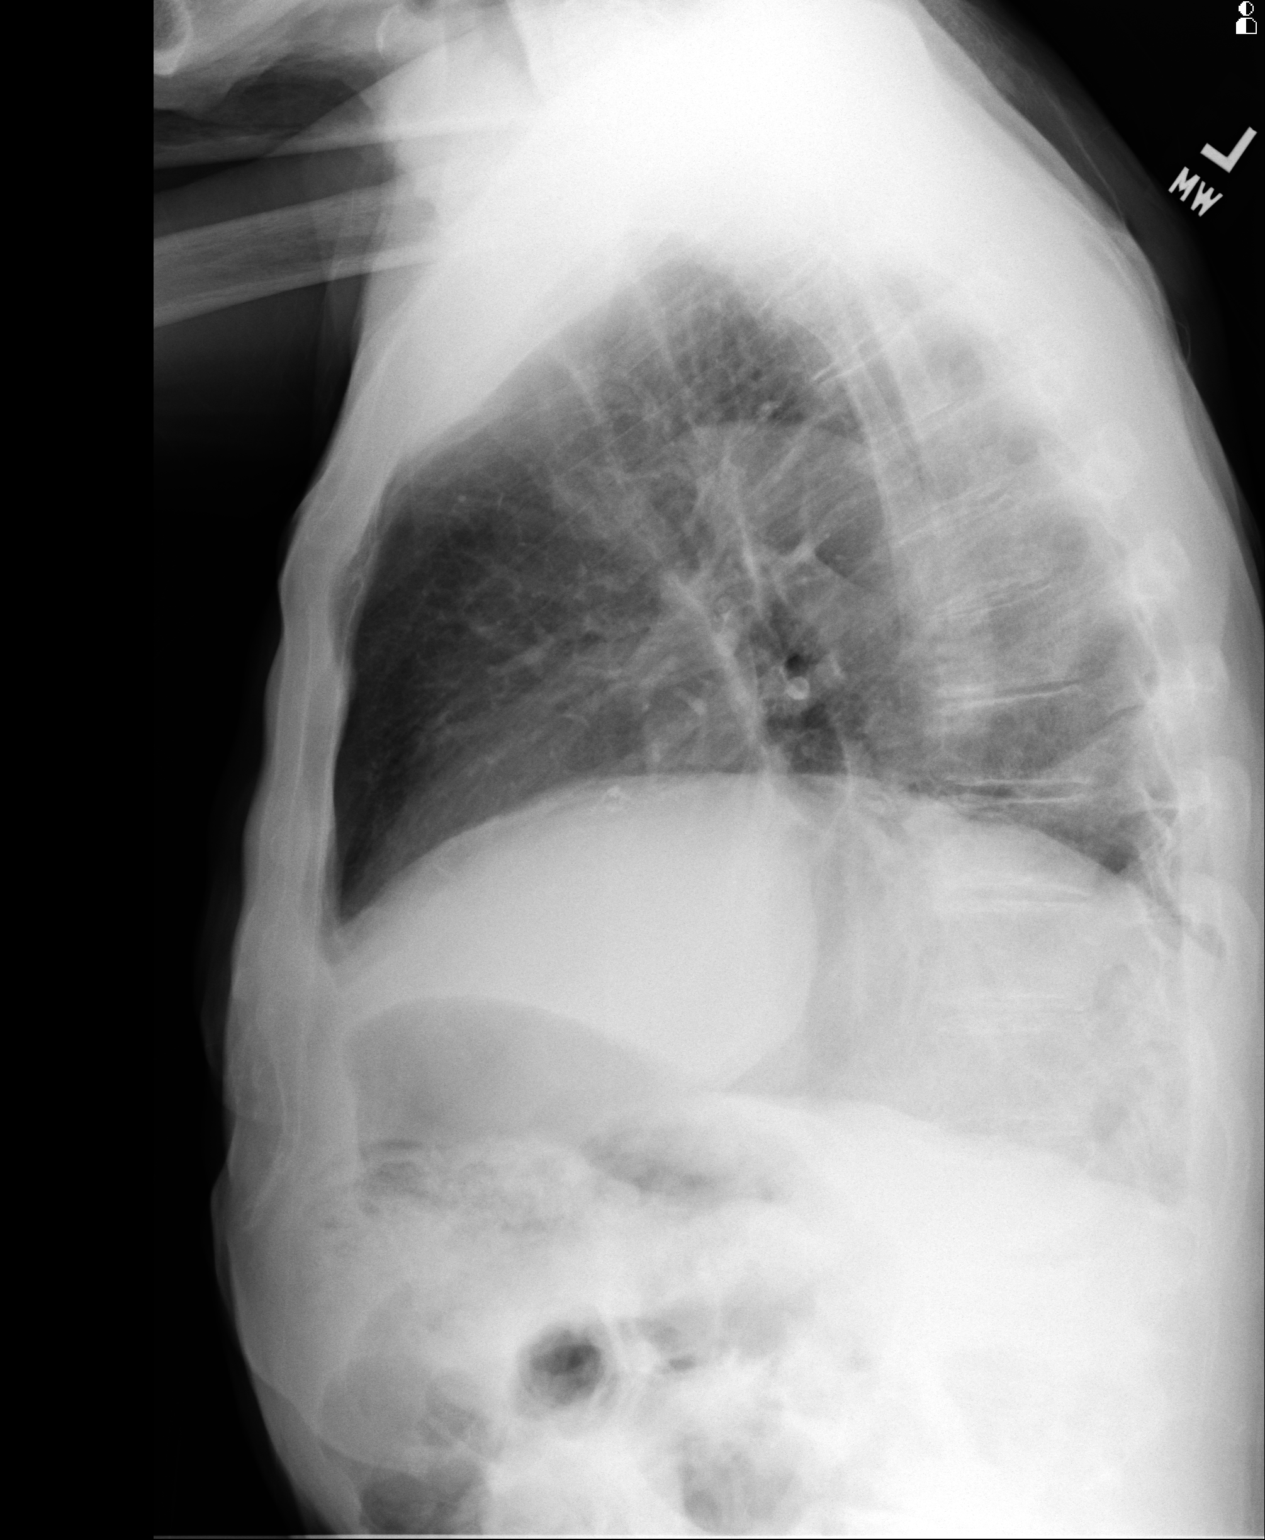
[im 3/3]
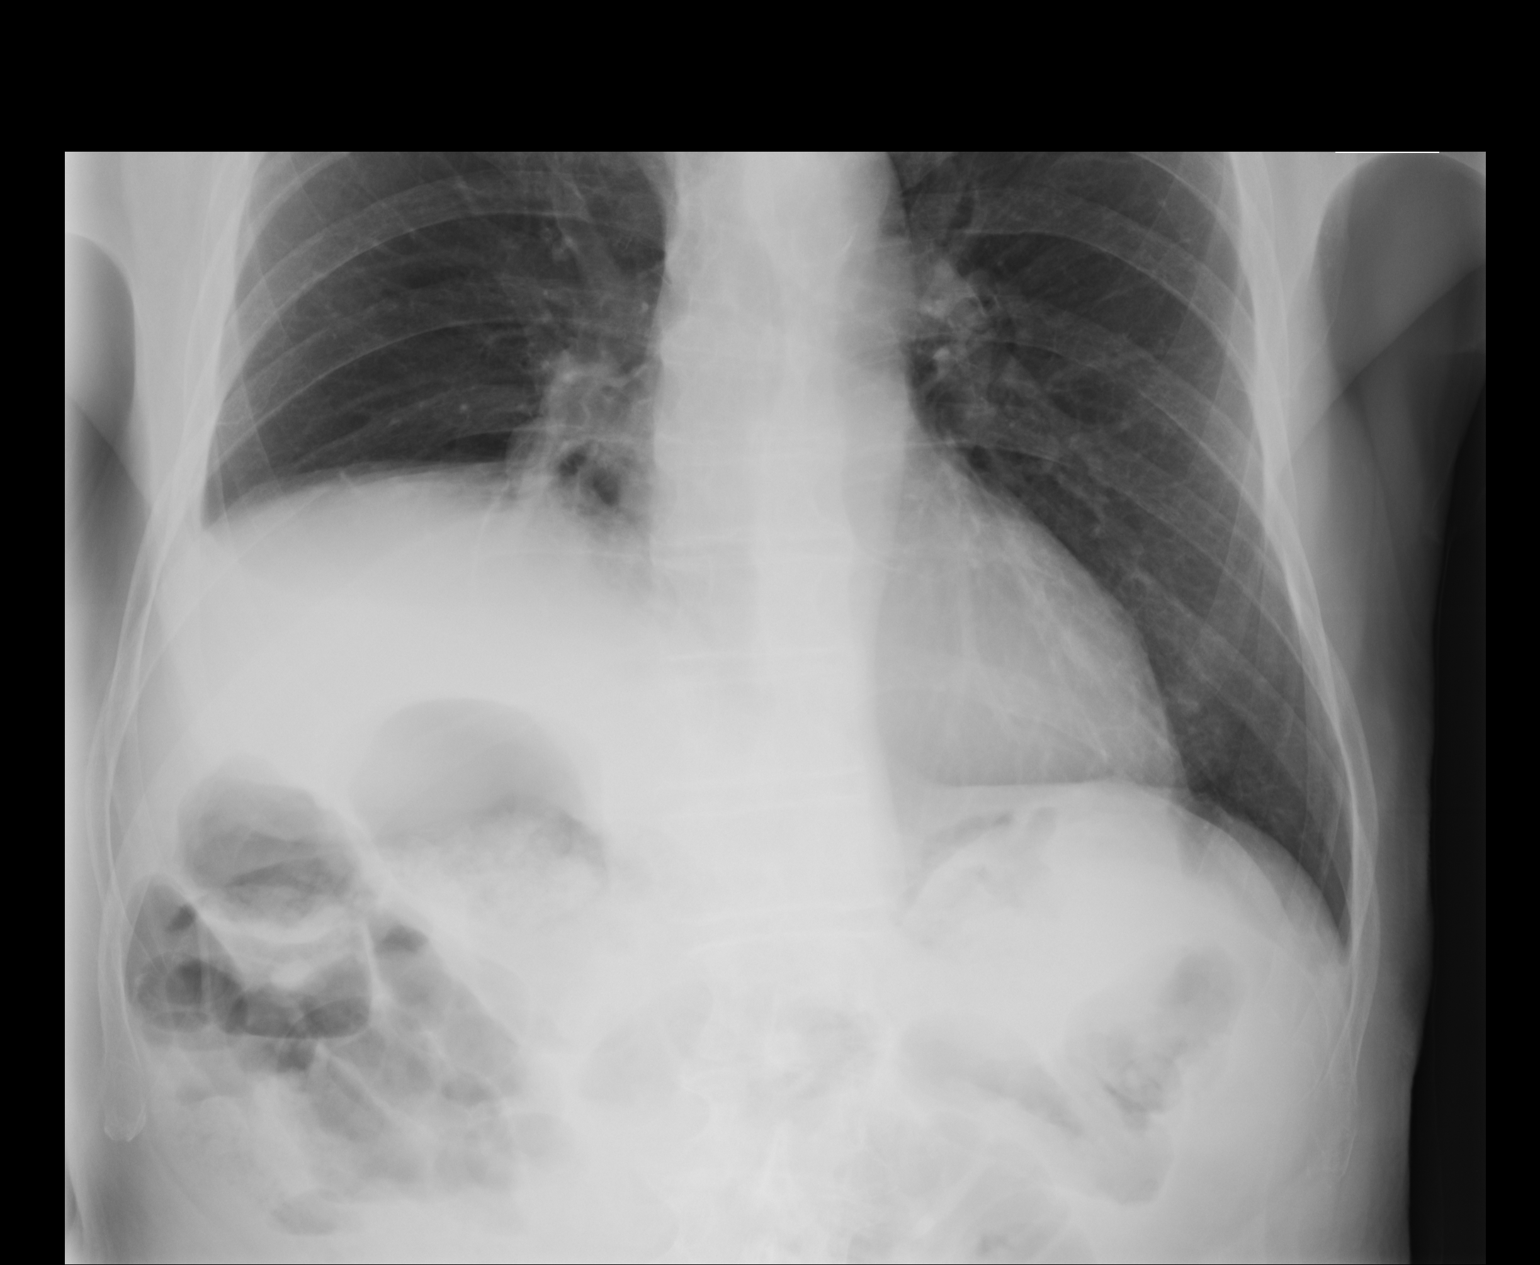

[3 of 3 positions shown; findings below may reference images not displayed]

FINDINGS: Cardiomediastinal silhouette is stable. There is chronic elevation
of the right hemidiaphragm. No acute infiltrate or pulmonary edema.
Mild right basilar atelectasis.
IMPRESSION: No infiltrate or pulmonary edema. Chronic elevation of the right
hemidiaphragm with right basilar atelectasis.

## 2016-09-09 ENCOUNTER — Other Ambulatory Visit: Payer: Self-pay | Admitting: *Deleted

## 2016-09-09 ENCOUNTER — Encounter: Payer: Self-pay | Admitting: Family Medicine

## 2016-09-09 ENCOUNTER — Ambulatory Visit (INDEPENDENT_AMBULATORY_CARE_PROVIDER_SITE_OTHER): Payer: Medicare Other | Admitting: Family Medicine

## 2016-09-09 VITALS — BP 139/73 | HR 72 | Temp 98.1°F | Resp 16 | Ht 67.0 in | Wt 148.0 lb

## 2016-09-09 DIAGNOSIS — R739 Hyperglycemia, unspecified: Secondary | ICD-10-CM | POA: Diagnosis not present

## 2016-09-09 DIAGNOSIS — I10 Essential (primary) hypertension: Secondary | ICD-10-CM

## 2016-09-09 DIAGNOSIS — E785 Hyperlipidemia, unspecified: Secondary | ICD-10-CM

## 2016-09-09 DIAGNOSIS — M1990 Unspecified osteoarthritis, unspecified site: Secondary | ICD-10-CM

## 2016-09-09 DIAGNOSIS — N400 Enlarged prostate without lower urinary tract symptoms: Secondary | ICD-10-CM | POA: Diagnosis not present

## 2016-09-09 DIAGNOSIS — Z23 Encounter for immunization: Secondary | ICD-10-CM | POA: Diagnosis not present

## 2016-09-09 DIAGNOSIS — K2100 Gastro-esophageal reflux disease with esophagitis, without bleeding: Secondary | ICD-10-CM

## 2016-09-09 MED ORDER — ENALAPRIL MALEATE 20 MG PO TABS: ORAL_TABLET | ORAL | 3 refills | Status: DC

## 2016-09-09 MED ORDER — OMEPRAZOLE 20 MG PO CPDR
20.0000 mg | DELAYED_RELEASE_CAPSULE | Freq: Two times a day (BID) | ORAL | 3 refills | Status: DC
Start: 2016-09-09 — End: 2017-04-02

## 2016-09-09 MED ORDER — COLCHICINE 0.6 MG PO TABS: 0.6000 mg | ORAL_TABLET | Freq: Every day | ORAL | 6 refills | Status: DC

## 2016-09-09 MED ORDER — TAMSULOSIN HCL 0.4 MG PO CAPS: 0.4000 mg | ORAL_CAPSULE | Freq: Every day | ORAL | 3 refills | Status: DC

## 2016-09-09 MED ORDER — GLUCOSE BLOOD VI STRP: ORAL_STRIP | 12 refills | Status: DC

## 2016-09-09 NOTE — Progress Notes (Signed)
Name: Zachary Brewer   MRN: [id number]    DOB: 29-Jan-1941   Date:09/09/2016       Progress Note  Subjective  Chief Complaint  Chief Complaint  Patient presents with  . Follow-up    Hypertension and Arthritis    HPI Here for f/u of HBP and arthritis.  He is tapering off Prednisone and getting off Opoids and plans to take Tylenol for pain if needed.  Overal doing well.  BPs at home 120s-130s sys.  BSs are 90-105.  He is eating better.  No problem-specific Assessment & Plan notes found for this encounter.   Past Medical History:  Diagnosis Date  . Allergy   . Arthritis 05/30/2015   Gout - Left knee and ankle  . Cervical pain 05/30/2015  . GERD (gastroesophageal reflux disease)   . Hyperlipidemia   . Hypertension   . 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 Wears dentures    full upper and lower (doesn't wear lower)  . Wears hearing aid    bilateral    Past Surgical History:  Procedure Laterality Date  . CATARACT EXTRACTION W/PHACO Right 06/11/2016   Procedure: CATARACT EXTRACTION PHACO AND INTRAOCULAR LENS PLACEMENT ([place]);  Surgeon: Leandrew Koyanagi, MD;  Location: [facility];  Service: Ophthalmology;  Laterality: Right;  . CATARACT EXTRACTION W/PHACO Left 07/09/2016   Procedure: CATARACT EXTRACTION PHACO AND INTRAOCULAR LENS PLACEMENT ([facility]);  Surgeon: Leandrew Koyanagi, MD;  Location: [facility];  Service: Ophthalmology;  Laterality: Left;  MALYUGIN  . ESOPHAGOGASTRODUODENOSCOPY (EGD) WITH PROPOFOL N/A 06/20/2015   Procedure: ESOPHAGOGASTRODUODENOSCOPY (EGD) WITH PROPOFOL with dialation;  Surgeon: Lucilla Lame, MD;  Location: [facility];  Service: Endoscopy;  Laterality: N/A;  . FLEXIBLE BRONCHOSCOPY N/A 06/22/2015   Procedure: FLEXIBLE BRONCHOSCOPY;  Surgeon: Allyne Gee, MD;  Location: [facility];  Service: Pulmonary;  Laterality: N/A;  . GASTRIC OUTLET OBSTRUCTION RELEASE  2013  . 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  . UPPER GI ENDOSCOPY      Family History   Problem Relation Age of Onset  . Diabetes Mother   . Heart disease Father   . Diabetes Sister   . Diabetes Brother   . Stroke Brother   . Mental illness Brother   . Hypertension Paternal Grandfather     Social History   Social History  . Marital status: Widowed    Spouse name: N/A  . Number of children: N/A  . Years of education: N/A   Occupational History  . Not on file.   Social History Main Topics  . Smoking status: Never Smoker  . Smokeless tobacco: Never Used  . Alcohol use No  . Drug use: No  . Sexual activity: Not on file   Other Topics Concern  . Not on file   Social History Narrative  . No narrative on file     Current Outpatient Prescriptions:  .  Ascorbic Acid (VITAMIN C PO), Take by mouth daily., Disp: , Rfl:  .  Blood Glucose Monitoring Suppl (ACCU-CHEK AVIVA) device, Use as instructed Dx: E11.9, Disp: 1 each, Rfl: 0 .  Calcium 500-100 MG-UNIT CHEW, CHEW 1 TABLET BY MOUTH TWICE DAILY WITH A MEAL, Disp: , Rfl: 11 .  colchicine 0.6 MG tablet, Take 1 tablet (0.6 mg total) by mouth daily., Disp: 30 tablet, Rfl: 6 .  docusate sodium (COLACE) 100 MG capsule, Take 100 mg by mouth daily as needed for mild constipation. Reported on 12/24/2015, Disp: , Rfl:  .  enalapril (VASOTEC) 20 MG  tablet, Take 1.5 tablets each AM for BP., Disp: 135 tablet, Rfl: 3 .  glucose blood (ACCU-CHEK AVIVA PLUS) test strip, Check blood sugar twice daily. Dx. E11.9, Disp: 100 each, Rfl: 12 .  Lancets (ACCU-CHEK MULTICLIX) lancets, Check blood sugar twice daily. E11.9, Disp: 100 each, Rfl: 12 .  Multiple Vitamin (MULTIVITAMIN) tablet, Take 1 tablet by mouth daily. Reported on 11/21/2015, Disp: , Rfl:  .  omeprazole (PRILOSEC) 20 MG capsule, Take 1 capsule (20 mg total) by mouth 2 (two) times daily before a meal., Disp: 180 capsule, Rfl: 3 .  oxyCODONE-acetaminophen (ROXICET) 5-325 MG tablet, Limit one half to one tab by mouth per day or 2  times per day if tolerated, Disp: 50 tablet, Rfl:  0 .  predniSONE (DELTASONE) 1 MG tablet, Take 0.5 mg by mouth daily with breakfast. , Disp: , Rfl:  .  tamsulosin (FLOMAX) 0.4 MG CAPS capsule, Take 1 capsule (0.4 mg total) by mouth daily after breakfast., Disp: 90 capsule, Rfl: 3 .  Vitamin D, Ergocalciferol, (DRISDOL) 50000 UNITS CAPS capsule, Take 50,000 Units by mouth once a week. Reported on 02/14/2016, Disp: , Rfl: 0  Not on File   Review of Systems  Constitutional: Negative for chills, fever, malaise/fatigue and weight loss.  HENT: Negative for hearing loss and tinnitus.   Eyes: Negative for blurred vision and double vision.  Respiratory: Negative for hemoptysis, shortness of breath and wheezing.   Cardiovascular: Negative for chest pain, palpitations and leg swelling.  Gastrointestinal: Negative for abdominal pain, blood in stool and heartburn.  Genitourinary: Negative for dysuria, frequency and urgency.  Musculoskeletal: Positive for joint pain (diffuse). Negative for myalgias.  Skin: Negative for rash.  Neurological: Negative for dizziness, tingling, tremors, weakness and headaches.      Objective  Vitals:   09/09/16 0917  BP: 139/73  Pulse: 72  Resp: 16  Temp: 98.1 F (36.7 C)  TempSrc: Oral  Weight: 148 lb (67.1 kg)  Height: 5\' 7"  (1.702 m)    Physical Exam  Constitutional: He is oriented to person, place, and time and well-developed, well-nourished, and in no distress. No distress.  HENT:  Head: Normocephalic and atraumatic.  Eyes: Conjunctivae and EOM are normal. Pupils are equal, round, and reactive to light. No scleral icterus.  Neck: Normal range of motion. Neck supple. Carotid bruit is not present. No thyromegaly present.  Cardiovascular: Normal rate, regular rhythm and normal heart sounds.  Exam reveals no gallop and no friction rub.   No murmur heard. Pulmonary/Chest: Effort normal and breath sounds normal. No respiratory distress. He has no wheezes. He has no rales.  Abdominal: Soft. Bowel sounds are  normal. He exhibits no distension and no mass. There is no tenderness.  Musculoskeletal: He exhibits no edema.  Lymphadenopathy:    He has no cervical adenopathy.  Neurological: He is alert and oriented to person, place, and time.  Vitals reviewed.      No results found for this or any previous visit (from the past 2160 hour(s)).   Assessment & Plan  Problem List Items Addressed This Visit      Cardiovascular and Mediastinum   Essential (primary) hypertension   Relevant Medications   enalapril (VASOTEC) 20 MG tablet   Other Relevant Orders   COMPLETE METABOLIC PANEL WITH GFR     Digestive   Esophagitis, reflux   Relevant Medications   omeprazole (PRILOSEC) 20 MG capsule   Other Relevant Orders   CBC with Differential     Musculoskeletal  and Integument   Arthritis   Relevant Medications   colchicine 0.6 MG tablet     Genitourinary   BPH (benign prostatic hyperplasia)   Relevant Medications   tamsulosin (FLOMAX) 0.4 MG CAPS capsule     Other   Blood glucose elevated   Relevant Orders   HgB A1c   Hyperlipidemia   Relevant Medications   enalapril (VASOTEC) 20 MG tablet   Other Relevant Orders   Lipid Profile    Other Visit Diagnoses    Need for vaccination    -  Primary   Relevant Orders   Flu vaccine HIGH DOSE PF (Fluzone High dose) (Completed)      Meds ordered this encounter  Medications  . tamsulosin (FLOMAX) 0.4 MG CAPS capsule    Sig: Take 1 capsule (0.4 mg total) by mouth daily after breakfast.    Dispense:  90 capsule    Refill:  3  . omeprazole (PRILOSEC) 20 MG capsule    Sig: Take 1 capsule (20 mg total) by mouth 2 (two) times daily before a meal.    Dispense:  180 capsule    Refill:  3  . enalapril (VASOTEC) 20 MG tablet    Sig: Take 1.5 tablets each AM for BP.    Dispense:  135 tablet    Refill:  3  . colchicine 0.6 MG tablet    Sig: Take 1 tablet (0.6 mg total) by mouth daily.    Dispense:  30 tablet    Refill:  6   1. Need for  vaccination  - Flu vaccine HIGH DOSE PF (Fluzone High dose)  2. Essential (primary) hypertension  - COMPLETE METABOLIC PANEL WITH GFR - enalapril (VASOTEC) 20 MG tablet; Take 1.5 tablets each AM for BP.  Dispense: 135 tablet; Refill: 3  3. Esophagitis, reflux  - CBC with Differential - omeprazole (PRILOSEC) 20 MG capsule; Take 1 capsule (20 mg total) by mouth 2 (two) times daily before a meal.  Dispense: 180 capsule; Refill: 3  4. Benign prostatic hyperplasia without lower urinary tract symptoms  - tamsulosin (FLOMAX) 0.4 MG CAPS capsule; Take 1 capsule (0.4 mg total) by mouth daily after breakfast.  Dispense: 90 capsule; Refill: 3  5. Hyperlipidemia, unspecified hyperlipidemia type  - Lipid Profile  6. Blood glucose elevated  - HgB A1c  7. Arthritis  - colchicine 0.6 MG tablet; Take 1 tablet (0.6 mg total) by mouth daily.  Dispense: 30 tablet; Refill: 6

## 2016-09-10 ENCOUNTER — Other Ambulatory Visit: Payer: Medicare Other

## 2016-09-10 LAB — COMPLETE METABOLIC PANEL WITH GFR
ALT: 13 U/L (ref 9–46)
AST: 19 U/L (ref 10–35)
Albumin: 4.2 g/dL (ref 3.6–5.1)
Alkaline Phosphatase: 89 U/L (ref 40–115)
BUN: 14 mg/dL (ref 7–25)
CHLORIDE: 101 mmol/L (ref 98–110)
CO2: 29 mmol/L (ref 20–31)
CREATININE: 0.66 mg/dL — AB (ref 0.70–1.18)
Calcium: 9.5 mg/dL (ref 8.6–10.3)
GFR, Est African American: >89 mL/min (ref >=60)
GFR, Est Non African American: >89 mL/min (ref >=60)
Glucose, Bld: 99 mg/dL (ref 65–99)
Potassium: 3.9 mmol/L (ref 3.5–5.3)
Sodium: 140 mmol/L (ref 135–146)
Total Bilirubin: 1.2 mg/dL (ref 0.2–1.2)
Total Protein: 6.3 g/dL (ref 6.1–8.1)

## 2016-09-10 LAB — CBC WITH DIFFERENTIAL/PLATELET
BASOS ABS: 0 {cells}/uL (ref 0–200)
BASOS PCT: 0 %
EOS ABS: 0 {cells}/uL — AB (ref 15–500)
Eosinophils Relative: 0 %
HEMATOCRIT: 46.1 % (ref 38.5–50.0)
Hemoglobin: 15.2 g/dL (ref 13.2–17.1)
LYMPHS PCT: 16 %
Lymphs Abs: 864 cells/uL (ref 850–3900)
MCH: 31.5 pg (ref 27.0–33.0)
MCHC: 33 g/dL (ref 32.0–36.0)
MCV: 95.4 fL (ref 80.0–100.0)
MONO ABS: 324 {cells}/uL (ref 200–950)
MPV: 9.8 fL (ref 7.5–12.5)
Monocytes Relative: 6 %
Neutro Abs: 4212 cells/uL (ref 1500–7800)
Neutrophils Relative %: 78 %
Platelets: 163 10*3/uL (ref 140–400)
RBC: 4.83 MIL/uL (ref 4.20–5.80)
RDW: 13.5 % (ref 11.0–15.0)
WBC: 5.4 10*3/uL (ref 3.8–10.8)

## 2016-09-10 LAB — LIPID PANEL
Cholesterol: 163 mg/dL (ref <200)
HDL: 43 mg/dL (ref >40)
LDL CALC: 92 mg/dL (ref <100)
Total CHOL/HDL Ratio: 3.8 Ratio (ref <5.0)
Triglycerides: 141 mg/dL (ref <150)
VLDL: 28 mg/dL (ref <30)

## 2016-09-11 LAB — HEMOGLOBIN A1C
HEMOGLOBIN A1C: 5.2 % (ref <5.7)
Mean Plasma Glucose: 103 mg/dL

## 2016-10-15 ENCOUNTER — Ambulatory Visit: Payer: Medicare Other | Admitting: Pain Medicine

## 2016-11-05 ENCOUNTER — Ambulatory Visit: Payer: Medicare Other | Admitting: Pain Medicine

## 2016-12-04 ENCOUNTER — Encounter: Payer: Self-pay | Admitting: Family Medicine

## 2016-12-04 ENCOUNTER — Ambulatory Visit (INDEPENDENT_AMBULATORY_CARE_PROVIDER_SITE_OTHER): Payer: Medicare Other | Admitting: Family Medicine

## 2016-12-04 ENCOUNTER — Other Ambulatory Visit: Payer: Self-pay | Admitting: *Deleted

## 2016-12-04 ENCOUNTER — Encounter: Payer: Self-pay | Admitting: *Deleted

## 2016-12-04 VITALS — BP 152/64 | HR 64 | Temp 98.5°F | Resp 16 | Ht 67.0 in | Wt 152.0 lb

## 2016-12-04 DIAGNOSIS — K2100 Gastro-esophageal reflux disease with esophagitis, without bleeding: Secondary | ICD-10-CM

## 2016-12-04 DIAGNOSIS — R197 Diarrhea, unspecified: Secondary | ICD-10-CM

## 2016-12-04 NOTE — Progress Notes (Signed)
Name: Zachary Brewer   MRN: [id number]    DOB: [birth year]   Date:12/04/2016       Progress Note  Subjective  Chief Complaint  Chief Complaint  Patient presents with  . Diarrhea    HPI Here c/o intermittant loose stools.  Will have 2-3 in a day occ, but then have have normal BMs for a day or 2, then comes back again.  No abdominal pain or fever.  No blood passed.  Normal color.  No problem-specific Assessment & Plan notes found for this encounter.   Past Medical History:  Diagnosis Date  . Allergy   . Arthritis 05/30/2015   Gout - Left knee and ankle  . Cervical pain 05/30/2015  . GERD (gastroesophageal reflux disease)   . Hyperlipidemia   . Hypertension   . 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 Wears dentures    full upper and lower (doesn't wear lower)  . Wears hearing aid    bilateral    Social History  Substance Use Topics  . Smoking status: Never Smoker  . Smokeless tobacco: Never Used  . Alcohol use No     Current Outpatient Prescriptions:  .  aspirin EC 81 MG tablet, Take 81 mg by mouth daily., Disp: , Rfl:  .  Blood Glucose Monitoring Suppl (ACCU-CHEK AVIVA) device, Use as instructed Dx: E11.9, Disp: 1 each, Rfl: 0 .  colchicine 0.6 MG tablet, Take 1 tablet (0.6 mg total) by mouth daily., Disp: 30 tablet, Rfl: 6 .  docusate sodium (COLACE) 100 MG capsule, Take 100 mg by mouth daily as needed for mild constipation. Reported on 12/24/2015, Disp: , Rfl:  .  enalapril (VASOTEC) 20 MG tablet, Take 1.5 tablets each AM for BP., Disp: 135 tablet, Rfl: 3 .  Glucosamine-Chondroit-Vit C-Mn (GLUCOSAMINE 1500 COMPLEX PO), Take 1,500 mg by mouth daily., Disp: , Rfl:  .  glucose blood (ACCU-CHEK AVIVA PLUS) test strip, Check blood sugar twice daily. Dx. E11.9, Disp: 100 each, Rfl: 12 .  Lancets (ACCU-CHEK MULTICLIX) lancets, Check blood sugar twice daily. E11.9, Disp: 100 each, Rfl: 12 .  Multiple Vitamin (MULTIVITAMIN) tablet, Take 1 tablet by mouth daily. Reported on 11/21/2015, Disp: , Rfl:   .  omeprazole (PRILOSEC) 20 MG capsule, Take 1 capsule (20 mg total) by mouth 2 (two) times daily before a meal., Disp: 180 capsule, Rfl: 3 .  oxyCODONE-acetaminophen (ROXICET) 5-325 MG tablet, Limit one half to one tab by mouth per day or 2  times per day if tolerated, Disp: 50 tablet, Rfl: 0 .  Oyster Shell Calcium 500 MG TABS, Take 1 tablet by mouth 2 (two) times daily with a meal., Disp: , Rfl: 11 .  tamsulosin (FLOMAX) 0.4 MG CAPS capsule, Take 1 capsule (0.4 mg total) by mouth daily after breakfast., Disp: 90 capsule, Rfl: 3  No Known Allergies  Review of Systems  Constitutional: Negative for chills, fever, malaise/fatigue and weight loss.  HENT: Negative for hearing loss and tinnitus.   Eyes: Negative for blurred vision and double vision.  Respiratory: Negative for cough, shortness of breath and wheezing.   Cardiovascular: Negative for chest pain, palpitations and leg swelling.  Gastrointestinal: Positive for diarrhea. Negative for abdominal pain, blood in stool, heartburn, nausea and vomiting.  Genitourinary: Negative for dysuria, frequency and urgency.  Skin: Negative for rash.  Neurological: Negative for dizziness, tingling, tremors, weakness and headaches.      Objective  Vitals:   12/04/16 0949  BP: (!) 152/64  Pulse: 64  Resp: 16  Temp: 98.5 F (36.9 C)  TempSrc: Oral  Weight: 152 lb (68.9 kg)  Height: _0  (1.702 m)     Physical Exam  Constitutional: He is oriented to person, place, and time and well-developed, well-nourished, and in no distress. No distress.  HENT:  Head: Normocephalic and atraumatic.  Eyes: Conjunctivae and EOM are normal. Pupils are equal, round, and reactive to light. No scleral icterus.  Neck: Normal range of motion. Neck supple. Carotid bruit is not present. No thyromegaly present.  Cardiovascular: Normal rate, regular rhythm and normal heart sounds.   Pulmonary/Chest: Effort normal and breath sounds normal.  Abdominal: Soft. Bowel  sounds are normal. He exhibits no distension and no mass. There is no tenderness.   Bowel sounds slightly increased.  Musculoskeletal: He exhibits no edema.  Lymphadenopathy:    He has no cervical adenopathy.  Neurological: He is alert and oriented to person, place, and time.  Vitals reviewed.     Recent Results (from the past 2160 hour(s))  COMPLETE METABOLIC PANEL WITH GFR     Status: Abnormal   Collection Time: 09/09/16 12:01 AM  Result Value Ref Range   Sodium 140 135 - 146 mmol/L   Potassium 3.9 3.5 - 5.3 mmol/L   Chloride 101 98 - 110 mmol/L   CO2 29 20 - 31 mmol/L   Glucose, Bld 99 65 - 99 mg/dL   BUN 14 7 - 25 mg/dL   Creat 0.66 (L) 0.70 - 1.18 mg/dL    Comment:   For patients > or = 78 years of age: The upper reference limit for Creatinine is approximately 13% higher for people identified as African-American.      Total Bilirubin 1.2 0.2 - 1.2 mg/dL   Alkaline Phosphatase 89 40 - 115 U/L   AST 19 10 - 35 U/L   ALT 13 9 - 46 U/L   Total Protein 6.3 6.1 - 8.1 g/dL   Albumin 4.2 3.6 - 5.1 g/dL   Calcium 9.5 8.6 - 10.3 mg/dL   GFR, Est African American >89 >=60 mL/min   GFR, Est Non African American >89 >=60 mL/min  CBC with Differential     Status: Abnormal   Collection Time: 09/09/16 12:01 AM  Result Value Ref Range   WBC 5.4 3.8 - 10.8 K/uL   RBC 4.83 4.20 - 5.80 MIL/uL   Hemoglobin 15.2 13.2 - 17.1 g/dL   HCT 46.1 38.5 - 50.0 %   MCV 95.4 80.0 - 100.0 fL   MCH 31.5 27.0 - 33.0 pg   MCHC 33.0 32.0 - 36.0 g/dL   RDW 13.5 11.0 - 15.0 %   Platelets 163 140 - 400 K/uL   MPV 9.8 7.5 - 12.5 fL   Neutro Abs 4,212 1,500 - 7,800 cells/uL   Lymphs Abs 864 850 - 3,900 cells/uL   Monocytes Absolute 324 200 - 950 cells/uL   Eosinophils Absolute 0 (L) 15 - 500 cells/uL   Basophils Absolute 0 0 - 200 cells/uL   Neutrophils Relative % 78 %   Lymphocytes Relative 16 %   Monocytes Relative 6 %   Eosinophils Relative 0 %   Basophils Relative 0 %   Smear Review  Criteria for review not met   Lipid Profile     Status: None   Collection Time: 09/09/16 12:01 AM  Result Value Ref Range   Cholesterol 163 <200 mg/dL    Comment: ** Please note change in reference range(s). **  Triglycerides 141 <150 mg/dL    Comment: ** Please note change in reference range(s). **      HDL 43 >40 mg/dL    Comment: ** Please note change in reference range(s). **      Total CHOL/HDL Ratio 3.8 <5.0 Ratio   VLDL 28 <30 mg/dL   LDL Cholesterol 92 <100 mg/dL    Comment: ** Please note change in reference range(s). **     HgB A1c     Status: None   Collection Time: 09/09/16 12:01 AM  Result Value Ref Range   Hgb A1c MFr Bld 5.2 <5.7 %    Comment:   For the purpose of screening for the presence of diabetes:   <5.7%       Consistent with the absence of diabetes 5.7-6.4 %   Consistent with increased risk for diabetes (prediabetes) >=6.5 %     Consistent with diabetes   This assay result is consistent with a decreased risk of diabetes.   Currently, no consensus exists regarding use of hemoglobin A1c for diagnosis of diabetes in children.   According to American Diabetes Association (ADA) guidelines, hemoglobin A1c <7.0% represents optimal control in non-pregnant diabetic patients. Different metrics may apply to specific patient populations. Standards of Medical Care in Diabetes (ADA).      Mean Plasma Glucose 103 mg/dL     Assessment & Plan  1. Diarrhea, unspecified type May take Pepto-Bismol as needed. May take Metamucil daily to bulk stool.  Decrease to 2% mild from whole milk.  2. Esophagitis, reflux  Cont meds

## 2017-01-22 DIAGNOSIS — Z79899 Other long term (current) drug therapy: Secondary | ICD-10-CM | POA: Diagnosis not present

## 2017-01-22 DIAGNOSIS — M1180 Other specified crystal arthropathies, unspecified site: Secondary | ICD-10-CM | POA: Diagnosis not present

## 2017-02-19 ENCOUNTER — Other Ambulatory Visit: Payer: Self-pay

## 2017-02-19 NOTE — Patient Outreach (Signed)
Medication Adherence call to Mr. Zachary Brewer  Mr. Glazebrook said he has medication (enalapril 20 mg ) and he will order his medicine on the first of the month.    [facility] Direct Dial [phone]  Fax [phone] [email or web address]

## 2017-03-31 ENCOUNTER — Ambulatory Visit (INDEPENDENT_AMBULATORY_CARE_PROVIDER_SITE_OTHER): Payer: Medicare Other

## 2017-03-31 VITALS — BP 118/70 | HR 60 | Temp 97.9°F | Resp 16 | Ht 67.0 in | Wt 151.8 lb

## 2017-03-31 DIAGNOSIS — Z0000 Encounter for general adult medical examination without abnormal findings: Secondary | ICD-10-CM

## 2017-03-31 NOTE — Progress Notes (Signed)
Subjective:   Zachary Brewer is a 76 y.o. male who presents for Medicare Annual/Subsequent preventive examination.  Review of Systems:   Cardiac Risk Factors include: male gender;advanced age (>51men, >13 women);hypertension;dyslipidemia     Objective:    Vitals: BP 118/70 (BP Location: Left Arm, Patient Position: Sitting)   Pulse 60   Temp 97.9 F (36.6 C)   Resp 16   Ht 5\' 7"  (1.702 m)   Wt 151 lb 12.8 oz (68.9 kg)   BMI 23.78 kg/m   Body mass index is 23.78 kg/m.  Tobacco History  Smoking Status  . Never Smoker  Smokeless Tobacco  . Never Used     Counseling given: Not Answered   Past Medical History:  Diagnosis Date  . Allergy   . Arthritis 05/30/2015   Gout - Left knee and ankle  . Cervical pain 05/30/2015  . GERD (gastroesophageal reflux disease)   . Hyperlipidemia   . Hypertension   . 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 Wears dentures    full upper and lower (doesn't wear lower)  . Wears hearing aid    bilateral   Past Surgical History:  Procedure Laterality Date  . CATARACT EXTRACTION W/PHACO Right 06/11/2016   Procedure: CATARACT EXTRACTION PHACO AND INTRAOCULAR LENS PLACEMENT ([place]);  Surgeon: Leandrew Koyanagi, MD;  Location: [facility];  Service: Ophthalmology;  Laterality: Right;  . CATARACT EXTRACTION W/PHACO Left 07/09/2016   Procedure: CATARACT EXTRACTION PHACO AND INTRAOCULAR LENS PLACEMENT ([facility]);  Surgeon: Leandrew Koyanagi, MD;  Location: [facility];  Service: Ophthalmology;  Laterality: Left;  MALYUGIN  . ESOPHAGOGASTRODUODENOSCOPY (EGD) WITH PROPOFOL N/A 06/20/2015   Procedure: ESOPHAGOGASTRODUODENOSCOPY (EGD) WITH PROPOFOL with dialation;  Surgeon: Lucilla Lame, MD;  Location: [facility];  Service: Endoscopy;  Laterality: N/A;  . FLEXIBLE BRONCHOSCOPY N/A 06/22/2015   Procedure: FLEXIBLE BRONCHOSCOPY;  Surgeon: Allyne Gee, MD;  Location: [facility];  Service: Pulmonary;  Laterality: N/A;  . GASTRIC OUTLET OBSTRUCTION  RELEASE  2013  . 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  . UPPER GI ENDOSCOPY     Family History  Problem Relation Age of Onset  . Diabetes Mother   . Heart disease Father   . Diabetes Sister   . Diabetes Brother   . Stroke Brother   . Mental illness Brother   . Hypertension Paternal Grandfather    History  Sexual Activity  . Sexual activity: Not on file    Outpatient Encounter Prescriptions as of 03/31/2017  Medication Sig  . aspirin EC 81 MG tablet Take 81 mg by mouth daily.  . Blood Glucose Monitoring Suppl (ACCU-CHEK AVIVA) device Use as instructed Dx: E11.9  . colchicine 0.6 MG tablet Take 1 tablet (0.6 mg total) by mouth daily.  . enalapril (VASOTEC) 20 MG tablet Take 1.5 tablets each AM for BP.  Marland Kitchen Glucosamine-Chondroit-Vit C-Mn (GLUCOSAMINE 1500 COMPLEX PO) Take 1,500 mg by mouth daily.  Marland Kitchen glucose blood (ACCU-CHEK AVIVA PLUS) test strip Check blood sugar twice daily. Dx. E11.9  . Lancets (ACCU-CHEK MULTICLIX) lancets Check blood sugar twice daily. E11.9  . Multiple Vitamin (MULTIVITAMIN) tablet Take 1 tablet by mouth daily. Reported on 11/21/2015  . omeprazole (PRILOSEC) 20 MG capsule Take 1 capsule (20 mg total) by mouth 2 (two) times daily before a meal.  . Oyster Shell Calcium 500 MG TABS Take 1 tablet by mouth 2 (two) times daily with a meal.  . tamsulosin (FLOMAX) 0.4 MG CAPS capsule Take 1 capsule (0.4 mg total) by mouth  daily after breakfast.  . [DISCONTINUED] docusate sodium (COLACE) 100 MG capsule Take 100 mg by mouth daily as needed for mild constipation. Reported on 12/24/2015  . [DISCONTINUED] oxyCODONE-acetaminophen (ROXICET) 5-325 MG tablet Limit one half to one tab by mouth per day or 2  times per day if tolerated (Patient not taking: Reported on 03/31/2017)   No facility-administered encounter medications on file as of 03/31/2017.     Activities of Daily Living In your present state of health, do you have any difficulty performing the following activities: 03/31/2017  07/09/2016  Hearing? Y N  Vision? N N  Difficulty concentrating or making decisions? N N  Walking or climbing stairs? N N  Dressing or bathing? N N  Doing errands, shopping? N -  Preparing Food and eating ? N -  Using the Toilet? N -  In the past six months, have you accidently leaked urine? N -  Do you have problems with loss of bowel control? N -  Managing your Medications? N -  Managing your Finances? N -  Housekeeping or managing your Housekeeping? N -  Some recent data might be hidden    Patient Care Team: [name], DO as PCP - General (Family Medicine) Edrick Kins, MD as Rounding Team (Internal Medicine) Bernell List, CPhT as [facility] (Pharmacy Technician)   Assessment:     Exercise Activities and Dietary recommendations Current Exercise Habits: Home exercise routine, Type of exercise: walking, Time (Minutes): 30, Frequency (Times/Week): 7, Weekly Exercise (Minutes/Week): 210, Intensity: Mild  Goals    . Increase water intake          Recommend drinking at least 4-5 glasses of water a day      Fall Risk Fall Risk  03/31/2017 07/02/2016 05/02/2016 04/22/2016 02/14/2016  Falls in the past year? No No No No No  Number falls in past yr: - - - - -  Injury with Fall? - - - - -  Risk for fall due to : - - - - -  Follow up - - - - -   Depression Screen PHQ 2/9 Scores 03/31/2017 07/02/2016 05/02/2016 04/22/2016  PHQ - 2 Score 0 0 0 0  Exception Documentation - - - -    Cognitive Function     6CIT Screen 03/31/2017  What Year? 0 points  What month? 0 points  What time? 0 points  Count back from 20 0 points  Months in reverse 0 points  Repeat phrase 0 points  Total Score 0    Immunization History  Administered Date(s) Administered  . Influenza, High Dose Seasonal PF 07/24/2015, 09/09/2016  . PPD Test 08/03/2015   Screening Tests Health Maintenance  Topic Date Due  . INFLUENZA VACCINE  05/27/2017  .  TETANUS/TDAP  10/27/2022  . PNA vac Low Risk Adult  Completed      Plan:    I have personally reviewed and addressed the Medicare Annual Wellness questionnaire and have noted the following in the patient's chart:  A. Medical and social history B. Use of alcohol, tobacco or illicit drugs  C. Current medications and supplements D. Functional ability and status E.  Nutritional status F.  Physical activity G. Advance directives H. List of other physicians I.  Hospitalizations, surgeries, and ER visits in previous 12 months J.  [place] such as hearing and vision if needed, cognitive and depression L. Referrals and appointments   In addition, I have reviewed and discussed with  patient certain preventive protocols, quality metrics, and best practice recommendations. A written personalized care plan for preventive services as well as general preventive health recommendations were provided to patient.   Signed,  Tyler Aas, LPN Nurse Health Advisor   MD Recommendations: none

## 2017-03-31 NOTE — Patient Instructions (Signed)
Zachary Brewer , Thank you for taking time to come for your Medicare Wellness Visit. I appreciate your ongoing commitment to your health goals. Please review the following plan we discussed and let me know if I can assist you in the future.   Screening recommendations/referrals: Colonoscopy: Completed 06/27/2012 Recommended yearly ophthalmology/optometry visit for glaucoma screening and checkup Recommended yearly dental visit for hygiene and checkup  Vaccinations: Influenza vaccine: up to date, due 08/2017 Pneumococcal vaccine: completed series Tdap vaccine: up to date Shingles vaccine: up to date - please bring a copy of your immunization record  Advanced directives: Advance directive discussed with you today. I have provided a copy for you to complete at home and have notarized. Once this is complete please bring a copy in to our office so we can scan it into your chart.  Conditions/risks identified: Recommend drinking at least 4-5 glasses of water a day   Next appointment: Follow up with Dr. Parks Ranger on 04/02/2017 at 8:00am. Follow up in one year for your annual wellness exam.   Preventive Care 65 Years and Older, Male Preventive care refers to lifestyle choices and visits with your health care provider that can promote health and wellness. What does preventive care include?  A yearly physical exam. This is also called an annual well check.  Dental exams once or twice a year.  Routine eye exams. Ask your health care provider how often you should have your eyes checked.  Personal lifestyle choices, including:  Daily care of your teeth and gums.  Regular physical activity.  Eating a healthy diet.  Avoiding tobacco and drug use.  Limiting alcohol use.  Practicing safe sex.  Taking low doses of aspirin every day.  Taking vitamin and mineral supplements as recommended by your health care provider. What happens during an annual well check? The services and screenings done by  your health care provider during your annual well check will depend on your age, overall health, lifestyle risk factors, and family history of disease. Counseling  Your health care provider may ask you questions about your:  Alcohol use.  Tobacco use.  Drug use.  Emotional well-being.  Home and relationship well-being.  Sexual activity.  Eating habits.  History of falls.  Memory and ability to understand (cognition).  Work and work Statistician. Screening  You may have the following tests or measurements:  Height, weight, and BMI.  Blood pressure.  Lipid and cholesterol levels. These may be checked every 5 years, or more frequently if you are over 5 years old.  Skin check.  Lung cancer screening. You may have this screening every year starting at age 8 if you have a 30-pack-year history of smoking and currently smoke or have quit within the past 15 years.  Fecal occult blood test (FOBT) of the stool. You may have this test every year starting at age 44.  Flexible sigmoidoscopy or colonoscopy. You may have a sigmoidoscopy every 5 years or a colonoscopy every 10 years starting at age 68.  Prostate cancer screening. Recommendations will vary depending on your family history and other risks.  Hepatitis C blood test.  Hepatitis B blood test.  Sexually transmitted disease (STD) testing.  Diabetes screening. This is done by checking your blood sugar (glucose) after you have not eaten for a while (fasting). You may have this done every 1-3 years.  Abdominal aortic aneurysm (AAA) screening. You may need this if you are a current or former smoker.  Osteoporosis. You may be screened starting  at age 43 if you are at high risk. Talk with your health care provider about your test results, treatment options, and if necessary, the need for more tests. Vaccines  Your health care provider may recommend certain vaccines, such as:  Influenza vaccine. This is recommended every  year.  Tetanus, diphtheria, and acellular pertussis (Tdap, Td) vaccine. You may need a Td booster every 10 years.  Zoster vaccine. You may need this after age 51.  Pneumococcal 13-valent conjugate (PCV13) vaccine. One dose is recommended after age 42.  Pneumococcal polysaccharide (PPSV23) vaccine. One dose is recommended after age 45. Talk to your health care provider about which screenings and vaccines you need and how often you need them. This information is not intended to replace advice given to you by your health care provider. Make sure you discuss any questions you have with your health care provider. Document Released: 11/09/2015 Document Revised: 07/02/2016 Document Reviewed: 08/14/2015 Elsevier Interactive Patient Education  2017 [place] Prevention in the Home Falls can cause injuries. They can happen to people of all ages. There are many things you can do to make your home safe and to help prevent falls. What can I do on the outside of my home?  Regularly fix the edges of walkways and driveways and fix any cracks.  Remove anything that might make you trip as you walk through a door, such as a raised step or threshold.  Trim any bushes or trees on the path to your home.  Use bright outdoor lighting.  Clear any walking paths of anything that might make someone trip, such as rocks or tools.  Regularly check to see if handrails are loose or broken. Make sure that both sides of any steps have handrails.  Any raised decks and porches should have guardrails on the edges.  Have any leaves, snow, or ice cleared regularly.  Use sand or salt on walking paths during winter.  Clean up any spills in your garage right away. This includes oil or grease spills. What can I do in the bathroom?  Use night lights.  Install grab bars by the toilet and in the tub and shower. Do not use towel bars as grab bars.  Use non-skid mats or decals in the tub or shower.  If you  need to sit down in the shower, use a plastic, non-slip stool.  Keep the floor dry. Clean up any water that spills on the floor as soon as it happens.  Remove soap buildup in the tub or shower regularly.  Attach bath mats securely with double-sided non-slip rug tape.  Do not have throw rugs and other things on the floor that can make you trip. What can I do in the bedroom?  Use night lights.  Make sure that you have a light by your bed that is easy to reach.  Do not use any sheets or blankets that are too big for your bed. They should not hang down onto the floor.  Have a firm chair that has side arms. You can use this for support while you get dressed.  Do not have throw rugs and other things on the floor that can make you trip. What can I do in the kitchen?  Clean up any spills right away.  Avoid walking on wet floors.  Keep items that you use a lot in easy-to-reach places.  If you need to reach something above you, use a strong step stool that has a grab bar.  Keep electrical cords out of the way.  Do not use floor polish or wax that makes floors slippery. If you must use wax, use non-skid floor wax.  Do not have throw rugs and other things on the floor that can make you trip. What can I do with my stairs?  Do not leave any items on the stairs.  Make sure that there are handrails on both sides of the stairs and use them. Fix handrails that are broken or loose. Make sure that handrails are as long as the stairways.  Check any carpeting to make sure that it is firmly attached to the stairs. Fix any carpet that is loose or worn.  Avoid having throw rugs at the top or bottom of the stairs. If you do have throw rugs, attach them to the floor with carpet tape.  Make sure that you have a light switch at the top of the stairs and the bottom of the stairs. If you do not have them, ask someone to add them for you. What else can I do to help prevent falls?  Wear shoes  that:  Do not have high heels.  Have rubber bottoms.  Are comfortable and fit you well.  Are closed at the toe. Do not wear sandals.  If you use a stepladder:  Make sure that it is fully opened. Do not climb a closed stepladder.  Make sure that both sides of the stepladder are locked into place.  Ask someone to hold it for you, if possible.  Clearly mark and make sure that you can see:  Any grab bars or handrails.  First and last steps.  Where the edge of each step is.  Use tools that help you move around (mobility aids) if they are needed. These include:  Canes.  Walkers.  Scooters.  Crutches.  Turn on the lights when you go into a dark area. Replace any light bulbs as soon as they burn out.  Set up your furniture so you have a clear path. Avoid moving your furniture around.  If any of your floors are uneven, fix them.  If there are any pets around you, be aware of where they are.  Review your medicines with your doctor. Some medicines can make you feel dizzy. This can increase your chance of falling. Ask your doctor what other things that you can do to help prevent falls. This information is not intended to replace advice given to you by your health care provider. Make sure you discuss any questions you have with your health care provider. Document Released: 08/09/2009 Document Revised: 03/20/2016 Document Reviewed: 11/17/2014 Elsevier Interactive Patient Education  2017 [employer].

## 2017-04-02 ENCOUNTER — Other Ambulatory Visit: Payer: Self-pay | Admitting: Family Medicine

## 2017-04-02 ENCOUNTER — Ambulatory Visit (INDEPENDENT_AMBULATORY_CARE_PROVIDER_SITE_OTHER): Payer: Medicare Other | Admitting: Family Medicine

## 2017-04-02 ENCOUNTER — Encounter: Payer: Self-pay | Admitting: Family Medicine

## 2017-04-02 VITALS — BP 125/73 | HR 72 | Temp 98.2°F | Resp 16 | Ht 67.0 in | Wt 151.0 lb

## 2017-04-02 DIAGNOSIS — D508 Other iron deficiency anemias: Secondary | ICD-10-CM

## 2017-04-02 DIAGNOSIS — E441 Mild protein-calorie malnutrition: Secondary | ICD-10-CM

## 2017-04-02 DIAGNOSIS — R739 Hyperglycemia, unspecified: Secondary | ICD-10-CM

## 2017-04-02 DIAGNOSIS — N138 Other obstructive and reflux uropathy: Secondary | ICD-10-CM

## 2017-04-02 DIAGNOSIS — E782 Mixed hyperlipidemia: Secondary | ICD-10-CM

## 2017-04-02 DIAGNOSIS — M150 Primary generalized (osteo)arthritis: Secondary | ICD-10-CM | POA: Diagnosis not present

## 2017-04-02 DIAGNOSIS — I10 Essential (primary) hypertension: Secondary | ICD-10-CM | POA: Diagnosis not present

## 2017-04-02 DIAGNOSIS — M159 Polyosteoarthritis, unspecified: Secondary | ICD-10-CM

## 2017-04-02 DIAGNOSIS — N401 Enlarged prostate with lower urinary tract symptoms: Secondary | ICD-10-CM | POA: Diagnosis not present

## 2017-04-02 DIAGNOSIS — E559 Vitamin D deficiency, unspecified: Secondary | ICD-10-CM

## 2017-04-02 DIAGNOSIS — Z125 Encounter for screening for malignant neoplasm of prostate: Secondary | ICD-10-CM

## 2017-04-02 DIAGNOSIS — Z0000 Encounter for general adult medical examination without abnormal findings: Secondary | ICD-10-CM

## 2017-04-02 DIAGNOSIS — K2100 Gastro-esophageal reflux disease with esophagitis, without bleeding: Secondary | ICD-10-CM

## 2017-04-02 DIAGNOSIS — M1120 Other chondrocalcinosis, unspecified site: Secondary | ICD-10-CM

## 2017-04-02 DIAGNOSIS — R799 Abnormal finding of blood chemistry, unspecified: Secondary | ICD-10-CM

## 2017-04-02 DIAGNOSIS — K311 Adult hypertrophic pyloric stenosis: Secondary | ICD-10-CM | POA: Diagnosis not present

## 2017-04-02 DIAGNOSIS — M1189 Other specified crystal arthropathies, multiple sites: Secondary | ICD-10-CM | POA: Diagnosis not present

## 2017-04-02 MED ORDER — OMEPRAZOLE 20 MG PO CPDR: 20.0000 mg | DELAYED_RELEASE_CAPSULE | Freq: Two times a day (BID) | ORAL | 3 refills | Status: DC

## 2017-04-02 MED ORDER — COLCHICINE 0.6 MG PO TABS: 0.6000 mg | ORAL_TABLET | Freq: Every day | ORAL | 3 refills | Status: DC

## 2017-04-02 MED ORDER — ENALAPRIL MALEATE 20 MG PO TABS: 30.0000 mg | ORAL_TABLET | Freq: Every day | ORAL | 3 refills | Status: DC

## 2017-04-02 MED ORDER — TAMSULOSIN HCL 0.4 MG PO CAPS: 0.4000 mg | ORAL_CAPSULE | Freq: Every day | ORAL | 3 refills | Status: DC

## 2017-04-02 NOTE — Assessment & Plan Note (Signed)
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

## 2017-04-02 NOTE — Assessment & Plan Note (Signed)
Clinically history of mild low weight in past and some mild muscle wasting, since improved following critical illness, improved appetite, limited by lack of dentures currently, on pureed diet - Weight stable now - Follow-up, check chemistry with physical

## 2017-04-02 NOTE — Assessment & Plan Note (Signed)
Controlled with lifestyle. History of abnormal glucose but clinically normal range A1c, without dx Pre-DM - Concern with fam history  Plan:  1. Not on any therapy currently  2. Encourage improved lifestyle - low carb, low sugar diet, do not need to restrict portion size since history of low weight in past, limited by lack of dentures currently 3. Follow-up yearly A1c

## 2017-04-02 NOTE — Assessment & Plan Note (Signed)
Suspected primary etiology for multiple joint pain, also with pseudogout. - No longer on opioids for pain management - Continue Tylenol regular vs PRN, and Colchicine - RICE therapy - Follow-up, no further imaging or intervention

## 2017-04-02 NOTE — Assessment & Plan Note (Signed)
Refilled Flomax

## 2017-04-02 NOTE — Progress Notes (Signed)
Subjective:    Patient ID: Zachary Brewer, male    DOB: 10/28/1940, 76 y.o.   MRN: [id number]  Zachary Brewer is a 76 y.o. male presenting on 04/02/2017 for Arthritis   HPI   CHRONIC HTN: Reports no new concerns. Doing well. Has detailed home BP log daily with appropriate range 120-130s / 60-80s, rarely SBP 140. Current Meds - Enalapril 53m (takes 1.5 of the 224mtabs)   Reports good compliance, took meds today. Tolerating well, w/o complaints. - Admits prior episode of dizziness for 3-4 days (>1 year ago with off balance, since resolved) Denies CP, dyspnea, HA, edema, lightheadedness, vertigo  Elevated Blood Sugar: Reports no concerns specifically but he likes to "watch" his sugar due to family history of Diabetes (brother, sister, and mother). He has never been diagnosed with Pre-DM highest prior A1c 5.4 CBGs: Avg 88-95, Low none, High 129. Checks CBGs x 1 weekly - has detailed log today Meds: None (never) Currently on ACEi Lifestyle: - Diet: Not specifically limiting carbs in diet, eats balanced. Now due to no dentures eating pureed diet, see below  - Exercise: regular activity some walking Denies hypoglycemia, polyuria, visual changes, numbness or tingling.  History of Gastric Outlet Obstruction (S/p Roux-en-Y surgery) / History of weight loss / GERD: - Briefly reviews prior history of s/p gastric surgery in past by general surgery Dr Zachary Brewer years ago. He has had subsequent obstruction requiring re-construction and release, in 2013, and also re-evaluation by GI Dr Zachary Brewer in 2016. He had complication with regurgitation and aspiration pneumonia hospitalized 05/2015, due to acute illness and problem with dentures lost weight at that time, then gradually regained weight over past 1-2 years. Now wt stable 145-147 lb avg w/o clothes, measures wt everyday - Currently doing well, except still waiting on new dentures, states they were "messed up" multiple times, he is eating exclusively  pureed diet and oatmeal, asking about problem with inc fiber in diet, he has loose stool every 2-3 days then improved, without diarrhea. Some limited water intake. - Taking Omeprazole 2077mID for GERD - Denies any dark stool, rectal bleeding or blood in stool, abdominal pain, nausea or vomiting  Pseudogout Arthropathy (CPDD) - History of multiple joint pain and OA/DJD and Gouty arthritis (wrists and knee). Had been followed by Dr CriPrimus Bravor pain management, on oxycodone PRN, and voltaren topical. [facility]Dr BehAnnalee Gentaast seen 12/2016, and he has since been on opiate therapy and doing well (last rx filled 06/2016) and since he has been on regular Tylenol dosing instead and preventative colchicine 0.6mg64mily. Given medrol dose pak PRN for flare.  Additional social history: - Lives at home alone, she has daughter that lives in [place] miles away. He has several friends and neighbors that live nearby in [place]  Substance Use Topics  . Smoking status: Never Smoker  . Smokeless tobacco: Never Used  . Alcohol use No   Past Surgical History:  Procedure Laterality Date  . CATARACT EXTRACTION W/PHACO Right 06/11/2016   Procedure: CATARACT EXTRACTION PHACO AND INTRAOCULAR LENS PLACEMENT (IOC)[place];  [facility] [place] [facility];  Laterality: Right;  . CATARACT EXTRACTION W/PHACO Left 07/09/2016   Procedure: CATARACT EXTRACTION PHACO AND INTRAOCULAR LENS PLACEMENT (IOC);  Surgeon: Zachary Brewer;  [facility];  Laterality: Left;  MALYUGIN  . ESOPHAGOGASTRODUODENOSCOPY (Brewer) WITH PROPOFOL  N/A 06/20/2015   Procedure: ESOPHAGOGASTRODUODENOSCOPY (Brewer) WITH PROPOFOL with dialation;  Surgeon: Zachary Lame, MD;  Location: [facility];  Service: Endoscopy;  Laterality: N/A;  . FLEXIBLE BRONCHOSCOPY N/A 06/22/2015   Procedure:  FLEXIBLE BRONCHOSCOPY;  Surgeon: Zachary Gee, MD;  Location: [facility];  Service: Pulmonary;  Laterality: N/A;  . GASTRIC OUTLET OBSTRUCTION RELEASE  2013  . 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  . UPPER GI ENDOSCOPY       Review of Systems Per HPI unless specifically indicated above     Objective:    BP 125/73   Pulse 72   Temp 98.2 F (36.8 C) (Oral)   Resp 16   Ht '5\' 7"'  (1.702 m)   Wt 151 lb (68.5 kg)   BMI 23.65 kg/m   Wt Readings from Last 3 Encounters:  04/02/17 151 lb (68.5 kg)  03/31/17 151 lb 12.8 oz (68.9 kg)  12/04/16 152 lb (68.9 kg)    Physical Exam  Constitutional: He is oriented to person, place, and time. He appears well-developed and well-nourished. No distress.  Well-appearing 76 year old male mildly thin but healthy appearing, comfortable, cooperative  HENT:  Head: Normocephalic and atraumatic.  Mouth/Throat: Oropharynx is clear and moist.  Frontal / maxillary sinuses non-tender. Nares patent without purulence or edema. Bilateral TMs clear without erythema, effusion or bulging. Oropharynx clear without erythema, exudates, edema or asymmetry.  Hearing is normal. Does not have hearing aids in place today.  Poor dentition - without all teeth, does not have dentures in place today  Eyes: Conjunctivae are normal. Right eye exhibits no discharge. Left eye exhibits no discharge.  Neck: Normal range of motion. Neck supple. No thyromegaly present.  No carotid bruits  Cardiovascular: Normal rate, regular rhythm, normal heart sounds and intact distal pulses.   No murmur heard. Pulmonary/Chest: Effort normal and breath sounds normal. No respiratory distress. He has no wheezes. He has no rales.  Good air movement, except slightly diminished RLL breath sounds.  Abdominal: Soft. Bowel sounds are normal. He exhibits no distension and no mass. There is no tenderness. There is no rebound.  Musculoskeletal: He exhibits no edema.  Distal muscle strength intact 5/5  grip  Bilateral knees and ankles with slight bulkiness appearance, non-tender to palpation joint line, no effusion, no erythema. Has compression sleeve for knees. Full active ROM knees, ankle.  Ambulate without difficulty, normal gait.  Lymphadenopathy:    He has no cervical adenopathy.  Neurological: He is alert and oriented to person, place, and time.  Distal sensation to light touch intact  Skin: Skin is warm and dry. No rash noted. He is not diaphoretic. No erythema.  Psychiatric: He has a normal mood and affect. His behavior is normal.  Well groomed, good eye contact, normal speech and thoughts  Nursing note and vitals reviewed.    Results for orders placed or performed in visit on 09/09/16  COMPLETE METABOLIC PANEL WITH GFR  Result Value Ref Range   Sodium 140 135 - 146 mmol/L   Potassium 3.9 3.5 - 5.3 mmol/L   Chloride 101 98 - 110 mmol/L   CO2 29 20 - 31 mmol/L   Glucose, Bld 99 65 - 99 mg/dL   BUN 14 7 - 25 mg/dL   Creat 0.66 (L) 0.70 - 1.18 mg/dL   Total Bilirubin 1.2 0.2 - 1.2 mg/dL   Alkaline Phosphatase 89 40 - 115 U/L   AST 19 10 - 35 U/L   ALT 13 9 - 46  U/L   Total Protein 6.3 6.1 - 8.1 g/dL   Albumin 4.2 3.6 - 5.1 g/dL   Calcium 9.5 8.6 - 10.3 mg/dL   GFR, Est African American >89 >=60 mL/min   GFR, Est Non African American >89 >=60 mL/min  CBC with Differential  Result Value Ref Range   WBC 5.4 3.8 - 10.8 K/uL   RBC 4.83 4.20 - 5.80 MIL/uL   Hemoglobin 15.2 13.2 - 17.1 g/dL   HCT 46.1 38.5 - 50.0 %   MCV 95.4 80.0 - 100.0 fL   MCH 31.5 27.0 - 33.0 pg   MCHC 33.0 32.0 - 36.0 g/dL   RDW 13.5 11.0 - 15.0 %   Platelets 163 140 - 400 K/uL   MPV 9.8 7.5 - 12.5 fL   Neutro Abs 4,212 1,500 - 7,800 cells/uL   Lymphs Abs 864 850 - 3,900 cells/uL   Monocytes Absolute 324 200 - 950 cells/uL   Eosinophils Absolute 0 (L) 15 - 500 cells/uL   Basophils Absolute 0 0 - 200 cells/uL   Neutrophils Relative % 78 %   Lymphocytes Relative 16 %   Monocytes Relative 6 %    Eosinophils Relative 0 %   Basophils Relative 0 %   Smear Review Criteria for review not met   Lipid Profile  Result Value Ref Range   Cholesterol 163 <200 mg/dL   Triglycerides 141 <150 mg/dL   HDL 43 >40 mg/dL   Total CHOL/HDL Ratio 3.8 <5.0 Ratio   VLDL 28 <30 mg/dL   LDL Cholesterol 92 <100 mg/dL  HgB A1c  Result Value Ref Range   Hgb A1c MFr Bld 5.2 <5.7 %   Mean Plasma Glucose 103 mg/dL      Assessment & Plan:   Problem List Items Addressed This Visit    Pseudogout   Osteoarthritis of multiple joints    Suspected primary etiology for multiple joint pain, also with pseudogout. - No longer on opioids for pain management - Continue Tylenol regular vs PRN, and Colchicine - RICE therapy - Follow-up, no further imaging or intervention      Relevant Medications   colchicine 0.6 MG tablet   Mild protein-calorie malnutrition (HCC)    Clinically history of mild low weight in past and some mild muscle wasting, since improved following critical illness, improved appetite, limited by lack of dentures currently, on pureed diet - Weight stable now - Follow-up, check chemistry with physical      Gastric outlet obstruction    Stable, s/p surgical reconstruction History of gastric bypass, then reconstruction, had been followed by GI Dr Allen Norris - Maintaining stable weight - No new concerns - Follow-up as needed      Essential (primary) hypertension - Primary    Well-controlled HTN - Home BP readings normal, very detailed log   No known complications    Plan:  1.  Continue current BP regimen - Enalapril 68m daily - refilled 2. Encourage improved lifestyle - low sodium diet, regular exercise 3. Continue monitor BP outside office, bring readings to next visit, if persistently >140/90 or new symptoms notify office sooner 4. Follow-up q 6 months - next annual physical + labs      Relevant Medications   enalapril (VASOTEC) 20 MG tablet   Esophagitis, reflux    Stable on  Omeprazole PPI 227mBID Complex history with prior gastric outlet obstruction and bypass surgery      Relevant Medications   omeprazole (PRILOSEC) 20 MG capsule   Calcium  pyrophosphate arthropathy of multiple sites    Chronic joint problem with aspiration and crystal confirmation for pseudogout, primarily knees and wrist Followed by Rheumatology [facility] Dr Jenny Reichmann) - Stable on Colchicine 0.41m daily prevention - Follow-up PRN flare      Relevant Medications   colchicine 0.6 MG tablet   BPH with obstruction/lower urinary tract symptoms    Refilled Flomax      Relevant Medications   tamsulosin (FLOMAX) 0.4 MG CAPS capsule   Blood glucose elevated    Controlled with lifestyle. History of abnormal glucose but clinically normal range A1c, without dx Pre-DM - Concern with fam history  Plan:  1. Not on any therapy currently  2. Encourage improved lifestyle - low carb, low sugar diet, do not need to restrict portion size since history of low weight in past, limited by lack of dentures currently 3. Follow-up yearly A1c         Meds ordered this encounter  Medications  . colchicine 0.6 MG tablet    Sig: Take 1 tablet (0.6 mg total) by mouth daily.    Dispense:  90 tablet    Refill:  3  . enalapril (VASOTEC) 20 MG tablet    Sig: Take 1.5 tablets (30 mg total) by mouth daily.    Dispense:  135 tablet    Refill:  3  . omeprazole (PRILOSEC) 20 MG capsule    Sig: Take 1 capsule (20 mg total) by mouth 2 (two) times daily before a meal.    Dispense:  180 capsule    Refill:  3  . tamsulosin (FLOMAX) 0.4 MG CAPS capsule    Sig: Take 1 capsule (0.4 mg total) by mouth daily after breakfast.    Dispense:  90 capsule    Refill:  3    Follow up plan: Return in about 6 months (around 10/02/2017) for Annual Physical.  ANobie Putnam DO [facility] 04/02/2017, 3:16 PM

## 2017-04-02 NOTE — Assessment & Plan Note (Signed)
Stable, s/p surgical reconstruction History of gastric bypass, then reconstruction, had been followed by GI Dr Allen Norris - Maintaining stable weight - No new concerns - Follow-up as needed

## 2017-04-02 NOTE — Assessment & Plan Note (Signed)
Stable on Omeprazole PPI 20mg BID Complex history with prior gastric outlet obstruction and bypass surgery 

## 2017-04-02 NOTE — Patient Instructions (Signed)
Thank you for coming to the clinic today.  1. Keep up the good work!  No changes to medicine today.  Refilled all meds for 90 days with refills for up to 1 year  I think the loose stool is likely from dietary change with puree and oatmeal.  Try to stay well hydrated.  You will be due for FASTING BLOOD WORK (no food or drink after midnight before, only water or coffee without cream/sugar on the morning of)  - Please go ahead and schedule a "Lab Only" visit in the morning at the clinic for lab draw in 5-6 MONTHS  Please schedule a Follow-up Appointment to: Return in about 6 months (around 10/02/2017) for Annual Physical.  If you have any other questions or concerns, please feel free to call the clinic or send a message through [place]. You may also schedule an earlier appointment if necessary.  [name], DO [place]

## 2017-04-02 NOTE — Assessment & Plan Note (Signed)
Well-controlled HTN - Home BP readings normal, very detailed log   No known complications    Plan:  1.  Continue current BP regimen - Enalapril 30mg  daily - refilled 2. Encourage improved lifestyle - low sodium diet, regular exercise 3. Continue monitor BP outside office, bring readings to next visit, if persistently >140/90 or new symptoms notify office sooner 4. Follow-up q 6 months - next annual physical + labs

## 2017-09-23 ENCOUNTER — Other Ambulatory Visit: Payer: Medicare Other

## 2017-09-23 DIAGNOSIS — R739 Hyperglycemia, unspecified: Secondary | ICD-10-CM | POA: Diagnosis not present

## 2017-09-23 DIAGNOSIS — E559 Vitamin D deficiency, unspecified: Secondary | ICD-10-CM

## 2017-09-23 DIAGNOSIS — E782 Mixed hyperlipidemia: Secondary | ICD-10-CM | POA: Diagnosis not present

## 2017-09-23 DIAGNOSIS — Z0000 Encounter for general adult medical examination without abnormal findings: Secondary | ICD-10-CM

## 2017-09-23 DIAGNOSIS — D508 Other iron deficiency anemias: Secondary | ICD-10-CM

## 2017-09-23 DIAGNOSIS — I10 Essential (primary) hypertension: Secondary | ICD-10-CM | POA: Diagnosis not present

## 2017-09-23 DIAGNOSIS — Z125 Encounter for screening for malignant neoplasm of prostate: Secondary | ICD-10-CM

## 2017-09-24 LAB — CBC WITH DIFFERENTIAL/PLATELET
Basophils Absolute: 40 cells/uL (ref 0–200)
Basophils Relative: 0.6 %
EOS PCT: 3.6 %
Eosinophils Absolute: 238 cells/uL (ref 15–500)
HEMATOCRIT: 45.1 % (ref 38.5–50.0)
HEMOGLOBIN: 15.4 g/dL (ref 13.2–17.1)
LYMPHS ABS: 1010 {cells}/uL (ref 850–3900)
MCH: 31.5 pg (ref 27.0–33.0)
MCHC: 34.1 g/dL (ref 32.0–36.0)
MCV: 92.2 fL (ref 80.0–100.0)
MONOS PCT: 8.2 %
MPV: 10.4 fL (ref 7.5–12.5)
NEUTROS ABS: 4772 {cells}/uL (ref 1500–7800)
Neutrophils Relative %: 72.3 %
Platelets: 145 10*3/uL (ref 140–400)
RBC: 4.89 10*6/uL (ref 4.20–5.80)
RDW: 12.6 % (ref 11.0–15.0)
Total Lymphocyte: 15.3 %
WBC mixed population: 541 cells/uL (ref 200–950)
WBC: 6.6 10*3/uL (ref 3.8–10.8)

## 2017-09-24 LAB — COMPLETE METABOLIC PANEL WITH GFR
AG RATIO: 1.7 (calc) (ref 1.0–2.5)
ALT: 17 U/L (ref 9–46)
AST: 21 U/L (ref 10–35)
Albumin: 4 g/dL (ref 3.6–5.1)
Alkaline phosphatase (APISO): 106 U/L (ref 40–115)
BILIRUBIN TOTAL: 1.3 mg/dL — AB (ref 0.2–1.2)
BUN: 14 mg/dL (ref 7–25)
CHLORIDE: 102 mmol/L (ref 98–110)
CO2: 32 mmol/L (ref 20–32)
Calcium: 9.4 mg/dL (ref 8.6–10.3)
Creat: 0.76 mg/dL (ref 0.70–1.18)
GFR, EST AFRICAN AMERICAN: 101 mL/min/{1.73_m2} (ref >=60)
GFR, EST NON AFRICAN AMERICAN: 87 mL/min/{1.73_m2} (ref >=60)
GLOBULIN: 2.4 g/dL (ref 1.9–3.7)
Glucose, Bld: 95 mg/dL (ref 65–99)
Potassium: 3.6 mmol/L (ref 3.5–5.3)
SODIUM: 141 mmol/L (ref 135–146)
TOTAL PROTEIN: 6.4 g/dL (ref 6.1–8.1)

## 2017-09-24 LAB — LIPID PANEL
CHOL/HDL RATIO: 3.4 (calc) (ref <5.0)
CHOLESTEROL: 151 mg/dL (ref <200)
HDL: 44 mg/dL (ref >40)
LDL Cholesterol (Calc): 87 mg/dL (calc)
NON-HDL CHOLESTEROL (CALC): 107 mg/dL (ref <130)
TRIGLYCERIDES: 108 mg/dL (ref <150)

## 2017-09-24 LAB — HEMOGLOBIN A1C
Hgb A1c MFr Bld: 5.4 % of total Hgb (ref <5.7)
Mean Plasma Glucose: 108 (calc)
eAG (mmol/L): 6 (calc)

## 2017-09-24 LAB — VITAMIN D 25 HYDROXY (VIT D DEFICIENCY, FRACTURES): Vit D, 25-Hydroxy: 35 ng/mL (ref 30–100)

## 2017-09-24 LAB — PSA, TOTAL WITH REFLEX TO PSA, FREE: PSA, Total: 1.3 ng/mL (ref <=4.0)

## 2017-10-02 ENCOUNTER — Ambulatory Visit (INDEPENDENT_AMBULATORY_CARE_PROVIDER_SITE_OTHER): Payer: Medicare Other | Admitting: Family Medicine

## 2017-10-02 ENCOUNTER — Encounter: Payer: Self-pay | Admitting: Family Medicine

## 2017-10-02 VITALS — BP 134/70 | HR 54 | Temp 98.5°F | Resp 16 | Ht 67.0 in | Wt 153.0 lb

## 2017-10-02 DIAGNOSIS — I10 Essential (primary) hypertension: Secondary | ICD-10-CM | POA: Diagnosis not present

## 2017-10-02 DIAGNOSIS — R001 Bradycardia, unspecified: Secondary | ICD-10-CM

## 2017-10-02 DIAGNOSIS — E782 Mixed hyperlipidemia: Secondary | ICD-10-CM

## 2017-10-02 DIAGNOSIS — Z0000 Encounter for general adult medical examination without abnormal findings: Secondary | ICD-10-CM

## 2017-10-02 DIAGNOSIS — E559 Vitamin D deficiency, unspecified: Secondary | ICD-10-CM

## 2017-10-02 DIAGNOSIS — R739 Hyperglycemia, unspecified: Secondary | ICD-10-CM | POA: Diagnosis not present

## 2017-10-02 MED ORDER — GLUCOSE BLOOD VI STRP: ORAL_STRIP | 12 refills | Status: DC

## 2017-10-02 NOTE — Assessment & Plan Note (Signed)
Stable chronic bradycardia Asymptomatic Continue current meds, avoid BB

## 2017-10-02 NOTE — Assessment & Plan Note (Addendum)
Controlled cholesterol on lifestyle Last lipid panel 08/2017 Calculated ASCVD 10 yr risk score elevated due to age, HTN  Plan: 1. Briefly reviewed ASCVD risk - consider low dose statin for future risk reduction, but agree to defer for now, patient not interested in starting new med 2. Continue ASA 81mg  for primary ASCVD risk reduction 3. Encourage improved lifestyle - low carb/cholesterol, reduce portion size, continue improving regular exercise 4. Follow-up yearly lipids

## 2017-10-02 NOTE — Assessment & Plan Note (Addendum)
Controlled with lifestyle. History of abnormal glucose but clinically normal range A1c, without dx Pre-DM - Concern with fam history  Plan:  1. Not on any therapy currently  2. Encourage improved lifestyle - low carb, low sugar diet, do not need to restrict portion size 3. Follow-up yearly A1c

## 2017-10-02 NOTE — Progress Notes (Signed)
Subjective:    Patient ID: Zachary Brewer, male    DOB: 29-Oct-1940, 76 y.o.   MRN: [id number]  Zachary Brewer is a 76 y.o. male presenting on 10/02/2017 for Annual Exam   HPI   Here for Annual Physical and Lab Review.  CHRONIC HTN: Reports no new concerns. Doing well. Has detailed home BP log daily with appropriate range 120-130s / 60-80s, rarely SBP 140. Current Meds - Enalapril 30mg  (takes 1.5 of the 20mg  tabs)   Reports good compliance, took meds today. Tolerating well, w/o complaints.  HYPERLIPIDEMIA: - Reports no concerns. Last lipid panel 08/2017, controlled  - Not on Statin or omega 3 - Taking ASA 81mg  daily  History of Vitamin D Deficiency - Last lab checked Vit D 35, he continues to take Vitamin D3 1,000 iu daily and Calcium + 500  Elevated Blood Sugar: Reports no concerns specifically but he likes to "watch" his sugar due to family history of Diabetes. No dx PreDM - Last A1c 5.4 CBGs: Avg 90 to 100, Low none, High 122. Checks CBGs x 1 weekly - has detailed log toda Meds: None (never) Currently on ACEi Lifestyle: - Diet: Not specifically limiting carbs in diet, eats balanced. Now due to no dentures eating pureed diet, see below  - Exercise: regular activity some walking Denies hypoglycemia, polyuria, visual changes, numbness or tingling.  Additional complaint SINUSITIS (Resolved) / Episodic Dizziness / Fall Reports recent sinus infection, has since resolved, caused him difficulty with balance, had accidental fall while trying to get vacuum cleaner out of closet as he quickly turned around. This fall took place about 1 month ago, has had occasional tingling down R arm, seems to be improved - Old R hand fracture 1958, has arthritis, some stiffness, negative spurling's - Right handed - Denies any pain, swelling, redness, weakness, active numbness tingling, other joint pain or head pain, no other bruising  PMH - History of Gastric Outlet Obstruction (S/p Roux-en-Y  surgery) / GERD:  Health Maintenance: UTD Flu Shot 08/26/17 UTD PNA Vaccine UTD TDap Offered DEXA Scan, no prior for osteoporosis screening, and he had fall recently. He declines, not interested in imaging test. Agrees to continue Vitamin D and Calcium.  Depression screen [facility] 2/9 10/02/2017 03/31/2017 07/02/2016  Decreased Interest 0 0 0  Down, Depressed, Hopeless 0 0 0  PHQ - 2 Score 0 0 0    Past Medical History:  Diagnosis Date  . Allergy   . Arthritis 05/30/2015   Gout - Left knee and ankle  . Cervical pain 05/30/2015  . GERD (gastroesophageal reflux disease)   . Hyperlipidemia   . Hypertension   . 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 Wears dentures    full upper and lower (doesn't wear lower)  . Wears hearing aid    bilateral   Past Surgical History:  Procedure Laterality Date  . CATARACT EXTRACTION W/PHACO Right 06/11/2016   Procedure: CATARACT EXTRACTION PHACO AND INTRAOCULAR LENS PLACEMENT ([place]);  Surgeon: Leandrew Koyanagi, MD;  Location: [facility];  Service: Ophthalmology;  Laterality: Right;  . CATARACT EXTRACTION W/PHACO Left 07/09/2016   Procedure: CATARACT EXTRACTION PHACO AND INTRAOCULAR LENS PLACEMENT ([facility]);  Surgeon: Leandrew Koyanagi, MD;  Location: [place];  Service: Ophthalmology;  Laterality: Left;  MALYUGIN  . ESOPHAGOGASTRODUODENOSCOPY (EGD) WITH PROPOFOL N/A 06/20/2015   Procedure: ESOPHAGOGASTRODUODENOSCOPY (EGD) WITH PROPOFOL with dialation;  Surgeon: Lucilla Lame, MD;  Location: [place];  Service: Endoscopy;  Laterality: N/A;  . FLEXIBLE BRONCHOSCOPY N/A  06/22/2015   Procedure: FLEXIBLE BRONCHOSCOPY;  Surgeon: Allyne Gee, MD;  Location: [facility];  Service: Pulmonary;  Laterality: N/A;  . GASTRIC OUTLET OBSTRUCTION RELEASE  2013  . 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  . UPPER GI ENDOSCOPY     Social History   Socioeconomic History  . Marital status: Widowed    Spouse name: Not on file  . Number of children: Not on file  . Years  of education: Not on file  . Highest education level: Not on file  Social Needs  . Financial resource strain: Not on file  . Food insecurity - worry: Not on file  . Food insecurity - inability: Not on file  . Transportation needs - medical: Not on file  . Transportation needs - non-medical: Not on file  Occupational History  . Not on file  Tobacco Use  . Smoking status: Never Smoker  . Smokeless tobacco: Never Used  Substance and Sexual Activity  . Alcohol use: No  . Drug use: No  . Sexual activity: Not on file  Other Topics Concern  . Not on file  Social History Narrative  . Not on file   Family History  Problem Relation Age of Onset  . Diabetes Mother   . Heart disease Father   . Diabetes Sister   . Diabetes Brother   . Stroke Brother   . Mental illness Brother   . Hypertension Paternal Grandfather    Current Outpatient Medications on File Prior to Visit  Medication Sig  . aspirin EC 81 MG tablet Take 81 mg by mouth daily.  . Calcium Carb-Cholecalciferol (CALCIUM-VITAMIN D) 500-400 MG-UNIT TABS TAKE ONE TABLET BY MOUTH TWICE DAILY WITH MEALS  . Cholecalciferol (VITAMIN D3) 1000 units CAPS Take 1,000 Units by mouth daily.  . colchicine 0.6 MG tablet Take 1 tablet (0.6 mg total) by mouth daily.  . enalapril (VASOTEC) 20 MG tablet Take 1.5 tablets (30 mg total) by mouth daily.  . Glucosamine-Chondroit-Vit C-Mn (GLUCOSAMINE 1500 COMPLEX PO) Take 1,500 mg by mouth daily.  . Lancets (ACCU-CHEK MULTICLIX) lancets Check blood sugar twice daily. E11.9  . meclizine (ANTIVERT) 25 MG tablet Take 25 mg by mouth 3 (three) times daily as needed for dizziness.  . Multiple Vitamin (MULTIVITAMIN) tablet Take 1 tablet by mouth daily. Reported on 11/21/2015  . omeprazole (PRILOSEC) 20 MG capsule Take 1 capsule (20 mg total) by mouth 2 (two) times daily before a meal.  . Oyster Shell Calcium 500 MG TABS Take 1 tablet by mouth 2 (two) times daily with a meal.  . tamsulosin (FLOMAX) 0.4 MG  CAPS capsule Take 1 capsule (0.4 mg total) by mouth daily after breakfast.   No current facility-administered medications on file prior to visit.     Review of Systems  Constitutional: Negative for activity change, appetite change, chills, diaphoresis, fatigue, fever and unexpected weight change.  HENT: Positive for congestion. Negative for hearing loss and sinus pressure.   Eyes: Negative for visual disturbance.  Respiratory: Negative for apnea, cough, choking, chest tightness, shortness of breath and wheezing.   Cardiovascular: Negative for chest pain, palpitations and leg swelling.  Gastrointestinal: Negative for abdominal pain, anal bleeding, blood in stool, constipation, diarrhea, nausea and vomiting.  Endocrine: Negative for cold intolerance and polyuria.  Genitourinary: Negative for decreased urine volume, difficulty urinating, dysuria, frequency, hematuria and testicular pain.  Musculoskeletal: Negative for arthralgias, back pain and neck pain.  Skin: Negative for rash.  Allergic/Immunologic: Negative for environmental allergies.  Neurological: Negative  for dizziness, weakness, light-headedness, numbness and headaches.  Hematological: Negative for adenopathy.  Psychiatric/Behavioral: Negative for behavioral problems, dysphoric mood and sleep disturbance. The patient is not nervous/anxious.    Per HPI unless specifically indicated above     Objective:    BP 134/70 (BP Location: Left Arm, Cuff Size: Normal)   Pulse (!) 54   Temp 98.5 F (36.9 C) (Oral)   Resp 16   Ht 5\' 7"  (1.702 m)   Wt 153 lb (69.4 kg)   BMI 23.96 kg/m   Wt Readings from Last 3 Encounters:  10/02/17 153 lb (69.4 kg)  04/02/17 151 lb (68.5 kg)  03/31/17 151 lb 12.8 oz (68.9 kg)    Physical Exam  Constitutional: He is oriented to person, place, and time. He appears well-developed and well-nourished. No distress.  Well-appearing 76 year old male, comfortable, cooperative  HENT:  Head: Normocephalic  and atraumatic.  Mouth/Throat: Oropharynx is clear and moist.  Eyes: Conjunctivae and EOM are normal. Pupils are equal, round, and reactive to light. Right eye exhibits no discharge. Left eye exhibits no discharge.  Neck: Normal range of motion. Neck supple. No thyromegaly present.  Spurling's Maneuver negative R Neck full ROM  Cardiovascular: Normal rate, regular rhythm, normal heart sounds and intact distal pulses.  No murmur heard. Pulmonary/Chest: Effort normal and breath sounds normal. No respiratory distress. He has no wheezes. He has no rales.  Abdominal: Soft. Bowel sounds are normal. He exhibits no distension and no mass. There is no tenderness.  Musculoskeletal: Normal range of motion. He exhibits no edema or tenderness.  Upper / Lower Extremities: - Normal muscle tone, strength bilateral upper extremities 5/5, lower extremities 5/5  R Shoulder Inspection: Normal appearance bilateral symmetrical Palpation: Non-tender to palpation over anterior, lateral, or posterior shoulder  ROM: Full intact active ROM forward flexion, abduction, internal / external rotation, symmetrical Special Testing: Rotator cuff testing negative for weakness with supraspinatus full can and empty can test, Hawkin's AC impingement negative for pain Strength: Normal strength 5/5 flex/ext, ext rot / int rot, grip, rotator cuff str testing. Neurovascular: Distally intact pulses, sensation to light touch  Lymphadenopathy:    He has no cervical adenopathy.  Neurological: He is alert and oriented to person, place, and time.  Distal sensation intact to light touch all extremities  Skin: Skin is warm and dry. No rash noted. He is not diaphoretic. No erythema.  Small ecchymosis resolving R hand  Psychiatric: He has a normal mood and affect. His behavior is normal.  Well groomed, good eye contact, normal speech and thoughts  Nursing note and vitals reviewed.   Results for orders placed or performed in visit on  09/23/17  VITAMIN D 25 Hydroxy (Vit-D Deficiency, Fractures)  Result Value Ref Range   Vit D, 25-Hydroxy 35 30 - 100 ng/mL  PSA, Total with Reflex to PSA, Free  Result Value Ref Range   PSA, Total 1.3 < OR = 4.0 ng/mL  Hemoglobin A1c  Result Value Ref Range   Hgb A1c MFr Bld 5.4 <5.7 % of total Hgb   Mean Plasma Glucose 108 (calc)   eAG (mmol/L) 6.0 (calc)  CBC with Differential/Platelet  Result Value Ref Range   WBC 6.6 3.8 - 10.8 Thousand/uL   RBC 4.89 4.20 - 5.80 Million/uL   Hemoglobin 15.4 13.2 - 17.1 g/dL   HCT 45.1 38.5 - 50.0 %   MCV 92.2 80.0 - 100.0 fL   MCH 31.5 27.0 - 33.0 pg   MCHC  34.1 32.0 - 36.0 g/dL   RDW 12.6 11.0 - 15.0 %   Platelets 145 140 - 400 Thousand/uL   MPV 10.4 7.5 - 12.5 fL   Neutro Abs 4,772 1,500 - 7,800 cells/uL   Lymphs Abs 1,010 850 - 3,900 cells/uL   WBC mixed population 541 200 - 950 cells/uL   Eosinophils Absolute 238 15 - 500 cells/uL   Basophils Absolute 40 0 - 200 cells/uL   Neutrophils Relative % 72.3 %   Total Lymphocyte 15.3 %   Monocytes Relative 8.2 %   Eosinophils Relative 3.6 %   Basophils Relative 0.6 %  Lipid panel  Result Value Ref Range   Cholesterol 151 <200 mg/dL   HDL 44 >40 mg/dL   Triglycerides 108 <150 mg/dL   LDL Cholesterol (Calc) 87 mg/dL (calc)   Total CHOL/HDL Ratio 3.4 <5.0 (calc)   Non-HDL Cholesterol (Calc) 107 <130 mg/dL (calc)  COMPLETE METABOLIC PANEL WITH GFR  Result Value Ref Range   Glucose, Bld 95 65 - 99 mg/dL   BUN 14 7 - 25 mg/dL   Creat 0.76 0.70 - 1.18 mg/dL   GFR, Est Non African American 87 > OR = 60 mL/min/1.76m2   GFR, Est African American 101 > OR = 60 mL/min/1.62m2   BUN/Creatinine Ratio NOT APPLICABLE 6 - 22 (calc)   Sodium 141 135 - 146 mmol/L   Potassium 3.6 3.5 - 5.3 mmol/L   Chloride 102 98 - 110 mmol/L   CO2 32 20 - 32 mmol/L   Calcium 9.4 8.6 - 10.3 mg/dL   Total Protein 6.4 6.1 - 8.1 g/dL   Albumin 4.0 3.6 - 5.1 g/dL   Globulin 2.4 1.9 - 3.7 g/dL (calc)   AG Ratio 1.7  1.0 - 2.5 (calc)   Total Bilirubin 1.3 (H) 0.2 - 1.2 mg/dL   Alkaline phosphatase (APISO) 106 40 - 115 U/L   AST 21 10 - 35 U/L   ALT 17 9 - 46 U/L      Assessment & Plan:   Problem List Items Addressed This Visit    Blood glucose elevated    Controlled with lifestyle. History of abnormal glucose but clinically normal range A1c, without dx Pre-DM - Concern with fam history  Plan:  1. Not on any therapy currently  2. Encourage improved lifestyle - low carb, low sugar diet, do not need to restrict portion size 3. Follow-up yearly A1c      Relevant Medications   glucose blood (ACCU-CHEK AVIVA PLUS) test strip   Bradycardia    Stable chronic bradycardia Asymptomatic Continue current meds, avoid BB      Essential (primary) hypertension    Well-controlled HTN - Home BP readings normal, very detailed log  No known complications   Plan:  1.  Continue current BP regimen - Enalapril 30mg  daily 2. Encourage improved lifestyle - low sodium diet, regular exercise 3. Continue monitor BP outside office, bring readings to next visit, if persistently >140/90 or new symptoms notify office sooner 4. Follow-up q 6 months      Hyperlipidemia    Controlled cholesterol on lifestyle Last lipid panel 08/2017 Calculated ASCVD 10 yr risk score elevated due to age, HTN  Plan: 1. Briefly reviewed ASCVD risk - consider low dose statin for future risk reduction, but agree to defer for now, patient not interested in starting new med 2. Continue ASA 81mg  for primary ASCVD risk reduction 3. Encourage improved lifestyle - low carb/cholesterol, reduce portion size, continue improving regular  exercise 4. Follow-up yearly lipids      Vitamin D deficiency    Normalized Vit D Continue Vitamin D3 1,000+ daily       Other Visit Diagnoses    Annual physical exam    -  Primary      Meds ordered this encounter  Medications  . glucose blood (ACCU-CHEK AVIVA PLUS) test strip    Sig: Check blood  sugar up to 1x daily or less. Dx. E11.9    Dispense:  100 each    Refill:  12    Follow up plan: Return in about 6 months (around 04/02/2018) for HTN.  [name], [facility] 10/02/2017, 9:52 AM

## 2017-10-02 NOTE — Assessment & Plan Note (Signed)
Normalized Vit D Continue Vitamin D3 1,000+ daily

## 2017-10-02 NOTE — Patient Instructions (Addendum)
Thank you for coming to the clinic today.  1. Keep up the great work!  2. Keep checking BP, sugar, weight  3. No med changes  Refilled test strips  Please schedule a Follow-up Appointment to: Return in about 6 months (around 04/02/2018) for HTN.  If you have any other questions or concerns, please feel free to call the clinic or send a message through [place]. You may also schedule an earlier appointment if necessary.  Additionally, you may be receiving a survey about your experience at our clinic within a few days to 1 week by e-mail or mail. We value your feedback.  [name], DO [place]

## 2017-10-02 NOTE — Assessment & Plan Note (Signed)
Well-controlled HTN - Home BP readings normal, very detailed log  No known complications   Plan:  1.  Continue current BP regimen - Enalapril 30mg daily 2. Encourage improved lifestyle - low sodium diet, regular exercise 3. Continue monitor BP outside office, bring readings to next visit, if persistently >140/90 or new symptoms notify office sooner 4. Follow-up q 6 months 

## 2017-12-01 DIAGNOSIS — Z961 Presence of intraocular lens: Secondary | ICD-10-CM | POA: Diagnosis not present

## 2017-12-01 DIAGNOSIS — H52223 Regular astigmatism, bilateral: Secondary | ICD-10-CM | POA: Diagnosis not present

## 2017-12-01 DIAGNOSIS — H5203 Hypermetropia, bilateral: Secondary | ICD-10-CM | POA: Diagnosis not present

## 2017-12-01 DIAGNOSIS — H524 Presbyopia: Secondary | ICD-10-CM | POA: Diagnosis not present

## 2018-01-21 DIAGNOSIS — M25532 Pain in left wrist: Secondary | ICD-10-CM | POA: Diagnosis not present

## 2018-01-21 DIAGNOSIS — M1180 Other specified crystal arthropathies, unspecified site: Secondary | ICD-10-CM | POA: Diagnosis not present

## 2018-01-21 DIAGNOSIS — G8929 Other chronic pain: Secondary | ICD-10-CM | POA: Diagnosis not present

## 2018-04-02 ENCOUNTER — Ambulatory Visit (INDEPENDENT_AMBULATORY_CARE_PROVIDER_SITE_OTHER): Payer: Medicare Other | Admitting: Family Medicine

## 2018-04-02 ENCOUNTER — Encounter: Payer: Self-pay | Admitting: Family Medicine

## 2018-04-02 ENCOUNTER — Other Ambulatory Visit: Payer: Self-pay | Admitting: Family Medicine

## 2018-04-02 VITALS — BP 120/54 | HR 48 | Temp 98.9°F | Resp 16 | Ht 67.0 in | Wt 152.0 lb

## 2018-04-02 DIAGNOSIS — N401 Enlarged prostate with lower urinary tract symptoms: Secondary | ICD-10-CM | POA: Diagnosis not present

## 2018-04-02 DIAGNOSIS — I10 Essential (primary) hypertension: Secondary | ICD-10-CM

## 2018-04-02 DIAGNOSIS — G5601 Carpal tunnel syndrome, right upper limb: Secondary | ICD-10-CM | POA: Diagnosis not present

## 2018-04-02 DIAGNOSIS — M150 Primary generalized (osteo)arthritis: Secondary | ICD-10-CM

## 2018-04-02 DIAGNOSIS — K2100 Gastro-esophageal reflux disease with esophagitis, without bleeding: Secondary | ICD-10-CM

## 2018-04-02 DIAGNOSIS — N138 Other obstructive and reflux uropathy: Secondary | ICD-10-CM

## 2018-04-02 DIAGNOSIS — Z0000 Encounter for general adult medical examination without abnormal findings: Secondary | ICD-10-CM

## 2018-04-02 DIAGNOSIS — M159 Polyosteoarthritis, unspecified: Secondary | ICD-10-CM

## 2018-04-02 DIAGNOSIS — M1189 Other specified crystal arthropathies, multiple sites: Secondary | ICD-10-CM

## 2018-04-02 DIAGNOSIS — R739 Hyperglycemia, unspecified: Secondary | ICD-10-CM

## 2018-04-02 DIAGNOSIS — J3089 Other allergic rhinitis: Secondary | ICD-10-CM

## 2018-04-02 DIAGNOSIS — G894 Chronic pain syndrome: Secondary | ICD-10-CM

## 2018-04-02 DIAGNOSIS — E559 Vitamin D deficiency, unspecified: Secondary | ICD-10-CM

## 2018-04-02 DIAGNOSIS — E782 Mixed hyperlipidemia: Secondary | ICD-10-CM

## 2018-04-02 MED ORDER — ENALAPRIL MALEATE 20 MG PO TABS: 30.0000 mg | ORAL_TABLET | Freq: Every day | ORAL | 3 refills | Status: DC

## 2018-04-02 MED ORDER — TAMSULOSIN HCL 0.4 MG PO CAPS: 0.4000 mg | ORAL_CAPSULE | Freq: Every day | ORAL | 3 refills | Status: DC

## 2018-04-02 MED ORDER — OMEPRAZOLE 20 MG PO CPDR: 20.0000 mg | DELAYED_RELEASE_CAPSULE | Freq: Two times a day (BID) | ORAL | 3 refills | Status: DC

## 2018-04-02 MED ORDER — COLCHICINE 0.6 MG PO TABS: 0.6000 mg | ORAL_TABLET | Freq: Every day | ORAL | 3 refills | Status: DC

## 2018-04-02 MED ORDER — FLUTICASONE PROPIONATE 50 MCG/ACT NA SUSP: 2.0000 | Freq: Every day | NASAL | 3 refills | Status: DC

## 2018-04-02 NOTE — Patient Instructions (Addendum)
Thank you for coming to the office today.  For sinuses Start nasal steroid Flonase 2 sprays in each nostril daily for 4-6 weeks, may repeat course seasonally or as needed  For wrist, carpal tunnel Start wrist brace / splint to avoid bending/flexing wrist to keep it supported especially at night In future we can try nerve medicine  All medicines refilled today  DUE for FASTING BLOOD WORK (no food or drink after midnight before the lab appointment, only water or coffee without cream/sugar on the morning of)  SCHEDULE "Lab Only" visit in the morning at the clinic for lab draw in 6 MONTHS   - Make sure Lab Only appointment is at about 1 week before your next appointment, so that results will be available  For Lab Results, once available within 2-3 days of blood draw, you can can log in to MyChart online to view your results and a brief explanation. Also, we can discuss results at next follow-up visit.   Please schedule a Follow-up Appointment to: Return in about 6 months (around 10/02/2018) for Annual Physical.  If you have any other questions or concerns, please feel free to call the office or send a message through [place]. You may also schedule an earlier appointment if necessary.  Additionally, you may be receiving a survey about your experience at our office within a few days to 1 week by e-mail or mail. We value your feedback.  [name], DO [place]

## 2018-04-02 NOTE — Progress Notes (Signed)
Subjective:    Patient ID: Zachary Brewer, male    DOB: 1940-11-22, 77 y.o.   MRN: [id number]  Zachary Brewer is a 77 y.o. male presenting on 04/02/2018 for Hypertension   HPI   Rhinosinusitis, Chronic Reports chronic thick sinus drainage over past >2 years by his report, seems to cough it up during day, and often makes his throat a little sore from drainage and some coughing up phlegm, he has a pet dog at home unsure if any other possible allergies. He takes a sinus decongestant as needed and has tried anti-histamine in past but unsure which. He has tried Flonase or other nose spray long time ago, would like to restart this.  Carpal Tunnel Syndrome, Right Wrist Prior traumatic injury >1 year ago with accidental fall and landed on R wrist/hand, he used to have similar problem though however >50 years ago used to have symptoms of R hand/wrist numbness if holding arm elevated and driving especially. Now he has similar symptoms if sleep in wrong position or with arm down, position changes it, not constant, does not have significant pain, has a wrist brace but doesn't wear it.  CHRONIC HTN: Reports no new concerns. Doing well. Has detailed home BP log daily with appropriate range 110-130s / 70-80s Current Meds - Enalapril 30mg  (takes 1.5 of the 20mg  tabs)   Reports good compliance, took meds today. Tolerating well, w/o complaints. - Admits prior episode of dizziness for 3-4 days (>1 year ago with off balance, since resolved) Denies CP, dyspnea, HA, edema, lightheadedness, vertigo  Elevated Blood Sugar: No concerns. Doing well, balanced diet, family history of Diabetes (brother, sister, and mother). He has never been diagnosed with Pre-DM highest prior A1c 5.4 CBGs: Avg 90-95, Low none, High 129. Checks CBGs x 1 weekly - has detailed log today Meds: None (never) Currently on ACEi Lifestyle: - Diet: Not specifically limiting carbs in diet, eats balanced. Now due to no dentures eating pureed diet,  see below  - Exercise: regular activity some walking Denies hypoglycemia, polyuria, visual changes, numbness or tingling.  History of Gastric Outlet Obstruction (S/p Roux-en-Y surgery) / History of weight loss / GERD: - Briefly reviews prior history of s/p gastric surgery in past see prior note for background information - Today doing well, he measures weight every day, keeps log, stable 146-148, our scale measures slightly higher - Tolerating diet, has dentures, stays well hydrated - Taking Omeprazole 20mg  BID for GERD - Denies any dark stool, rectal bleeding or blood in stool, abdominal pain, nausea or vomiting  BPH Reports chronic problem. Doing well on Flomax 0.4mg  daily, due for refill.  Pseudogout Arthropathy (CPDD) - History of multiple joint pain and OA/DJD and Gouty arthritis (wrists and knee). Now he is following, [facility] (Dr Annalee Genta), controlled on regular Tylenol dosing and preventative Colchicine daily.   Depression screen [facility] 2/9 04/02/2018 10/02/2017 03/31/2017  Decreased Interest 0 0 0  Down, Depressed, Hopeless 0 0 0  PHQ - 2 Score 0 0 0    Social History   Tobacco Use  . Smoking status: Never Smoker  . Smokeless tobacco: Never Used  Substance Use Topics  . Alcohol use: No  . Drug use: No    Review of Systems Per HPI unless specifically indicated above     Objective:    BP (!) 120/54 Comment: manual  Pulse (!) 48   Temp 98.9 F (37.2 C) (Oral)   Resp 16   Ht 5\' 7"  (1.702  m)   Wt 152 lb (68.9 kg)   BMI 23.81 kg/m   Wt Readings from Last 3 Encounters:  04/02/18 152 lb (68.9 kg)  10/02/17 153 lb (69.4 kg)  04/02/17 151 lb (68.5 kg)    Physical Exam  Constitutional: He is oriented to person, place, and time. He appears well-developed and well-nourished. No distress.  Well-appearing 77 year old male, comfortable, cooperative  HENT:  Head: Normocephalic and atraumatic.  Mouth/Throat: Oropharynx is clear and moist.  Slightly hoarse voice  today  Frontal / maxillary sinuses non-tender. Nares patent without purulence or edema. Bilateral TMs with very mild opaque R>L effusion without erythema or bulging. Oropharynx evidence of some posterior drainage without erythema, exudates, edema or asymmetry.  Eyes: Conjunctivae are normal. Right eye exhibits no discharge. Left eye exhibits no discharge.  Neck: Normal range of motion. Neck supple. No thyromegaly present.  Cardiovascular: Normal rate, regular rhythm, normal heart sounds and intact distal pulses.  No murmur heard. Pulmonary/Chest: Effort normal and breath sounds normal. No respiratory distress. He has no wheezes. He has no rales.  Musculoskeletal: Normal range of motion. He exhibits no edema.  Lymphadenopathy:    He has no cervical adenopathy.  Neurological: He is alert and oriented to person, place, and time.  Skin: Skin is warm and dry. No rash noted. He is not diaphoretic. No erythema.  Psychiatric: He has a normal mood and affect. His behavior is normal.  Well groomed, good eye contact, normal speech and thoughts  Nursing note and vitals reviewed.  Results for orders placed or performed in visit on 09/23/17  VITAMIN D 25 Hydroxy (Vit-D Deficiency, Fractures)  Result Value Ref Range   Vit D, 25-Hydroxy 35 30 - 100 ng/mL  PSA, Total with Reflex to PSA, Free  Result Value Ref Range   PSA, Total 1.3 < OR = 4.0 ng/mL  Hemoglobin A1c  Result Value Ref Range   Hgb A1c MFr Bld 5.4 <5.7 % of total Hgb   Mean Plasma Glucose 108 (calc)   eAG (mmol/L) 6.0 (calc)  CBC with Differential/Platelet  Result Value Ref Range   WBC 6.6 3.8 - 10.8 Thousand/uL   RBC 4.89 4.20 - 5.80 Million/uL   Hemoglobin 15.4 13.2 - 17.1 g/dL   HCT 45.1 38.5 - 50.0 %   MCV 92.2 80.0 - 100.0 fL   MCH 31.5 27.0 - 33.0 pg   MCHC 34.1 32.0 - 36.0 g/dL   RDW 12.6 11.0 - 15.0 %   Platelets 145 140 - 400 Thousand/uL   MPV 10.4 7.5 - 12.5 fL   Neutro Abs 4,772 1,500 - 7,800 cells/uL   Lymphs Abs  1,010 850 - 3,900 cells/uL   WBC mixed population 541 200 - 950 cells/uL   Eosinophils Absolute 238 15 - 500 cells/uL   Basophils Absolute 40 0 - 200 cells/uL   Neutrophils Relative % 72.3 %   Total Lymphocyte 15.3 %   Monocytes Relative 8.2 %   Eosinophils Relative 3.6 %   Basophils Relative 0.6 %  Lipid panel  Result Value Ref Range   Cholesterol 151 <200 mg/dL   HDL 44 >40 mg/dL   Triglycerides 108 <150 mg/dL   LDL Cholesterol (Calc) 87 mg/dL (calc)   Total CHOL/HDL Ratio 3.4 <5.0 (calc)   Non-HDL Cholesterol (Calc) 107 <130 mg/dL (calc)  COMPLETE METABOLIC PANEL WITH GFR  Result Value Ref Range   Glucose, Bld 95 65 - 99 mg/dL   BUN 14 7 - 25 mg/dL  Creat 0.76 0.70 - 1.18 mg/dL   GFR, Est Non African American 87 > OR = 60 mL/min/1.58m2   GFR, Est African American 101 > OR = 60 mL/min/1.21m2   BUN/Creatinine Ratio NOT APPLICABLE 6 - 22 (calc)   Sodium 141 135 - 146 mmol/L   Potassium 3.6 3.5 - 5.3 mmol/L   Chloride 102 98 - 110 mmol/L   CO2 32 20 - 32 mmol/L   Calcium 9.4 8.6 - 10.3 mg/dL   Total Protein 6.4 6.1 - 8.1 g/dL   Albumin 4.0 3.6 - 5.1 g/dL   Globulin 2.4 1.9 - 3.7 g/dL (calc)   AG Ratio 1.7 1.0 - 2.5 (calc)   Total Bilirubin 1.3 (H) 0.2 - 1.2 mg/dL   Alkaline phosphatase (APISO) 106 40 - 115 U/L   AST 21 10 - 35 U/L   ALT 17 9 - 46 U/L      Assessment & Plan:   Problem List Items Addressed This Visit    Allergic rhinitis Not consistent with bacterial sinusitis at this time Start nasal steroid Flonase 2 sprays in each nostril daily for 4-6 weeks, may repeat course seasonally or as needed    BPH with obstruction/lower urinary tract symptoms Stable Controlled on alpha blocker, refill flomax   Relevant Medications   tamsulosin (FLOMAX) 0.4 MG CAPS capsule   Calcium pyrophosphate arthropathy of multiple sites Controlled without recent flare Followed by [facility] Refilled Colchicine    Relevant Medications   colchicine 0.6 MG tablet   Carpal  tunnel syndrome on right Chronic problem, recent flare based on position and repetitive activity, worse overnight Recommend wearing wrist brace/splint at night and w/ activity Continue other medications Future reconsider other medication options vs     Esophagitis, reflux   Relevant Medications   omeprazole (PRILOSEC) 20 MG capsule   Essential (primary) hypertension - Primary  Well-controlled HTN - Home BP readings - detailed log, normal  No known complications    Plan:  1. ontinue current BP regimen - Enalapril 30mg  daily 2. Encourage improved lifestyle - low sodium diet, regular exercise 3. Continue monitor BP outside office, bring readings to next visit, if persistently >140/90 or new symptoms notify office sooner 4. Follow-up 6 months annual phys labs    Relevant Medications   enalapril (VASOTEC) 20 MG tablet   Osteoarthritis of multiple joints Stable without flare up Followed by [facility]    Relevant Medications   colchicine 0.6 MG tablet      Meds ordered this encounter  Medications  . fluticasone (FLONASE) 50 MCG/ACT nasal spray    Sig: Place 2 sprays into both nostrils daily. Use for 4-6 weeks then stop and use seasonally or as needed.    Dispense:  16 g    Refill:  3  . enalapril (VASOTEC) 20 MG tablet    Sig: Take 1.5 tablets (30 mg total) by mouth daily.    Dispense:  135 tablet    Refill:  3  . tamsulosin (FLOMAX) 0.4 MG CAPS capsule    Sig: Take 1 capsule (0.4 mg total) by mouth daily after breakfast.    Dispense:  90 capsule    Refill:  3  . omeprazole (PRILOSEC) 20 MG capsule    Sig: Take 1 capsule (20 mg total) by mouth 2 (two) times daily before a meal.    Dispense:  180 capsule    Refill:  3  . colchicine 0.6 MG tablet    Sig: Take 1 tablet (0.6  mg total) by mouth daily.    Dispense:  90 tablet    Refill:  3     Follow up plan: Return in about 6 months (around 10/02/2018) for Annual Physical.  Future labs ordered for  09/29/18  [name], [facility] 04/02/2018, 9:20 PM

## 2018-04-19 ENCOUNTER — Telehealth: Payer: Self-pay | Admitting: Family Medicine

## 2018-04-19 DIAGNOSIS — R739 Hyperglycemia, unspecified: Secondary | ICD-10-CM

## 2018-04-19 MED ORDER — ACCU-CHEK AVIVA PLUS W/DEVICE KIT: PACK | 0 refills | Status: AC

## 2018-04-19 NOTE — Telephone Encounter (Signed)
[facility] called requesting a meter for the pt to be called into  [employer].

## 2018-05-18 ENCOUNTER — Ambulatory Visit (INDEPENDENT_AMBULATORY_CARE_PROVIDER_SITE_OTHER): Payer: Medicare Other | Admitting: Family Medicine

## 2018-05-18 ENCOUNTER — Ambulatory Visit (INDEPENDENT_AMBULATORY_CARE_PROVIDER_SITE_OTHER): Payer: Medicare Other

## 2018-05-18 ENCOUNTER — Encounter: Payer: Self-pay | Admitting: Family Medicine

## 2018-05-18 VITALS — BP 138/68 | HR 57 | Temp 97.5°F | Resp 16 | Ht 67.0 in | Wt 153.6 lb

## 2018-05-18 DIAGNOSIS — Z0000 Encounter for general adult medical examination without abnormal findings: Secondary | ICD-10-CM | POA: Diagnosis not present

## 2018-05-18 DIAGNOSIS — R1011 Right upper quadrant pain: Secondary | ICD-10-CM | POA: Diagnosis not present

## 2018-05-18 DIAGNOSIS — R110 Nausea: Secondary | ICD-10-CM | POA: Diagnosis not present

## 2018-05-18 MED ORDER — ONDANSETRON 4 MG PO TBDP: 4.0000 mg | ORAL_TABLET | Freq: Three times a day (TID) | ORAL | 0 refills | Status: DC | PRN

## 2018-05-18 MED ORDER — DICYCLOMINE HCL 10 MG PO CAPS: 10.0000 mg | ORAL_CAPSULE | Freq: Three times a day (TID) | ORAL | 0 refills | Status: DC

## 2018-05-18 NOTE — Patient Instructions (Addendum)
Thank you for coming to the office today.  I am not sure the exact cause of your abdominal pain, however I am concerned that one significant possibility could be - Gallstones, this can cause episodic gallbladder pain that you may be describing - Potentially uncontrolled Acid Reflux (GERD) and may have developed an Ulcer (Peptic Ulcer of stomach) - this seems less likely to me  We will schedule a RUQ Abdominal Ultrasound for evaluating gallbladder within a week approx, this is done at [facility] or Outpatient Imaging, you will be called with apt.  May try two medicines as needed - Try anti cramping abdominal pain medicine, Dicyclomine (Bentyl) AFTER meals as needed only  - Try Zofran ODT nausea medicine as needed only  DIET RECOMMENDATIONS - Avoid spicy, greasy, fried foods, also things like caffeine, dark chocolate, peppermint can worsen - Avoid large meals and late night snacks, also do not go more than 4-5 hours without a snack or meal (not eating will worsen reflux symptoms due to stomach acid) - You may also elevate the head of your bed at night to sleep at very slight incline to help reduce symptoms  If symptoms are worsening or persistent despite treatment or develop any different severe esophagus or abdominal pain, unable to swallow solids or liquids, nausea, vomiting especially blood in vomit, fever/chills, or unintentional weight loss / no appetite, please follow-up sooner in office or seek more immediate medical attention at hospital Emergency Department.   Please schedule a Follow-up Appointment to: Return in about 1 month (around 06/15/2018), or if symptoms worsen or fail to improve, for RUQ abdominal pain if not improved.  If you have any other questions or concerns, please feel free to call the office or send a message through [place]. You may also schedule an earlier appointment if necessary.  Additionally, you may be receiving a survey about your experience at our office  within a few days to 1 week by e-mail or mail. We value your feedback.  [name], DO [place]

## 2018-05-18 NOTE — Patient Instructions (Addendum)
Zachary Brewer , Thank you for taking time to come for your Medicare Wellness Visit. I appreciate your ongoing commitment to your health goals. Please review the following plan we discussed and let me know if I can assist you in the future.   Screening recommendations/referrals: Colonoscopy: no longer required Recommended yearly ophthalmology/optometry visit for glaucoma screening and checkup Recommended yearly dental visit for hygiene and checkup  Vaccinations: Influenza vaccine: due 06/2018 Pneumococcal vaccine: up to date  Tdap vaccine: up to date  Shingles vaccine: shingrix eligible, check with your insurance company for coverage   Advanced directives: Please bring a copy of your health care power of attorney and living will to the office at your convenience.  Conditions/risks identified:  Recommend drinking at least 6-8 glasses of water a day   Next appointment:  Follow up on 09/29/2018 at 8:00am for labs at [facility]. Follow up on 10/05/2018 at 9:00am with Dr.Karamalegos.  Follow up in one year for your annual wellness exam.   Preventive Care 79 Years and Older, Male Preventive care refers to lifestyle choices and visits with your health care provider that can promote health and wellness. What does preventive care include?  A yearly physical exam. This is also called an annual well check.  Dental exams once or twice a year.  Routine eye exams. Ask your health care provider how often you should have your eyes checked.  Personal lifestyle choices, including:  Daily care of your teeth and gums.  Regular physical activity.  Eating a healthy diet.  Avoiding tobacco and drug use.  Limiting alcohol use.  Practicing safe sex.  Taking low doses of aspirin every day.  Taking vitamin and mineral supplements as recommended by your health care provider. What happens during an annual well check? The services and screenings done by your health care provider during your annual well check  will depend on your age, overall health, lifestyle risk factors, and family history of disease. Counseling  Your health care provider may ask you questions about your:  Alcohol use.  Tobacco use.  Drug use.  Emotional well-being.  Home and relationship well-being.  Sexual activity.  Eating habits.  History of falls.  Memory and ability to understand (cognition).  Work and work Statistician. Screening  You may have the following tests or measurements:  Height, weight, and BMI.  Blood pressure.  Lipid and cholesterol levels. These may be checked every 5 years, or more frequently if you are over 70 years old.  Skin check.  Lung cancer screening. You may have this screening every year starting at age 79 if you have a 30-pack-year history of smoking and currently smoke or have quit within the past 15 years.  Fecal occult blood test (FOBT) of the stool. You may have this test every year starting at age 79.  Flexible sigmoidoscopy or colonoscopy. You may have a sigmoidoscopy every 5 years or a colonoscopy every 10 years starting at age 79.  Prostate cancer screening. Recommendations will vary depending on your family history and other risks.  Hepatitis C blood test.  Hepatitis B blood test.  Sexually transmitted disease (STD) testing.  Diabetes screening. This is done by checking your blood sugar (glucose) after you have not eaten for a while (fasting). You may have this done every 1-3 years.  Abdominal aortic aneurysm (AAA) screening. You may need this if you are a current or former smoker.  Osteoporosis. You may be screened starting at age 10 if you are at high risk.  Talk with your health care provider about your test results, treatment options, and if necessary, the need for more tests. Vaccines  Your health care provider may recommend certain vaccines, such as:  Influenza vaccine. This is recommended every year.  Tetanus, diphtheria, and acellular pertussis  (Tdap, Td) vaccine. You may need a Td booster every 10 years.  Zoster vaccine. You may need this after age 79.  Pneumococcal 13-valent conjugate (PCV13) vaccine. One dose is recommended after age 79.  Pneumococcal polysaccharide (PPSV23) vaccine. One dose is recommended after age 79. Talk to your health care provider about which screenings and vaccines you need and how often you need them. This information is not intended to replace advice given to you by your health care provider. Make sure you discuss any questions you have with your health care provider. Document Released: 11/09/2015 Document Revised: 07/02/2016 Document Reviewed: 08/14/2015 Elsevier Interactive Patient Education  2017 [place] Prevention in the Home Falls can cause injuries. They can happen to people of all ages. There are many things you can do to make your home safe and to help prevent falls. What can I do on the outside of my home?  Regularly fix the edges of walkways and driveways and fix any cracks.  Remove anything that might make you trip as you walk through a door, such as a raised step or threshold.  Trim any bushes or trees on the path to your home.  Use bright outdoor lighting.  Clear any walking paths of anything that might make someone trip, such as rocks or tools.  Regularly check to see if handrails are loose or broken. Make sure that both sides of any steps have handrails.  Any raised decks and porches should have guardrails on the edges.  Have any leaves, snow, or ice cleared regularly.  Use sand or salt on walking paths during winter.  Clean up any spills in your garage right away. This includes oil or grease spills. What can I do in the bathroom?  Use night lights.  Install grab bars by the toilet and in the tub and shower. Do not use towel bars as grab bars.  Use non-skid mats or decals in the tub or shower.  If you need to sit down in the shower, use a plastic, non-slip  stool.  Keep the floor dry. Clean up any water that spills on the floor as soon as it happens.  Remove soap buildup in the tub or shower regularly.  Attach bath mats securely with double-sided non-slip rug tape.  Do not have throw rugs and other things on the floor that can make you trip. What can I do in the bedroom?  Use night lights.  Make sure that you have a light by your bed that is easy to reach.  Do not use any sheets or blankets that are too big for your bed. They should not hang down onto the floor.  Have a firm chair that has side arms. You can use this for support while you get dressed.  Do not have throw rugs and other things on the floor that can make you trip. What can I do in the kitchen?  Clean up any spills right away.  Avoid walking on wet floors.  Keep items that you use a lot in easy-to-reach places.  If you need to reach something above you, use a strong step stool that has a grab bar.  Keep electrical cords out of the way.  Do not use floor polish or wax that makes floors slippery. If you must use wax, use non-skid floor wax.  Do not have throw rugs and other things on the floor that can make you trip. What can I do with my stairs?  Do not leave any items on the stairs.  Make sure that there are handrails on both sides of the stairs and use them. Fix handrails that are broken or loose. Make sure that handrails are as long as the stairways.  Check any carpeting to make sure that it is firmly attached to the stairs. Fix any carpet that is loose or worn.  Avoid having throw rugs at the top or bottom of the stairs. If you do have throw rugs, attach them to the floor with carpet tape.  Make sure that you have a light switch at the top of the stairs and the bottom of the stairs. If you do not have them, ask someone to add them for you. What else can I do to help prevent falls?  Wear shoes that:  Do not have high heels.  Have rubber bottoms.  Are  comfortable and fit you well.  Are closed at the toe. Do not wear sandals.  If you use a stepladder:  Make sure that it is fully opened. Do not climb a closed stepladder.  Make sure that both sides of the stepladder are locked into place.  Ask someone to hold it for you, if possible.  Clearly mark and make sure that you can see:  Any grab bars or handrails.  First and last steps.  Where the edge of each step is.  Use tools that help you move around (mobility aids) if they are needed. These include:  Canes.  Walkers.  Scooters.  Crutches.  Turn on the lights when you go into a dark area. Replace any light bulbs as soon as they burn out.  Set up your furniture so you have a clear path. Avoid moving your furniture around.  If any of your floors are uneven, fix them.  If there are any pets around you, be aware of where they are.  Review your medicines with your doctor. Some medicines can make you feel dizzy. This can increase your chance of falling. Ask your doctor what other things that you can do to help prevent falls. This information is not intended to replace advice given to you by your health care provider. Make sure you discuss any questions you have with your health care provider. Document Released: 08/09/2009 Document Revised: 03/20/2016 Document Reviewed: 11/17/2014 Elsevier Interactive Patient Education  2017 [employer].

## 2018-05-18 NOTE — Progress Notes (Signed)
Subjective:    Patient ID: Zachary Brewer, male    DOB: [birth year], 77 y.o.   MRN: [id number]  Zachary Brewer is a 77 y.o. male presenting on 05/18/2018 for Abdominal Pain (right side )  Patient presents for a same day appointment. Additionally patient was seen by Tyler Aas LPN today for AMW but he also requested to be added to my schedule for acute visit for new problem.  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 He eats occasional greasy foods, tries to limit this, does a lot of meal prep and frozen foods. - No prior history of similar abdominal pain. No known history of cholelithiasis. No recent imaging. - He admits bowels are moving well - History of GERD but states this dose not feel like GERD, takes Omeprazole 20mg  daily and this works well for him - Denies any constipation or diarrhea, dark stool or blood in stool, nausea, vomiting, fevers or chills  Past Surgical History:  Procedure Laterality Date  . CATARACT EXTRACTION W/PHACO Right 06/11/2016   Procedure: CATARACT EXTRACTION PHACO AND INTRAOCULAR LENS PLACEMENT ([place]);  Surgeon: Leandrew Koyanagi, MD;  Location: [place];  Service: Ophthalmology;  Laterality: Right;  . CATARACT EXTRACTION W/PHACO Left 07/09/2016   Procedure: CATARACT EXTRACTION PHACO AND INTRAOCULAR LENS PLACEMENT ([facility]);  Surgeon: Leandrew Koyanagi, MD;  Location: [place];  Service: Ophthalmology;  Laterality: Left;  MALYUGIN  . ESOPHAGOGASTRODUODENOSCOPY (EGD) WITH PROPOFOL N/A 06/20/2015   Procedure: ESOPHAGOGASTRODUODENOSCOPY (EGD) WITH PROPOFOL with dialation;  Surgeon: Lucilla Lame, MD;  Location: [place];  Service: Endoscopy;  Laterality: N/A;  . FLEXIBLE BRONCHOSCOPY N/A 06/22/2015   Procedure: FLEXIBLE BRONCHOSCOPY;  Surgeon: Allyne Gee, MD;  Location: [facility];  Service: Pulmonary;  Laterality: N/A;  . GASTRIC OUTLET OBSTRUCTION RELEASE  2013  . 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  . UPPER GI ENDOSCOPY       Depression screen [facility] 2/9 05/18/2018 05/18/2018 04/02/2018  Decreased Interest 0 0 0  Down, Depressed, Hopeless 0 0 0  PHQ - 2 Score 0 0 0    Social History   Tobacco Use  . Smoking status: Never Smoker  . Smokeless tobacco: Never Used  Substance Use Topics  . Alcohol use: No  . Drug use: No    Review of Systems Per HPI unless specifically indicated above     Objective:    BP 138/68   Pulse (!) 57   Temp (!) 97.5 F (36.4 C) (Oral)   Resp 16   Ht 5\' 7"  (1.702 m)   Wt 153 lb 9.6 oz (69.7 kg)   BMI 24.06 kg/m   Wt Readings from Last 3 Encounters:  05/18/18 153 lb 9.6 oz (69.7 kg)  05/18/18 153 lb 9.6 oz (69.7 kg)  04/02/18 152 lb (68.9 kg)    Physical Exam  Constitutional: He is oriented to person, place, and time. He appears well-developed and well-nourished. No distress.  Well-appearing, comfortable able to stand and change positions and lay down without any difficulty or symptoms, cooperative  HENT:  Head: Normocephalic and atraumatic.  Mouth/Throat: Oropharynx is clear and moist.  Eyes: Conjunctivae are normal. Right eye exhibits no discharge. Left eye exhibits no discharge.  Neck: Normal range of motion. Neck supple. No thyromegaly present.  Cardiovascular: Normal rate, regular rhythm, normal heart sounds and intact distal pulses.  No murmur heard. Pulmonary/Chest: Effort normal and breath sounds normal. No respiratory distress. He has no wheezes. He has no rales.  Abdominal: Bowel sounds are normal. He exhibits no distension and no mass. There is no hepatosplenomegaly. There is no tenderness (No reproduced RUQ  tenderness, non tender over lower ribs.). There is no rebound, no guarding, no CVA tenderness, no tenderness at McBurney's point and negative Murphy's sign. No hernia.  Musculoskeletal: Normal range of motion. He exhibits no edema.  Lymphadenopathy:    He has no cervical adenopathy.  Neurological: He is alert and oriented to person, place, and time.  Skin: Skin is warm and dry. No rash noted. He is not diaphoretic. No erythema.  Psychiatric: He has a normal mood and affect. His behavior is normal.  Well groomed, good eye contact, normal speech and thoughts  Nursing note and vitals reviewed.  Results for orders placed or performed in visit on 09/23/17  VITAMIN D 25 Hydroxy (Vit-D Deficiency, Fractures)  Result Value Ref Range   Vit D, 25-Hydroxy 35 30 - 100 ng/mL  PSA, Total with Reflex to PSA, Free  Result Value Ref Range   PSA, Total 1.3 < OR = 4.0 ng/mL  Hemoglobin A1c  Result Value Ref Range   Hgb A1c MFr Bld 5.4 <5.7 % of total Hgb   Mean Plasma Glucose 108 (calc)   eAG (mmol/L) 6.0 (calc)  CBC with Differential/Platelet  Result Value Ref Range   WBC 6.6 3.8 - 10.8 Thousand/uL   RBC 4.89 4.20 - 5.80 Million/uL   Hemoglobin 15.4 13.2 - 17.1 g/dL   HCT 45.1 38.5 - 50.0 %   MCV 92.2 80.0 - 100.0 fL   MCH 31.5 27.0 - 33.0 pg   MCHC 34.1 32.0 - 36.0 g/dL   RDW 12.6 11.0 - 15.0 %   Platelets 145 140 - 400 Thousand/uL   MPV 10.4 7.5 - 12.5 fL   Neutro Abs 4,772 1,500 - 7,800 cells/uL   Lymphs Abs 1,010 850 - 3,900 cells/uL   WBC mixed population 541 200 - 950 cells/uL   Eosinophils Absolute 238 15 - 500 cells/uL   Basophils Absolute 40 0 - 200 cells/uL   Neutrophils Relative % 72.3 %   Total Lymphocyte 15.3 %   Monocytes Relative 8.2 %   Eosinophils Relative 3.6 %   Basophils Relative 0.6 %  Lipid panel  Result Value Ref Range   Cholesterol 151 <200 mg/dL   HDL 44 >40 mg/dL   Triglycerides 108 <150 mg/dL   LDL Cholesterol (Calc) 87 mg/dL (calc)   Total CHOL/HDL Ratio  3.4 <5.0 (calc)   Non-HDL Cholesterol (Calc) 107 <130 mg/dL (calc)  COMPLETE METABOLIC PANEL WITH GFR  Result Value Ref Range   Glucose, Bld 95 65 - 99 mg/dL   BUN 14 7 - 25 mg/dL   Creat 0.76 0.70 - 1.18 mg/dL   GFR, Est Non African American 87 > OR = 60 mL/min/1.39m2   GFR, Est African American 101 > OR = 60 mL/min/1.8m2   BUN/Creatinine Ratio NOT APPLICABLE 6 - 22 (calc)   Sodium 141 135 - 146 mmol/L   Potassium 3.6 3.5 - 5.3 mmol/L   Chloride 102 98 - 110 mmol/L   CO2 32 20 - 32 mmol/L  Calcium 9.4 8.6 - 10.3 mg/dL   Total Protein 6.4 6.1 - 8.1 g/dL   Albumin 4.0 3.6 - 5.1 g/dL   Globulin 2.4 1.9 - 3.7 g/dL (calc)   AG Ratio 1.7 1.0 - 2.5 (calc)   Total Bilirubin 1.3 (H) 0.2 - 1.2 mg/dL   Alkaline phosphatase (APISO) 106 40 - 115 U/L   AST 21 10 - 35 U/L   ALT 17 9 - 46 U/L      Assessment & Plan:   Problem List Items Addressed This Visit    None    Visit Diagnoses    RUQ abdominal pain    -  Primary   Relevant Medications   dicyclomine (BENTYL) 10 MG capsule   Other Relevant Orders   US Abdomen Limited RUQ   Postprandial abdominal pain in right upper quadrant       Relevant Medications   dicyclomine (BENTYL) 10 MG capsule   Other Relevant Orders   US Abdomen Limited RUQ   Nausea       Relevant Medications   ondansetron (ZOFRAN ODT) 4 MG disintegrating tablet   Other Relevant Orders   US Abdomen Limited RUQ      Suspected cholelithiasis with recurrent RUQ colicky episodes based on history, seems post prandial. No prior diagnosis. Some history of GERD, but history not consistent with GERD/PUD based on timing, location and description. Not exertional, no history of PAD or vasculopathy - Asymptomatic today, benign abdomen - seems improved, last episode 1-2 days ago more mild - No other significant GI red flags (no unintentional wt loss, night-sweats, refractory abdominal pain n/v, hematemesis or melena).  Plan: 1. Discussed differential - will start with  work-up for RUQ GB - ordered abdominal US - to be [facility] - Given infrequent symptoms and asymptomatic currently will defer lab testing today 2. Empiric trial on Dicyclomine PRN pain / Zofran ODT PRN nausea 3. Continue PPI daily for GERD 4. Diet modifications Cholelithiasis/GERD given 5. Return criteria reviewed 6. Follow-up 4 weeks if not improved   Meds ordered this encounter  Medications  . dicyclomine (BENTYL) 10 MG capsule    Sig: Take 1 capsule (10 mg total) by mouth 3 (three) times daily before meals. As needed only after meals    Dispense:  30 capsule    Refill:  0  . ondansetron (ZOFRAN ODT) 4 MG disintegrating tablet    Sig: Take 1 tablet (4 mg total) by mouth every 8 (eight) hours as needed for nausea or vomiting.    Dispense:  20 tablet    Refill:  0     Follow up plan: Return in about 1 month (around 06/15/2018), or if symptoms worsen or fail to improve, for RUQ abdominal pain if not improved.   [name], [facility] 05/18/2018, 10:12 AM

## 2018-05-18 NOTE — Progress Notes (Signed)
Subjective:   Zachary Brewer is a 77 y.o. male who presents for Medicare Annual/Subsequent preventive examination.  Review of Systems:  Cardiac Risk Factors include: advanced age (>46mn, >>7women);hypertension;dyslipidemia;male gender     Objective:    Vitals: BP 138/68 (BP Location: Left Arm, Patient Position: Sitting)   Pulse (!) 57   Temp (!) 97.5 F (36.4 C) (Oral)   Resp 16   Ht '5\' 7"'  (1.702 m)   Wt 153 lb 9.6 oz (69.7 kg)   BMI 24.06 kg/m   Body mass index is 24.06 kg/m.  Advanced Directives 05/18/2018 03/31/2017 07/09/2016 07/02/2016 06/11/2016 05/02/2016 02/14/2016  Does Patient Have a Medical Advance Directive? Yes Yes Yes Yes Yes Yes Yes  Type of AParamedicof AM[place]Living will - Healthcare Power of [place]Living will [place] will [place] will -  Does patient want to make changes to medical advance directive? - Yes (MAU/Ambulatory/Procedural Areas - Information given) - - - No - Patient declined -  Copy of [place] Chart? No - copy requested - No - copy requested - No - copy requested Yes -  Would patient like information on creating a medical advance directive? - - - - - - -    Tobacco Social History   Tobacco Use  Smoking Status Never Smoker  Smokeless Tobacco Never Used     Counseling given: Not Answered   Clinical Intake:  Pre-visit preparation completed: Yes  Pain : No/denies pain     Nutritional Status: BMI of 19-24  Normal Nutritional Risks: None Diabetes: No  How often do you need to have someone help you when you read instructions, pamphlets, or other written materials from your doctor or pharmacy?: 1 - Never What is the last grade level you completed in school?: 6th grade  Interpreter Needed?: No  Information entered by :: Tiffany Hill,LPN  Past Medical History:  Diagnosis Date  . Allergy   . 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 Cervical pain 05/30/2015  . GERD (gastroesophageal reflux disease)   . Hyperlipidemia   . Hypertension   . 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 Wears dentures    full upper and lower (doesn't wear lower)  . Wears hearing aid    bilateral   Past Surgical History:  Procedure Laterality Date  . CATARACT EXTRACTION W/PHACO Right 06/11/2016   Procedure: CATARACT EXTRACTION PHACO AND INTRAOCULAR LENS PLACEMENT ([place];  Surgeon: CLeandrew Koyanagi MD;  [place]  Service: Ophthalmology;  Laterality: Right;  . CATARACT EXTRACTION W/PHACO Left 07/09/2016   Procedure: CATARACT EXTRACTION PHACO AND INTRAOCULAR LENS PLACEMENT ([facility]);  Surgeon: CLeandrew Koyanagi MD;  [place]  Service: Ophthalmology;  Laterality: Left;  MALYUGIN  . ESOPHAGOGASTRODUODENOSCOPY (EGD) WITH PROPOFOL N/A 06/20/2015   Procedure: ESOPHAGOGASTRODUODENOSCOPY (EGD) WITH PROPOFOL with dialation;  Surgeon: DLucilla Lame MD;  [place]  Service: Endoscopy;  Laterality: N/A;  . FLEXIBLE BRONCHOSCOPY N/A 06/22/2015   Procedure: FLEXIBLE BRONCHOSCOPY;  Surgeon: SAllyne Gee MD;  Location: [facility];  Service: Pulmonary;  Laterality: N/A;  . GASTRIC OUTLET OBSTRUCTION RELEASE  2013  . 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  . UPPER GI ENDOSCOPY     Family History  Problem Relation Age of Onset  . Diabetes Mother   . Heart disease Father   . Diabetes Sister   . Diabetes Brother   . Stroke  Brother   . Mental illness Brother   . Hypertension Paternal Grandfather    Social History   Socioeconomic History  . Marital status: Widowed    Spouse name: Not on file  . Number of children: Not on file  . Years of education: Not on file  . Highest education level: 6th grade  Occupational History  . Not on file  Social Needs  . Financial resource strain: Not hard at all  . Food insecurity:    Worry: Never true    Inability: Never true  .  Transportation needs:    Medical: No    Non-medical: No  Tobacco Use  . Smoking status: Never Smoker  . Smokeless tobacco: Never Used  Substance and Sexual Activity  . Alcohol use: No  . Drug use: No  . Sexual activity: Not on file  Lifestyle  . Physical activity:    Days per week: 0 days    Minutes per session: 0 min  . Stress: Not at all  Relationships  . Social connections:    Talks on phone: More than three times a week    Gets together: Twice a week    Attends religious service: More than 4 times per year    Active member of club or organization: No    Attends meetings of clubs or organizations: Never    Relationship status: Widowed  Other Topics Concern  . Not on file  Social History Narrative  . Not on file    Outpatient Encounter Medications as of 05/18/2018  Medication Sig  . aspirin EC 81 MG tablet Take 81 mg by mouth daily.  . Blood Glucose Monitoring Suppl (ACCU-CHEK AVIVA PLUS) w/Device KIT Use glucometer to check blood sugar 1 x weekly as advised  . Cholecalciferol (VITAMIN D3) 1000 units CAPS Take 1,000 Units by mouth daily.  . colchicine 0.6 MG tablet Take 1 tablet (0.6 mg total) by mouth daily.  . enalapril (VASOTEC) 20 MG tablet Take 1.5 tablets (30 mg total) by mouth daily.  . fluticasone (FLONASE) 50 MCG/ACT nasal spray Place 2 sprays into both nostrils daily. Use for 4-6 weeks then stop and use seasonally or as needed.  . Glucosamine-Chondroit-Vit C-Mn (GLUCOSAMINE 1500 COMPLEX PO) Take 1,500 mg by mouth daily.  Marland Kitchen glucose blood (ACCU-CHEK AVIVA PLUS) test strip Check blood sugar up to 1x daily or less. Dx. E11.9  . Lancets (ACCU-CHEK MULTICLIX) lancets Check blood sugar twice daily. E11.9  . Multiple Vitamin (MULTIVITAMIN) tablet Take 1 tablet by mouth daily. Reported on 11/21/2015  . omeprazole (PRILOSEC) 20 MG capsule Take 1 capsule (20 mg total) by mouth 2 (two) times daily before a meal.  . tamsulosin (FLOMAX) 0.4 MG CAPS capsule Take 1 capsule (0.4 mg  total) by mouth daily after breakfast.  . Calcium Carb-Cholecalciferol (CALCIUM-VITAMIN D) 500-400 MG-UNIT TABS TAKE ONE TABLET BY MOUTH TWICE DAILY WITH MEALS  . Oyster Shell Calcium 500 MG TABS Take 1 tablet by mouth 2 (two) times daily with a meal.   No facility-administered encounter medications on file as of 05/18/2018.     Activities of Daily Living In your present state of health, do you have any difficulty performing the following activities: 05/18/2018 04/02/2018  Hearing? Y Y  Comment bilateral hearing aids  -  Vision? N N  Difficulty concentrating or making decisions? N N  Walking or climbing stairs? N N  Dressing or bathing? N N  Doing errands, shopping? N N  Preparing Food and eating ?  N -  Using the Toilet? N -  In the past six months, have you accidently leaked urine? N -  Do you have problems with loss of bowel control? N -  Managing your Medications? N -  Managing your Finances? N -  Housekeeping or managing your Housekeeping? N -  Some recent data might be hidden    Patient Care Team: [name], DO as PCP - General (Family Medicine) Edrick Kins, MD as Rounding Team (Internal Medicine)   Assessment:   This is a routine wellness examination for Mukund.  Exercise Activities and Dietary recommendations Current Exercise Habits: Home exercise routine, Type of exercise: walking, Time (Minutes): 20, Frequency (Times/Week): 6, Weekly Exercise (Minutes/Week): 120, Intensity: Mild, Exercise limited by: None identified  Goals    . DIET - INCREASE WATER INTAKE     Recommend drinking at leas 6-8 glasses of water a day        Fall Risk Fall Risk  05/18/2018 04/02/2018 10/02/2017 03/31/2017 07/02/2016  Falls in the past year? No No No No No  Comment - - - - -  Number falls in past yr: - - - - -  Injury with Fall? - - - - -  Risk for fall due to : - - - - -  Follow up - - - - -   Is the patient's home free of loose throw rugs in walkways, pet beds,  electrical cords, etc?   no      Grab bars in the bathroom? yes      Handrails on the stairs?   Yes lives in apartment       Adequate lighting?   yes  Timed Get Up and Go Performed: Completed in 8 seconds with no use of assistive devices, steady gait. No intervention needed at this time.   Depression Screen PHQ 2/9 Scores 05/18/2018 04/02/2018 10/02/2017 03/31/2017  PHQ - 2 Score 0 0 0 0  Exception Documentation - - - -    Cognitive Function     6CIT Screen 05/18/2018 03/31/2017  What Year? 0 points 0 points  What month? 0 points 0 points  What time? 0 points 0 points  Count back from 20 0 points 0 points  Months in reverse 0 points 0 points  Repeat phrase 2 points 0 points  Total Score 2 0    Immunization History  Administered Date(s) Administered  . Influenza Inj Mdck Quad With Preservative 08/26/2017  . Influenza, High Dose Seasonal PF 07/24/2015, 09/09/2016  . Influenza-Unspecified 08/26/2017  . PPD Test 08/03/2015  . Pneumococcal Conjugate-13 07/28/2014    Qualifies for Shingles Vaccine?  Yes, discussed shingrix vaccine   Screening Tests Health Maintenance  Topic Date Due  . INFLUENZA VACCINE  05/27/2018  . TETANUS/TDAP  10/27/2022  . PNA vac Low Risk Adult  Completed   Cancer Screenings: Lung: Low Dose CT Chest recommended if Age 56-80 years, 30 pack-year currently smoking OR have quit w/in 15years. Patient does not qualify. Colorectal: not indicated   Additional Screenings:  Hepatitis C Screening:not indicated       Plan:    I have personally reviewed and addressed the Medicare Annual Wellness questionnaire and have noted the following in the patient's chart:  A. Medical and social history B. Use of alcohol, tobacco or illicit drugs  C. Current medications and supplements D. Functional ability and status E.  Nutritional status F.  Physical activity G. Advance directives H. List of other physicians I.  Hospitalizations,  surgeries, and ER visits in previous  12 months J.  [place] such as hearing and vision if needed, cognitive and depression L. Referrals and appointments   In addition, I have reviewed and discussed with patient certain preventive protocols, quality metrics, and best practice recommendations. A written personalized care plan for preventive services as well as general preventive health recommendations were provided to patient.   Signed,  Tyler Aas, LPN Nurse Health Advisor   Nurse Notes:none

## 2018-05-21 ENCOUNTER — Ambulatory Visit
Admission: RE | Admit: 2018-05-21 | Discharge: 2018-05-21 | Disposition: A | Discharge status: Home / Self Care | Payer: Medicare Other | Source: Ambulatory Visit | Attending: Family Medicine | Admitting: Family Medicine

## 2018-05-21 DIAGNOSIS — R110 Nausea: Secondary | ICD-10-CM | POA: Diagnosis present

## 2018-05-21 DIAGNOSIS — K8020 Calculus of gallbladder without cholecystitis without obstruction: Secondary | ICD-10-CM | POA: Insufficient documentation

## 2018-05-21 DIAGNOSIS — R1011 Right upper quadrant pain: Secondary | ICD-10-CM

## 2018-05-21 DIAGNOSIS — K760 Fatty (change of) liver, not elsewhere classified: Secondary | ICD-10-CM | POA: Insufficient documentation

## 2018-06-30 ENCOUNTER — Telehealth: Payer: Self-pay | Admitting: Family Medicine

## 2018-06-30 NOTE — Telephone Encounter (Signed)
Pt needs referral to [facility] for gall bladder surgery.

## 2018-07-01 ENCOUNTER — Other Ambulatory Visit: Payer: Self-pay | Admitting: Family Medicine

## 2018-07-01 DIAGNOSIS — K8020 Calculus of gallbladder without cholecystitis without obstruction: Secondary | ICD-10-CM

## 2018-07-01 NOTE — Progress Notes (Signed)
referral

## 2018-07-01 NOTE — Telephone Encounter (Signed)
Referral placed.

## 2018-07-06 ENCOUNTER — Encounter: Payer: Self-pay | Admitting: Surgery

## 2018-07-06 ENCOUNTER — Ambulatory Visit (INDEPENDENT_AMBULATORY_CARE_PROVIDER_SITE_OTHER): Payer: Medicare Other | Admitting: Surgery

## 2018-07-06 VITALS — BP 153/81 | HR 62 | Temp 97.7°F | Resp 18 | Ht 67.0 in | Wt 153.6 lb

## 2018-07-06 DIAGNOSIS — K8020 Calculus of gallbladder without cholecystitis without obstruction: Secondary | ICD-10-CM

## 2018-07-06 NOTE — Patient Instructions (Addendum)
Please see your follow up appointment listed below.    Low-Fat Diet for Pancreatitis or Gallbladder Conditions A low-fat diet can be helpful if you have pancreatitis or a gallbladder condition. With these conditions, your pancreas and gallbladder have trouble digesting fats. A healthy eating plan with less fat will help rest your pancreas and gallbladder and reduce your symptoms. What do I need to know about this diet?  Eat a low-fat diet. ? Reduce your fat intake to less than 20-30% of your total daily calories. This is less than 50-60 g of fat per day. ? Remember that you need some fat in your diet. Ask your dietician what your daily goal should be. ? Choose nonfat and low-fat healthy foods. Look for the words "nonfat," "low fat," or "fat free." ? As a guide, look on the label and choose foods with less than 3 g of fat per serving. Eat only one serving.  Avoid alcohol.  Do not smoke. If you need help quitting, talk with your health care provider.  Eat small frequent meals instead of three large heavy meals. What foods can I eat? Grains Include healthy grains and starches such as potatoes, wheat bread, fiber-rich cereal, and brown rice. Choose whole grain options whenever possible. In adults, whole grains should account for 45-65% of your daily calories. Fruits and Vegetables Eat plenty of fruits and vegetables. Fresh fruits and vegetables add fiber to your diet. Meats and Other Protein Sources Eat lean meat such as chicken and pork. Trim any fat off of meat before cooking it. Eggs, fish, and beans are other sources of protein. In adults, these foods should account for 10-35% of your daily calories. Dairy Choose low-fat milk and dairy options. Dairy includes fat and protein, as well as calcium. Fats and Oils Limit high-fat foods such as fried foods, sweets, baked goods, sugary drinks. Other Creamy sauces and condiments, such as mayonnaise, can add extra fat. Think about whether or not  you need to use them, or use smaller amounts or low fat options. What foods are not recommended?  High fat foods, such as: ? [employer]. ? Ice cream. ? [place] toast. ? Sweet rolls. ? Pizza. ? Cheese bread. ? Foods covered with batter, butter, creamy sauces, or cheese. ? Fried foods. ? Sugary drinks and desserts.  Foods that cause gas or bloating This information is not intended to replace advice given to you by your health care provider. Make sure you discuss any questions you have with your health care provider. Document Released: 10/18/2013 Document Revised: 03/20/2016 Document Reviewed: 09/26/2013 Elsevier Interactive Patient Education  2017 [employer].

## 2018-07-06 NOTE — Progress Notes (Signed)
07/06/2018  Reason for Visit:  Cholelithiasis  Referring Provider:  Alexander Karamalegos, DO  History of Present Illness: Zachary Brewer is a 77 y.o. male who presents for evaluation of cholelithiasis.  The patient saw his PCP on 7/23 because a week before that he had had an episode of right upper quadrant abdominal pain with cold sweats as well as nausea.  His pain lasted about 3 hours.  He has had a couple more episodes since then which have been less severe with only some pain but no cold sweats.  After seeing his PCP he had an ultrasound done 7/26 which showed cholelithiasis with stones measuring up to 8 mm without any evidence of cholecystitis.  Referral was made to general surgery.  The patient and his daughter are present they report that the patient has been losing weight recently because the patient has not been eating as much as he is worried about the pain.  He currently denies having any pain.  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 Ely.  He has had early issues with dysphagia or trouble swallowing and required a upper endoscopy with dilatation of the gastrojejunostomy anastomosis by Dr. Wohl in 2016.  This was complicated with aspiration pneumonia.  From the GI standpoint he feels better and feels that the food stays down more easily.  He did have a chest CT in 2016 which showed a quite dilated stomach and duodenum.  Past Medical History: Past Medical History:  Diagnosis Date  . Allergy   . Arthritis 05/30/2015   Gout - Left knee and ankle  . Cervical pain 05/30/2015  . GERD (gastroesophageal reflux disease)   . Hyperlipidemia   . Hypertension   . 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 Wears dentures    full upper and lower (doesn't wear lower)  . Wears hearing aid    bilateral     Past Surgical History: Past Surgical History:  Procedure Laterality Date  . CATARACT  EXTRACTION W/PHACO Right 06/11/2016   Procedure: CATARACT EXTRACTION PHACO AND INTRAOCULAR LENS PLACEMENT (IOC);  Surgeon: Chadwick Brasington, MD;  Location: [facility];  Service: Ophthalmology;  Laterality: Right;  . CATARACT EXTRACTION W/PHACO Left 07/09/2016   Procedure: CATARACT EXTRACTION PHACO AND INTRAOCULAR LENS PLACEMENT (IOC);  Surgeon: Chadwick Brasington, MD;  Location: [facility];  Service: Ophthalmology;  Laterality: Left;  MALYUGIN  . ESOPHAGOGASTRODUODENOSCOPY (EGD) WITH PROPOFOL N/A 06/20/2015   Procedure: ESOPHAGOGASTRODUODENOSCOPY (EGD) WITH PROPOFOL with dialation;  Surgeon: Darren Wohl, MD;  Location: [facility];  Service: Endoscopy;  Laterality: N/A;  . FLEXIBLE BRONCHOSCOPY N/A 06/22/2015   Procedure: FLEXIBLE BRONCHOSCOPY;  Surgeon: Saadat A Khan, MD;  Location: [facility];  Service: Pulmonary;  Laterality: N/A;  . GASTRIC OUTLET OBSTRUCTION RELEASE  2013  . 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  . UPPER GI ENDOSCOPY      Home Medications: Prior to Admission medications   Medication Sig Start Date End Date Taking? Authorizing Provider  aspirin EC 81 MG tablet Take 81 mg by mouth daily.   Yes [provider]  Blood Glucose Monitoring Suppl (ACCU-CHEK AVIVA PLUS) w/Device KIT Use glucometer to check blood sugar 1 x weekly as advised 04/19/18  Yes Karamalegos, Alexander J, DO  Calcium Carb-Cholecalciferol (CALCIUM-VITAMIN D) 500-400 MG-UNIT TABS TAKE ONE TABLET BY MOUTH TWICE DAILY WITH MEALS 08/24/17  Yes [provider]  Cholecalciferol (VITAMIN D3) 1000 units CAPS   Take 1,000 Units by mouth daily.   Yes [provider]  colchicine 0.6 MG tablet Take 1 tablet (0.6 mg total) by mouth daily. 04/02/18  Yes Karamalegos, Alexander J, DO  enalapril (VASOTEC) 20 MG tablet Take 1.5 tablets (30 mg total) by mouth daily. 04/02/18  Yes Karamalegos, Devonne Doughty, DO  Glucosamine-Chondroit-Vit C-Mn (GLUCOSAMINE 1500 COMPLEX PO) Take 1,500 mg by  mouth daily.   Yes [provider]  glucose blood (ACCU-CHEK AVIVA PLUS) test strip Check blood sugar up to 1x daily or less. Dx. E11.9 10/02/17  Yes Karamalegos, Devonne Doughty, DO  Lancets (ACCU-CHEK MULTICLIX) lancets Check blood sugar twice daily. E11.9 04/30/16  Yes Arlis Porta., MD  Multiple Vitamin (MULTIVITAMIN) tablet Take 1 tablet by mouth daily. Reported on 11/21/2015   Yes [provider]  omeprazole (PRILOSEC) 20 MG capsule Take 1 capsule (20 mg total) by mouth 2 (two) times daily before a meal. 04/02/18  Yes Karamalegos, Devonne Doughty, DO  tamsulosin (FLOMAX) 0.4 MG CAPS capsule Take 1 capsule (0.4 mg total) by mouth daily after breakfast. 04/02/18  Yes Karamalegos, Devonne Doughty, DO    Allergies: No Known Allergies  Social History:  reports that he has never smoked. He has never used smokeless tobacco. He reports that he does not drink alcohol or use drugs.   Family History: Family History  Problem Relation Age of Onset  . Diabetes Mother   . Heart disease Father   . Diabetes Sister   . Diabetes Brother   . Stroke Brother   . Mental illness Brother   . Hypertension Paternal Grandfather     Review of Systems: Review of Systems  Constitutional: Positive for weight loss. Negative for chills and fever.  HENT: Negative for hearing loss.   Eyes: Negative for blurred vision.  Respiratory: Negative for shortness of breath.   Cardiovascular: Negative for chest pain.  Gastrointestinal: Negative for abdominal pain, nausea and vomiting.  Genitourinary: Negative for dysuria.  Musculoskeletal: Negative for myalgias.  Skin: Negative for rash.  Neurological: Negative for dizziness.  Psychiatric/Behavioral: Negative for depression.    Physical Exam BP (!) 153/81   Pulse 62   Temp 97.7 F (36.5 C) (Temporal)   Resp 18   Ht 5' 7" (1.702 m)   Wt 153 lb 9.6 oz (69.7 kg)   SpO2 96%   BMI 24.06 kg/m  CONSTITUTIONAL: No acute distress HEENT:  Normocephalic,  atraumatic, extraocular motion intact. NECK: Trachea is midline, and there is no jugular venous distension.  RESPIRATORY:  Lungs are clear, and breath sounds are equal bilaterally. Normal respiratory effort without pathologic use of accessory muscles. CARDIOVASCULAR: Heart is regular without murmurs, gallops, or rubs. GI: The abdomen is soft, nondistended, currently nontender to palpation.  Negative Murphy's sign.  Patient has a well-healed upper midline scar consistent with his exploratory laparotomy.   MUSCULOSKELETAL:  Normal muscle strength and tone in all four extremities.  No peripheral edema or cyanosis. SKIN: Skin turgor is normal. There are no pathologic skin lesions.  NEUROLOGIC:  Motor and sensation is grossly normal.  Cranial nerves are grossly intact. PSYCH:  Alert and oriented to person, place and time. Affect is normal.  Laboratory Analysis: None  Imaging: Ultrasound from 7/26 FINDINGS: Gallbladder: Multiple gallstones measuring up to 8 mm. Negative for sonographic Murphy sign. Gallbladder wall non thickened  Common bile duct: Diameter: 2.2 mm  Liver: Diffusely increased echogenicity of liver parenchyma without focal liver lesion. Portal vein is patent on color Doppler imaging  with normal direction of blood flow towards the liver.  IMPRESSION: Cholelithiasis without biliary dilatation.  Fatty infiltration of the liver.  Assessment and Plan: This is a 77 y.o. male who presents with symptomatic cholelithiasis.  I have independently reviewed the patient's imaging study and reviewed his medical records.  The patient does have cholelithiasis with no evidence of cholecystitis on his ultrasound.  Discussed with the patient that given his extensive surgery in 2015 with his comorbidities including his aspiration pneumonia with anesthesia in 2016 and his recent weight loss, that he would not be the ideal candidate to offer surgery right away.  I think at this point given these  issues, it would be better to attempt a conservative management including diet modification with a low-fat diet.  Encourage the patient to continue eating as he does need to gain his weight back to get protein and strength back.  Discussed with the patient that even changing to a low-fat diet does not guarantee that he would not have any more biliary colic episodes but that hopefully these will be minor or less frequent.  However if conservative measures do not help and the patient still having multiple episodes of bad episodes, then we will talk more about having to proceed with surgery knowing that there are more risks.  Discussed with the patient particularly that there could be significant scar tissue because of the surgery as well as his stomach and duodenum being in the way quite significantly looking at his CAT scan from 2016.  This may increase the chances that he gets an open cholecystectomy thyroid laparoscopic.  At this point the patient is in agreement with proceeding with conservative measures and see how he progresses.  He will follow-up in 1 month to assess how things are going and decide from there whether he require surgery or not.  We will give him information on low-fat diet so he knows what he is able to eat and not.  Face-to-face time spent with the patient and care providers was 60 minutes, with more than 50% of the time spent counseling, educating, and coordinating care of the patient.     Melvyn Neth, [facility]

## 2018-08-11 ENCOUNTER — Ambulatory Visit (INDEPENDENT_AMBULATORY_CARE_PROVIDER_SITE_OTHER): Payer: Medicare Other | Admitting: Surgery

## 2018-08-11 ENCOUNTER — Encounter: Payer: Self-pay | Admitting: Surgery

## 2018-08-11 ENCOUNTER — Other Ambulatory Visit: Payer: Self-pay

## 2018-08-11 VITALS — BP 153/94 | HR 56 | Temp 97.9°F | Resp 16 | Ht 67.0 in | Wt 150.0 lb

## 2018-08-11 DIAGNOSIS — K8020 Calculus of gallbladder without cholecystitis without obstruction: Secondary | ICD-10-CM

## 2018-08-11 NOTE — Progress Notes (Signed)
  08/11/2018  History of Present Illness: Zachary Brewer is a 77 y.o. male with a history of cholelithiasis, seen by me on 9/10.  Given his prior surgical history and complications, it was decided to approach this conservatively with a low fat diet.  He presents today for follow up.  He reports that he's avoiding greasy foods and he has not had any RUQ pain episodes since his last visit.  He denies any nausea or vomiting.  He has been eating well and does not feel any food stuck either.  Allergies: No Known Allergies  Review of Systems: Review of Systems  Constitutional: Negative for chills and fever.  Gastrointestinal: Negative for abdominal pain, nausea and vomiting.    Physical Exam BP (!) 153/94   Pulse (!) 56   Temp 97.9 F (36.6 C) (Skin)   Resp 16   Ht 5\' 7"  (1.702 m)   Wt 150 lb (68 kg)   SpO2 95%   BMI 23.49 kg/m  CONSTITUTIONAL: No acute distress HEENT:  Normocephalic, atraumatic, extraocular motion intact. RESPIRATORY:  Lungs are clear, and breath sounds are equal bilaterally. Normal respiratory effort without pathologic use of accessory muscles. CARDIOVASCULAR: Heart is regular without murmurs, gallops, or rubs. GI: The abdomen is soft, nondistended, nontender to palpation. There were no palpable masses.  NEUROLOGIC:  Motor and sensation is grossly normal.  Cranial nerves are grossly intact. PSYCH:  Alert and oriented to person, place and time. Affect is normal.  Labs/Imaging: None recently  Assessment and Plan: This is a 77 y.o. male with cholelithiasis.  The patient has remained asymptomatic with implementation of a low fat diet.  I did discuss with him that a low fat diet does not guarantee that he would not have any further symptoms, but as he remains asymptomatic, we'll continue with this plan of watchful waiting and conservative treatment.  He does understand that if his pain comes back or he has another bad episode of biliary colic, he should come back to  clinic or the hospital for further evaluation.  It may be that he still needs surgery in the future.  He will follow up with [place] on an as needed basis.  Face-to-face time spent with the patient and care providers was 15 minutes, with more than 50% of the time spent counseling, educating, and coordinating care of the patient.     Melvyn Neth, [facility]

## 2018-08-11 NOTE — Patient Instructions (Addendum)
Patient to avoid greasy foods. The patient is aware to call back for any questions or concerns. Return as needed.

## 2018-09-29 ENCOUNTER — Other Ambulatory Visit: Payer: Medicare Other

## 2018-09-29 DIAGNOSIS — Z0000 Encounter for general adult medical examination without abnormal findings: Secondary | ICD-10-CM

## 2018-09-29 DIAGNOSIS — E782 Mixed hyperlipidemia: Secondary | ICD-10-CM

## 2018-09-29 DIAGNOSIS — R739 Hyperglycemia, unspecified: Secondary | ICD-10-CM | POA: Diagnosis not present

## 2018-09-29 DIAGNOSIS — N401 Enlarged prostate with lower urinary tract symptoms: Secondary | ICD-10-CM

## 2018-09-29 DIAGNOSIS — M1189 Other specified crystal arthropathies, multiple sites: Secondary | ICD-10-CM

## 2018-09-29 DIAGNOSIS — N138 Other obstructive and reflux uropathy: Secondary | ICD-10-CM

## 2018-09-29 DIAGNOSIS — I10 Essential (primary) hypertension: Secondary | ICD-10-CM | POA: Diagnosis not present

## 2018-09-30 LAB — CBC WITH DIFFERENTIAL/PLATELET
BASOS PCT: 0.4 %
Basophils Absolute: 22 cells/uL (ref 0–200)
EOS ABS: 83 {cells}/uL (ref 15–500)
Eosinophils Relative: 1.5 %
HCT: 42.5 % (ref 38.5–50.0)
Hemoglobin: 14.7 g/dL (ref 13.2–17.1)
Lymphs Abs: 1106 cells/uL (ref 850–3900)
MCH: 32 pg (ref 27.0–33.0)
MCHC: 34.6 g/dL (ref 32.0–36.0)
MCV: 92.6 fL (ref 80.0–100.0)
MONOS PCT: 6 %
MPV: 10.3 fL (ref 7.5–12.5)
Neutro Abs: 3960 cells/uL (ref 1500–7800)
Neutrophils Relative %: 72 %
PLATELETS: 191 10*3/uL (ref 140–400)
RBC: 4.59 10*6/uL (ref 4.20–5.80)
RDW: 12.7 % (ref 11.0–15.0)
TOTAL LYMPHOCYTE: 20.1 %
WBC mixed population: 330 cells/uL (ref 200–950)
WBC: 5.5 10*3/uL (ref 3.8–10.8)

## 2018-09-30 LAB — COMPLETE METABOLIC PANEL WITH GFR
AG Ratio: 2.1 (calc) (ref 1.0–2.5)
ALKALINE PHOSPHATASE (APISO): 93 U/L (ref 40–115)
ALT: 15 U/L (ref 9–46)
AST: 20 U/L (ref 10–35)
Albumin: 4.1 g/dL (ref 3.6–5.1)
BUN: 16 mg/dL (ref 7–25)
CO2: 33 mmol/L — ABNORMAL HIGH (ref 20–32)
Calcium: 9.3 mg/dL (ref 8.6–10.3)
Chloride: 101 mmol/L (ref 98–110)
Creat: 0.75 mg/dL (ref 0.70–1.18)
GFR, Est African American: 101 mL/min/{1.73_m2} (ref >=60)
GFR, Est Non African American: 87 mL/min/{1.73_m2} (ref >=60)
GLOBULIN: 2 g/dL (ref 1.9–3.7)
Glucose, Bld: 96 mg/dL (ref 65–99)
POTASSIUM: 3.8 mmol/L (ref 3.5–5.3)
SODIUM: 141 mmol/L (ref 135–146)
Total Bilirubin: 1 mg/dL (ref 0.2–1.2)
Total Protein: 6.1 g/dL (ref 6.1–8.1)

## 2018-09-30 LAB — LIPID PANEL
CHOL/HDL RATIO: 3.5 (calc) (ref <5.0)
Cholesterol: 144 mg/dL (ref <200)
HDL: 41 mg/dL (ref >40)
LDL CHOLESTEROL (CALC): 82 mg/dL
NON-HDL CHOLESTEROL (CALC): 103 mg/dL (ref <130)
TRIGLYCERIDES: 113 mg/dL (ref <150)

## 2018-09-30 LAB — HEMOGLOBIN A1C
HEMOGLOBIN A1C: 5.4 %{Hb} (ref <5.7)
Mean Plasma Glucose: 108 (calc)
eAG (mmol/L): 6 (calc)

## 2018-09-30 LAB — PSA, TOTAL WITH REFLEX TO PSA, FREE: PSA, Total: 1.2 ng/mL (ref <=4.0)

## 2018-10-05 ENCOUNTER — Ambulatory Visit: Payer: Medicare Other | Admitting: Family Medicine

## 2018-10-05 ENCOUNTER — Encounter: Payer: Self-pay | Admitting: Family Medicine

## 2018-10-05 ENCOUNTER — Ambulatory Visit (INDEPENDENT_AMBULATORY_CARE_PROVIDER_SITE_OTHER): Payer: Medicare Other | Admitting: Family Medicine

## 2018-10-05 VITALS — BP 127/65 | HR 68 | Temp 99.1°F | Resp 16 | Ht 67.0 in | Wt 149.0 lb

## 2018-10-05 DIAGNOSIS — Z0000 Encounter for general adult medical examination without abnormal findings: Secondary | ICD-10-CM

## 2018-10-05 DIAGNOSIS — M159 Polyosteoarthritis, unspecified: Secondary | ICD-10-CM

## 2018-10-05 DIAGNOSIS — I10 Essential (primary) hypertension: Secondary | ICD-10-CM

## 2018-10-05 DIAGNOSIS — E782 Mixed hyperlipidemia: Secondary | ICD-10-CM | POA: Diagnosis not present

## 2018-10-05 DIAGNOSIS — M150 Primary generalized (osteo)arthritis: Secondary | ICD-10-CM | POA: Diagnosis not present

## 2018-10-05 DIAGNOSIS — N401 Enlarged prostate with lower urinary tract symptoms: Secondary | ICD-10-CM | POA: Diagnosis not present

## 2018-10-05 DIAGNOSIS — E559 Vitamin D deficiency, unspecified: Secondary | ICD-10-CM

## 2018-10-05 DIAGNOSIS — N138 Other obstructive and reflux uropathy: Secondary | ICD-10-CM

## 2018-10-05 DIAGNOSIS — R739 Hyperglycemia, unspecified: Secondary | ICD-10-CM

## 2018-10-05 MED ORDER — GLUCOSE BLOOD VI STRP: ORAL_STRIP | 12 refills | Status: DC

## 2018-10-05 NOTE — Progress Notes (Signed)
Subjective:    Patient ID: Zachary Brewer, male    DOB: May 22, 1941, 77 y.o.   MRN: [id number]  Zachary Brewer is a 77 y.o. male presenting on 10/05/2018 for Annual Exam   HPI   Here for Annual Physical and Lab Review.  CHRONIC HTN: Reportsno new concerns. Doing well. Has detailed home BP log daily with appropriate range 110-120s / 70-80s Current Meds -Enalapril 67m (takes 1.5 of the 257mtabs) Reports good compliance, took meds today. Tolerating well, w/o complaints. - Admits episode of dizziness about 1 x a month or less, may only last temporarily  Carpal Tunnel Syndrome, Right Wrist Prior traumatic injury >1 year ago with accidental fall and landed on R wrist/hand, he used to have similar problem though however >50 years ago used to have symptoms of R hand/wrist numbness if holding arm elevated and driving especially - Today still has same complaint, no changes - not improved but not worsening. Symptoms if lay on side. - Not using splint - Not ready to see specialist  Elevated Blood Sugar: No concerns. Doing well, balanced diet, family history of Diabetes (brother, sister, and mother). He has never been diagnosed with Pre-DM highest prior A1c 5.4 - stable now with last reading 5.4 CBGs: Avg90-98, Low>85, [id number] CBGsx 1 weekly fasting- has detailed log Meds:None (never) Currently on ACEi Lifestyle: - Diet: He drinks milk 1%. He eats the carnation instant breakfast. Not specifically limiting carbs in diet, eats balanced. Now due to no dentures eating pureed diet, see below - Exercise: regular activity some walking Denies hypoglycemia  History of Gastric Outlet Obstruction (S/p Roux-en-Y surgery) / History of weight loss / GERD: - Briefly reviews prior history of s/p gastric surgery in past see prior note for background information - Today doing well, he measures weight every day, keeps log - Tolerating diet, has dentures but problem with denture adhesive,  see below, stays well hydrated - Taking Omeprazole 2058mIDfor GERD  Dentures He has issues with dentures staying in place, he uses fixation adhesive, but it only lasts 1 meal only, he has problem with some hoarse voice at times, thinks he has swallowed of some the adhesive.  BPH Reports chronic problem. Doing well on Flomax 0.4mg69mily.  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 [facility] (Dr BehaAnnalee Gentaontrolled on regular Tylenol dosing and preventative Colchicine daily.   Health Maintenance: UTD Flu Shot 07/2018 UTD PNA Vaccine UTD TDap  Depression screen PHQ [facility] 10/05/2018 05/18/2018 05/18/2018  Decreased Interest 0 0 0  Down, Depressed, Hopeless 0 0 0  PHQ - 2 Score 0 0 0    Past Medical History:  Diagnosis Date  . Allergy   . Arthritis 05/30/2015   Gout - Left knee and ankle  . Cervical pain 05/30/2015  . GERD (gastroesophageal reflux disease)   . Hyperlipidemia   . Hypertension   . 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 Wears dentures    full upper and lower (doesn't wear lower)  . Wears hearing aid    bilateral   Past Surgical History:  Procedure Laterality Date  . CATARACT EXTRACTION W/PHACO Right 06/11/2016   Procedure: CATARACT EXTRACTION PHACO AND INTRAOCULAR LENS PLACEMENT (IOC)[place]: ChadLeandrew Koyanagi;  [facility];  Laterality: Right;  . CATARACT EXTRACTION W/PHACO Left 07/09/2016   Procedure: CATARACT EXTRACTION PHACO AND INTRAOCULAR LENS PLACEMENT (IOC);  Surgeon: ChadNila Nephew  Brasington, MD;  Location: [place];  Service: Ophthalmology;  Laterality: Left;  MALYUGIN  . ESOPHAGOGASTRODUODENOSCOPY (EGD) WITH PROPOFOL N/A 06/20/2015   Procedure: ESOPHAGOGASTRODUODENOSCOPY (EGD) WITH PROPOFOL with dialation;  Surgeon: Lucilla Lame, MD;  Location: [place];  Service: Endoscopy;  Laterality: N/A;  .  FLEXIBLE BRONCHOSCOPY N/A 06/22/2015   Procedure: FLEXIBLE BRONCHOSCOPY;  Surgeon: Allyne Gee, MD;  Location: [facility];  Service: Pulmonary;  Laterality: N/A;  . GASTRIC OUTLET OBSTRUCTION RELEASE  2013  . 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  . UPPER GI ENDOSCOPY     Social History   Socioeconomic History  . Marital status: Widowed    Spouse name: Not on file  . Number of children: Not on file  . Years of education: Not on file  . Highest education level: 6th grade  Occupational History  . Not on file  Social Needs  . Financial resource strain: Not hard at all  . Food insecurity:    Worry: Never true    Inability: Never true  . Transportation needs:    Medical: No    Non-medical: No  Tobacco Use  . Smoking status: Never Smoker  . Smokeless tobacco: Never Used  Substance and Sexual Activity  . Alcohol use: No  . Drug use: No  . Sexual activity: Not on file  Lifestyle  . Physical activity:    Days per week: 0 days    Minutes per session: 0 min  . Stress: Not at all  Relationships  . Social connections:    Talks on phone: More than three times a week    Gets together: Twice a week    Attends religious service: More than 4 times per year    Active member of club or organization: No    Attends meetings of clubs or organizations: Never    Relationship status: Widowed  . Intimate partner violence:    Fear of current or ex partner: No    Emotionally abused: No    Physically abused: No    Forced sexual activity: No  Other Topics Concern  . Not on file  Social History Narrative  . Not on file   Family History  Problem Relation Age of Onset  . Diabetes Mother   . Heart disease Father   . Diabetes Sister   . Diabetes Brother   . Stroke Brother   . Mental illness Brother   . Hypertension Paternal Grandfather    Current Outpatient Medications on File Prior to Visit  Medication Sig  . aspirin EC 81 MG tablet Take 81 mg by mouth daily.  . Blood Glucose Monitoring  Suppl (ACCU-CHEK AVIVA PLUS) w/Device KIT Use glucometer to check blood sugar 1 x weekly as advised  . Calcium Carb-Cholecalciferol (CALCIUM-VITAMIN D) 500-400 MG-UNIT TABS TAKE ONE TABLET BY MOUTH TWICE DAILY WITH MEALS  . Cholecalciferol (VITAMIN D3) 1000 units CAPS Take 1,000 Units by mouth daily.  . colchicine 0.6 MG tablet Take 1 tablet (0.6 mg total) by mouth daily.  . enalapril (VASOTEC) 20 MG tablet Take 1.5 tablets (30 mg total) by mouth daily.  . Glucosamine-Chondroit-Vit C-Mn (GLUCOSAMINE 1500 COMPLEX PO) Take 1,500 mg by mouth daily.  . Lancets (ACCU-CHEK MULTICLIX) lancets Check blood sugar twice daily. E11.9  . Multiple Vitamin (MULTIVITAMIN) tablet Take 1 tablet by mouth daily. Reported on 11/21/2015  . omeprazole (PRILOSEC) 20 MG capsule Take 1 capsule (20 mg total) by mouth 2 (two) times daily before a meal.  . tamsulosin (FLOMAX) 0.4  MG CAPS capsule Take 1 capsule (0.4 mg total) by mouth daily after breakfast.   No current facility-administered medications on file prior to visit.     Review of Systems  Constitutional: Negative for activity change, appetite change, chills, diaphoresis, fatigue and fever.  HENT: Negative for congestion and hearing loss.   Eyes: Negative for visual disturbance.  Respiratory: Negative for apnea, cough, choking, chest tightness, shortness of breath and wheezing.   Cardiovascular: Negative for chest pain, palpitations and leg swelling.  Gastrointestinal: Negative for abdominal pain, anal bleeding, blood in stool, constipation, diarrhea, nausea and vomiting.  Endocrine: Negative for cold intolerance.  Genitourinary: Negative for difficulty urinating, dysuria, frequency and hematuria.  Musculoskeletal: Negative for arthralgias, back pain and neck pain.  Skin: Negative for rash.  Allergic/Immunologic: Negative for environmental allergies.  Neurological: Positive for numbness (Right hand). Negative for dizziness, weakness, light-headedness and  headaches.  Hematological: Negative for adenopathy.  Psychiatric/Behavioral: Negative for behavioral problems, dysphoric mood and sleep disturbance. The patient is not nervous/anxious.    Per HPI unless specifically indicated above     Objective:    BP 127/65   Pulse 68   Temp 99.1 F (37.3 C) (Oral)   Resp 16   Ht '5\' 7"'  (1.702 m)   Wt 149 lb (67.6 kg)   BMI 23.34 kg/m   Wt Readings from Last 3 Encounters:  10/05/18 149 lb (67.6 kg)  08/11/18 150 lb (68 kg)  07/06/18 153 lb 9.6 oz (69.7 kg)    Physical Exam  Constitutional: He is oriented to person, place, and time. He appears well-developed and well-nourished. No distress.  Well-appearing 77 year old elderly male, comfortable, cooperative  HENT:  Head: Normocephalic and atraumatic.  Mouth/Throat: Oropharynx is clear and moist.  Frontal / maxillary sinuses non-tender. Nares patent without purulence or edema. Bilateral TMs clear without erythema, effusion or bulging. Oropharynx clear without erythema, exudates, edema or asymmetry.  No hearing aids in place  Eyes: Pupils are equal, round, and reactive to light. Conjunctivae and EOM are normal. Right eye exhibits no discharge. Left eye exhibits no discharge.  Neck: Normal range of motion. Neck supple. No thyromegaly present.  Cardiovascular: Normal rate, regular rhythm, normal heart sounds and intact distal pulses.  No murmur heard. Pulmonary/Chest: Effort normal and breath sounds normal. No respiratory distress. He has no wheezes. He has no rales.  Abdominal: Soft. Bowel sounds are normal. He exhibits no distension and no mass. There is no tenderness.  Musculoskeletal: Normal range of motion. He exhibits no edema or tenderness.  Upper / Lower Extremities: - Normal muscle tone, strength bilateral upper extremities 5/5, lower extremities 5/5  R Hand / Wrist No deformity. Non tender. No edema. Tinel's positive, reproduced tingling with carpal tunnel  Lymphadenopathy:    He  has no cervical adenopathy.  Neurological: He is alert and oriented to person, place, and time.  Distal sensation intact to light touch all extremities  Skin: Skin is warm and dry. No rash noted. He is not diaphoretic. No erythema.  Psychiatric: He has a normal mood and affect. His behavior is normal.  Well groomed, good eye contact, normal speech and thoughts  Nursing note and vitals reviewed.    Recent Labs    09/29/18 0805  HGBA1C 5.4    Results for orders placed or performed in visit on 09/29/18  Lipid panel  Result Value Ref Range   Cholesterol 144 <200 mg/dL   HDL 41 >40 mg/dL   Triglycerides 113 <150 mg/dL  LDL Cholesterol (Calc) 82 mg/dL (calc)   Total CHOL/HDL Ratio 3.5 <5.0 (calc)   Non-HDL Cholesterol (Calc) 103 <130 mg/dL (calc)  PSA, Total with Reflex to PSA, Free  Result Value Ref Range   PSA, Total 1.2 < OR = 4.0 ng/mL  COMPLETE METABOLIC PANEL WITH GFR  Result Value Ref Range   Glucose, Bld 96 65 - 99 mg/dL   BUN 16 7 - 25 mg/dL   Creat 0.75 0.70 - 1.18 mg/dL   GFR, Est Non African American 87 > OR = 60 mL/min/1.56m   GFR, Est African American 101 > OR = 60 mL/min/1.737m  BUN/Creatinine Ratio NOT APPLICABLE 6 - 22 (calc)   Sodium 141 135 - 146 mmol/L   Potassium 3.8 3.5 - 5.3 mmol/L   Chloride 101 98 - 110 mmol/L   CO2 33 (H) 20 - 32 mmol/L   Calcium 9.3 8.6 - 10.3 mg/dL   Total Protein 6.1 6.1 - 8.1 g/dL   Albumin 4.1 3.6 - 5.1 g/dL   Globulin 2.0 1.9 - 3.7 g/dL (calc)   AG Ratio 2.1 1.0 - 2.5 (calc)   Total Bilirubin 1.0 0.2 - 1.2 mg/dL   Alkaline phosphatase (APISO) 93 40 - 115 U/L   AST 20 10 - 35 U/L   ALT 15 9 - 46 U/L  CBC with Differential/Platelet  Result Value Ref Range   WBC 5.5 3.8 - 10.8 Thousand/uL   RBC 4.59 4.20 - 5.80 Million/uL   Hemoglobin 14.7 13.2 - 17.1 g/dL   HCT 42.5 38.5 - 50.0 %   MCV 92.6 80.0 - 100.0 fL   MCH 32.0 27.0 - 33.0 pg   MCHC 34.6 32.0 - 36.0 g/dL   RDW 12.7 11.0 - 15.0 %   Platelets 191 140 - 400  Thousand/uL   MPV 10.3 7.5 - 12.5 fL   Neutro Abs 3,960 1,500 - 7,800 cells/uL   Lymphs Abs 1,106 850 - 3,900 cells/uL   WBC mixed population 330 200 - 950 cells/uL   Eosinophils Absolute 83 15 - 500 cells/uL   Basophils Absolute 22 0 - 200 cells/uL   Neutrophils Relative % 72 %   Total Lymphocyte 20.1 %   Monocytes Relative 6.0 %   Eosinophils Relative 1.5 %   Basophils Relative 0.4 %  Hemoglobin A1c  Result Value Ref Range   Hgb A1c MFr Bld 5.4 <5.7 % of total Hgb   Mean Plasma Glucose 108 (calc)   eAG (mmol/L) 6.0 (calc)      Assessment & Plan:   Problem List Items Addressed This Visit    Blood glucose elevated    Controlled with lifestyle.  Normal A1c without dx Pre-DM - Concern with fam history  Plan:  1. Not on any therapy currently  2. Encourage improved lifestyle - low carb, low sugar diet, do not need to restrict portion size 3. Follow-up yearly A1c      Relevant Medications   glucose blood (ACCU-CHEK AVIVA PLUS) test strip   BPH with obstruction/lower urinary tract symptoms    Stable Controlled BPH On Flomax daily      Essential (primary) hypertension    Well-controlled HTN - Home BP readings normal, very detailed log  No known complications   Plan:  1.  Continue current BP regimen - Enalapril 3064maily 2. Encourage improved lifestyle - low sodium diet, regular exercise 3. Continue monitor BP outside office, bring readings to next visit, if persistently >140/90 or new symptoms notify office sooner  4. Follow-up q 6 months      Hyperlipidemia    Controlled cholesterol on lifestyle Last lipid panel 09/2018 Calculated ASCVD 10 yr risk score elevated due to age, HTN  Plan: 1. Briefly reviewed ASCVD risk 2. Continue ASA 62m for primary ASCVD risk reduction 3. Encourage improved lifestyle - low carb/cholesterol, reduce portion size, continue improving regular exercise 4. Follow-up yearly lipids      Osteoarthritis of multiple joints    Suspected  primary etiology for multiple joint pain, also with pseudogout. - No longer on opioids for pain management - Continue Tylenol regularly dosing, Colchicine - RICE therapy - Follow-up, no further imaging or intervention      Vitamin D deficiency    Previously improved Vitamin D Taking Vitamin D3 2,000 iu x 2 daily Advised him to reduce dose down to one of the 2,000 daily       Other Visit Diagnoses    Annual physical exam    -  Primary      Updated Health Maintenance information Reviewed recent lab results with patient Encouraged improvement to lifestyle with diet and exercise   #Carpal Tunnel Stable, seems persistent issue Advised can refer to Neurology if interested to consider nerve study and injection or procedure if indicated - will follow-up  Meds ordered this encounter  Medications  . glucose blood (ACCU-CHEK AVIVA PLUS) test strip    Sig: Check blood sugar up to 1x daily or less. Dx. E11.9    Dispense:  100 each    Refill:  12    Follow up plan: Return in about 6 months (around 04/06/2019) for 6 month follow-up HTN.  ANobie Putnam [facility] 10/05/2018, 3:19 PM

## 2018-10-05 NOTE — Assessment & Plan Note (Signed)
Controlled cholesterol on lifestyle Last lipid panel 09/2018 Calculated ASCVD 10 yr risk score elevated due to age, HTN  Plan: 1. Briefly reviewed ASCVD risk 2. Continue ASA 81mg  for primary ASCVD risk reduction 3. Encourage improved lifestyle - low carb/cholesterol, reduce portion size, continue improving regular exercise 4. Follow-up yearly lipids

## 2018-10-05 NOTE — Assessment & Plan Note (Signed)
Stable Controlled BPH On Flomax daily

## 2018-10-05 NOTE — Assessment & Plan Note (Signed)
Well-controlled HTN - Home BP readings normal, very detailed log  No known complications   Plan:  1.  Continue current BP regimen - Enalapril 30mg  daily 2. Encourage improved lifestyle - low sodium diet, regular exercise 3. Continue monitor BP outside office, bring readings to next visit, if persistently >140/90 or new symptoms notify office sooner 4. Follow-up q 6 months

## 2018-10-05 NOTE — Assessment & Plan Note (Signed)
Suspected primary etiology for multiple joint pain, also with pseudogout. - No longer on opioids for pain management - Continue Tylenol regularly dosing, Colchicine - RICE therapy - Follow-up, no further imaging or intervention

## 2018-10-05 NOTE — Patient Instructions (Addendum)
Thank you for coming to the office today.  REDUCE Vitamin D3 2,000 - instead of taking 2 a day - reduce down to 1 pill a day. - Keep taking this until next visit.  Blood sugar is running well.  Recent Labs    09/29/18 0805  HGBA1C 5.4   Keep checking sugar and monitor blood pressure.  Let me know if interested in referral to Neurologist for evaluation of Right hand carpal tunnel - may benefit from nerve test and steroid injection  Please schedule a Follow-up Appointment to: Return in about 6 months (around 04/06/2019) for 6 month follow-up HTN.  If you have any other questions or concerns, please feel free to call the office or send a message through [place]. You may also schedule an earlier appointment if necessary.  Additionally, you may be receiving a survey about your experience at our office within a few days to 1 week by e-mail or mail. We value your feedback.  [name], DO [place]

## 2018-10-05 NOTE — Assessment & Plan Note (Signed)
Controlled with lifestyle.  Normal A1c without dx Pre-DM - Concern with fam history  Plan:  1. Not on any therapy currently  2. Encourage improved lifestyle - low carb, low sugar diet, do not need to restrict portion size 3. Follow-up yearly A1c

## 2018-10-05 NOTE — Assessment & Plan Note (Signed)
Previously improved Vitamin D Taking Vitamin D3 2,000 iu x 2 daily Advised him to reduce dose down to one of the 2,000 daily

## 2019-01-24 ENCOUNTER — Telehealth: Payer: Self-pay | Admitting: Family Medicine

## 2019-01-24 DIAGNOSIS — M1120 Other chondrocalcinosis, unspecified site: Secondary | ICD-10-CM

## 2019-01-24 MED ORDER — COLCRYS 0.6 MG PO TABS: 0.6000 mg | ORAL_TABLET | Freq: Every day | ORAL | 1 refills | Status: DC

## 2019-01-24 NOTE — Telephone Encounter (Signed)
Drug store called said that they need a new  Prescription on Colcrys with no substitute  [address], Insurance would not pay for generic anymore

## 2019-01-24 NOTE — Telephone Encounter (Signed)
Ordered brand Colcrys 0.6mg  daily  [name], DO [facility] 01/24/2019, 4:50 PM

## 2019-03-16 DIAGNOSIS — G8929 Other chronic pain: Secondary | ICD-10-CM | POA: Diagnosis not present

## 2019-03-16 DIAGNOSIS — M25532 Pain in left wrist: Secondary | ICD-10-CM | POA: Diagnosis not present

## 2019-03-16 DIAGNOSIS — Z79899 Other long term (current) drug therapy: Secondary | ICD-10-CM | POA: Diagnosis not present

## 2019-03-16 DIAGNOSIS — M1180 Other specified crystal arthropathies, unspecified site: Secondary | ICD-10-CM | POA: Diagnosis not present

## 2019-04-07 ENCOUNTER — Encounter: Payer: Self-pay | Admitting: Family Medicine

## 2019-04-07 ENCOUNTER — Ambulatory Visit (INDEPENDENT_AMBULATORY_CARE_PROVIDER_SITE_OTHER): Payer: Medicare Other | Admitting: Family Medicine

## 2019-04-07 ENCOUNTER — Other Ambulatory Visit: Payer: Self-pay

## 2019-04-07 ENCOUNTER — Other Ambulatory Visit: Payer: Self-pay | Admitting: Family Medicine

## 2019-04-07 VITALS — BP 131/60 | HR 64 | Temp 98.4°F | Ht 67.0 in | Wt 148.2 lb

## 2019-04-07 DIAGNOSIS — K2100 Gastro-esophageal reflux disease with esophagitis, without bleeding: Secondary | ICD-10-CM

## 2019-04-07 DIAGNOSIS — N401 Enlarged prostate with lower urinary tract symptoms: Secondary | ICD-10-CM

## 2019-04-07 DIAGNOSIS — N138 Other obstructive and reflux uropathy: Secondary | ICD-10-CM | POA: Diagnosis not present

## 2019-04-07 DIAGNOSIS — I10 Essential (primary) hypertension: Secondary | ICD-10-CM | POA: Diagnosis not present

## 2019-04-07 DIAGNOSIS — Z0000 Encounter for general adult medical examination without abnormal findings: Secondary | ICD-10-CM

## 2019-04-07 DIAGNOSIS — M1189 Other specified crystal arthropathies, multiple sites: Secondary | ICD-10-CM | POA: Diagnosis not present

## 2019-04-07 DIAGNOSIS — E782 Mixed hyperlipidemia: Secondary | ICD-10-CM

## 2019-04-07 DIAGNOSIS — R739 Hyperglycemia, unspecified: Secondary | ICD-10-CM

## 2019-04-07 MED ORDER — TAMSULOSIN HCL 0.4 MG PO CAPS: 0.4000 mg | ORAL_CAPSULE | Freq: Every day | ORAL | 3 refills | Status: DC

## 2019-04-07 MED ORDER — OMEPRAZOLE 20 MG PO CPDR: 20.0000 mg | DELAYED_RELEASE_CAPSULE | Freq: Two times a day (BID) | ORAL | 3 refills | Status: DC

## 2019-04-07 MED ORDER — ENALAPRIL MALEATE 20 MG PO TABS: 30.0000 mg | ORAL_TABLET | Freq: Every day | ORAL | 3 refills | Status: DC

## 2019-04-07 NOTE — Assessment & Plan Note (Addendum)
Stable Controlled BPH On Flomax daily refill today

## 2019-04-07 NOTE — Assessment & Plan Note (Signed)
Stable on Omeprazole PPI 20mg  BID Complex history with prior gastric outlet obstruction and bypass surgery

## 2019-04-07 NOTE — Progress Notes (Signed)
Subjective:    Patient ID: Zachary Brewer, male    DOB: 05-Jun-1941, 78 y.o.   MRN: [id number]  Zachary Brewer is a 78 y.o. male presenting on 04/07/2019 for Hypertension   HPI   CHRONIC HTN: Reportsno new concerns. Doing well. Has detailed home BP log daily with appropriate range and rarely up to SBP 140 Current Meds -Enalapril 30mg  (takes 1.5 of the 20mg  tabs) Reports good compliance, took meds today. Tolerating well, w/o complaints. - Admits episode of dizziness about 1 x a month or less, may only last temporarily  GERD Controlled on Omeprazole 20mg  twice daily needs refill.  BPH No new concerns. Chronic problem. Controlled on Flomax 0.4mg  daily, needs refill  Weight Loss History of diet changes after history of gallstones, recommended from gen surgery, low fat diet to avoid operation of cholecystectomy if it is not needed, on that diet he lost 20-30 lbs on this diet, and started to gain weight back after he changed diet back to normal.  Calcium Pyrophosphate Arthropathy Followed by [facility] Dr Annalee Genta, discontinued off Colchicine (Colcrys), last flare 2018. Consider restart in future.  Depression screen [facility] 2/9 10/05/2018 05/18/2018 05/18/2018  Decreased Interest 0 0 0  Down, Depressed, Hopeless 0 0 0  PHQ - 2 Score 0 0 0    Social History   Tobacco Use  . Smoking status: Never Smoker  . Smokeless tobacco: Never Used  Substance Use Topics  . Alcohol use: No  . Drug use: No    Review of Systems Per HPI unless specifically indicated above     Objective:    BP 131/60 (BP Location: Left Arm, Patient Position: Sitting, Cuff Size: Normal)   Pulse 64   Temp 98.4 F (36.9 C) (Oral)   Ht 5\' 7"  (1.702 m)   Wt 148 lb 3.2 oz (67.2 kg)   BMI 23.21 kg/m   Wt Readings from Last 3 Encounters:  04/07/19 148 lb 3.2 oz (67.2 kg)  10/05/18 149 lb (67.6 kg)  08/11/18 150 lb (68 kg)    Physical Exam Vitals signs and nursing note reviewed.  Constitutional:     General: He is not in acute distress.    Appearance: He is well-developed. He is not diaphoretic.     Comments: Well-appearing, comfortable, cooperative  HENT:     Head: Normocephalic and atraumatic.  Eyes:     General:        Right eye: No discharge.        Left eye: No discharge.     Conjunctiva/sclera: Conjunctivae normal.  Neck:     Musculoskeletal: Normal range of motion and neck supple.     Thyroid: No thyromegaly.  Cardiovascular:     Rate and Rhythm: Normal rate and regular rhythm.     Heart sounds: Normal heart sounds. No murmur.  Pulmonary:     Effort: Pulmonary effort is normal. No respiratory distress.     Breath sounds: Normal breath sounds. No wheezing or rales.  Musculoskeletal: Normal range of motion.     Right lower leg: No edema.     Left lower leg: No edema.  Lymphadenopathy:     Cervical: No cervical adenopathy.  Skin:    General: Skin is warm and dry.     Findings: No erythema or rash.  Neurological:     Mental Status: He is alert and oriented to person, place, and time.  Psychiatric:        Behavior: Behavior normal.  Comments: Well groomed, good eye contact, normal speech and thoughts       Results for orders placed or performed in visit on 09/29/18  Lipid panel  Result Value Ref Range   Cholesterol 144 <200 mg/dL   HDL 41 >40 mg/dL   Triglycerides 113 <150 mg/dL   LDL Cholesterol (Calc) 82 mg/dL (calc)   Total CHOL/HDL Ratio 3.5 <5.0 (calc)   Non-HDL Cholesterol (Calc) 103 <130 mg/dL (calc)  PSA, Total with Reflex to PSA, Free  Result Value Ref Range   PSA, Total 1.2 < OR = 4.0 ng/mL  COMPLETE METABOLIC PANEL WITH GFR  Result Value Ref Range   Glucose, Bld 96 65 - 99 mg/dL   BUN 16 7 - 25 mg/dL   Creat 0.75 0.70 - 1.18 mg/dL   GFR, Est Non African American 87 > OR = 60 mL/min/1.47m2   GFR, Est African American 101 > OR = 60 mL/min/1.10m2   BUN/Creatinine Ratio NOT APPLICABLE 6 - 22 (calc)   Sodium 141 135 - 146 mmol/L   Potassium  3.8 3.5 - 5.3 mmol/L   Chloride 101 98 - 110 mmol/L   CO2 33 (H) 20 - 32 mmol/L   Calcium 9.3 8.6 - 10.3 mg/dL   Total Protein 6.1 6.1 - 8.1 g/dL   Albumin 4.1 3.6 - 5.1 g/dL   Globulin 2.0 1.9 - 3.7 g/dL (calc)   AG Ratio 2.1 1.0 - 2.5 (calc)   Total Bilirubin 1.0 0.2 - 1.2 mg/dL   Alkaline phosphatase (APISO) 93 40 - 115 U/L   AST 20 10 - 35 U/L   ALT 15 9 - 46 U/L  CBC with Differential/Platelet  Result Value Ref Range   WBC 5.5 3.8 - 10.8 Thousand/uL   RBC 4.59 4.20 - 5.80 Million/uL   Hemoglobin 14.7 13.2 - 17.1 g/dL   HCT 42.5 38.5 - 50.0 %   MCV 92.6 80.0 - 100.0 fL   MCH 32.0 27.0 - 33.0 pg   MCHC 34.6 32.0 - 36.0 g/dL   RDW 12.7 11.0 - 15.0 %   Platelets 191 140 - 400 Thousand/uL   MPV 10.3 7.5 - 12.5 fL   Neutro Abs 3,960 1,500 - 7,800 cells/uL   Lymphs Abs 1,106 850 - 3,900 cells/uL   WBC mixed population 330 200 - 950 cells/uL   Eosinophils Absolute 83 15 - 500 cells/uL   Basophils Absolute 22 0 - 200 cells/uL   Neutrophils Relative % 72 %   Total Lymphocyte 20.1 %   Monocytes Relative 6.0 %   Eosinophils Relative 1.5 %   Basophils Relative 0.4 %  Hemoglobin A1c  Result Value Ref Range   Hgb A1c MFr Bld 5.4 <5.7 % of total Hgb   Mean Plasma Glucose 108 (calc)   eAG (mmol/L) 6.0 (calc)      Assessment & Plan:   Problem List Items Addressed This Visit    BPH with obstruction/lower urinary tract symptoms    Stable Controlled BPH On Flomax daily refill today      Relevant Medications   tamsulosin (FLOMAX) 0.4 MG CAPS capsule   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    Chronic joint problem with aspiration and crystal confirmation for pseudogout, primarily knees and wrist Followed by Rheumatology [facility] Dr Jenny Reichmann)  Now as of 02/2019 OFF Colchicine 0.6mg  daily prevention, may restart in future per Rheum - Follow-up PRN flare      Esophagitis, reflux    Stable on Omeprazole PPI  20mg  BID 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  Continue current BP regimen - Enalapril 30mg  daily refilled 2. Encourage improved lifestyle - low sodium diet, regular exercise 3. Continue monitor BP outside office, bring readings to next visit, if persistently >140/90 or new symptoms notify office sooner 4. Follow-up q 6 months      Relevant Medications   enalapril (VASOTEC) 20 MG tablet      #Weight loss - RESOLVED - Previously lost due to low fat diet, now back to regular diet Asymptomatic from gallstones currently  Meds ordered this encounter  Medications  . enalapril (VASOTEC) 20 MG tablet    Sig: Take 1.5 tablets (30 mg total) by mouth daily.    Dispense:  135 tablet    Refill:  3    Keep refills on file, until patient ready  . tamsulosin (FLOMAX) 0.4 MG CAPS capsule    Sig: Take 1 capsule (0.4 mg total) by mouth daily after breakfast.    Dispense:  90 capsule    Refill:  3    Keep refills on file if not ready for fill  . omeprazole (PRILOSEC) 20 MG capsule    Sig: Take 1 capsule (20 mg total) by mouth 2 (two) times daily before a meal.    Dispense:  180 capsule    Refill:  3    Keep refills on file if not ready for fill     Follow up plan: Return in about 6 months (around 10/07/2019) for Annual Physical.  Future labs ordered 10/03/19  [name], [facility] 04/07/2019, 8:15 AM

## 2019-04-07 NOTE — Patient Instructions (Addendum)
Thank you for coming to the office today.  Meds refilled  Keep up the good work.  DUE for FASTING BLOOD WORK (no food or drink after midnight before the lab appointment, only water or coffee without cream/sugar on the morning of)  SCHEDULE "Lab Only" visit in the morning at the clinic for lab draw in 6 MONTHS   - Make sure Lab Only appointment is at about 1 week before your next appointment, so that results will be available  For Lab Results, once available within 2-3 days of blood draw, you can can log in to MyChart online to view your results and a brief explanation. Also, we can discuss results at next follow-up visit.   Please schedule a Follow-up Appointment to: Return in about 6 months (around 10/07/2019) for Annual Physical.  If you have any other questions or concerns, please feel free to call the office or send a message through [facility]. You may also schedule an earlier appointment if necessary.  Additionally, you may be receiving a survey about your experience at our office within a few days to 1 week by e-mail or mail. We value your feedback.  [name], DO [facility]

## 2019-04-07 NOTE — Assessment & Plan Note (Signed)
Chronic joint problem with aspiration and crystal confirmation for pseudogout, primarily knees and wrist Followed by Rheumatology [facility] Dr Jenny Reichmann)  Now as of 02/2019 OFF Colchicine 0.6mg  daily prevention, may restart in future per Rheum - Follow-up PRN flare

## 2019-04-07 NOTE — Assessment & Plan Note (Signed)
Well-controlled HTN - Home BP readings normal, detailed log  No known complications   Plan:  1.  Continue current BP regimen - Enalapril 30mg  daily refilled 2. Encourage improved lifestyle - low sodium diet, regular exercise 3. Continue monitor BP outside office, bring readings to next visit, if persistently >140/90 or new symptoms notify office sooner 4. Follow-up q 6 months

## 2019-05-24 ENCOUNTER — Telehealth: Payer: Self-pay | Admitting: Family Medicine

## 2019-05-24 NOTE — Telephone Encounter (Signed)
I left a message with patient's daughter asking her to call and schedule AWV with Tiffany for the patient. Last AWV 05/18/18 so patient is due now and can be scheduled anytime. VDM (DD)

## 2019-06-24 ENCOUNTER — Telehealth: Payer: Self-pay | Admitting: Family Medicine

## 2019-06-24 NOTE — Telephone Encounter (Signed)
I spoke with the patient's daughter to schedule her dad's AWV with Tiffany.  She asked me to call him, but the mobile number listed is the wrong number.  She said she will call back with the correct number. VDM (DD)

## 2019-07-12 ENCOUNTER — Other Ambulatory Visit: Payer: Self-pay

## 2019-07-12 ENCOUNTER — Ambulatory Visit (INDEPENDENT_AMBULATORY_CARE_PROVIDER_SITE_OTHER): Payer: Medicare Other

## 2019-07-12 VITALS — BP 110/85 | HR 56 | Temp 98.0°F | Resp 15 | Ht 67.0 in | Wt 147.0 lb

## 2019-07-12 DIAGNOSIS — Z0000 Encounter for general adult medical examination without abnormal findings: Secondary | ICD-10-CM

## 2019-07-12 DIAGNOSIS — Z23 Encounter for immunization: Secondary | ICD-10-CM | POA: Diagnosis not present

## 2019-07-12 NOTE — Progress Notes (Signed)
Subjective:   Zachary Brewer is a 78 y.o. male who presents for Medicare Annual/Subsequent preventive examination.  Review of Systems:   Cardiac Risk Factors include: male gender;advanced age (>21mn, >>22women);dyslipidemia;hypertension     Objective:    Vitals: BP 110/85 (BP Location: Left Arm, Patient Position: Sitting, Cuff Size: Normal)    Pulse (!) 56    Temp 98 F (36.7 C) (Oral)    Resp 15    Ht '5\' 7"'  (1.702 m)    Wt 147 lb (66.7 kg)    BMI 23.02 kg/m   Body mass index is 23.02 kg/m.  Advanced Directives 07/12/2019 05/18/2018 03/31/2017 07/09/2016 07/02/2016 06/11/2016 05/02/2016  Does Patient Have a Medical Advance Directive? Yes Yes Yes Yes Yes Yes Yes  Type of Advance Directive Living will;Healthcare Power of [place] - Healthcare Power of [place] will [place] will  Does patient want to make changes to medical advance directive? - - Yes (MAU/Ambulatory/Procedural Areas - Information given) - - - No - Patient declined  Copy of [place] Chart? No - copy requested No - copy requested - No - copy requested - No - copy requested Yes  Would patient like information on creating a medical advance directive? - - - - - - -    Tobacco Social History   Tobacco Use  Smoking Status Never Smoker  Smokeless Tobacco Never Used     Counseling given: Not Answered   Clinical Intake:  Pre-visit preparation completed: Yes  Pain : No/denies pain     Nutritional Status: BMI of 19-24  Normal Nutritional Risks: None Diabetes: No  How often do you need to have someone help you when you read instructions, pamphlets, or other written materials from your doctor or pharmacy?: 1 - Never  Interpreter Needed?: No  Information entered by :: Sila Sarsfield,LPN  Past Medical History:  Diagnosis Date   Allergy    Arthritis  05/30/2015   Gout - Left knee and ankle   Cervical pain 05/30/2015   GERD (gastroesophageal reflux disease)    Hyperlipidemia    Hypertension    Lipoma of neck 05/30/2015   Wears dentures    full upper and lower (doesn't wear lower)   Wears hearing aid    bilateral   Past Surgical History:  Procedure Laterality Date   CATARACT EXTRACTION W/PHACO Right 06/11/2016   Procedure: CATARACT EXTRACTION PHACO AND INTRAOCULAR LENS PLACEMENT ([place];  Surgeon: CLeandrew Koyanagi MD;  [place]  Service: Ophthalmology;  Laterality: Right;   CATARACT EXTRACTION W/PHACO Left 07/09/2016   Procedure: CATARACT EXTRACTION PHACO AND INTRAOCULAR LENS PLACEMENT (IOC);  Surgeon: CLeandrew Koyanagi MD;  [facility]  Service: [facility]hthalmology;  Laterality: Left;  MALYUGIN   ESOPHAGOGASTRODUODENOSCOPY (EGD) WITH PROPOFOL N/A 06/20/2015   Procedure: ESOPHAGOGASTRODUODENOSCOPY (EGD) WITH PROPOFOL with dialation;  Surgeon: DLucilla Lame MD;  [place]  Service: Endoscopy;  Laterality: N/A;   FLEXIBLE BRONCHOSCOPY N/A 06/22/2015   Procedure: FLEXIBLE BRONCHOSCOPY;  Surgeon: SAllyne Gee MD;  Location: [facility];  Service: Pulmonary;  Laterality: N/A;   GASTRIC OUTLET OBSTRUCTION RELEASE  2013   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   UPPER GI ENDOSCOPY     Family History  Problem Relation Age of Onset   Diabetes Mother    Alzheimer's disease Mother    Heart disease Father    Diabetes Sister  Diabetes Brother    Stroke Brother    Mental illness Brother    Hypertension Paternal Grandfather    Social History   Socioeconomic History   Marital status: Widowed    Spouse name: Not on file   Number of children: Not on file   Years of education: Not on file   Highest education level: 6th grade  Occupational History   Occupation: retired  Scientist, product/process development strain: Not hard at [employer] insecurity    Worry: Never  true    Inability: Never true   Transportation needs    Medical: No    Non-medical: No  Tobacco Use   Smoking status: Never Smoker   Smokeless tobacco: Never Used  Substance and Sexual Activity   Alcohol use: No   Drug use: No   Sexual activity: Not on file  Lifestyle   Physical activity    Days per week: 0 days    Minutes per session: 0 min   Stress: Not at all  Relationships   Social connections    Talks on phone: More than three times a week    Gets together: Twice a week    Attends religious service: More than 4 times per year    Active member of club or organization: No    Attends meetings of clubs or organizations: Never    Relationship status: Widowed  Other Topics Concern   Not on file  Social History Narrative   Not on file    Outpatient Encounter Medications as of 07/12/2019  Medication Sig   aspirin EC 81 MG tablet Take 81 mg by mouth daily.   Blood Glucose Monitoring Suppl (ACCU-CHEK AVIVA PLUS) w/Device KIT Use glucometer to check blood sugar 1 x weekly as advised   Cholecalciferol (VITAMIN D3) 1000 units CAPS Take 1,000 Units by mouth daily.   COLCRYS 0.6 MG tablet Take 1 tablet (0.6 mg total) by mouth daily.   enalapril (VASOTEC) 20 MG tablet Take 1.5 tablets (30 mg total) by mouth daily.   Glucosamine-Chondroit-Vit C-Mn (GLUCOSAMINE 1500 COMPLEX PO) Take 1,500 mg by mouth daily.   glucose blood (ACCU-CHEK AVIVA PLUS) test strip Check blood sugar up to 1x daily or less. Dx. E11.9   Lancets (ACCU-CHEK MULTICLIX) lancets Check blood sugar twice daily. E11.9   Multiple Vitamin (MULTIVITAMIN) tablet Take 1 tablet by mouth daily. Reported on 11/21/2015   omeprazole (PRILOSEC) 20 MG capsule Take 1 capsule (20 mg total) by mouth 2 (two) times daily before a meal.   tamsulosin (FLOMAX) 0.4 MG CAPS capsule Take 1 capsule (0.4 mg total) by mouth daily after breakfast.   Calcium Carb-Cholecalciferol (CALCIUM-VITAMIN D) 500-400 MG-UNIT TABS TAKE ONE  TABLET BY MOUTH TWICE DAILY WITH MEALS   No facility-administered encounter medications on file as of 07/12/2019.     Activities of Daily Living In your present state of health, do you have any difficulty performing the following activities: 07/12/2019  Hearing? Y  Comment hearing aids  Vision? Y  Comment eyeglasses, hard time with fine print. goes to [facility]  Difficulty concentrating or making decisions? N  Walking or climbing stairs? N  Comment walks up them slowly.  Dressing or bathing? N  Doing errands, shopping? N  Preparing Food and eating ? N  Using the Toilet? N  In the past six months, have you accidently leaked urine? N  Do you have problems with loss of bowel control? N  Managing your Medications? N  Managing your Finances? N  Housekeeping or managing your Housekeeping? N  Some recent data might be hidden    Patient Care Team: [name], DO as PCP - General (Family Medicine) Edrick Kins, MD as Rounding Team (Internal Medicine)   Assessment:   This is a routine wellness examination for Harkirat.  Exercise Activities and Dietary recommendations Current Exercise Habits: Home exercise routine, Type of exercise: walking, Time (Minutes): 20, Frequency (Times/Week): 5, Weekly Exercise (Minutes/Week): 100, Intensity: Mild, Exercise limited by: None identified  Goals     DIET - INCREASE WATER INTAKE     Recommend drinking at leas 6-8 glasses of water a day      Increase water intake     Recommend drinking at least 4-5 glasses of water a day       Fall Risk: Fall Risk  07/12/2019 10/05/2018 05/18/2018 05/18/2018 04/02/2018  Falls in the past year? 0 0 No No No  Comment - - - - -  Number falls in past yr: - - - - -  Injury with Fall? - - - - -  Risk for fall due to : - - - - -  Follow up - Falls evaluation completed - - -    FALL RISK PREVENTION PERTAINING TO THE HOME:  Any stairs in or around the home? Yes  If so, are there any  without handrails? No   Home free of loose throw rugs in walkways, pet beds, electrical cords, etc? Yes  Adequate lighting in your home to reduce risk of falls? Yes   ASSISTIVE DEVICES UTILIZED TO PREVENT FALLS:  Life alert? No  Use of a cane, walker or w/c? No  Grab bars in the bathroom? Yes  Shower chair or bench in shower? No  Elevated toilet seat or a handicapped toilet? No   TIMED UP AND GO:  Was the test performed? Yes .  Length of time to ambulate 10 feet: 11 sec.   GAIT:  Appearance of gait: Gait steady and fast without the use of an assistive device.  Education: Fall risk prevention has been discussed.  Intervention(s) required? No  DME/home health order needed?  No   Depression Screen PHQ 2/9 Scores 07/12/2019 10/05/2018 05/18/2018 05/18/2018  PHQ - 2 Score 1 0 0 0  Exception Documentation - - - -    Cognitive Function     6CIT Screen 07/12/2019 05/18/2018 03/31/2017  What Year? 0 points 0 points 0 points  What month? 0 points 0 points 0 points  What time? 0 points 0 points 0 points  Count back from 20 0 points 0 points 0 points  Months in reverse 0 points 0 points 0 points  Repeat phrase 4 points 2 points 0 points  Total Score 4 2 0    Immunization History  Administered Date(s) Administered   Fluad Quad(high Dose 65+) 07/12/2019   Influenza Inj Mdck Quad With Preservative 08/26/2017   Influenza, High Dose Seasonal PF 07/24/2015, 09/09/2016   Influenza-Unspecified 08/26/2017, 08/06/2018   PPD Test 08/03/2015   Pneumococcal Conjugate-13 07/28/2014    Qualifies for Shingles Vaccine? Yes  Zostavax completed n/a. Due for Shingrix. Education has been provided regarding the importance of this vaccine. Pt has been advised to call insurance company to determine out of pocket expense. Advised may also receive vaccine at local pharmacy or Health Dept. Verbalized acceptance and understanding.  Tdap: up to date   Flu Vaccine: Due for Flu vaccine. Does the  patient want to receive  this vaccine today?  Yes .   Pneumococcal Vaccine: up to date   Screening Tests Health Maintenance  Topic Date Due   TETANUS/TDAP  10/27/2022   INFLUENZA VACCINE  Completed   PNA vac Low Risk Adult  Completed   Cancer Screenings:  Colorectal Screening: no longer required  Lung Cancer Screening: (Low Dose CT Chest recommended if Age 58-80 years, 30 pack-year currently smoking OR have quit w/in 15years.) does not qualify.    Additional Screening:  Hepatitis C Screening: does not qualify  Vision Screening: Recommended annual ophthalmology exams for early detection of glaucoma and other disorders of the eye. Is the patient up to date with their annual eye exam?  Yes  Who is the provider or what is the name of the office in which the pt attends annual eye exams? [facility]    Dental Screening: Recommended annual dental exams for proper oral hygiene  Community Resource Referral:  CRR required this visit?  No        Plan:  I have personally reviewed and addressed the Medicare Annual Wellness questionnaire and have noted the following in the patients chart:  A. Medical and social history B. Use of alcohol, tobacco or illicit drugs  C. Current medications and supplements D. Functional ability and status E.  Nutritional status F.  Physical activity G. Advance directives H. List of other physicians I.  Hospitalizations, surgeries, and ER visits in previous 12 months J.  [place] such as hearing and vision if needed, cognitive and depression L. Referrals and appointments   In addition, I have reviewed and discussed with patient certain preventive protocols, quality metrics, and best practice recommendations. A written personalized care plan for preventive services as well as general preventive health recommendations were provided to patient.   Signed,   Bevelyn Ngo, LPN  0/27/7412 Nurse Health Advisor   Nurse Notes: none

## 2019-07-12 NOTE — Patient Instructions (Addendum)
Zachary Brewer , Thank you for taking time to come for your Medicare Wellness Visit. I appreciate your ongoing commitment to your health goals. Please review the following plan we discussed and let me know if I can assist you in the future.   Screening recommendations/referrals: Colonoscopy: no longer required Recommended yearly ophthalmology/optometry visit for glaucoma screening and checkup Recommended yearly dental visit for hygiene and checkup  Vaccinations: Influenza vaccine: done today  Pneumococcal vaccine: up to date Tdap vaccine: up to date Shingles vaccine: shingrix eligible  Advanced directives: Advance directive discussed with you today. I have provided a copy for you to complete at home and have notarized. Once this is complete please bring a copy in to our office so we can scan it into your chart.  Conditions/risks identified: discussed fall prevention   Next appointment: Follow up in one year for your annual wellness visit   Preventive Care 65 Years and Older, Male Preventive care refers to lifestyle choices and visits with your health care provider that can promote health and wellness. What does preventive care include?  A yearly physical exam. This is also called an annual well check.  Dental exams once or twice a year.  Routine eye exams. Ask your health care provider how often you should have your eyes checked.  Personal lifestyle choices, including:  Daily care of your teeth and gums.  Regular physical activity.  Eating a healthy diet.  Avoiding tobacco and drug use.  Limiting alcohol use.  Practicing safe sex.  Taking low doses of aspirin every day.  Taking vitamin and mineral supplements as recommended by your health care provider. What happens during an annual well check? The services and screenings done by your health care provider during your annual well check will depend on your age, overall health, lifestyle risk factors, and family history of  disease. Counseling  Your health care provider may ask you questions about your:  Alcohol use.  Tobacco use.  Drug use.  Emotional well-being.  Home and relationship well-being.  Sexual activity.  Eating habits.  History of falls.  Memory and ability to understand (cognition).  Work and work Statistician. Screening  You may have the following tests or measurements:  Height, weight, and BMI.  Blood pressure.  Lipid and cholesterol levels. These may be checked every 5 years, or more frequently if you are over 62 years old.  Skin check.  Lung cancer screening. You may have this screening every year starting at age 65 if you have a 30-pack-year history of smoking and currently smoke or have quit within the past 15 years.  Fecal occult blood test (FOBT) of the stool. You may have this test every year starting at age 30.  Flexible sigmoidoscopy or colonoscopy. You may have a sigmoidoscopy every 5 years or a colonoscopy every 10 years starting at age 99.  Prostate cancer screening. Recommendations will vary depending on your family history and other risks.  Hepatitis C blood test.  Hepatitis B blood test.  Sexually transmitted disease (STD) testing.  Diabetes screening. This is done by checking your blood sugar (glucose) after you have not eaten for a while (fasting). You may have this done every 1-3 years.  Abdominal aortic aneurysm (AAA) screening. You may need this if you are a current or former smoker.  Osteoporosis. You may be screened starting at age 67 if you are at high risk. Talk with your health care provider about your test results, treatment options, and if necessary, the need  for more tests. Vaccines  Your health care provider may recommend certain vaccines, such as:  Influenza vaccine. This is recommended every year.  Tetanus, diphtheria, and acellular pertussis (Tdap, Td) vaccine. You may need a Td booster every 10 years.  Zoster vaccine. You may  need this after age 56.  Pneumococcal 13-valent conjugate (PCV13) vaccine. One dose is recommended after age 61.  Pneumococcal polysaccharide (PPSV23) vaccine. One dose is recommended after age 67. Talk to your health care provider about which screenings and vaccines you need and how often you need them. This information is not intended to replace advice given to you by your health care provider. Make sure you discuss any questions you have with your health care provider. Document Released: 11/09/2015 Document Revised: 07/02/2016 Document Reviewed: 08/14/2015 Elsevier Interactive Patient Education  2017 Media Prevention in the Home Falls can cause injuries. They can happen to people of all ages. There are many things you can do to make your home safe and to help prevent falls. What can I do on the outside of my home?  Regularly fix the edges of walkways and driveways and fix any cracks.  Remove anything that might make you trip as you walk through a door, such as a raised step or threshold.  Trim any bushes or trees on the path to your home.  Use bright outdoor lighting.  Clear any walking paths of anything that might make someone trip, such as rocks or tools.  Regularly check to see if handrails are loose or broken. Make sure that both sides of any steps have handrails.  Any raised decks and porches should have guardrails on the edges.  Have any leaves, snow, or ice cleared regularly.  Use sand or salt on walking paths during winter.  Clean up any spills in your garage right away. This includes oil or grease spills. What can I do in the bathroom?  Use night lights.  Install grab bars by the toilet and in the tub and shower. Do not use towel bars as grab bars.  Use non-skid mats or decals in the tub or shower.  If you need to sit down in the shower, use a plastic, non-slip stool.  Keep the floor dry. Clean up any water that spills on the floor as soon as it  happens.  Remove soap buildup in the tub or shower regularly.  Attach bath mats securely with double-sided non-slip rug tape.  Do not have throw rugs and other things on the floor that can make you trip. What can I do in the bedroom?  Use night lights.  Make sure that you have a light by your bed that is easy to reach.  Do not use any sheets or blankets that are too big for your bed. They should not hang down onto the floor.  Have a firm chair that has side arms. You can use this for support while you get dressed.  Do not have throw rugs and other things on the floor that can make you trip. What can I do in the kitchen?  Clean up any spills right away.  Avoid walking on wet floors.  Keep items that you use a lot in easy-to-reach places.  If you need to reach something above you, use a strong step stool that has a grab bar.  Keep electrical cords out of the way.  Do not use floor polish or wax that makes floors slippery. If you must use wax, use  non-skid floor wax.  Do not have throw rugs and other things on the floor that can make you trip. What can I do with my stairs?  Do not leave any items on the stairs.  Make sure that there are handrails on both sides of the stairs and use them. Fix handrails that are broken or loose. Make sure that handrails are as long as the stairways.  Check any carpeting to make sure that it is firmly attached to the stairs. Fix any carpet that is loose or worn.  Avoid having throw rugs at the top or bottom of the stairs. If you do have throw rugs, attach them to the floor with carpet tape.  Make sure that you have a light switch at the top of the stairs and the bottom of the stairs. If you do not have them, ask someone to add them for you. What else can I do to help prevent falls?  Wear shoes that:  Do not have high heels.  Have rubber bottoms.  Are comfortable and fit you well.  Are closed at the toe. Do not wear sandals.  If you  use a stepladder:  Make sure that it is fully opened. Do not climb a closed stepladder.  Make sure that both sides of the stepladder are locked into place.  Ask someone to hold it for you, if possible.  Clearly mark and make sure that you can see:  Any grab bars or handrails.  First and last steps.  Where the edge of each step is.  Use tools that help you move around (mobility aids) if they are needed. These include:  Canes.  Walkers.  Scooters.  Crutches.  Turn on the lights when you go into a dark area. Replace any light bulbs as soon as they burn out.  Set up your furniture so you have a clear path. Avoid moving your furniture around.  If any of your floors are uneven, fix them.  If there are any pets around you, be aware of where they are.  Review your medicines with your doctor. Some medicines can make you feel dizzy. This can increase your chance of falling. Ask your doctor what other things that you can do to help prevent falls. This information is not intended to replace advice given to you by your health care provider. Make sure you discuss any questions you have with your health care provider. Document Released: 08/09/2009 Document Revised: 03/20/2016 Document Reviewed: 11/17/2014 Elsevier Interactive Patient Education  2017 [employer].

## 2019-07-22 ENCOUNTER — Other Ambulatory Visit: Payer: Self-pay | Admitting: Family Medicine

## 2019-07-22 DIAGNOSIS — M1120 Other chondrocalcinosis, unspecified site: Secondary | ICD-10-CM

## 2019-07-22 MED ORDER — COLCRYS 0.6 MG PO TABS: 0.6000 mg | ORAL_TABLET | Freq: Every day | ORAL | 1 refills | Status: DC

## 2019-09-13 ENCOUNTER — Other Ambulatory Visit: Payer: Self-pay

## 2019-09-13 DIAGNOSIS — N138 Other obstructive and reflux uropathy: Secondary | ICD-10-CM

## 2019-09-13 DIAGNOSIS — M1120 Other chondrocalcinosis, unspecified site: Secondary | ICD-10-CM

## 2019-09-13 DIAGNOSIS — K2100 Gastro-esophageal reflux disease with esophagitis, without bleeding: Secondary | ICD-10-CM

## 2019-09-13 DIAGNOSIS — I10 Essential (primary) hypertension: Secondary | ICD-10-CM

## 2019-09-14 MED ORDER — OMEPRAZOLE 20 MG PO CPDR: 20.0000 mg | DELAYED_RELEASE_CAPSULE | Freq: Two times a day (BID) | ORAL | 3 refills | Status: DC

## 2019-09-14 MED ORDER — TAMSULOSIN HCL 0.4 MG PO CAPS: 0.4000 mg | ORAL_CAPSULE | Freq: Every day | ORAL | 3 refills | Status: DC

## 2019-09-14 MED ORDER — COLCRYS 0.6 MG PO TABS: 0.6000 mg | ORAL_TABLET | Freq: Every day | ORAL | 1 refills | Status: DC

## 2019-09-14 MED ORDER — ENALAPRIL MALEATE 20 MG PO TABS: 30.0000 mg | ORAL_TABLET | Freq: Every day | ORAL | 3 refills | Status: DC

## 2019-10-03 ENCOUNTER — Other Ambulatory Visit: Payer: Medicare Other

## 2019-10-03 ENCOUNTER — Other Ambulatory Visit: Payer: Self-pay

## 2019-10-03 DIAGNOSIS — E782 Mixed hyperlipidemia: Secondary | ICD-10-CM

## 2019-10-03 DIAGNOSIS — R739 Hyperglycemia, unspecified: Secondary | ICD-10-CM

## 2019-10-03 DIAGNOSIS — I10 Essential (primary) hypertension: Secondary | ICD-10-CM | POA: Diagnosis not present

## 2019-10-03 DIAGNOSIS — N138 Other obstructive and reflux uropathy: Secondary | ICD-10-CM

## 2019-10-03 DIAGNOSIS — N401 Enlarged prostate with lower urinary tract symptoms: Secondary | ICD-10-CM

## 2019-10-03 DIAGNOSIS — Z0000 Encounter for general adult medical examination without abnormal findings: Secondary | ICD-10-CM

## 2019-10-04 LAB — CBC WITH DIFFERENTIAL/PLATELET
Absolute Monocytes: 288 cells/uL (ref 200–950)
Basophils Absolute: 19 cells/uL (ref 0–200)
Basophils Relative: 0.4 %
Eosinophils Absolute: 38 cells/uL (ref 15–500)
Eosinophils Relative: 0.8 %
HCT: 44 % (ref 38.5–50.0)
Hemoglobin: 14.9 g/dL (ref 13.2–17.1)
Lymphs Abs: 1075 cells/uL (ref 850–3900)
MCH: 32.4 pg (ref 27.0–33.0)
MCHC: 33.9 g/dL (ref 32.0–36.0)
MCV: 95.7 fL (ref 80.0–100.0)
MPV: 10.6 fL (ref 7.5–12.5)
Monocytes Relative: 6 %
Neutro Abs: 3379 cells/uL (ref 1500–7800)
Neutrophils Relative %: 70.4 %
Platelets: 160 10*3/uL (ref 140–400)
RBC: 4.6 10*6/uL (ref 4.20–5.80)
RDW: 12.4 % (ref 11.0–15.0)
Total Lymphocyte: 22.4 %
WBC: 4.8 10*3/uL (ref 3.8–10.8)

## 2019-10-04 LAB — COMPLETE METABOLIC PANEL WITH GFR
AG Ratio: 2 (calc) (ref 1.0–2.5)
ALT: 16 U/L (ref 9–46)
AST: 22 U/L (ref 10–35)
Albumin: 4 g/dL (ref 3.6–5.1)
Alkaline phosphatase (APISO): 93 U/L (ref 35–144)
BUN: 20 mg/dL (ref 7–25)
CO2: 32 mmol/L (ref 20–32)
Calcium: 8.6 mg/dL (ref 8.6–10.3)
Chloride: 102 mmol/L (ref 98–110)
Creat: 0.8 mg/dL (ref 0.70–1.11)
GFR, Est African American: 98 mL/min/{1.73_m2} (ref >=60)
GFR, Est Non African American: 84 mL/min/{1.73_m2} (ref >=60)
Globulin: 2 g/dL (calc) (ref 1.9–3.7)
Glucose, Bld: 99 mg/dL (ref 65–99)
Potassium: 3.9 mmol/L (ref 3.5–5.3)
Sodium: 141 mmol/L (ref 135–146)
Total Bilirubin: 1 mg/dL (ref 0.2–1.2)
Total Protein: 6 g/dL — ABNORMAL LOW (ref 6.1–8.1)

## 2019-10-04 LAB — HEMOGLOBIN A1C
Hgb A1c MFr Bld: 5.3 % of total Hgb (ref <5.7)
Mean Plasma Glucose: 105 (calc)
eAG (mmol/L): 5.8 (calc)

## 2019-10-04 LAB — LIPID PANEL
Cholesterol: 120 mg/dL (ref <200)
HDL: 34 mg/dL — ABNORMAL LOW (ref >=40)
LDL Cholesterol (Calc): 67 mg/dL (calc)
Non-HDL Cholesterol (Calc): 86 mg/dL (calc) (ref <130)
Total CHOL/HDL Ratio: 3.5 (calc) (ref <5.0)
Triglycerides: 109 mg/dL (ref <150)

## 2019-10-04 LAB — PSA: PSA: 1 ng/mL (ref <=4.0)

## 2019-10-06 ENCOUNTER — Ambulatory Visit (INDEPENDENT_AMBULATORY_CARE_PROVIDER_SITE_OTHER): Payer: Medicare Other | Admitting: Family Medicine

## 2019-10-06 ENCOUNTER — Encounter: Payer: Self-pay | Admitting: Family Medicine

## 2019-10-06 ENCOUNTER — Other Ambulatory Visit: Payer: Self-pay

## 2019-10-06 VITALS — BP 130/70 | HR 66 | Temp 98.4°F | Resp 16 | Ht 67.0 in | Wt 149.0 lb

## 2019-10-06 DIAGNOSIS — M1120 Other chondrocalcinosis, unspecified site: Secondary | ICD-10-CM | POA: Diagnosis not present

## 2019-10-06 DIAGNOSIS — Z0000 Encounter for general adult medical examination without abnormal findings: Secondary | ICD-10-CM | POA: Diagnosis not present

## 2019-10-06 DIAGNOSIS — R739 Hyperglycemia, unspecified: Secondary | ICD-10-CM | POA: Diagnosis not present

## 2019-10-06 DIAGNOSIS — I10 Essential (primary) hypertension: Secondary | ICD-10-CM

## 2019-10-06 DIAGNOSIS — N401 Enlarged prostate with lower urinary tract symptoms: Secondary | ICD-10-CM

## 2019-10-06 DIAGNOSIS — N138 Other obstructive and reflux uropathy: Secondary | ICD-10-CM

## 2019-10-06 MED ORDER — ACCU-CHEK FASTCLIX LANCETS MISC: 3 refills | Status: DC

## 2019-10-06 NOTE — Progress Notes (Signed)
Subjective:    Patient ID: Zachary Brewer, male    DOB: [birth year], 78 y.o.   MRN: [id number]  Zachary Brewer is a 78 y.o. male presenting on 10/06/2019 for Annual Exam   HPI   Here for Annual Physical and Lab Review.  CHRONIC HTN: Reportsno new concerns. Doing well. Has detailed home BP log daily with appropriate range110-120s /70-80s Current Meds -Enalapril 64m (takes 1.5 of the 248mtabs) Reports good compliance, took meds today. Tolerating well, w/o complaints. Rare dizziness episode  Elevated Blood Sugar: No concerns. Doing well, balanced diet,family history of Diabetes (brother, sister, and mother). A1c 5.3 on last lab, He still checks sugar weekly for his own reference. CBGs: Avg90, Low>84, High< 120. Checks CBGsx 1 weekly fasting- has detailed log Meds:None (never) Currently on ACEi Lifestyle: - Diet: Healthy diet. No significant change - Exercise: regular activity some walking Denies hypoglycemia  History of Gastric Outlet Obstruction (S/p Roux-en-Y surgery) / History of weight loss / GERD: - Briefly reviews prior history of s/p gastric surgery in pastsee prior note for background information - Today doing well, he measures weight every day, keeps log - Tolerating diet, has dentures but problem with denture adhesive, see below, stays well hydrated - Taking Omeprazole 2080mIDfor GERD  BPH Reports chronic problem. Doing well on Flomax 0.4mg41mily.  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  [facility]s [facility]  Health Maintenance: UTD Flu Shot 06/2019 UTD PNA Vaccine UTD TDap   Dep[facility] screen [facility] 07/12/2019 10/05/2018  Decreased Interest 0 0 0  Down, Depressed, Hopeless 0 1 0  PHQ - 2 Score 0  1 0    Past Medical History:  Diagnosis Date  . Allergy   . Arthritis 05/30/2015   Gout - Left knee and ankle  . Cervical pain 05/30/2015  . GERD (gastroesophageal reflux disease)   . Hyperlipidemia   . Hypertension   . 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 Wears dentures    full upper and lower (doesn't wear lower)  . Wears hearing aid    bilateral   Past Surgical History:  Procedure Laterality Date  . CATARACT EXTRACTION W/PHACO Right 06/11/2016   Procedure: CATARACT EXTRACTION PHACO AND INTRAOCULAR LENS PLACEMENT (IOC);Lunaurgeon: ChadwLeandrew Koyanagi  [facility] Fore[facility]vice: Ophthalmology;  Laterality: Right;  . CATARACT EXTRACTION W/PHACO Left 07/09/2016   Procedure: CATARACT EXTRACTION PHACO AND INTRAOCULAR LENS PLACEMENT (IOC);  Surgeon: ChadwLeandrew Koyanagi  Location: [facility]: Ophthalmology;  Laterality: Left;  MALYUGIN  . ESOPHAGOGASTRODUODENOSCOPY (EGD) WITH PROPOFOL N/A 06/20/2015   Procedure: ESOPHAGOGASTRODUODENOSCOPY (EGD) WITH PROPOFOL with dialation;  Surgeon: DarreLucilla Lame  Location: [facility]: Endoscopy;  Laterality: N/A;  . FLEXIBLE BRONCHOSCOPY N/A 06/22/2015   Procedure: FLEXIBLE BRONCHOSCOPY;  Surgeon: SaadaAllyne Gee  Location: [facility];  Service: Pulmonary;  Laterality: N/A;  . GASTRIC OUTLET OBSTRUCTION RELEASE  2013  . 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  . UPPER GI ENDOSCOPY     Social History   Socioeconomic History  . Marital status: Widowed    Spouse name: Not on file  . Number of children: Not on file  . Years of education: Not on file  . Highest education level: 6th grade  Occupational History  . Occupation:  retired  Tobacco Use  . Smoking status: Never Smoker  . Smokeless tobacco: Never Used  Substance and Sexual Activity  . Alcohol use: No  . Drug use: No  . Sexual activity: Not on file  Other Topics Concern  . Not on file  Social History Narrative  . Not on file   Social  Determinants of Health   Financial Resource Strain: Low Risk   . Difficulty of Paying Living Expenses: Not hard at all  Food Insecurity: No Food Insecurity  . Worried About Charity fundraiser in the Last Year: Never true  . Ran Out of Food in the Last Year: Never true  Transportation Needs: No Transportation Needs  . Lack of Transportation (Medical): No  . Lack of Transportation (Non-Medical): No  Physical Activity: Inactive  . Days of Exercise per Week: 0 days  . Minutes of Exercise per Session: 0 min  Stress: No Stress Concern Present  . Feeling of Stress : Not at all  Social Connections: Somewhat Isolated  . Frequency of Communication with Friends and Family: More than three times a week  . Frequency of Social Gatherings with Friends and Family: Twice a week  . Attends Religious Services: More than 4 times per year  . Active Member of Clubs or Organizations: No  . Attends Archivist Meetings: Never  . Marital Status: Widowed  Intimate Partner Violence: Not At Risk  . Fear of Current or Ex-Partner: No  . Emotionally Abused: No  . Physically Abused: No  . Sexually Abused: No   Family History  Problem Relation Age of Onset  . Diabetes Mother   . Alzheimer's disease Mother   . Heart disease Father   . Diabetes Sister   . Diabetes Brother   . Stroke Brother   . Mental illness Brother   . Hypertension Paternal Grandfather    Current Outpatient Medications on File Prior to Visit  Medication Sig  . aspirin EC 81 MG tablet Take 81 mg by mouth daily.  . Blood Glucose Monitoring Suppl (ACCU-CHEK AVIVA PLUS) w/Device KIT Use glucometer to check blood sugar 1 x weekly as advised  . Calcium Carb-Cholecalciferol (CALCIUM-VITAMIN D) 500-400 MG-UNIT TABS TAKE ONE TABLET BY MOUTH TWICE DAILY WITH MEALS  . Cholecalciferol (VITAMIN D3) 1000 units CAPS Take 1,000 Units by mouth daily.  . enalapril (VASOTEC) 20 MG tablet Take 1.5 tablets (30 mg total) by mouth daily.  .  Glucosamine-Chondroit-Vit C-Mn (GLUCOSAMINE 1500 COMPLEX PO) Take 1,500 mg by mouth daily.  Marland Kitchen glucose blood (ACCU-CHEK AVIVA PLUS) test strip Check blood sugar up to 1x daily or less. Dx. E11.9  . Multiple Vitamin (MULTIVITAMIN) tablet Take 1 tablet by mouth daily. Reported on 11/21/2015  . omeprazole (PRILOSEC) 20 MG capsule Take 1 capsule (20 mg total) by mouth 2 (two) times daily before a meal.  . psyllium (METAMUCIL) 58.6 % powder Take 1 packet by mouth daily.  . tamsulosin (FLOMAX) 0.4 MG CAPS capsule Take 1 capsule (0.4 mg total) by mouth daily after breakfast.   No current facility-administered medications on file prior to visit.    Review of Systems  Constitutional: Negative for activity change, appetite change, chills, diaphoresis, fatigue and fever.  HENT: Negative for congestion and hearing loss.   Eyes: Negative for visual disturbance.  Respiratory: Negative for apnea, cough, chest tightness, shortness of breath and wheezing.   Cardiovascular: Negative for chest pain, palpitations and leg swelling.  Gastrointestinal: Negative for abdominal pain, anal bleeding, blood in  stool, constipation, diarrhea, nausea and vomiting.  Endocrine: Negative for cold intolerance.  Genitourinary: Negative for difficulty urinating, dysuria, frequency and hematuria.  Musculoskeletal: Negative for arthralgias, back pain and neck pain.  Skin: Negative for rash.  Allergic/Immunologic: Negative for environmental allergies.  Neurological: Negative for dizziness, weakness, light-headedness, numbness and headaches.  Hematological: Negative for adenopathy.  Psychiatric/Behavioral: Negative for behavioral problems, dysphoric mood and sleep disturbance. The patient is not nervous/anxious.    Per HPI unless specifically indicated above      Objective:    BP 130/70   Pulse 66   Temp 98.4 F (36.9 C) (Oral)   Resp 16   Ht '5\' 7"'  (1.702 m)   Wt 149 lb (67.6 kg)   BMI 23.34 kg/m   Wt Readings from  Last 3 Encounters:  10/06/19 149 lb (67.6 kg)  07/12/19 147 lb (66.7 kg)  04/07/19 148 lb 3.2 oz (67.2 kg)    Physical Exam Vitals and nursing note reviewed.  Constitutional:      General: He is not in acute distress.    Appearance: He is well-developed. He is not diaphoretic.     Comments: Well-appearing, comfortable, cooperative  HENT:     Head: Normocephalic and atraumatic.  Eyes:     General:        Right eye: No discharge.        Left eye: No discharge.     Conjunctiva/sclera: Conjunctivae normal.     Pupils: Pupils are equal, round, and reactive to light.  Neck:     Thyroid: No thyromegaly.  Cardiovascular:     Rate and Rhythm: Normal rate and regular rhythm.     Heart sounds: Normal heart sounds. No murmur.  Pulmonary:     Effort: Pulmonary effort is normal. No respiratory distress.     Breath sounds: Normal breath sounds. No wheezing or rales.  Abdominal:     General: Bowel sounds are normal. There is no distension.     Palpations: Abdomen is soft. There is no mass.     Tenderness: There is no abdominal tenderness.  Musculoskeletal:        General: No tenderness. Normal range of motion.     Cervical back: Normal range of motion and neck supple.     Comments: Upper / Lower Extremities: - Normal muscle tone, strength bilateral upper extremities 5/5, lower extremities 5/5  Lymphadenopathy:     Cervical: No cervical adenopathy.  Skin:    General: Skin is warm and dry.     Findings: No erythema or rash.  Neurological:     Mental Status: He is alert and oriented to person, place, and time.     Comments: Distal sensation intact to light touch all extremities  Psychiatric:        Behavior: Behavior normal.     Comments: Well groomed, good eye contact, normal speech and thoughts        Results for orders placed or performed in visit on 10/03/19  PSA  Result Value Ref Range   PSA 1.0 < OR = 4.0 ng/mL  Lipid panel  Result Value Ref Range   Cholesterol 120 <200  mg/dL   HDL 34 (L) > OR = 40 mg/dL   Triglycerides 109 <150 mg/dL   LDL Cholesterol (Calc) 67 mg/dL (calc)   Total CHOL/HDL Ratio 3.5 <5.0 (calc)   Non-HDL Cholesterol (Calc) 86 <130 mg/dL (calc)  COMPLETE METABOLIC PANEL WITH GFR  Result Value Ref Range   Glucose, Bld 99  65 - 99 mg/dL   BUN 20 7 - 25 mg/dL   Creat 0.80 0.70 - 1.11 mg/dL   GFR, Est Non African American 84 > OR = 60 mL/min/1.46m   GFR, Est African American 98 > OR = 60 mL/min/1.76m  BUN/Creatinine Ratio NOT APPLICABLE 6 - 22 (calc)   Sodium 141 135 - 146 mmol/L   Potassium 3.9 3.5 - 5.3 mmol/L   Chloride 102 98 - 110 mmol/L   CO2 32 20 - 32 mmol/L   Calcium 8.6 8.6 - 10.3 mg/dL   Total Protein 6.0 (L) 6.1 - 8.1 g/dL   Albumin 4.0 3.6 - 5.1 g/dL   Globulin 2.0 1.9 - 3.7 g/dL (calc)   AG Ratio 2.0 1.0 - 2.5 (calc)   Total Bilirubin 1.0 0.2 - 1.2 mg/dL   Alkaline phosphatase (APISO) 93 35 - 144 U/L   AST 22 10 - 35 U/L   ALT 16 9 - 46 U/L  CBC with Differential/Platelet  Result Value Ref Range   WBC 4.8 3.8 - 10.8 Thousand/uL   RBC 4.60 4.20 - 5.80 Million/uL   Hemoglobin 14.9 13.2 - 17.1 g/dL   HCT 44.0 38.5 - 50.0 %   MCV 95.7 80.0 - 100.0 fL   MCH 32.4 27.0 - 33.0 pg   MCHC 33.9 32.0 - 36.0 g/dL   RDW 12.4 11.0 - 15.0 %   Platelets 160 140 - 400 Thousand/uL   MPV 10.6 7.5 - 12.5 fL   Neutro Abs 3,379 1,500 - 7,800 cells/uL   Lymphs Abs 1,075 850 - 3,900 cells/uL   Absolute Monocytes 288 200 - 950 cells/uL   Eosinophils Absolute 38 15 - 500 cells/uL   Basophils Absolute 19 0 - 200 cells/uL   Neutrophils Relative % 70.4 %   Total Lymphocyte 22.4 %   Monocytes Relative 6.0 %   Eosinophils Relative 0.8 %   Basophils Relative 0.4 %  Hemoglobin A1c  Result Value Ref Range   Hgb A1c MFr Bld 5.3 <5.7 % of total Hgb   Mean Plasma Glucose 105 (calc)   eAG (mmol/L) 5.8 (calc)      Assessment & Plan:   Problem List Items Addressed This Visit    Pseudogout    Controlled Now without flare >2 year  Rheumatology discontinued his Colchicine prophylaxis, we called [facility] to cancel this order.      Essential (primary) hypertension    Well-controlled HTN - Home BP readings normal, detailed log  No known complications    Plan:  1.  Continue current BP regimen - Enalapril 3053maily refilled 2. Encourage improved lifestyle - low sodium diet, regular exercise 3. Continue monitor BP outside office, bring readings to next visit, if persistently >140/90 or new symptoms notify office sooner 4. Follow-up q 6 months      BPH with obstruction/lower urinary tract symptoms    Stable Controlled BPH On Flomax daily       Blood glucose elevated    Controlled with lifestyle.  Normal A1c without dx Pre-DM - Concern with fam history  Plan:  1. Not on any therapy currently  2. Encourage improved lifestyle - low carb, low sugar diet, do not need to restrict portion size 3. Follow-up yearly A1c      Relevant Medications   Accu-Chek FastClix Lancets MISC    Other Visit Diagnoses    Annual physical exam    -  Primary       Updated Health Maintenance information  Reviewed recent lab results with patient Encouraged improvement to lifestyle with diet and exercise Maintain weight  All [facility]s [facility] to confirm receipt and delivery before 10/28/19, completed form for OTC meds for patient.  Meds ordered this encounter  Medications  . Accu-Chek FastClix Lancets MISC    Sig: Use to check blood sugar up to 1 x weekly    Dispense:  102 each    Refill:  3    Please dispense 90 day supply     Follow up plan: Return in about 6 months (around 04/05/2020) for 6 month follow-up HTN.  [name], [facility] 10/06/2019, 9:26 AM

## 2019-10-06 NOTE — Patient Instructions (Addendum)
Thank you for coming to the office today.  We will work with [facility] to organize your rx   DISCONTINUED Colchicine, do not need to take this even if they send it to you.  Continue other meds.   Please schedule a Follow-up Appointment to: Return in about 6 months (around 04/05/2020) for 6 month follow-up HTN.  If you have any other questions or concerns, please feel free to call the office or send a message through [place]. You may also schedule an earlier appointment if necessary.  Additionally, you may be receiving a survey about your experience at our office within a few days to 1 week by e-mail or mail. We value your feedback.  [name], DO [place]

## 2019-10-07 ENCOUNTER — Encounter: Payer: Medicare Other | Admitting: Family Medicine

## 2019-10-07 NOTE — Assessment & Plan Note (Signed)
Well-controlled HTN - Home BP readings normal, detailed log  No known complications    Plan:  1.  Continue current BP regimen - Enalapril 30mg  daily refilled 2. Encourage improved lifestyle - low sodium diet, regular exercise 3. Continue monitor BP outside office, bring readings to next visit, if persistently >140/90 or new symptoms notify office sooner 4. Follow-up q 6 months

## 2019-10-07 NOTE — Assessment & Plan Note (Signed)
Controlled with lifestyle.  Normal A1c without dx Pre-DM - Concern with fam history  Plan:  1. Not on any therapy currently  2. Encourage improved lifestyle - low carb, low sugar diet, do not need to restrict portion size 3. Follow-up yearly A1c

## 2019-10-07 NOTE — Assessment & Plan Note (Signed)
Stable Controlled BPH On Flomax daily

## 2019-10-07 NOTE — Assessment & Plan Note (Signed)
Controlled Now without flare >2 year Rheumatology discontinued his Colchicine prophylaxis, we called [facility] to cancel this order.

## 2019-10-12 ENCOUNTER — Other Ambulatory Visit: Payer: Self-pay | Admitting: Family Medicine

## 2019-10-12 DIAGNOSIS — R739 Hyperglycemia, unspecified: Secondary | ICD-10-CM

## 2019-11-03 DIAGNOSIS — M8949 Other hypertrophic osteoarthropathy, multiple sites: Secondary | ICD-10-CM | POA: Diagnosis not present

## 2019-11-03 DIAGNOSIS — I499 Cardiac arrhythmia, unspecified: Secondary | ICD-10-CM | POA: Diagnosis not present

## 2019-11-03 DIAGNOSIS — M1180 Other specified crystal arthropathies, unspecified site: Secondary | ICD-10-CM | POA: Diagnosis not present

## 2019-11-07 DIAGNOSIS — I499 Cardiac arrhythmia, unspecified: Secondary | ICD-10-CM | POA: Diagnosis not present

## 2019-11-07 DIAGNOSIS — E785 Hyperlipidemia, unspecified: Secondary | ICD-10-CM | POA: Diagnosis not present

## 2019-11-07 DIAGNOSIS — I493 Ventricular premature depolarization: Secondary | ICD-10-CM | POA: Diagnosis not present

## 2019-11-07 DIAGNOSIS — I4510 Unspecified right bundle-branch block: Secondary | ICD-10-CM | POA: Diagnosis not present

## 2019-11-07 DIAGNOSIS — I10 Essential (primary) hypertension: Secondary | ICD-10-CM | POA: Diagnosis not present

## 2019-11-18 DIAGNOSIS — I4510 Unspecified right bundle-branch block: Secondary | ICD-10-CM | POA: Diagnosis not present

## 2019-11-18 DIAGNOSIS — I493 Ventricular premature depolarization: Secondary | ICD-10-CM | POA: Diagnosis not present

## 2019-11-18 DIAGNOSIS — I510 Cardiac septal defect, acquired: Secondary | ICD-10-CM | POA: Diagnosis not present

## 2019-11-22 DIAGNOSIS — I493 Ventricular premature depolarization: Secondary | ICD-10-CM | POA: Diagnosis not present

## 2019-11-23 DIAGNOSIS — I10 Essential (primary) hypertension: Secondary | ICD-10-CM | POA: Diagnosis not present

## 2019-11-23 DIAGNOSIS — I493 Ventricular premature depolarization: Secondary | ICD-10-CM | POA: Diagnosis not present

## 2019-12-16 ENCOUNTER — Telehealth: Payer: Self-pay | Admitting: Family Medicine

## 2019-12-16 NOTE — Chronic Care Management (AMB) (Signed)
  Chronic Care Management   Note  12/16/2019 Name: SHANNAN GARFINKEL MRN: [id number] DOB: 05/14/1941  [name] is a 79 y.o. year old male who is a primary care patient of [name], DO. I reached out to [name] by phone today in response to a referral sent by Mr. [employer] health plan.     Mr. Wagster daughter [name] was given information about Chronic Care Management services today including:  1. CCM service includes personalized support from designated clinical staff supervised by his physician, including individualized plan of care and coordination with other care providers 2. 24/7 contact phone numbers for assistance for urgent and routine care needs. 3. Service will only be billed when office clinical staff spend 20 minutes or more in a month to coordinate care. 4. Only one practitioner may furnish and bill the service in a calendar month. 5. The patient may stop CCM services at any time (effective at the end of the month) by phone call to the office staff. 6. The patient will be responsible for cost sharing (co-pay) of up to 20% of the service fee (after annual deductible is met).  Patient's daughter [name] agreed to services and verbal consent obtained.   Follow up plan: Telephone appointment with care management team member scheduled for:01/16/2020  Noreene Larsson, [place], [place], [place] [address] Direct Dial: [phone] [email or web address] Website: [email or web address]

## 2019-12-28 ENCOUNTER — Other Ambulatory Visit: Payer: Self-pay | Admitting: Family Medicine

## 2019-12-28 DIAGNOSIS — N138 Other obstructive and reflux uropathy: Secondary | ICD-10-CM

## 2019-12-28 DIAGNOSIS — I10 Essential (primary) hypertension: Secondary | ICD-10-CM

## 2019-12-28 DIAGNOSIS — K2100 Gastro-esophageal reflux disease with esophagitis, without bleeding: Secondary | ICD-10-CM

## 2019-12-28 MED ORDER — ENALAPRIL MALEATE 20 MG PO TABS: 30.0000 mg | ORAL_TABLET | Freq: Every day | ORAL | 3 refills | Status: DC

## 2019-12-28 MED ORDER — OMEPRAZOLE 20 MG PO CPDR: 20.0000 mg | DELAYED_RELEASE_CAPSULE | Freq: Two times a day (BID) | ORAL | 3 refills | Status: DC

## 2019-12-28 MED ORDER — TAMSULOSIN HCL 0.4 MG PO CAPS: 0.4000 mg | ORAL_CAPSULE | Freq: Every day | ORAL | 3 refills | Status: DC

## 2019-12-28 NOTE — Telephone Encounter (Signed)
Pt is requesting ALL medication to go back to  [facility] more  Mail orders  90 day  supply tamsulosion 0.4MG  Omeprazole 20MG  Enalapril 20 MG

## 2019-12-29 ENCOUNTER — Telehealth: Payer: Self-pay | Admitting: Family Medicine

## 2019-12-29 NOTE — Telephone Encounter (Signed)
Called and spoke to the pharmacist question was about lancet and blood strips.

## 2019-12-29 NOTE — Telephone Encounter (Signed)
Vaughan Basta with [facility] [employer] order is requesting a call back they need clarification on pt medication  [phone]

## 2020-01-16 ENCOUNTER — Telehealth: Payer: Medicare Other

## 2020-01-16 ENCOUNTER — Ambulatory Visit: Payer: Self-pay | Admitting: General Practice

## 2020-01-16 NOTE — Chronic Care Management (AMB) (Signed)
  Chronic Care Management   Outreach Note  01/16/2020 Name: Zachary Brewer MRN: [id number] DOB: [birth year]  Referred by: [name], DO Reason for referral : Chronic Care Management (Initial outreach: Attempt 1)   An unsuccessful telephone outreach was attempted today. The patient was referred to the case management team for assistance with care management and care coordination. The patients daughter was at work and could not talk today. Ask for a call back at another time. Will reach back out in one to 2 weeks.   Follow Up Plan: The care management team will reach out to the patient again over the next 7 to 14 days.   Noreene Larsson RN, MSN, [place] Mobile: [phone]

## 2020-01-18 ENCOUNTER — Other Ambulatory Visit: Payer: Self-pay | Admitting: Family Medicine

## 2020-01-18 DIAGNOSIS — M1120 Other chondrocalcinosis, unspecified site: Secondary | ICD-10-CM

## 2020-01-20 ENCOUNTER — Encounter: Payer: Self-pay | Admitting: Family Medicine

## 2020-01-20 ENCOUNTER — Other Ambulatory Visit: Payer: Self-pay

## 2020-01-20 ENCOUNTER — Ambulatory Visit (INDEPENDENT_AMBULATORY_CARE_PROVIDER_SITE_OTHER): Payer: Medicare Other | Admitting: Family Medicine

## 2020-01-20 ENCOUNTER — Other Ambulatory Visit: Payer: Self-pay | Admitting: Family Medicine

## 2020-01-20 ENCOUNTER — Ambulatory Visit: Payer: Medicare Other | Admitting: Family Medicine

## 2020-01-20 VITALS — BP 115/70 | HR 64 | Temp 97.8°F | Resp 16 | Ht 67.0 in | Wt 147.0 lb

## 2020-01-20 DIAGNOSIS — N138 Other obstructive and reflux uropathy: Secondary | ICD-10-CM

## 2020-01-20 DIAGNOSIS — R350 Frequency of micturition: Secondary | ICD-10-CM

## 2020-01-20 DIAGNOSIS — N401 Enlarged prostate with lower urinary tract symptoms: Secondary | ICD-10-CM | POA: Diagnosis not present

## 2020-01-20 DIAGNOSIS — R351 Nocturia: Secondary | ICD-10-CM | POA: Diagnosis not present

## 2020-01-20 DIAGNOSIS — R8290 Unspecified abnormal findings in urine: Secondary | ICD-10-CM

## 2020-01-20 LAB — POCT URINALYSIS DIPSTICK
Bilirubin, UA: NEGATIVE
Glucose, UA: NEGATIVE
Ketones, UA: NEGATIVE
Leukocytes, UA: NEGATIVE
Nitrite, UA: NEGATIVE
Protein, UA: NEGATIVE
Spec Grav, UA: 1.01 (ref 1.010–1.025)
Urobilinogen, UA: 0.2 E.U./dL
pH, UA: 5 (ref 5.0–8.0)

## 2020-01-20 MED ORDER — FINASTERIDE 5 MG PO TABS: 5.0000 mg | ORAL_TABLET | Freq: Every day | ORAL | 1 refills | Status: DC

## 2020-01-20 NOTE — Patient Instructions (Addendum)
Thank you for coming to the office today.  Increase Tamsulosin 0.4mg  capsule to TWO pills now, can take TWO at once in AM, - if needed may switch to 1 pill in AM and 1 pill in PM, or TWO in PM if you prefer, caution may make you dizzy or lightheaded  Start new med Finasteride 5mg  daily to shrink prostate may take few weeks to months to shrink prostate and reduce your symptoms.  Call us in 1-2 weeks to see if you are doing well on Tamsulosin double dose, we can re order more so you don't run out if it works.  If cannot pee at all for >12-24 hours, or pain or blood in urine or new concerns reach out to [place] sooner or seek care at hospital/Urgent care.  Please schedule a Follow-up Appointment to: Return in about 3 months (around 04/21/2020) for keep apt scheduled.  If you have any other questions or concerns, please feel free to call the office or send a message through [place]. You may also schedule an earlier appointment if necessary.  Additionally, you may be receiving a survey about your experience at our office within a few days to 1 week by e-mail or mail. We value your feedback.  [name], DO [place]

## 2020-01-20 NOTE — Assessment & Plan Note (Signed)
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 Inadequate control on Tamsulosin 0.4mg  long term med Variety of Severe LUTS and nocturia primarily bothering him - Last PSA 1.0 (123XX123) - Last DRE uncertain - No known personal/family history of prostate CA  Plan: 1. Discussion on BPH symptoms uncontrolled. Will proceed with BPH treatment for now, based on his symptoms Check UA dipstick today, trace RBC Check urine culture to rule out UTI  - Start increase current med Tamsulosin 0.4mg  x 2 = 0.8mg  daily (can take 2 in AM for now if prefer can split to BID dosing or QHS - based on his preference) reviewed side effects and risk, caution dizzy lightheaded -Also Start Finasteride 5mg  daily, to shrink prostate, will reduce PSA in future by nearly 50% expected, may take 2-6 months for full benefit  Offered DRE - he declined today, will perform at next visit as discussed  Follow-up as planned. Discussed that if not noticeable improvement in symptoms - may have to consider consultation with Urologist, but he declines this today.

## 2020-01-20 NOTE — Progress Notes (Signed)
Subjective:    Patient ID: Zachary Brewer, male    DOB: January 14, 1941, 79 y.o.   MRN: [id number]  Zachary Brewer is a 79 y.o. male presenting on 01/20/2020 for Nocturia (as per patient getting up frequent to uriante as compare to day time and feels Urinary retention)   HPI   BPH with LUTS NOCTURIA / FREQUENCY Chronic problem with BPH. On Tamsulosin 0.4mg  daily in AM but unsure how much this is helping anymore, he doesn't recall how his symptoms were off med years ago. He says that this problem has been bothering him for a while (years) but he didn't say anything. Now he is seeking help due to frequent nocturia waking up 6-7 times overnight.  Last PSA 1.0 (09/2019)  He is not interested to see Urologist at this time.  AUA BPH Symptom Score over past 1 month 1. Sensation of not emptying bladder post void - 1 2. Urinate less than 2 hour after finish last void - 5 3. Start/Stop several times during void - 3 4. Difficult to postpone urination - 4 5. Weak urinary stream - 3 6. Push or strain urination - 2 7. Nocturia - 6 times  Total Score: 24 (Severe BPH symptoms)  Denies abdominal pain, dysuria, gross hematuria, fever chills flank pain, back pain, nausea vomiting     Depression screen [facility] 2/9 10/06/2019 07/12/2019 10/05/2018  Decreased Interest 0 0 0  Down, Depressed, Hopeless 0 1 0  PHQ - 2 Score 0 1 0    Social History   Tobacco Use  . Smoking status: Never Smoker  . Smokeless tobacco: Never Used  Substance Use Topics  . Alcohol use: No  . Drug use: No    Review of Systems Per HPI unless specifically indicated above     Objective:    BP 115/70   Pulse 64   Temp 97.8 F (36.6 C) (Temporal)   Resp 16   Ht 5\' 7"  (1.702 m)   Wt 147 lb (66.7 kg)   SpO2 97%   BMI 23.02 kg/m   Wt Readings from Last 3 Encounters:  01/20/20 147 lb (66.7 kg)  10/06/19 149 lb (67.6 kg)  07/12/19 147 lb (66.7 kg)    Physical Exam Vitals and nursing note reviewed.  Constitutional:      General: He is not in acute distress.    Appearance: He is well-developed. He is not diaphoretic.     Comments: Well-appearing thin elderly 79 year old, comfortable, cooperative  HENT:     Head: Normocephalic and atraumatic.  Eyes:     General:        Right eye: No discharge.        Left eye: No discharge.     Conjunctiva/sclera: Conjunctivae normal.  Cardiovascular:     Rate and Rhythm: Normal rate.  Pulmonary:     Effort: Pulmonary effort is normal.  Skin:    General: Skin is warm and dry.     Findings: No erythema or rash.  Neurological:     Mental Status: He is alert and oriented to person, place, and time.  Psychiatric:        Behavior: Behavior normal.     Comments: Well groomed, good eye contact, normal speech and thoughts    Results for orders placed or performed in visit on 01/20/20  POCT Urinalysis Dipstick  Result Value Ref Range   Color, UA amber    Clarity, UA clear    Glucose, UA Negative  Negative   Bilirubin, UA Negative    Ketones, UA Negative    Spec Grav, UA 1.010 1.010 - 1.025   Blood, UA trace    pH, UA 5.0 5.0 - 8.0   Protein, UA Negative Negative   Urobilinogen, UA 0.2 0.2 or 1.0 E.U./dL   Nitrite, UA Negative    Leukocytes, UA Negative Negative   Appearance     Odor        Assessment & Plan:   Problem List Items Addressed This Visit    BPH with obstruction/lower urinary tract symptoms - Primary    Uncontrolled BPH, 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 Inadequate control on Tamsulosin 0.4mg  long term med Variety of Severe LUTS and nocturia primarily bothering him - Last PSA 1.0 (123XX123) - Last DRE uncertain - No known personal/family history of prostate CA  Plan: 1. Discussion on BPH symptoms uncontrolled. Will proceed with BPH treatment for now, based on his symptoms Check UA dipstick today, trace RBC Check urine culture to rule out UTI  - Start increase current med  Tamsulosin 0.4mg  x 2 = 0.8mg  daily (can take 2 in AM for now if prefer can split to BID dosing or QHS - based on his preference) reviewed side effects and risk, caution dizzy lightheaded -Also Start Finasteride 5mg  daily, to shrink prostate, will reduce PSA in future by nearly 50% expected, may take 2-6 months for full benefit  Offered DRE - he declined today, will perform at next visit as discussed  Follow-up as planned. Discussed that if not noticeable improvement in symptoms - may have to consider consultation with Urologist, but he declines this today.      Relevant Medications   tamsulosin (FLOMAX) 0.4 MG CAPS capsule   finasteride (PROSCAR) 5 MG tablet   Other Relevant Orders   Urine Culture    Other Visit Diagnoses    Urine frequency       Relevant Orders   POCT Urinalysis Dipstick (Completed)   Urine Culture   Abnormal urinalysis       Relevant Orders   Urine Culture   Nocturia             Orders Placed This Encounter  Procedures  . Urine Culture  . POCT Urinalysis Dipstick     Meds ordered this encounter  Medications  . finasteride (PROSCAR) 5 MG tablet    Sig: Take 1 tablet (5 mg total) by mouth daily.    Dispense:  90 tablet    Refill:  1      Follow up plan: Return in about 3 months (around 04/21/2020) for keep apt scheduled.   [name], [facility] 01/20/2020, 2:35 PM

## 2020-01-22 LAB — URINE CULTURE
MICRO NUMBER:: 10297162
Result:: NO GROWTH
SPECIMEN QUALITY:: ADEQUATE

## 2020-02-09 ENCOUNTER — Ambulatory Visit: Payer: Self-pay | Admitting: General Practice

## 2020-02-09 ENCOUNTER — Telehealth: Payer: Medicare Other

## 2020-02-09 NOTE — Chronic Care Management (AMB) (Signed)
  Chronic Care Management   Outreach Note  02/09/2020 Name: Zachary Brewer MRN: [id number] DOB: 10-10-1941  Referred by: [name], DO Reason for referral : Chronic Care Management (2nd attempt Initial: Chronic Disease management and care coordination needs)   A second unsuccessful telephone outreach was attempted today. The patient was referred to the case management team for assistance with care management and care coordination. The patient's daughter Zachary Brewer was at work and could not speak at this time. She ask for the [facility] to call back at 4 on another day.    Follow Up Plan: The care management team will reach out to the patient again over the next 30 days.   Noreene Larsson RN, MSN, [place] Mobile: [phone]

## 2020-02-21 DIAGNOSIS — I10 Essential (primary) hypertension: Secondary | ICD-10-CM | POA: Diagnosis not present

## 2020-02-21 DIAGNOSIS — I493 Ventricular premature depolarization: Secondary | ICD-10-CM | POA: Diagnosis not present

## 2020-03-05 ENCOUNTER — Ambulatory Visit (INDEPENDENT_AMBULATORY_CARE_PROVIDER_SITE_OTHER): Payer: Medicare Other | Admitting: General Practice

## 2020-03-05 ENCOUNTER — Telehealth: Payer: Medicare Other | Admitting: General Practice

## 2020-03-05 DIAGNOSIS — E782 Mixed hyperlipidemia: Secondary | ICD-10-CM

## 2020-03-05 DIAGNOSIS — I10 Essential (primary) hypertension: Secondary | ICD-10-CM

## 2020-03-05 NOTE — Chronic Care Management (AMB) (Signed)
Chronic Care Management   Initial Visit Note  03/05/2020 Name: Zachary Brewer MRN: [id number] DOB: [birth year]  Referred by: [name], DO Reason for referral : Chronic Care Management (Initial outreach- 2nd call)   Zachary Brewer is a 79 y.o. year old male who is a primary care patient of [name], DO. The CCM team was consulted for assistance with chronic disease management and care coordination needs related to HTN and HLD  Review of patient status, including review of consultants reports, relevant laboratory and other test results, and collaboration with appropriate care team members and the patient's provider was performed as part of comprehensive patient evaluation and provision of chronic care management services.    SDOH (Social Determinants of Health) assessments performed: Yes See Care Plan activities for detailed interventions related to SDOH  SDOH Interventions     Most Recent Value  SDOH Interventions  SDOH Interventions for the Following Domains  Physical Activity, Social Connections  Physical Activity Interventions  Other (Comments) [the patient walks the block several times a week. Adequate exercise]  Social Connections Interventions  Other (Comment) [good support system, no issues related to socialization]  Alcohol Brief Interventions/Follow-up  AUDIT Score <7 follow-up not indicated       Medications: Outpatient Encounter Medications as of 03/05/2020  Medication Sig   ACCU-CHEK AVIVA PLUS test strip USE TO CHECK BLOOD SUGAR ONCE A DAY   Accu-Chek FastClix Lancets MISC Use to check blood sugar up to 1 x weekly   aspirin EC 81 MG tablet Take 81 mg by mouth daily.   Blood Glucose Monitoring Suppl (ACCU-CHEK AVIVA PLUS) w/Device KIT Use glucometer to check blood sugar 1 x weekly as advised   Calcium Carb-Cholecalciferol (CALCIUM-VITAMIN D) 500-400 MG-UNIT TABS TAKE ONE TABLET BY MOUTH TWICE DAILY WITH MEALS   Cholecalciferol (VITAMIN D3)  1000 units CAPS Take 1,000 Units by mouth daily.   enalapril (VASOTEC) 20 MG tablet Take 1.5 tablets (30 mg total) by mouth daily.   finasteride (PROSCAR) 5 MG tablet Take 1 tablet (5 mg total) by mouth daily.   Glucosamine-Chondroit-Vit C-Mn (GLUCOSAMINE 1500 COMPLEX PO) Take 1,500 mg by mouth daily.   Multiple Vitamin (MULTIVITAMIN) tablet Take 1 tablet by mouth daily. Reported on 11/21/2015   omeprazole (PRILOSEC) 20 MG capsule Take 1 capsule (20 mg total) by mouth 2 (two) times daily before a meal.   psyllium (METAMUCIL) 58.6 % powder Take 1 packet by mouth daily.   tamsulosin (FLOMAX) 0.4 MG CAPS capsule Take 2 capsules (0.8 mg total) by mouth daily after breakfast.   No facility-administered encounter medications on file as of 03/05/2020.     Objective:  BP Readings from Last 3 Encounters:  01/20/20 115/70  10/06/19 130/70  07/12/19 110/85    Goals Addressed            This Visit's Progress    RNCM: Pt's daughter: "We are just a little concerned about his heart" (pt-stated)       CARE PLAN ENTRY (see longtitudinal plan of care for additional care plan information)  Current Barriers:   Chronic Disease Management support, education, and care coordination needs related to HTN and HLD  Clinical Goal(s) related to HTN and HLD:  Over the next 120 days, patient will:   Work with the care management team to address educational, disease management, and care coordination needs   Begin or continue self health monitoring activities as directed today Measure and record blood pressure 2 times per week  and adhere to a heart healthy diet  Call provider office for new or worsened signs and symptoms Blood pressure findings outside established parameters, Shortness of breath, and New or worsened symptom related to HLD and other chronic conditions  Call care management team with questions or concerns  Verbalize basic understanding of patient centered plan of care established  today  Interventions related to HTN and HLD:   Evaluation of current treatment plans and patient's adherence to plan as established by provider  Assessed patient understanding of disease states  Assessed patient's education and care coordination needs  Provided disease specific education to patient   Collaborated with appropriate clinical care team members regarding patient needs  Patient Self Care Activities related to HTN and HLD:   Patient is unable to independently self-manage chronic health conditions  Initial goal documentation         Zachary Brewer was given information about Chronic Care Management services today including:  1. CCM service includes personalized support from designated clinical staff supervised by his physician, including individualized plan of care and coordination with other care providers 2. 24/7 contact phone numbers for assistance for urgent and routine care needs. 3. Service will only be billed when office clinical staff spend 20 minutes or more in a month to coordinate care. 4. Only one practitioner may furnish and bill the service in a calendar month. 5. The patient may stop CCM services at any time (effective at the end of the month) by phone call to the office staff. 6. The patient will be responsible for cost sharing (co-pay) of up to 20% of the service fee (after annual deductible is met).  Patient agreed to services and verbal consent obtained.   Plan:   The care management team will reach out to the patient again over the next 60 to 90 days.   Noreene Larsson RN, MSN, [place] Mobile: [phone]

## 2020-03-05 NOTE — Patient Instructions (Signed)
Visit Information  Goals Addressed            This Visit's Progress    RNCM: Pt's daughter: "We are just a little concerned about his heart" (pt-stated)       CARE PLAN ENTRY (see longtitudinal plan of care for additional care plan information)  Current Barriers:   Chronic Disease Management support, education, and care coordination needs related to HTN and HLD  Clinical Goal(s) related to HTN and HLD:  Over the next 120 days, patient will:   Work with the care management team to address educational, disease management, and care coordination needs   Begin or continue self health monitoring activities as directed today Measure and record blood pressure 2 times per week and adhere to a heart healthy diet  Call provider office for new or worsened signs and symptoms Blood pressure findings outside established parameters, Shortness of breath, and New or worsened symptom related to HLD and other chronic conditions  Call care management team with questions or concerns  Verbalize basic understanding of patient centered plan of care established today  Interventions related to HTN and HLD:   Evaluation of current treatment plans and patient's adherence to plan as established by provider  Assessed patient understanding of disease states  Assessed patient's education and care coordination needs  Provided disease specific education to patient   Collaborated with appropriate clinical care team members regarding patient needs  Patient Self Care Activities related to HTN and HLD:   Patient is unable to independently self-manage chronic health conditions  Initial goal documentation        Mr. Fujiwara was given information about Chronic Care Management services today including:  1. CCM service includes personalized support from designated clinical staff supervised by his physician, including individualized plan of care and coordination with other care providers 2. 24/7 contact phone  numbers for assistance for urgent and routine care needs. 3. Service will only be billed when office clinical staff spend 20 minutes or more in a month to coordinate care. 4. Only one practitioner may furnish and bill the service in a calendar month. 5. The patient may stop CCM services at any time (effective at the end of the month) by phone call to the office staff. 6. The patient will be responsible for cost sharing (co-pay) of up to 20% of the service fee (after annual deductible is met).  Patient agreed to services and verbal consent obtained.   Patient verbalizes understanding of instructions provided today.   The care management team will reach out to the patient again over the next 60 to 90 days.   Noreene Larsson RN, MSN, [place] Mobile: [phone]

## 2020-04-09 ENCOUNTER — Other Ambulatory Visit: Payer: Self-pay | Admitting: Family Medicine

## 2020-04-09 ENCOUNTER — Other Ambulatory Visit: Payer: Self-pay

## 2020-04-09 ENCOUNTER — Encounter: Payer: Self-pay | Admitting: Family Medicine

## 2020-04-09 ENCOUNTER — Ambulatory Visit (INDEPENDENT_AMBULATORY_CARE_PROVIDER_SITE_OTHER): Payer: Medicare Other | Admitting: Family Medicine

## 2020-04-09 VITALS — BP 123/61 | HR 68 | Temp 97.7°F | Resp 16 | Ht 67.0 in | Wt 147.4 lb

## 2020-04-09 DIAGNOSIS — Z0000 Encounter for general adult medical examination without abnormal findings: Secondary | ICD-10-CM

## 2020-04-09 DIAGNOSIS — N138 Other obstructive and reflux uropathy: Secondary | ICD-10-CM

## 2020-04-09 DIAGNOSIS — I10 Essential (primary) hypertension: Secondary | ICD-10-CM

## 2020-04-09 DIAGNOSIS — M1120 Other chondrocalcinosis, unspecified site: Secondary | ICD-10-CM

## 2020-04-09 DIAGNOSIS — N401 Enlarged prostate with lower urinary tract symptoms: Secondary | ICD-10-CM

## 2020-04-09 DIAGNOSIS — R739 Hyperglycemia, unspecified: Secondary | ICD-10-CM

## 2020-04-09 DIAGNOSIS — E782 Mixed hyperlipidemia: Secondary | ICD-10-CM

## 2020-04-09 NOTE — Assessment & Plan Note (Signed)
Improved BPH on Finasteride and dose adjust Tamsulosin History Variety of Severe LUTS and nocturia primarily bothering him - Last PSA 1.0 (09/2019) - note now on Finasteride - Last DRE uncertain - No known personal/family history of prostate CA  Plan: 1. Discussion on BPH symptoms uncontrolled 2. Tamsulosin 0.4mg  x 2 daily 3. Finasteride 5mg  daily - started 12/2019 - anticipate reduced PSA  Follow-up as planned. Discussed that if not noticeable improvement in symptoms - may have to consider consultation with Urologist, but he declines this today.

## 2020-04-09 NOTE — Assessment & Plan Note (Signed)
Controlled with lifestyle.  Normal A1c without dx Pre-DM - Concern with fam history  Plan:  1. Not on any therapy currently  2. Encourage improved lifestyle - low carb, low sugar diet, do not need to restrict portion size 3. Follow-up A1c 09/2020

## 2020-04-09 NOTE — Progress Notes (Signed)
Subjective:    Patient ID: Zachary Brewer, male    DOB: 16-Mar-1941, 79 y.o.   MRN: [id number]  ESKER DEVER is a 79 y.o. male presenting on 04/09/2020 for Hypertension   HPI   CHRONIC HTN: Reportsno new concerns. Doing well. BP readings have been controlled. Current Meds -Enalapril 30mg  (takes 1.5 of the 20mg  tabs) Reports good compliance, took meds today. Tolerating well, w/o complaints. Rare dizziness episode Denies CP, dyspnea, HA, edema, dizziness / lightheadedness  Elevated Blood Sugar: No concerns. Doing well, balanced diet,family history of Diabetes (brother, sister, and mother). A1c 5.3 on last lab, He still checks sugar weekly for his own reference. CBGs: Avg95, Low>80 High< 120. Checks CBGsx 1 weeklyfasting- has detailed log Meds:None (never) Currently on ACEi Lifestyle: - Diet:Healthy diet. No significant change - Exercise: regular activity some walking Denies hypoglycemia  History of Gastric Outlet Obstruction (S/p Roux-en-Y surgery) / History of weight loss / GERD: - Briefly reviews prior history of s/p gastric surgery in pastsee prior note for background information - Tolerating diet, has denturesbut problem with denture adhesive, see below, stays well hydrated - Taking Omeprazole 20mg  BIDfor GERD  BPH Reports chronic problem. Doing well on Flomax 0.4mg  x2 daily and Finasteride 5mg . With improvement last visit 12/2019 he was newly started on Finasteride with some improved LUTS.  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   HM COVID19 vaccine updated.  Depression screen [facility] 2/9 04/09/2020 03/05/2020 10/06/2019  Decreased Interest 0 0 0  Down, Depressed, Hopeless 0 0 0  PHQ - 2 Score 0 0 0    Social History   Tobacco Use  .  Smoking status: Never Smoker  . Smokeless tobacco: Never Used  Vaping Use  . Vaping Use: Never used  Substance Use Topics  . Alcohol use: No  . Drug use: No    Review of Systems Per HPI unless specifically indicated above     Objective:    BP 123/61   Pulse 68   Temp 97.7 F (36.5 C) (Temporal)   Resp 16   Ht 5\' 7"  (1.702 m)   Wt 147 lb 6.4 oz (66.9 kg)   SpO2 97%   BMI 23.09 kg/m   Wt Readings from Last 3 Encounters:  04/09/20 147 lb 6.4 oz (66.9 kg)  01/20/20 147 lb (66.7 kg)  10/06/19 149 lb (67.6 kg)    Physical Exam Vitals and nursing note reviewed.  Constitutional:      General: He is not in acute distress.    Appearance: He is well-developed. He is not diaphoretic.     Comments: Well-appearing thin elderly male, comfortable, cooperative  HENT:     Head: Normocephalic and atraumatic.  Eyes:     General:        Right eye: No discharge.        Left eye: No discharge.     Conjunctiva/sclera: Conjunctivae normal.  Neck:     Thyroid: No thyromegaly.  Cardiovascular:     Rate and Rhythm: Normal rate and regular rhythm.     Heart sounds: Normal heart sounds. No murmur heard.   Pulmonary:     Effort: Pulmonary effort is normal. No respiratory distress.     Breath sounds: Normal breath sounds. No wheezing or rales.  Musculoskeletal:        General:  Normal range of motion.     Cervical back: Normal range of motion and neck supple.  Lymphadenopathy:     Cervical: No cervical adenopathy.  Skin:    General: Skin is warm and dry.     Findings: No erythema or rash.  Neurological:     Mental Status: He is alert and oriented to person, place, and time.  Psychiatric:        Behavior: Behavior normal.     Comments: Well groomed, good eye contact, normal speech and thoughts        Results for orders placed or performed in visit on 01/20/20  Urine Culture   Specimen: Urine  Result Value Ref Range   MICRO NUMBER: [id number]    SPECIMEN QUALITY: Adequate     Sample Source URINE    STATUS: FINAL    Result: No Growth   POCT Urinalysis Dipstick  Result Value Ref Range   Color, UA amber    Clarity, UA clear    Glucose, UA Negative Negative   Bilirubin, UA Negative    Ketones, UA Negative    Spec Grav, UA 1.010 1.010 - 1.025   Blood, UA trace    pH, UA 5.0 5.0 - 8.0   Protein, UA Negative Negative   Urobilinogen, UA 0.2 0.2 or 1.0 E.U./dL   Nitrite, UA Negative    Leukocytes, UA Negative Negative   Appearance     Odor        Assessment & Plan:   Problem List Items Addressed This Visit    Pseudogout    Controlled Now without flare >2-3 year Followed by [facility] Rheum Off Colchicine prophylaxis      Essential (primary) hypertension - Primary    Well-controlled HTN - Home BP readings normal, detailed log  No known complications    Plan:  1.  Continue current BP regimen - Enalapril 30mg  daily 2. Encourage improved lifestyle - low sodium diet, regular exercise 3. Continue monitor BP outside office, bring readings to next visit, if persistently >140/90 or new symptoms notify office sooner 4. Follow-up q 6 months      BPH with obstruction/lower urinary tract symptoms    Improved BPH on Finasteride and dose adjust Tamsulosin History Variety of Severe LUTS and nocturia primarily bothering him - Last PSA 1.0 (09/2019) - note now on Finasteride - Last DRE uncertain - No known personal/family history of prostate CA  Plan: 1. Discussion on BPH symptoms uncontrolled 2. Tamsulosin 0.4mg  x 2 daily 3. Finasteride 5mg  daily - started 12/2019 - anticipate reduced PSA  Follow-up as planned. Discussed that if not noticeable improvement in symptoms - may have to consider consultation with Urologist, but he declines this today.      Blood glucose elevated    Controlled with lifestyle.  Normal A1c without dx Pre-DM - Concern with fam history  Plan:  1. Not on any therapy currently  2. Encourage improved lifestyle - low carb, low sugar diet,  do not need to restrict portion size 3. Follow-up A1c 09/2020         No orders of the defined types were placed in this encounter.     Follow up plan: Return in about 6 months (around 10/09/2020) for Annual Physical.  Future labs ordered for 10/02/20  [name], [facility] 04/09/2020, 8:09 AM

## 2020-04-09 NOTE — Assessment & Plan Note (Signed)
Well-controlled HTN - Home BP readings normal, detailed log  No known complications    Plan:  1.  Continue current BP regimen - Enalapril 30mg  daily 2. Encourage improved lifestyle - low sodium diet, regular exercise 3. Continue monitor BP outside office, bring readings to next visit, if persistently >140/90 or new symptoms notify office sooner 4. Follow-up q 6 months

## 2020-04-09 NOTE — Assessment & Plan Note (Addendum)
Controlled Now without flare >2-3 year Followed by [facility] Rheum Off Colchicine prophylaxis

## 2020-04-09 NOTE — Patient Instructions (Addendum)
Thank you for coming to the office today.  Bring COVID19 vaccine card to next visit.  Keep up the good work.  Current medications. No changes.  DUE for FASTING BLOOD WORK (no food or drink after midnight before the lab appointment, only water or coffee without cream/sugar on the morning of)  SCHEDULE "Lab Only" visit in the morning at the clinic for lab draw in 6 MONTHS   - Make sure Lab Only appointment is at about 1 week before your next appointment, so that results will be available  For Lab Results, once available within 2-3 days of blood draw, you can can log in to MyChart online to view your results and a brief explanation. Also, we can discuss results at next follow-up visit.    Please schedule a Follow-up Appointment to: Return in about 6 months (around 10/09/2020) for Annual Physical.  If you have any other questions or concerns, please feel free to call the office or send a message through [place]. You may also schedule an earlier appointment if necessary.  Additionally, you may be receiving a survey about your experience at our office within a few days to 1 week by e-mail or mail. We value your feedback.  [name], DO [place]

## 2020-05-02 DIAGNOSIS — M8949 Other hypertrophic osteoarthropathy, multiple sites: Secondary | ICD-10-CM | POA: Diagnosis not present

## 2020-05-02 DIAGNOSIS — M1180 Other specified crystal arthropathies, unspecified site: Secondary | ICD-10-CM | POA: Diagnosis not present

## 2020-05-24 ENCOUNTER — Telehealth: Payer: Self-pay

## 2020-06-26 DIAGNOSIS — I10 Essential (primary) hypertension: Secondary | ICD-10-CM | POA: Diagnosis not present

## 2020-06-26 DIAGNOSIS — I493 Ventricular premature depolarization: Secondary | ICD-10-CM | POA: Diagnosis not present

## 2020-06-26 DIAGNOSIS — I499 Cardiac arrhythmia, unspecified: Secondary | ICD-10-CM | POA: Diagnosis not present

## 2020-06-28 ENCOUNTER — Telehealth: Payer: Self-pay

## 2020-06-28 ENCOUNTER — Telehealth: Payer: Self-pay | Admitting: General Practice

## 2020-06-28 NOTE — Telephone Encounter (Signed)
  Chronic Care Management   Outreach Note  06/28/2020 Name: JOHNTAE BROXTERMAN MRN: [id number] DOB: 10-24-1941  Referred by: [name], DO Reason for referral : Chronic Care Management (RNCM Follow up Call for Chronic Disease Managment and Care Coordination Needs)   An unsuccessful telephone outreach was attempted today. The patient was referred to the case management team for assistance with care management and care coordination. The patients daughter [name] was working and does not get off until 4 pm. Request a call after 4 pm. Will reschedule at a later date.   Follow Up Plan: The care management team will reach out to the patient again over the next 30 days.   Noreene Larsson RN, MSN, [place] Mobile: [phone]

## 2020-07-12 ENCOUNTER — Telehealth: Payer: Self-pay

## 2020-07-12 ENCOUNTER — Telehealth: Payer: Self-pay | Admitting: General Practice

## 2020-07-12 NOTE — Telephone Encounter (Signed)
  Chronic Care Management   Outreach Note  07/12/2020 Name: Zachary Brewer MRN: [id number] DOB: 20-Aug-1941  Referred by: [name], DO Reason for referral : Chronic Care Management (RNCM Follow up call for chronic disease managment and care coordination needs.)   A second unsuccessful telephone outreach was attempted today. The patient was referred to the case management team for assistance with care management and care coordination.   Follow Up Plan: A HIPAA compliant phone message was left for the patient providing contact information and requesting a return call.   Noreene Larsson RN, MSN, [place] Mobile: [phone]

## 2020-07-18 ENCOUNTER — Other Ambulatory Visit: Payer: Self-pay | Admitting: Family Medicine

## 2020-07-18 DIAGNOSIS — N138 Other obstructive and reflux uropathy: Secondary | ICD-10-CM

## 2020-08-23 ENCOUNTER — Telehealth: Payer: Self-pay | Admitting: General Practice

## 2020-08-23 ENCOUNTER — Ambulatory Visit: Payer: Self-pay | Admitting: General Practice

## 2020-08-23 DIAGNOSIS — I10 Essential (primary) hypertension: Secondary | ICD-10-CM

## 2020-08-23 DIAGNOSIS — M8949 Other hypertrophic osteoarthropathy, multiple sites: Secondary | ICD-10-CM

## 2020-08-23 DIAGNOSIS — M159 Polyosteoarthritis, unspecified: Secondary | ICD-10-CM

## 2020-08-23 DIAGNOSIS — E782 Mixed hyperlipidemia: Secondary | ICD-10-CM

## 2020-08-23 NOTE — Chronic Care Management (AMB) (Signed)
Chronic Care Management   Follow Up Note   08/23/2020 Name: Zachary Brewer MRN: [id number] DOB: 02/26/1941  Referred by: [name], DO Reason for referral : Chronic Care Management (RNCM Follow up for Chronic Disease Management and Care Coordination Needs)   Zachary Brewer is a 79 y.o. year old male who is a primary care patient of [name], DO. The CCM team was consulted for assistance with chronic disease management and care coordination needs.    Review of patient status, including review of consultants reports, relevant laboratory and other test results, and collaboration with appropriate care team members and the patient's provider was performed as part of comprehensive patient evaluation and provision of chronic care management services.    SDOH (Social Determinants of Health) assessments performed: Yes See Care Plan activities for detailed interventions related to [facility])     Outpatient Encounter Medications as of 08/23/2020  Medication Sig  . ACCU-CHEK AVIVA PLUS test strip USE TO CHECK BLOOD SUGAR ONCE A DAY  . Accu-Chek FastClix Lancets MISC Use to check blood sugar up to 1 x weekly  . aspirin EC 81 MG tablet Take 81 mg by mouth daily.  . Blood Glucose Monitoring Suppl (ACCU-CHEK AVIVA PLUS) w/Device KIT Use glucometer to check blood sugar 1 x weekly as advised  . Calcium Carb-Cholecalciferol (CALCIUM-VITAMIN D) 500-400 MG-UNIT TABS TAKE ONE TABLET BY MOUTH TWICE DAILY WITH MEALS  . Cholecalciferol (VITAMIN D3) 1000 units CAPS Take 1,000 Units by mouth daily. (Patient not taking: Reported on 04/09/2020)  . enalapril (VASOTEC) 20 MG tablet Take 1.5 tablets (30 mg total) by mouth daily.  . finasteride (PROSCAR) 5 MG tablet TAKE ONE TABLET BY MOUTH ONCE DAILY  . Glucosamine-Chondroit-Vit C-Mn (GLUCOSAMINE 1500 COMPLEX PO) Take 1,500 mg by mouth daily.  . Multiple Vitamin (MULTIVITAMIN) tablet Take 1 tablet by mouth daily. Reported on 11/21/2015  .  omeprazole (PRILOSEC) 20 MG capsule Take 1 capsule (20 mg total) by mouth 2 (two) times daily before a meal.  . psyllium (METAMUCIL) 58.6 % powder Take 1 packet by mouth daily.  . tamsulosin (FLOMAX) 0.4 MG CAPS capsule Take 2 capsules (0.8 mg total) by mouth daily after breakfast.   No facility-administered encounter medications on file as of 08/23/2020.     Objective:  BP Readings from Last 3 Encounters:  04/09/20 123/61  01/20/20 115/70  10/06/19 130/70    Goals Addressed              This Visit's Progress   .  RNCM: Pt's daughter: "We are just a little concerned about his heart" (pt-stated)        CARE PLAN ENTRY (see longtitudinal plan of care for additional care plan information)  Current Barriers:  . Chronic Disease Management support, education, and care coordination needs related to HTN and HLD  Clinical Goal(s) related to HTN and HLD:  Over the next 120 days, patient will:  . Work with the care management team to address educational, disease management, and care coordination needs  . Begin or continue self health monitoring activities as directed today Measure and record blood pressure 2 times per week and adhere to a heart healthy diet . Call provider office for new or worsened signs and symptoms Blood pressure findings outside established parameters, Shortness of breath, and New or worsened symptom related to HLD and other chronic conditions . Call care management team with questions or concerns . Verbalize basic understanding of patient centered plan of care established  today  Interventions related to HTN and HLD:  . Evaluation of current treatment plans and patient's adherence to plan as established by provider.  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 He has gel that he puts on it and he takes an aspirin every now and then to help with it.  The patient states he feeling good.  He was getting ready  to take a bath before the call.   . Assessed patient understanding of disease states.  The patient has a good understanding of chronic conditions. The patient states that he has seen his specialist. He is complaint with recommendations of the providers. States he is getting his COVID booster on 08-24-2020, they will come to his apartment and give it to him.  . Assessed patient's education and care coordination needs.  08-23-2020: The patient was concerned because he had not heard from the cardiologist for a return follow up visit. Call with the patient to [facility] and spoke to the scheduler.  The patient was seen on 06-26-2020 and does not need to come back until next year. The schedule is not out for next year but they will contact him when it is out.  . Evaluation of blood pressure readings. The patient states that his blood pressure was a little high this am because he took Nyquil last night. After taking his medications it was 142/69 and HR was 50.  The patient denies any concerns with his blood pressure. He tries to monitor it well and keep it well controlled.  States he is compliant with a heart healthy diet and does not add salt to his food.  . Provided disease specific education to patient.  Education on calling the provider for changes in condition and any worsening sx/sx of arthritis/HTN/HLD or other chronic conditions. The patient states he is feeling good and talks to his neighbors on a regular bases.  He doesn't get out much but likes to socialize with the neighbors when he can.  Nash Dimmer with appropriate clinical care team members regarding patient needs.  The patient knows about pharmacist and LCSW the patient denies any needs at this time. Will continue to monitor. . Evaluation of upcoming appointments. The patient sees the pcp on 10-09-2020 at 0800, he will have blood work the week before his appointment.   Patient Self Care Activities related to HTN and HLD:  . Patient is  unable to independently self-manage chronic health conditions  Please see past updates related to this goal by clicking on the "Past Updates" button in the selected goal          Plan:   Telephone follow up appointment with care management team member scheduled for: 10-18-2020 at [place] am   [place], MSN, [facility] Mobile: [phone]

## 2020-08-23 NOTE — Patient Instructions (Signed)
Visit Information  Goals Addressed              This Visit's Progress     RNCM: Pt's daughter: "We are just a little concerned about his heart" (pt-stated)        CARE PLAN ENTRY (see longtitudinal plan of care for additional care plan information)  Current Barriers:   Chronic Disease Management support, education, and care coordination needs related to HTN and HLD  Clinical Goal(s) related to HTN and HLD:  Over the next 120 days, patient will:   Work with the care management team to address educational, disease management, and care coordination needs   Begin or continue self health monitoring activities as directed today Measure and record blood pressure 2 times per week and adhere to a heart healthy diet  Call provider office for new or worsened signs and symptoms Blood pressure findings outside established parameters, Shortness of breath, and New or worsened symptom related to HLD and other chronic conditions  Call care management team with questions or concerns  Verbalize basic understanding of patient centered plan of care established today  Interventions related to HTN and HLD:   Evaluation of current treatment plans and patient's adherence to plan as established by provider.  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 He has gel that he puts on it and he takes an aspirin every now and then to help with it.  The patient states he feeling good.  He was getting ready to take a bath before the call.    Assessed patient understanding of disease states.  The patient has a good understanding of chronic conditions. The patient states that he has seen his specialist. He is complaint with recommendations of the providers. States he is getting his COVID booster on 08-24-2020, they will come to his apartment and give it to him.   Assessed patient's education and care coordination needs.  08-23-2020: The patient was  concerned because he had not heard from the cardiologist for a return follow up visit. Call with the patient to [facility] and spoke to the scheduler.  The patient was seen on 06-26-2020 and does not need to come back until next year. The schedule is not out for next year but they will contact him when it is out.   Evaluation of blood pressure readings. The patient states that his blood pressure was a little high this am because he took Nyquil last night. After taking his medications it was 142/69 and HR was 50.  The patient denies any concerns with his blood pressure. He tries to monitor it well and keep it well controlled.  States he is compliant with a heart healthy diet and does not add salt to his food.   Provided disease specific education to patient.  Education on calling the provider for changes in condition and any worsening sx/sx of arthritis/HTN/HLD or other chronic conditions. The patient states he is feeling good and talks to his neighbors on a regular bases.  He doesn't get out much but likes to socialize with the neighbors when he can.   Collaborated with appropriate clinical care team members regarding patient needs.  The patient knows about pharmacist and LCSW the patient denies any needs at this time. Will continue to monitor.  Evaluation of upcoming appointments. The patient sees the pcp on 10-09-2020 at 0800, he will have blood work the week before his appointment.  Patient Self Care Activities related to HTN and HLD:   Patient is unable to independently self-manage chronic health conditions  Please see past updates related to this goal by clicking on the "Past Updates" button in the selected goal         Patient verbalizes understanding of instructions provided today.   Telephone follow up appointment with care management team member scheduled for: 10-18-2020 at [place] am  Noreene Larsson RN, MSN, [place] Mobile: [phone]

## 2020-09-24 DIAGNOSIS — M1180 Other specified crystal arthropathies, unspecified site: Secondary | ICD-10-CM | POA: Diagnosis not present

## 2020-09-24 DIAGNOSIS — Z23 Encounter for immunization: Secondary | ICD-10-CM | POA: Diagnosis not present

## 2020-09-24 DIAGNOSIS — M170 Bilateral primary osteoarthritis of knee: Secondary | ICD-10-CM | POA: Diagnosis not present

## 2020-09-24 DIAGNOSIS — M8949 Other hypertrophic osteoarthropathy, multiple sites: Secondary | ICD-10-CM | POA: Diagnosis not present

## 2020-10-01 ENCOUNTER — Other Ambulatory Visit: Payer: Self-pay | Admitting: *Deleted

## 2020-10-01 DIAGNOSIS — R739 Hyperglycemia, unspecified: Secondary | ICD-10-CM

## 2020-10-01 DIAGNOSIS — I10 Essential (primary) hypertension: Secondary | ICD-10-CM

## 2020-10-01 DIAGNOSIS — Z0000 Encounter for general adult medical examination without abnormal findings: Secondary | ICD-10-CM

## 2020-10-01 DIAGNOSIS — E782 Mixed hyperlipidemia: Secondary | ICD-10-CM

## 2020-10-01 DIAGNOSIS — M1120 Other chondrocalcinosis, unspecified site: Secondary | ICD-10-CM

## 2020-10-01 DIAGNOSIS — N401 Enlarged prostate with lower urinary tract symptoms: Secondary | ICD-10-CM

## 2020-10-01 DIAGNOSIS — N138 Other obstructive and reflux uropathy: Secondary | ICD-10-CM

## 2020-10-02 ENCOUNTER — Other Ambulatory Visit: Payer: Medicare Other

## 2020-10-02 ENCOUNTER — Other Ambulatory Visit: Payer: Self-pay

## 2020-10-02 DIAGNOSIS — N138 Other obstructive and reflux uropathy: Secondary | ICD-10-CM | POA: Diagnosis not present

## 2020-10-02 DIAGNOSIS — E782 Mixed hyperlipidemia: Secondary | ICD-10-CM | POA: Diagnosis not present

## 2020-10-02 DIAGNOSIS — I10 Essential (primary) hypertension: Secondary | ICD-10-CM | POA: Diagnosis not present

## 2020-10-02 DIAGNOSIS — R739 Hyperglycemia, unspecified: Secondary | ICD-10-CM | POA: Diagnosis not present

## 2020-10-02 LAB — CBC WITH DIFFERENTIAL/PLATELET
Eosinophils Relative: 0.1 %
Hemoglobin: 15.9 g/dL (ref 13.2–17.1)
MCV: 94 fL (ref 80.0–100.0)
MPV: 11 fL (ref 7.5–12.5)

## 2020-10-03 LAB — CBC WITH DIFFERENTIAL/PLATELET
Absolute Monocytes: 510 cells/uL (ref 200–950)
Basophils Absolute: 20 cells/uL (ref 0–200)
Basophils Relative: 0.2 %
Eosinophils Absolute: 10 cells/uL — ABNORMAL LOW (ref 15–500)
HCT: 46.9 % (ref 38.5–50.0)
Lymphs Abs: 1051 cells/uL (ref 850–3900)
MCH: 31.9 pg (ref 27.0–33.0)
MCHC: 33.9 g/dL (ref 32.0–36.0)
Monocytes Relative: 5 %
Neutro Abs: 8609 cells/uL — ABNORMAL HIGH (ref 1500–7800)
Neutrophils Relative %: 84.4 %
Platelets: 195 10*3/uL (ref 140–400)
RBC: 4.99 10*6/uL (ref 4.20–5.80)
RDW: 12.8 % (ref 11.0–15.0)
Total Lymphocyte: 10.3 %
WBC: 10.2 10*3/uL (ref 3.8–10.8)

## 2020-10-03 LAB — COMPLETE METABOLIC PANEL WITH GFR
AG Ratio: 2 (calc) (ref 1.0–2.5)
ALT: 16 U/L (ref 9–46)
AST: 16 U/L (ref 10–35)
Albumin: 4.3 g/dL (ref 3.6–5.1)
Alkaline phosphatase (APISO): 84 U/L (ref 35–144)
BUN: 21 mg/dL (ref 7–25)
CO2: 29 mmol/L (ref 20–32)
Calcium: 9.3 mg/dL (ref 8.6–10.3)
Chloride: 101 mmol/L (ref 98–110)
Creat: 0.82 mg/dL (ref 0.70–1.11)
GFR, Est African American: 96 mL/min/{1.73_m2} (ref >=60)
GFR, Est Non African American: 83 mL/min/{1.73_m2} (ref >=60)
Globulin: 2.1 g/dL (calc) (ref 1.9–3.7)
Glucose, Bld: 99 mg/dL (ref 65–99)
Potassium: 3.9 mmol/L (ref 3.5–5.3)
Sodium: 141 mmol/L (ref 135–146)
Total Bilirubin: 1.7 mg/dL — ABNORMAL HIGH (ref 0.2–1.2)
Total Protein: 6.4 g/dL (ref 6.1–8.1)

## 2020-10-03 LAB — HEMOGLOBIN A1C
Hgb A1c MFr Bld: 5.3 % of total Hgb (ref <5.7)
Mean Plasma Glucose: 105 mg/dL
eAG (mmol/L): 5.8 mmol/L

## 2020-10-03 LAB — LIPID PANEL
Cholesterol: 138 mg/dL (ref <200)
HDL: 47 mg/dL (ref >=40)
LDL Cholesterol (Calc): 74 mg/dL (calc)
Non-HDL Cholesterol (Calc): 91 mg/dL (calc) (ref <130)
Total CHOL/HDL Ratio: 2.9 (calc) (ref <5.0)
Triglycerides: 86 mg/dL (ref <150)

## 2020-10-03 LAB — PSA: PSA: 0.62 ng/mL (ref <=4.0)

## 2020-10-08 ENCOUNTER — Other Ambulatory Visit: Payer: Self-pay | Admitting: Family Medicine

## 2020-10-08 DIAGNOSIS — R739 Hyperglycemia, unspecified: Secondary | ICD-10-CM

## 2020-10-08 MED ORDER — ACCU-CHEK FASTCLIX LANCETS MISC: 1 refills | Status: AC

## 2020-10-08 NOTE — Telephone Encounter (Signed)
Medication: Accu-Chek FastClix Lancets MISC [[id number]]   Has the patient contacted their pharmacy? YES  (Agent: If no, request that the patient contact the pharmacy for the refill.) (Agent: If yes, when and what did the pharmacy advise?)  Preferred Pharmacy (with phone number or street name): [place], [place] [address] [place] [address] Phone: [phone] Fax: [phone] Hours: Not open 24 hours    Agent: Please be advised that RX refills may take up to 3 business days. We ask that you follow-up with your pharmacy.

## 2020-10-09 ENCOUNTER — Other Ambulatory Visit: Payer: Self-pay

## 2020-10-09 ENCOUNTER — Ambulatory Visit (INDEPENDENT_AMBULATORY_CARE_PROVIDER_SITE_OTHER): Payer: Medicare Other | Admitting: Family Medicine

## 2020-10-09 ENCOUNTER — Encounter: Payer: Self-pay | Admitting: Family Medicine

## 2020-10-09 VITALS — BP 114/60 | HR 69 | Temp 97.5°F | Resp 16 | Ht 67.0 in | Wt 146.6 lb

## 2020-10-09 DIAGNOSIS — N401 Enlarged prostate with lower urinary tract symptoms: Secondary | ICD-10-CM

## 2020-10-09 DIAGNOSIS — N138 Other obstructive and reflux uropathy: Secondary | ICD-10-CM

## 2020-10-09 DIAGNOSIS — M159 Polyosteoarthritis, unspecified: Secondary | ICD-10-CM

## 2020-10-09 DIAGNOSIS — E782 Mixed hyperlipidemia: Secondary | ICD-10-CM

## 2020-10-09 DIAGNOSIS — Z0000 Encounter for general adult medical examination without abnormal findings: Secondary | ICD-10-CM

## 2020-10-09 DIAGNOSIS — M8949 Other hypertrophic osteoarthropathy, multiple sites: Secondary | ICD-10-CM

## 2020-10-09 DIAGNOSIS — I10 Essential (primary) hypertension: Secondary | ICD-10-CM | POA: Diagnosis not present

## 2020-10-09 DIAGNOSIS — M1120 Other chondrocalcinosis, unspecified site: Secondary | ICD-10-CM

## 2020-10-09 MED ORDER — TAMSULOSIN HCL 0.4 MG PO CAPS: 0.8000 mg | ORAL_CAPSULE | Freq: Every day | ORAL | 1 refills | Status: DC

## 2020-10-09 NOTE — Assessment & Plan Note (Signed)
Well-controlled HTN - Home BP readings normal, detailed log - occasional elevated SBP before takes med  No known complications    Plan:  1.  Continue current BP regimen - Enalapril 30mg  daily 2. Encourage improved lifestyle - low sodium diet, regular exercise 3. Continue monitor BP outside office, bring readings to next visit, if persistently >140/90 or new symptoms notify office sooner 4. Follow-up q 6 months

## 2020-10-09 NOTE — Progress Notes (Signed)
Subjective:    Patient ID: Zachary Brewer, male    DOB: [birth year], 79 y.o.   MRN: [id number]  Zachary Brewer is a 79 y.o. male presenting on 10/09/2020 for Annual Exam   HPI   Here for Annual Physical and Lab Review.  CHRONIC HTN: Reportsno new concerns. Doing well. BP readings have been controlled. Current Meds -Enalapril 18m (takes 1.5 of the 268mtabs) Reports good compliance, took meds today. Tolerating well, w/o complaints. Rare dizziness episode Denies CP, dyspnea, HA, edema, dizziness / lightheadedness  History of Elevated Blood Sugar: No concerns. Doing well, balanced diet,family history of Diabetes (brother, sister, and mother). A1c 5.3 on last lab, He still checks sugar weekly for his own reference. CBGs: Avg95, Low>80 High< 120. Checks CBGsx 1 weeklyfasting- has detailed log Meds:None (never) Currently on ACEi Lifestyle: - Diet:Healthy diet. No significant change - Exercise: regular activity some walking Denies hypoglycemia  History of Gastric Outlet Obstruction (S/p Roux-en-Y surgery) / History of weight loss / GERD: - Briefly reviews prior history of s/p gastric surgery in pastsee prior note for background information - Tolerating diet, has denturesbut problem with denture adhesive, see below, stays well hydrated - Taking Omeprazole 2098mIDfor GERD  BPH Reports chronic problem. Doing well on Flomax 0.4mg30m daily and Finasteride 5mg.36meds refill Tamslosin today. He has occasional issues if holds urine may have urgency and mild incontinence if worse on one particular day, he wears depends pull ups Overall improved LUTS  Pseudogout Arthropathy (CPDD) - History of multiple joint pain andOA/DJD and Gouty arthritis(wrists and knee).Now he is following,[facility] (Dr BehalAnnalee Gentantrolled on regular Tylenol dosing - He has been on prednisone PRN in past.  HM COVID19 vaccine updated.  PSA 0.62 (09/2020)  Depression screen PHQ  [facility] 10/09/2020 04/09/2020 03/05/2020  Decreased Interest 0 0 0  Down, Depressed, Hopeless 0 0 0  PHQ - 2 Score 0 0 0    Past Medical History:  Diagnosis Date  . Allergy   . Arthritis 05/30/2015   Gout - Left knee and ankle  . Cervical pain 05/30/2015  . GERD (gastroesophageal reflux disease)   . Hyperlipidemia   . Hypertension   . 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 Wears dentures    full upper and lower (doesn't wear lower)  . Wears hearing aid    bilateral   Past Surgical History:  Procedure Laterality Date  . CATARACT EXTRACTION W/PHACO Right 06/11/2016   Procedure: CATARACT EXTRACTION PHACO AND INTRAOCULAR LENS PLACEMENT (IOC);Meeteetseurgeon: ChadwLeandrew Koyanagi  [facility]: [facility]hthal[facility];  Laterality: Right;  . CATARACT EXTRACTION W/PHACO Left 07/09/2016   Procedure: CATARACT EXTRACTION PHACO AND INTRAOCULAR LENS PLACEMENT (IOC);  Surgeon: ChadwLeandrew Koyanagi  [facility]: Ophthalmology;  Laterality: Left;  MALYUGIN  . ESOPHAGOGASTRODUODENOSCOPY (EGD) WITH PROPOFOL N/A 06/20/2015   Procedure: ESOPHAGOGASTRODUODENOSCOPY (EGD) WITH PROPOFOL with dialation;  Surgeon: DarreLucilla Lame  [facility];  Laterality: N/A;  . FLEXIBLE BRONCHOSCOPY N/A 06/22/2015   Procedure: FLEXIBLE BRONCHOSCOPY;  Surgeon: SaadaAllyne Gee  Location: [facility];  Service: Pulmonary;  Laterality: N/A;  . GASTRIC OUTLET OBSTRUCTION RELEASE  2013  . 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  . UPPER GI ENDOSCOPY     Social History   Socioeconomic History  . Marital status: Widowed    Spouse name: Not on file  . Number of children: Not on file  . Years of education: Not  on file  . Highest education level: 6th grade  Occupational History  . Occupation: retired  Tobacco Use  . Smoking status: Never Smoker  . Smokeless tobacco: Never Used  Vaping Use  . Vaping Use: Never used  Substance and Sexual Activity  . Alcohol use:  No  . Drug use: No  . Sexual activity: Not on file  Other Topics Concern  . Not on file  Social History Narrative  . Not on file   Social Determinants of Health   Financial Resource Strain: Low Risk   . Difficulty of Paying Living Expenses: Not hard at all  Food Insecurity: No Food Insecurity  . Worried About Charity fundraiser in the Last Year: Never true  . Ran Out of Food in the Last Year: Never true  Transportation Needs: No Transportation Needs  . Lack of Transportation (Medical): No  . Lack of Transportation (Non-Medical): No  Physical Activity: Insufficiently Active  . Days of Exercise per Week: 3 days  . Minutes of Exercise per Session: 30 min  Stress: No Stress Concern Present  . Feeling of Stress : Not at all  Social Connections: Moderately Isolated  . Frequency of Communication with Friends and Family: More than three times a week  . Frequency of Social Gatherings with Friends and Family: More than three times a week  . Attends Religious Services: More than 4 times per year  . Active Member of Clubs or Organizations: No  . Attends Archivist Meetings: Never  . Marital Status: Widowed  Intimate Partner Violence: Not At Risk  . Fear of Current or Ex-Partner: No  . Emotionally Abused: No  . Physically Abused: No  . Sexually Abused: No   Family History  Problem Relation Age of Onset  . Diabetes Mother   . Alzheimer's disease Mother   . Heart disease Father   . Diabetes Sister   . Diabetes Brother   . Stroke Brother   . Mental illness Brother   . Hypertension Paternal Grandfather    Current Outpatient Medications on File Prior to Visit  Medication Sig  . ACCU-CHEK AVIVA PLUS test strip USE TO CHECK BLOOD SUGAR ONCE A DAY  . Accu-Chek FastClix Lancets MISC Use to check blood sugar up to 1 x weekly  . aspirin EC 81 MG tablet Take 81 mg by mouth daily.  . Blood Glucose Monitoring Suppl (ACCU-CHEK AVIVA PLUS) w/Device KIT Use glucometer to check blood  sugar 1 x weekly as advised  . Calcium Carb-Cholecalciferol (CALCIUM-VITAMIN D) 500-400 MG-UNIT TABS TAKE ONE TABLET BY MOUTH TWICE DAILY WITH MEALS  . Cholecalciferol (VITAMIN D3) 1000 units CAPS Take 1,000 Units by mouth daily.  . enalapril (VASOTEC) 20 MG tablet Take 1.5 tablets (30 mg total) by mouth daily.  . finasteride (PROSCAR) 5 MG tablet TAKE ONE TABLET BY MOUTH ONCE DAILY  . Glucosamine-Chondroit-Vit C-Mn (GLUCOSAMINE 1500 COMPLEX PO) Take 1,500 mg by mouth daily.  . Multiple Vitamin (MULTIVITAMIN) tablet Take 1 tablet by mouth daily. Reported on 11/21/2015  . omeprazole (PRILOSEC) 20 MG capsule Take 1 capsule (20 mg total) by mouth 2 (two) times daily before a meal.  . psyllium (METAMUCIL) 58.6 % powder Take 1 packet by mouth daily.  . tamsulosin (FLOMAX) 0.4 MG CAPS capsule Take 2 capsules (0.8 mg total) by mouth daily after breakfast.   No current facility-administered medications on file prior to visit.    Review of Systems  Constitutional: Negative for activity change, appetite change, chills,  diaphoresis, fatigue and fever.  HENT: Negative for congestion and hearing loss.   Eyes: Negative for visual disturbance.  Respiratory: Negative for cough, chest tightness, shortness of breath and wheezing.   Cardiovascular: Negative for chest pain, palpitations and leg swelling.  Gastrointestinal: Negative for abdominal pain, constipation, diarrhea, nausea and vomiting.  Endocrine: Negative for cold intolerance.  Genitourinary: Negative for dysuria, frequency and hematuria.  Musculoskeletal: Negative for arthralgias and neck pain.  Skin: Negative for rash.  Allergic/Immunologic: Negative for environmental allergies.  Neurological: Negative for dizziness, weakness, light-headedness, numbness and headaches.  Hematological: Negative for adenopathy.  Psychiatric/Behavioral: Negative for behavioral problems, dysphoric mood and sleep disturbance.   Per HPI unless specifically indicated  above      Objective:    BP 114/60   Pulse 69   Temp (!) 97.5 F (36.4 C) (Temporal)   Resp 16   Ht '5\' 7"'  (1.702 m)   Wt 146 lb 9.6 oz (66.5 kg)   SpO2 99%   BMI 22.96 kg/m   Wt Readings from Last 3 Encounters:  10/09/20 146 lb 9.6 oz (66.5 kg)  04/09/20 147 lb 6.4 oz (66.9 kg)  01/20/20 147 lb (66.7 kg)    Physical Exam Vitals and nursing note reviewed.  Constitutional:      General: He is not in acute distress.    Appearance: He is well-developed and well-nourished. He is not diaphoretic.     Comments: Well-appearing, comfortable, cooperative  HENT:     Head: Normocephalic and atraumatic.     Mouth/Throat:     Mouth: Oropharynx is clear and moist.  Eyes:     General:        Right eye: No discharge.        Left eye: No discharge.     Extraocular Movements: EOM normal.     Conjunctiva/sclera: Conjunctivae normal.     Pupils: Pupils are equal, round, and reactive to light.  Neck:     Thyroid: No thyromegaly.  Cardiovascular:     Rate and Rhythm: Normal rate and regular rhythm.     Pulses: Intact distal pulses.     Heart sounds: Normal heart sounds. No murmur heard.   Pulmonary:     Effort: Pulmonary effort is normal. No respiratory distress.     Breath sounds: Normal breath sounds. No wheezing or rales.  Abdominal:     General: Bowel sounds are normal. There is no distension.     Palpations: Abdomen is soft. There is no mass.     Tenderness: There is no abdominal tenderness.  Musculoskeletal:        General: No tenderness or edema. Normal range of motion.     Cervical back: Normal range of motion and neck supple.     Right lower leg: No edema.     Left lower leg: No edema.     Comments: Upper / Lower Extremities: - Normal muscle tone, strength bilateral upper extremities 5/5, lower extremities 5/5  Lymphadenopathy:     Cervical: No cervical adenopathy.  Skin:    General: Skin is warm and dry.     Findings: No erythema or rash.  Neurological:     Mental  Status: He is alert and oriented to person, place, and time.     Comments: Distal sensation intact to light touch all extremities  Psychiatric:        Mood and Affect: Mood and affect normal.        Behavior: Behavior normal.  Comments: Well groomed, good eye contact, normal speech and thoughts       Results for orders placed or performed in visit on 10/01/20  PSA  Result Value Ref Range   PSA 0.62 < OR = 4.0 ng/mL  Lipid panel  Result Value Ref Range   Cholesterol 138 <200 mg/dL   HDL 47 > OR = 40 mg/dL   Triglycerides 86 <150 mg/dL   LDL Cholesterol (Calc) 74 mg/dL (calc)   Total CHOL/HDL Ratio 2.9 <5.0 (calc)   Non-HDL Cholesterol (Calc) 91 <130 mg/dL (calc)  COMPLETE METABOLIC PANEL WITH GFR  Result Value Ref Range   Glucose, Bld 99 65 - 99 mg/dL   BUN 21 7 - 25 mg/dL   Creat 0.82 0.70 - 1.11 mg/dL   GFR, Est Non African American 83 > OR = 60 mL/min/1.49m   GFR, Est African American 96 > OR = 60 mL/min/1.720m  BUN/Creatinine Ratio NOT APPLICABLE 6 - 22 (calc)   Sodium 141 135 - 146 mmol/L   Potassium 3.9 3.5 - 5.3 mmol/L   Chloride 101 98 - 110 mmol/L   CO2 29 20 - 32 mmol/L   Calcium 9.3 8.6 - 10.3 mg/dL   Total Protein 6.4 6.1 - 8.1 g/dL   Albumin 4.3 3.6 - 5.1 g/dL   Globulin 2.1 1.9 - 3.7 g/dL (calc)   AG Ratio 2.0 1.0 - 2.5 (calc)   Total Bilirubin 1.7 (H) 0.2 - 1.2 mg/dL   Alkaline phosphatase (APISO) 84 35 - 144 U/L   AST 16 10 - 35 U/L   ALT 16 9 - 46 U/L  CBC with Differential/Platelet  Result Value Ref Range   WBC 10.2 3.8 - 10.8 Thousand/uL   RBC 4.99 4.20 - 5.80 Million/uL   Hemoglobin 15.9 13.2 - 17.1 g/dL   HCT 46.9 38.5 - 50.0 %   MCV 94.0 80.0 - 100.0 fL   MCH 31.9 27.0 - 33.0 pg   MCHC 33.9 32.0 - 36.0 g/dL   RDW 12.8 11.0 - 15.0 %   Platelets 195 140 - 400 Thousand/uL   MPV 11.0 7.5 - 12.5 fL   Neutro Abs 8,609 (H) 1,500 - 7,800 cells/uL   Lymphs Abs 1,051 850 - 3,900 cells/uL   Absolute Monocytes 510 200 - 950 cells/uL    Eosinophils Absolute 10 (L) 15 - 500 cells/uL   Basophils Absolute 20 0 - 200 cells/uL   Neutrophils Relative % 84.4 %   Total Lymphocyte 10.3 %   Monocytes Relative 5.0 %   Eosinophils Relative 0.1 %   Basophils Relative 0.2 %  Hemoglobin A1c  Result Value Ref Range   Hgb A1c MFr Bld 5.3 <5.7 % of total Hgb   Mean Plasma Glucose 105 mg/dL   eAG (mmol/L) 5.8 mmol/L      Assessment & Plan:   Problem List Items Addressed This Visit    Pseudogout    See A&P      Osteoarthritis of multiple joints    [facility] for multiple joint pain, also with pseudogout. - No longer on opioids for pain management - Continue Tylenol regularly dosing Meds per Rheum previous Prednisone - RICE therapy      Hyperlipidemia    Controlled cholesterol on lifestyle Last lipid panel 09/2020 Calculated ASCVD 10 yr risk score elevated due to age, HTN  Plan: 1. Briefly reviewed ASCVD risk - consider low dose statin for future risk reduction, but agree to defer for now,  patient not interested in starting new med 2. Continue ASA 26m for primary ASCVD risk reduction 3. Encourage improved lifestyle - low carb/cholesterol, reduce portion size, continue improving regular exercise 4. Follow-up yearly lipids      Essential (primary) hypertension    Well-controlled HTN - Home BP readings normal, detailed log - occasional elevated SBP before takes med  No known complications    Plan:  1.  Continue current BP regimen - Enalapril 390mdaily 2. Encourage improved lifestyle - low sodium diet, regular exercise 3. Continue monitor BP outside office, bring readings to next visit, if persistently >140/90 or new symptoms notify office sooner 4. Follow-up q 6 months      BPH with obstruction/lower urinary tract symptoms    Improved BPH on Finasteride and dose adjust Tamsulosin History Variety of Severe LUTS and nocturia primarily bothering him - Last PSA 0.62 (09/2020) with  anticipated drop in PSA while on Finasteride - Last DRE uncertain - No known personal/family history of prostate CA  Plan: 1. Discussion on BPH symptoms uncontrolled 2. Tamsulosin 0.91m62m 2 daily 3. Finasteride 5mg49mily  Follow-up as planned. Discussed that if not noticeable improvement in symptoms - may have to consider consultation with Urologist      Relevant Medications   tamsulosin (FLOMAX) 0.4 MG CAPS capsule    Other Visit Diagnoses    Annual physical exam    -  Primary      Updated Health Maintenance information UTD COVID Vaccine, Flu Shot. Reviewed recent lab results with patient Encouraged improvement to lifestyle with diet and exercise - Goal of weight loss    No orders of the defined types were placed in this encounter.     Follow up plan: Return in about 6 months (around 04/09/2021) for 6 month follow-up HTN.  AlexNobie Putnam [facility] 10/09/2020, 8:15 AM

## 2020-10-09 NOTE — Assessment & Plan Note (Signed)
See A&P 

## 2020-10-09 NOTE — Patient Instructions (Addendum)
Thank you for coming to the office today.  Upcoming Cardiology apt at [facility] - 12/10/20 at 930am.  Keep up the great work!   Please schedule a Follow-up Appointment to: Return in about 6 months (around 04/09/2021) for 6 month follow-up HTN.  If you have any other questions or concerns, please feel free to call the office or send a message through [place]. You may also schedule an earlier appointment if necessary.  Additionally, you may be receiving a survey about your experience at our office within a few days to 1 week by e-mail or mail. We value your feedback.  [name], DO [place]

## 2020-10-09 NOTE — Assessment & Plan Note (Addendum)
Followed by [facility] primary etiology for multiple joint pain, also with pseudogout. - No longer on opioids for pain management - Continue Tylenol regularly dosing Meds per Rheum previous Prednisone - RICE therapy

## 2020-10-09 NOTE — Assessment & Plan Note (Signed)
Controlled cholesterol on lifestyle Last lipid panel 09/2020 Calculated ASCVD 10 yr risk score elevated due to age, HTN  Plan: 1. Briefly reviewed ASCVD risk - consider low dose statin for future risk reduction, but agree to defer for now, patient not interested in starting new med 2. Continue ASA 81mg  for primary ASCVD risk reduction 3. Encourage improved lifestyle - low carb/cholesterol, reduce portion size, continue improving regular exercise 4. Follow-up yearly lipids

## 2020-10-09 NOTE — Assessment & Plan Note (Addendum)
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 Discussion on BPH symptoms uncontrolled 2. Tamsulosin 0.4mg  x 2 daily 3. Finasteride 5mg  daily  Follow-up as planned. Discussed that if not noticeable improvement in symptoms - may have to consider consultation with Urologist

## 2020-10-18 ENCOUNTER — Telehealth: Payer: Self-pay | Admitting: General Practice

## 2020-10-18 ENCOUNTER — Ambulatory Visit: Payer: Self-pay | Admitting: General Practice

## 2020-10-18 DIAGNOSIS — E782 Mixed hyperlipidemia: Secondary | ICD-10-CM

## 2020-10-18 DIAGNOSIS — I10 Essential (primary) hypertension: Secondary | ICD-10-CM

## 2020-10-18 DIAGNOSIS — M159 Polyosteoarthritis, unspecified: Secondary | ICD-10-CM

## 2020-10-18 NOTE — Patient Instructions (Signed)
Visit Information  Goals Addressed              This Visit's Progress   .  RNCM: Pt's daughter: "We are just a little concerned about his heart" (pt-stated)        CARE PLAN ENTRY (see longtitudinal plan of care for additional care plan information)  Current Barriers:  . Chronic Disease Management support, education, and care coordination needs related to HTN and HLD  Clinical Goal(s) related to HTN and HLD:  Over the next 120 days, patient will:  . Work with the care management team to address educational, disease management, and care coordination needs  . Begin or continue self health monitoring activities as directed today Measure and record blood pressure 2 times per week and adhere to a heart healthy diet . Call provider office for new or worsened signs and symptoms Blood pressure findings outside established parameters, Shortness of breath, and New or worsened symptom related to HLD and other chronic conditions . Call care management team with questions or concerns . Verbalize basic understanding of patient centered plan of care established today  Interventions related to HTN and HLD:  . Evaluation of current treatment plans and patient's adherence to plan as established by provider.  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 He has gel that he puts on it and he takes an aspirin every now and then to help with it.  The patient states he feeling good.  He was getting ready to take a bath before the call.  10-18-2020:The patient states the last 2 mornings he was having issues walking with his arthritis. The patient states he has to take tylenol and liniment on it and got to going good. He usually is able to get up and go after that. He has to use his walker when he is like that and is safe when he has these pain episodes.  . Assessed patient understanding of disease states.  The patient has a good understanding of chronic  conditions. The patient states that he has seen his specialist. He is complaint with recommendations of the providers. States he is getting his COVID booster on 08-24-2020, they will come to his apartment and give it to him. 10-18-2020: The patient states he got his booster and got his flu vaccine also. Vaccines are up to date.  . Assessed patient's education and care coordination needs.  08-23-2020: The patient was concerned because he had not heard from the cardiologist for a return follow up visit. Call with the patient to [facility] and spoke to the scheduler.  The patient was seen on 06-26-2020 and does not need to come back until next year. The schedule is not out for next year but they will contact him when it is out.  . Evaluation of blood pressure readings. The patient states that his blood pressure was a little high this am because he took Nyquil last night. After taking his medications it was 142/69 and HR was 50.  The patient denies any concerns with his blood pressure. He tries to monitor it well and keep it well controlled.  States he is compliant with a heart healthy diet and does not add salt to his food. 10-18-2020: The patient denies any issues with compliance. Feels good for the most part except when his arthritis is acting up.  . Provided disease specific education to patient.  Education on calling the provider for  changes in condition and any worsening sx/sx of arthritis/HTN/HLD or other chronic conditions. The patient states he is feeling good and talks to his neighbors on a regular bases.  He doesn't get out much but likes to socialize with the neighbors when he can.  Nash Dimmer with appropriate clinical care team members regarding patient needs.  The patient knows about pharmacist and LCSW the patient denies any needs at this time. Will continue to monitor. . Evaluation of upcoming appointments. The patient sees the pcp on 04-16-2021 at 0800 am  Patient Self Care Activities  related to HTN and HLD:  . Patient is unable to independently self-manage chronic health conditions  Please see past updates related to this goal by clicking on the "Past Updates" button in the selected goal         The patient verbalized understanding of instructions, educational materials, and care plan provided today and declined offer to receive copy of patient instructions, educational materials, and care plan.   Telephone follow up appointment with care management team member scheduled for: 12-20-2020 at 0900 am  Noreene Larsson RN, MSN, [place] Mobile: [phone]

## 2020-10-18 NOTE — Chronic Care Management (AMB) (Signed)
Chronic Care Management   Follow Up Note   10/18/2020 Name: Zachary Brewer MRN: [id number] DOB: 1941/05/27  Referred by: [name], DO Reason for referral : Chronic Care Management (RNCM Follow up for Chronic Disease Management and Care Coordination Needs)   Zachary Brewer is a 79 y.o. year old male who is a primary care patient of [name], DO. The CCM team was consulted for assistance with chronic disease management and care coordination needs.    Review of patient status, including review of consultants reports, relevant laboratory and other test results, and collaboration with appropriate care team members and the patient's provider was performed as part of comprehensive patient evaluation and provision of chronic care management services.    SDOH (Social Determinants of Health) assessments performed: Yes See Care Plan activities for detailed interventions related to [facility])     Outpatient Encounter Medications as of 10/18/2020  Medication Sig   ACCU-CHEK AVIVA PLUS test strip USE TO CHECK BLOOD SUGAR ONCE A DAY   Accu-Chek FastClix Lancets MISC Use to check blood sugar up to 1 x weekly   aspirin EC 81 MG tablet Take 81 mg by mouth daily.   Blood Glucose Monitoring Suppl (ACCU-CHEK AVIVA PLUS) w/Device KIT Use glucometer to check blood sugar 1 x weekly as advised   Calcium Carb-Cholecalciferol (CALCIUM-VITAMIN D) 500-400 MG-UNIT TABS TAKE ONE TABLET BY MOUTH TWICE DAILY WITH MEALS   Cholecalciferol (VITAMIN D3) 1000 units CAPS Take 1,000 Units by mouth daily.   enalapril (VASOTEC) 20 MG tablet Take 1.5 tablets (30 mg total) by mouth daily.   finasteride (PROSCAR) 5 MG tablet TAKE ONE TABLET BY MOUTH ONCE DAILY   Glucosamine-Chondroit-Vit C-Mn (GLUCOSAMINE 1500 COMPLEX PO) Take 1,500 mg by mouth daily.   Multiple Vitamin (MULTIVITAMIN) tablet Take 1 tablet by mouth daily. Reported on 11/21/2015   omeprazole (PRILOSEC) 20 MG capsule Take 1 capsule  (20 mg total) by mouth 2 (two) times daily before a meal.   psyllium (METAMUCIL) 58.6 % powder Take 1 packet by mouth daily.   tamsulosin (FLOMAX) 0.4 MG CAPS capsule Take 2 capsules (0.8 mg total) by mouth daily after breakfast.   No facility-administered encounter medications on file as of 10/18/2020.     Objective:  BP Readings from Last 3 Encounters:  10/09/20 114/60  08/23/20 (!) 142/69  04/09/20 123/61    Goals Addressed              This Visit's Progress     RNCM: Pt's daughter: "We are just a little concerned about his heart" (pt-stated)        CARE PLAN ENTRY (see longtitudinal plan of care for additional care plan information)  Current Barriers:   Chronic Disease Management support, education, and care coordination needs related to HTN and HLD  Clinical Goal(s) related to HTN and HLD:  Over the next 120 days, patient will:   Work with the care management team to address educational, disease management, and care coordination needs   Begin or continue self health monitoring activities as directed today Measure and record blood pressure 2 times per week and adhere to a heart healthy diet  Call provider office for new or worsened signs and symptoms Blood pressure findings outside established parameters, Shortness of breath, and New or worsened symptom related to HLD and other chronic conditions  Call care management team with questions or concerns  Verbalize basic understanding of patient centered plan of care established today  Interventions related  to HTN and HLD:   Evaluation of current treatment plans and patient's adherence to plan as established by provider.  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 He has gel that he puts on it and he takes an aspirin every now and then to help with it.  The patient states he feeling good.  He was getting ready to take a bath before the call.  10-18-2020:The  patient states the last 2 mornings he was having issues walking with his arthritis. The patient states he has to take tylenol and liniment on it and got to going good. He usually is able to get up and go after that. He has to use his walker when he is like that and is safe when he has these pain episodes.   Assessed patient understanding of disease states.  The patient has a good understanding of chronic conditions. The patient states that he has seen his specialist. He is complaint with recommendations of the providers. States he is getting his COVID booster on 08-24-2020, they will come to his apartment and give it to him. 10-18-2020: The patient states he got his booster and got his flu vaccine also. Vaccines are up to date.   Assessed patient's education and care coordination needs.  08-23-2020: The patient was concerned because he had not heard from the cardiologist for a return follow up visit. Call with the patient to [facility] and spoke to the scheduler.  The patient was seen on 06-26-2020 and does not need to come back until next year. The schedule is not out for next year but they will contact him when it is out.   Evaluation of blood pressure readings. The patient states that his blood pressure was a little high this am because he took Nyquil last night. After taking his medications it was 142/69 and HR was 50.  The patient denies any concerns with his blood pressure. He tries to monitor it well and keep it well controlled.  States he is compliant with a heart healthy diet and does not add salt to his food. 10-18-2020: The patient denies any issues with compliance. Feels good for the most part except when his arthritis is acting up.   Provided disease specific education to patient.  Education on calling the provider for changes in condition and any worsening sx/sx of arthritis/HTN/HLD or other chronic conditions. The patient states he is feeling good and talks to his neighbors on a regular  bases.  He doesn't get out much but likes to socialize with the neighbors when he can.   Collaborated with appropriate clinical care team members regarding patient needs.  The patient knows about pharmacist and LCSW the patient denies any needs at this time. Will continue to monitor.  Evaluation of upcoming appointments. The patient sees the pcp on 04-16-2021 at 0800 am  Patient Self Care Activities related to HTN and HLD:   Patient is unable to independently self-manage chronic health conditions  Please see past updates related to this goal by clicking on the "Past Updates" button in the selected goal           Plan:   Telephone follow up appointment with care management team member scheduled for: 12-20-2020 at 0900 am   [place], MSN, [place] Mobile: [phone]

## 2020-11-19 ENCOUNTER — Other Ambulatory Visit: Payer: Self-pay

## 2020-11-19 ENCOUNTER — Ambulatory Visit (INDEPENDENT_AMBULATORY_CARE_PROVIDER_SITE_OTHER): Payer: Medicare Other | Admitting: Family Medicine

## 2020-11-19 ENCOUNTER — Encounter: Payer: Self-pay | Admitting: Family Medicine

## 2020-11-19 DIAGNOSIS — I10 Essential (primary) hypertension: Secondary | ICD-10-CM | POA: Diagnosis not present

## 2020-11-19 MED ORDER — ENALAPRIL MALEATE 20 MG PO TABS
20.0000 mg | ORAL_TABLET | Freq: Two times a day (BID) | ORAL | 1 refills | Status: DC
Start: 2020-11-19 — End: 2021-04-08

## 2020-11-19 NOTE — Assessment & Plan Note (Signed)
Previously controlled BP Now elevated readings with DBP >100-110 often lately. No other cause - seems to be only early AM late night readings, med may have worn off - Home BP readings normal, detailed log - occasional elevated SBP before takes med  No known complications    Plan:  1. SWITCH to BID dosing Enalapril 20mg  BID instead of 30mg  daily 2. Encourage improved lifestyle - low sodium diet, regular exercise 3. Continue monitor BP outside office, bring readings to next visit, if persistently >140/90 or new symptoms notify office sooner

## 2020-11-19 NOTE — Progress Notes (Signed)
Subjective:    Patient ID: Zachary Brewer, male    DOB: Apr 08, 1941, 80 y.o.   MRN: [id number]  Zachary Brewer is a 80 y.o. male presenting on 11/19/2020 for Hypertension  Patient presents for a same day appointment. Walk in today.   HPI   CHRONIC HTN: New concern with elevated BP recently. No attributing cause. Current Meds -Enalapril 30mg  (takes 1.5 of the 20mg  tabs) Reports good compliance, took meds today. Tolerating well, w/o complaints. Rare dizziness episode Denies CP, dyspnea, HA, edema, dizziness / lightheadedness   Depression screen [facility] 2/9 10/09/2020 04/09/2020 03/05/2020  Decreased Interest 0 0 0  Down, Depressed, Hopeless 0 0 0  PHQ - 2 Score 0 0 0    Social History   Tobacco Use  . Smoking status: Never Smoker  . Smokeless tobacco: Never Used  Vaping Use  . Vaping Use: Never used  Substance Use Topics  . Alcohol use: No  . Drug use: No    Review of Systems Per HPI unless specifically indicated above     Objective:    BP (!) 143/63 (BP Location: Left Arm, Patient Position: Sitting, Cuff Size: Normal)   Pulse 75   Ht 5\' 7"  (1.702 m)   Wt 149 lb 3.2 oz (67.7 kg)   BMI 23.37 kg/m   Wt Readings from Last 3 Encounters:  11/19/20 149 lb 3.2 oz (67.7 kg)  10/09/20 146 lb 9.6 oz (66.5 kg)  04/09/20 147 lb 6.4 oz (66.9 kg)    Physical Exam Vitals and nursing note reviewed.  Constitutional:      General: He is not in acute distress.    Appearance: He is well-developed and well-nourished. He is not diaphoretic.     Comments: Well-appearing, comfortable, cooperative  HENT:     Head: Normocephalic and atraumatic.     Mouth/Throat:     Mouth: Oropharynx is clear and moist.  Eyes:     General:        Right eye: No discharge.        Left eye: No discharge.     Conjunctiva/sclera: Conjunctivae normal.  Neck:     Thyroid: No thyromegaly.  Cardiovascular:     Rate and Rhythm: Normal rate and regular rhythm.     Pulses: Intact distal pulses.     Heart  sounds: Normal heart sounds. No murmur heard.   Pulmonary:     Effort: Pulmonary effort is normal. No respiratory distress.     Breath sounds: Normal breath sounds. No wheezing or rales.  Musculoskeletal:        General: No edema. Normal range of motion.     Cervical back: Normal range of motion and neck supple.  Lymphadenopathy:     Cervical: No cervical adenopathy.  Skin:    General: Skin is warm and dry.     Findings: No erythema or rash.  Neurological:     Mental Status: He is alert and oriented to person, place, and time.  Psychiatric:        Mood and Affect: Mood and affect normal.        Behavior: Behavior normal.     Comments: Well groomed, good eye contact, normal speech and thoughts    Results for orders placed or performed in visit on 10/01/20  PSA  Result Value Ref Range   PSA 0.62 < OR = 4.0 ng/mL  Lipid panel  Result Value Ref Range   Cholesterol 138 <200 mg/dL   HDL 47 >  OR = 40 mg/dL   Triglycerides 86 <245 mg/dL   LDL Cholesterol (Calc) 74 mg/dL (calc)   Total CHOL/HDL Ratio 2.9 <5.0 (calc)   Non-HDL Cholesterol (Calc) 91 <809 mg/dL (calc)  COMPLETE METABOLIC PANEL WITH GFR  Result Value Ref Range   Glucose, Bld 99 65 - 99 mg/dL   BUN 21 7 - 25 mg/dL   Creat 9.83 3.82 - 5.05 mg/dL   GFR, Est Non African American 83 > OR = 60 mL/min/1.88m2   GFR, Est African American 96 > OR = 60 mL/min/1.90m2   BUN/Creatinine Ratio NOT APPLICABLE 6 - 22 (calc)   Sodium 141 135 - 146 mmol/L   Potassium 3.9 3.5 - 5.3 mmol/L   Chloride 101 98 - 110 mmol/L   CO2 29 20 - 32 mmol/L   Calcium 9.3 8.6 - 10.3 mg/dL   Total Protein 6.4 6.1 - 8.1 g/dL   Albumin 4.3 3.6 - 5.1 g/dL   Globulin 2.1 1.9 - 3.7 g/dL (calc)   AG Ratio 2.0 1.0 - 2.5 (calc)   Total Bilirubin 1.7 (H) 0.2 - 1.2 mg/dL   Alkaline phosphatase (APISO) 84 35 - 144 U/L   AST 16 10 - 35 U/L   ALT 16 9 - 46 U/L  CBC with Differential/Platelet  Result Value Ref Range   WBC 10.2 3.8 - 10.8 Thousand/uL   RBC  4.99 4.20 - 5.80 Million/uL   Hemoglobin 15.9 13.2 - 17.1 g/dL   HCT 39.7 67.3 - 41.9 %   MCV 94.0 80.0 - 100.0 fL   MCH 31.9 27.0 - 33.0 pg   MCHC 33.9 32.0 - 36.0 g/dL   RDW 37.9 02.4 - 09.7 %   Platelets 195 140 - 400 Thousand/uL   MPV 11.0 7.5 - 12.5 fL   Neutro Abs 8,609 (H) 1,500 - 7,800 cells/uL   Lymphs Abs 1,051 850 - 3,900 cells/uL   Absolute Monocytes 510 200 - 950 cells/uL   Eosinophils Absolute 10 (L) 15 - 500 cells/uL   Basophils Absolute 20 0 - 200 cells/uL   Neutrophils Relative % 84.4 %   Total Lymphocyte 10.3 %   Monocytes Relative 5.0 %   Eosinophils Relative 0.1 %   Basophils Relative 0.2 %  Hemoglobin A1c  Result Value Ref Range   Hgb A1c MFr Bld 5.3 <5.7 % of total Hgb   Mean Plasma Glucose 105 mg/dL   eAG (mmol/L) 5.8 mmol/L      Assessment & Plan:   Problem List Items Addressed This Visit    Essential (primary) hypertension    Previously controlled BP Now elevated readings with DBP >100-110 often lately. No other cause - seems to be only early AM late night readings, med may have worn off - Home BP readings normal, detailed log - occasional elevated SBP before takes med  No known complications    Plan:  1. SWITCH to BID dosing Enalapril 20mg  BID instead of 30mg  daily 2. Encourage improved lifestyle - low sodium diet, regular exercise 3. Continue monitor BP outside office, bring readings to next visit, if persistently >140/90 or new symptoms notify office sooner      Relevant Medications   enalapril (VASOTEC) 20 MG tablet        Meds ordered this encounter  Medications  . enalapril (VASOTEC) 20 MG tablet    Sig: Take 1 tablet (20 mg total) by mouth 2 (two) times daily.    Dispense:  180 tablet    Refill:  1  Dose change      Follow up plan: Return if symptoms worsen or fail to improve.   [name], [facility] 11/19/2020, 1:36 PM

## 2020-12-10 DIAGNOSIS — I10 Essential (primary) hypertension: Secondary | ICD-10-CM | POA: Diagnosis not present

## 2020-12-10 DIAGNOSIS — I493 Ventricular premature depolarization: Secondary | ICD-10-CM | POA: Diagnosis not present

## 2020-12-20 ENCOUNTER — Telehealth: Payer: Self-pay | Admitting: General Practice

## 2020-12-20 ENCOUNTER — Ambulatory Visit (INDEPENDENT_AMBULATORY_CARE_PROVIDER_SITE_OTHER): Payer: Medicare Other | Admitting: General Practice

## 2020-12-20 DIAGNOSIS — E782 Mixed hyperlipidemia: Secondary | ICD-10-CM

## 2020-12-20 DIAGNOSIS — J45909 Unspecified asthma, uncomplicated: Secondary | ICD-10-CM | POA: Diagnosis not present

## 2020-12-20 DIAGNOSIS — I10 Essential (primary) hypertension: Secondary | ICD-10-CM | POA: Diagnosis not present

## 2020-12-20 DIAGNOSIS — R0602 Shortness of breath: Secondary | ICD-10-CM

## 2020-12-20 NOTE — Patient Instructions (Signed)
Visit Information  PATIENT GOALS: Patient Care Plan: RNCM: Hypertension (Adult)    Problem Identified: RNCM: Hypertension (Hypertension)   Priority: High    Long-Range Goal: RNCM: Hypertension Monitored   Priority: High  Note:   Objective:  . Last practice recorded BP readings:  BP Readings from Last 3 Encounters:  11/19/20 (!) 143/63  10/09/20 114/60  08/23/20 (!) 142/69 .   Marland Kitchen Most recent eGFR/CrCl: No results found for: EGFR  No components found for: CRCL Current Barriers:  Marland Kitchen Knowledge Deficits related to basic understanding of hypertension pathophysiology and self care management . Knowledge Deficits related to understanding of medications prescribed for management of hypertension . Limited Social Support . Unable to independently manage HTN . Lacks social connections . Does not contact provider office for questions/concerns Case Manager Clinical Goal(s):  Marland Kitchen Over the next 120 days, patient will verbalize understanding of plan for hypertension management . Over the next 120 days, patient will attend all scheduled medical appointments: 04-16-2021 at 0800 am . Over the next 120 days, patient will demonstrate improved adherence to prescribed treatment plan for hypertension as evidenced by taking all medications as prescribed, monitoring and recording blood pressure as directed, adhering to low sodium/DASH diet . Over the next 120 days, patient will demonstrate improved health management independence as evidenced by checking blood pressure as directed and notifying PCP if SBP>160 or DBP > 90, taking all medications as prescribe, and adhering to a low sodium diet as discussed. . Over the next 120 days, patient will verbalize basic understanding of hypertension disease process and self health management plan as evidenced by compliance to medications, compliance with heart healthy diet and working with the CCM team to optimize health and well being.  Interventions:  . Collaboration with  [name], DO regarding development and update of comprehensive plan of care as evidenced by provider attestation and co-signature . Inter-disciplinary care team collaboration (see longitudinal plan of care) . Evaluation of current treatment plan related to hypertension self management and patient's adherence to plan as established by provider. . Provided education to patient re: stroke prevention, s/s of heart attack and stroke, DASH diet, complications of uncontrolled blood pressure . Reviewed medications with patient and discussed importance of compliance. The patient is taking medication Enalapril BID. Review of blood pressure readings . Discussed plans with patient for ongoing care management follow up and provided patient with direct contact information for care management team . Advised patient, providing education and rationale, to monitor blood pressure daily and record, calling PCP for findings outside established parameters. The patient is having readings: systolic of 818-403 and diastolic 75-436. Education and support given to monitor for changes such as headache when readings are greater than 067 systolic and > 90 diastolic. The patient is recording numbers to bring to next visit.  . Reviewed scheduled/upcoming provider appointments including: 04-16-2021 at 0800 am Patient Goals: - blood pressure trends reviewed - depression screen reviewed - home or ambulatory blood pressure monitoring encouraged Self-Care Activities: - Self administers medications as prescribed Attends all scheduled provider appointments Calls provider office for new concerns, questions, or BP outside discussed parameters Checks BP and records as discussed Follows a low sodium diet/DASH diet Follow Up Plan: Telephone follow up appointment with care management team member scheduled for: 4-14-202 at 0900 am   Task: RNCM: Identify and Monitor Blood Pressure Elevation   Note:   Care Management  Activities:    - blood pressure trends reviewed - depression screen reviewed - home  or ambulatory blood pressure monitoring encouraged       Patient Care Plan: RNCM: HLD management    Problem Identified: RNCM: HLD Management   Priority: Medium    Long-Range Goal: RNCM: HLD Management   Priority: Medium  Note:   Current Barriers:  . Poorly controlled hyperlipidemia, complicated by elevated blood pressures . Current antihyperlipidemic regimen: no medications, diet controlled  . Most recent lipid panel:     Component Value Date/Time   CHOL 138 10/02/2020 0759   TRIG 86 10/02/2020 0759   HDL 47 10/02/2020 0759   CHOLHDL 2.9 10/02/2020 0759   VLDL 28 09/09/2016 0001   LDLCALC 74 10/02/2020 0759 .   Marland Kitchen ASCVD risk enhancing conditions: age >42, HTN  . Lacks social connections . Does not contact provider office for questions/concerns  RN Care Manager Clinical Goal(s):  Marland Kitchen Over the next 120 days, patient will work with Consulting civil engineer, providers, and care team towards execution of optimized self-health management plan . patient will verbalize understanding of plan for HLD  . patient will work with [facility] and pcp  to address needs related to HLD . patient will attend all scheduled medical appointments: 04-16-2021 at 0800 am . patient will work with care guides  (community agency) to assist with meal delivery such as M[employer] or [employer]  Interventions: . Collaboration with [name], DO regarding development and update of comprehensive plan of care as evidenced by provider attestation and co-signature . Inter-disciplinary care team collaboration (see longitudinal plan of care) . Medication review performed; medication list updated in electronic medical record.  Bertram Savin care team collaboration (see longitudinal plan of care) . Referred to pharmacy team for assistance with HLD medication management . Evaluation of current treatment plan related to HLD  and patient's adherence to plan as established by provider. . Advised patient to call the office for changes or questions  . Provided education to patient re: heart healthy diet, exercise when it is nice outside, taking medications as prescribed  . Reviewed scheduled/upcoming provider appointments including: 04-16-2021 0800 am . Work with care guides to assist with meal delivery options: M[employer] or [employer]  . Discussed plans with patient for ongoing care management follow up and provided patient with direct contact information for care management team   Patient Goals/Self-Care Activities: . Over the next 120 days, patient will:   - call for medicine refill 2 or 3 days before it runs out - call if I am sick and can't take my medicine - keep a list of all the medicines I take; vitamins and herbals too - learn to read medicine labels - use a pillbox to sort medicine - use an alarm clock or phone to remind me to take my medicine - change to whole grain breads, cereal, pasta - drink 6 to 8 glasses of water each day - eat 3 to 5 servings of fruits and vegetables each day - eat 5 or 6 small meals each day - fill half the plate with nonstarchy vegetables - limit fast food meals to no more than 1 per week - manage portion size - prepare main meal at home 3 to 5 days each week - read food labels for fat, fiber, carbohydrates and portion size - be open to making changes - I can manage, know and watch for signs of a heart attack - if I have chest pain, call for help - learn about small changes that will make a big  difference - learn my personal risk factors  - barriers to meeting goals identified - change-talk evoked - choices provided - collaboration with team encouraged - decision-making supported - health risks reviewed - problem-solving facilitated - questions answered - readiness for change evaluated - reassurance provided - resources needed to meet goals identified -  self-reflection promoted - self-reliance encouraged  Follow Up Plan: Telephone follow up appointment with care management team member scheduled for:  02-07-2021 at 0900 am   Task: RNCM: HLD Management   Note:   Care Management Activities:    - barriers to meeting goals identified - change-talk evoked - choices provided - collaboration with team encouraged - decision-making supported - health risks reviewed - problem-solving facilitated - questions answered - readiness for change evaluated - reassurance provided - resources needed to meet goals identified - self-reflection promoted - self-reliance encouraged       Patient Care Plan: RNCM: Airway hypersensitivity    Problem Identified: RNCM: Airway Hypersensitivity   Priority: High    Long-Range Goal: RNCM: Shortness of Breath/ Airway Hypersensitivity Disease Progression Minimized or Managed   Priority: High  Note:   Current Barriers:  Marland Kitchen Knowledge deficits related to basic understanding of shortness of breath/airway hypersensitivity disease process . Knowledge deficits related to basic shortness of breath self care/management . Knowledge deficit related to importance of energy conservation . Limited Social Support . Unable to independently manage new onset of shortness of breath and airway hypersensitivity  . Lacks social connections . Does not contact provider office for questions/concerns  Case Manager Clinical Goal(s):  Over the next 120 days patient will report utilizing pursed lip breathing for shortness of breath  Over the next 120 days, patient will be able to verbalize understanding of Shortness of breath action plan and when to seek appropriate levels of medical care  Over the next 120 days, patient will engage in lite exercise as tolerated to build/regain stamina and strength and reduce shortness of breath through activity tolerance  Over the next 120 days, patient will verbalize basic understanding of  Shortness of breath and airway hypersensitivity  disease process and self care activities  Interventions:  . Collaboration with [name], DO regarding development and update of comprehensive plan of care as evidenced by provider attestation and co-signature . Inter-disciplinary care team collaboration (see longitudinal plan of care)  UNABLE to independently: manage new onset of shortness of breath and airway hypersensitivity   Provided patient with basic written and verbal Shortness of Breath education on self care/management/and exacerbation prevention   Provided patient with shortness of breath  action plan and reinforced importance of daily self assessment  Provided written and verbal instructions on pursed lip breathing and utilized returned demonstration as teach back  Provided instruction about proper use of medications used for management of Shortness of breath including inhalers  Advised patient to self assesses Shortness of breath  action plan zone and make appointment with provider if in the yellow zone for 48 hours without improvement.  Provided patient with education about the role of exercise in the management of Shortness of breath and airway hypersensitivity   Advised patient to engage in light exercise as tolerated 3-5 days a week  Provided education about and advised patient to utilize infection prevention strategies to reduce risk of respiratory infection  Patient Goals/Self-Care Activities:  . - activity or exercise based on tolerance encouraged . - barriers to treatment managed . - breathing techniques encouraged . - communicable disease prevention promoted . -  individualized medical nutrition therapy provided . - medication-adherence assessment completed . - quality of sleep assessed . - rescue (action) plan reviewed . - screen for functional limitations completed and reviewed . - self-awareness of symptom triggers encouraged- the patient had some  plug ins he was using in his home and this was causing worsening symptoms. He has removed them from his home. He is also using a humidifier and an air purifier to help with air quality in his home.  Follow Up Plan: Telephone follow up appointment with care management team member scheduled for: 02-11-2021 at 0900 am   Task: RNCM: Management of shortness of breath   Note:   Care Management Activities:    - activity or exercise based on tolerance encouraged - barriers to treatment managed - breathing techniques encouraged - communicable disease prevention promoted - individualized medical nutrition therapy provided - medication-adherence assessment completed - quality of sleep assessed - rescue (action) plan reviewed - screen for functional limitations completed and reviewed - self-awareness of symptom triggers encouraged         Patient verbalizes understanding of instructions provided today and agrees to view in [place].   Telephone follow up appointment with care management team member scheduled for: 02-07-2021 at 0900 am  Noreene Larsson RN, MSN, [place] Mobile: [phone]

## 2020-12-20 NOTE — Chronic Care Management (AMB) (Signed)
Chronic Care Management   CCM RN Visit Note  12/20/2020 Name: Zachary Brewer MRN: [id number] DOB: 11-25-1940  Subjective: Zachary Brewer is a 80 y.o. year old male who is a primary care patient of [name], DO. The care management team was consulted for assistance with disease management and care coordination needs.    Engaged with patient by telephone for follow up visit in response to provider referral for case management and/or care coordination services.   Consent to Services:  The patient was given information about Chronic Care Management services, agreed to services, and gave verbal consent prior to initiation of services.  Please see initial visit note for detailed documentation.   Patient agreed to services and verbal consent obtained.   Assessment: Review of patient past medical history, allergies, medications, health status, including review of consultants reports, laboratory and other test data, was performed as part of comprehensive evaluation and provision of chronic care management services.   SDOH (Social Determinants of Health) assessments and interventions performed:    CCM Care Plan  No Known Allergies  Outpatient Encounter Medications as of 12/20/2020  Medication Sig  . ACCU-CHEK AVIVA PLUS test strip USE TO CHECK BLOOD SUGAR ONCE A DAY  . Accu-Chek FastClix Lancets MISC Use to check blood sugar up to 1 x weekly  . aspirin EC 81 MG tablet Take 81 mg by mouth daily.  . Blood Glucose Monitoring Suppl (ACCU-CHEK AVIVA PLUS) w/Device KIT Use glucometer to check blood sugar 1 x weekly as advised  . Calcium Carb-Cholecalciferol (CALCIUM-VITAMIN D) 500-400 MG-UNIT TABS TAKE ONE TABLET BY MOUTH TWICE DAILY WITH MEALS  . Cholecalciferol (VITAMIN D3) 1000 units CAPS Take 1,000 Units by mouth daily.  . enalapril (VASOTEC) 20 MG tablet Take 1 tablet (20 mg total) by mouth 2 (two) times daily.  . finasteride (PROSCAR) 5 MG tablet TAKE ONE TABLET BY MOUTH ONCE DAILY   . Glucosamine-Chondroit-Vit C-Mn (GLUCOSAMINE 1500 COMPLEX PO) Take 1,500 mg by mouth daily.  . Multiple Vitamin (MULTIVITAMIN) tablet Take 1 tablet by mouth daily. Reported on 11/21/2015  . omeprazole (PRILOSEC) 20 MG capsule Take 1 capsule (20 mg total) by mouth 2 (two) times daily before a meal.  . psyllium (METAMUCIL) 58.6 % powder Take 1 packet by mouth daily.  . tamsulosin (FLOMAX) 0.4 MG CAPS capsule Take 2 capsules (0.8 mg total) by mouth daily after breakfast.   No facility-administered encounter medications on file as of 12/20/2020.    Patient Active Problem List   Diagnosis Date Noted  . Calculus of gallbladder without cholecystitis without obstruction 07/06/2018  . Carpal tunnel syndrome on right 04/02/2018  . Encounter for long-term (current) use of high-risk medication 01/22/2017  . Vitamin D deficiency 10/24/2015  . Calcium pyrophosphate arthropathy of multiple sites 10/03/2015  . Chronic fatigue, unspecified 10/03/2015  . Chronic pain of left wrist 10/03/2015  . Chronic pain syndrome 09/24/2015  . Pseudogout 07/31/2015  . Hyperlipidemia 07/24/2015  . BPH with obstruction/lower urinary tract symptoms 07/24/2015  . Gastric outlet obstruction   . Swallowing difficulty   . Allergic rhinitis 05/30/2015  . 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/30/2015  . 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  . Bradycardia 05/30/2015  . Essential (primary) hypertension 05/30/2015  . Esophagitis, reflux 05/30/2015  . 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 Blood glucose elevated 05/30/2015  . Lump in scrotum 05/30/2015    Conditions to be addressed/monitored:HTN, HLD and Pulmonary Disease  Care Plan : RNCM: Hypertension (Adult)  Updates made by Dellie Catholic  J since 12/20/2020 12:00 AM    Problem: RNCM: Hypertension (Hypertension)   Priority: High    Long-Range Goal: RNCM: Hypertension Monitored   Priority: High  Note:   Objective:  . Last practice recorded BP readings:  BP Readings from Last 3  Encounters:  11/19/20 (!) 143/63  10/09/20 114/60  08/23/20 (!) 142/69 .   Marland Kitchen Most recent eGFR/CrCl: No results found for: EGFR  No components found for: CRCL Current Barriers:  Marland Kitchen Knowledge Deficits related to basic understanding of hypertension pathophysiology and self care management . Knowledge Deficits related to understanding of medications prescribed for management of hypertension . Limited Social Support . Unable to independently manage HTN . Lacks social connections . Does not contact provider office for questions/concerns Case Manager Clinical Goal(s):  Marland Kitchen Over the next 120 days, patient will verbalize understanding of plan for hypertension management . Over the next 120 days, patient will attend all scheduled medical appointments: 04-16-2021 at 0800 am . Over the next 120 days, patient will demonstrate improved adherence to prescribed treatment plan for hypertension as evidenced by taking all medications as prescribed, monitoring and recording blood pressure as directed, adhering to low sodium/DASH diet . Over the next 120 days, patient will demonstrate improved health management independence as evidenced by checking blood pressure as directed and notifying PCP if SBP>160 or DBP > 90, taking all medications as prescribe, and adhering to a low sodium diet as discussed. . Over the next 120 days, patient will verbalize basic understanding of hypertension disease process and self health management plan as evidenced by compliance to medications, compliance with heart healthy diet and working with the CCM team to optimize health and well being.  Interventions:  . Collaboration with [name], DO regarding development and update of comprehensive plan of care as evidenced by provider attestation and co-signature . Inter-disciplinary care team collaboration (see longitudinal plan of care) . Evaluation of current treatment plan related to hypertension self management and patient's  adherence to plan as established by provider. . Provided education to patient re: stroke prevention, s/s of heart attack and stroke, DASH diet, complications of uncontrolled blood pressure . Reviewed medications with patient and discussed importance of compliance. The patient is taking medication Enalapril BID. Review of blood pressure readings . Discussed plans with patient for ongoing care management follow up and provided patient with direct contact information for care management team . Advised patient, providing education and rationale, to monitor blood pressure daily and record, calling PCP for findings outside established parameters. The patient is having readings: systolic of 710-626 and diastolic 94-854. Education and support given to monitor for changes such as headache when readings are greater than 627 systolic and > 90 diastolic. The patient is recording numbers to bring to next visit.  . Reviewed scheduled/upcoming provider appointments including: 04-16-2021 at 0800 am Patient Goals: - blood pressure trends reviewed - depression screen reviewed - home or ambulatory blood pressure monitoring encouraged Self-Care Activities: - Self administers medications as prescribed Attends all scheduled provider appointments Calls provider office for new concerns, questions, or BP outside discussed parameters Checks BP and records as discussed Follows a low sodium diet/DASH diet Follow Up Plan: Telephone follow up appointment with care management team member scheduled for: 4-14-202 at 0900 am   Task: RNCM: Identify and Monitor Blood Pressure Elevation   Note:   Care Management Activities:    - blood pressure trends reviewed - depression screen reviewed - home or ambulatory blood pressure monitoring encouraged  Care Plan : RNCM: HLD management  Updates made by Vanita Ingles since 12/20/2020 12:00 AM    Problem: RNCM: HLD Management   Priority: Medium    Long-Range Goal: RNCM: HLD  Management   Priority: Medium  Note:   Current Barriers:  . Poorly controlled hyperlipidemia, complicated by elevated blood pressures . Current antihyperlipidemic regimen: no medications, diet controlled  . Most recent lipid panel:     Component Value Date/Time   CHOL 138 10/02/2020 0759   TRIG 86 10/02/2020 0759   HDL 47 10/02/2020 0759   CHOLHDL 2.9 10/02/2020 0759   VLDL 28 09/09/2016 0001   LDLCALC 74 10/02/2020 0759 .   Marland Kitchen ASCVD risk enhancing conditions: age >29, HTN  . Lacks social connections . Does not contact provider office for questions/concerns  RN Care Manager Clinical Goal(s):  Marland Kitchen Over the next 120 days, patient will work with Consulting civil engineer, providers, and care team towards execution of optimized self-health management plan . patient will verbalize understanding of plan for HLD  . patient will work with [facility] and pcp  to address needs related to HLD . patient will attend all scheduled medical appointments: 04-16-2021 at 0800 am . patient will work with care guides  (community agency) to assist with meal delivery such as MOW or [employer]  Interventions: . Collaboration with [name], DO regarding development and update of comprehensive plan of care as evidenced by provider attestation and co-signature . Inter-disciplinary care team collaboration (see longitudinal plan of care) . Medication review performed; medication list updated in electronic medical record.  Bertram Savin care team collaboration (see longitudinal plan of care) . Referred to pharmacy team for assistance with HLD medication management . Evaluation of current treatment plan related to HLD and patient's adherence to plan as established by provider. . Advised patient to call the office for changes or questions  . Provided education to patient re: heart healthy diet, exercise when it is nice outside, taking medications as prescribed  . Reviewed scheduled/upcoming  provider appointments including: 04-16-2021 0800 am . Work with care guides to assist with meal delivery options: MOW or [employer]  . Discussed plans with patient for ongoing care management follow up and provided patient with direct contact information for care management team   Patient Goals/Self-Care Activities: . Over the next 120 days, patient will:   - call for medicine refill 2 or 3 days before it runs out - call if I am sick and can't take my medicine - keep a list of all the medicines I take; vitamins and herbals too - learn to read medicine labels - use a pillbox to sort medicine - use an alarm clock or phone to remind me to take my medicine - change to whole grain breads, cereal, pasta - drink 6 to 8 glasses of water each day - eat 3 to 5 servings of fruits and vegetables each day - eat 5 or 6 small meals each day - fill half the plate with nonstarchy vegetables - limit fast food meals to no more than 1 per week - manage portion size - prepare main meal at home 3 to 5 days each week - read food labels for fat, fiber, carbohydrates and portion size - be open to making changes - I can manage, know and watch for signs of a heart attack - if I have chest pain, call for help - learn about small changes that will make a big difference -  learn my personal risk factors  - barriers to meeting goals identified - change-talk evoked - choices provided - collaboration with team encouraged - decision-making supported - health risks reviewed - problem-solving facilitated - questions answered - readiness for change evaluated - reassurance provided - resources needed to meet goals identified - self-reflection promoted - self-reliance encouraged  Follow Up Plan: Telephone follow up appointment with care management team member scheduled for:  02-07-2021 at 0900 am   Task: RNCM: HLD Management   Note:   Care Management Activities:    - barriers to meeting goals  identified - change-talk evoked - choices provided - collaboration with team encouraged - decision-making supported - health risks reviewed - problem-solving facilitated - questions answered - readiness for change evaluated - reassurance provided - resources needed to meet goals identified - self-reflection promoted - self-reliance encouraged       Care Plan : RNCM: Airway hypersensitivity  Updates made by Vanita Ingles since 12/20/2020 12:00 AM    Problem: RNCM: Airway Hypersensitivity   Priority: High    Long-Range Goal: RNCM: Shortness of Breath/ Airway Hypersensitivity Disease Progression Minimized or Managed   Priority: High  Note:   Current Barriers:  Marland Kitchen Knowledge deficits related to basic understanding of shortness of breath/airway hypersensitivity disease process . Knowledge deficits related to basic shortness of breath self care/management . Knowledge deficit related to importance of energy conservation . Limited Social Support . Unable to independently manage new onset of shortness of breath and airway hypersensitivity  . Lacks social connections . Does not contact provider office for questions/concerns  Case Manager Clinical Goal(s):  Over the next 120 days patient will report utilizing pursed lip breathing for shortness of breath  Over the next 120 days, patient will be able to verbalize understanding of Shortness of breath action plan and when to seek appropriate levels of medical care  Over the next 120 days, patient will engage in lite exercise as tolerated to build/regain stamina and strength and reduce shortness of breath through activity tolerance  Over the next 120 days, patient will verbalize basic understanding of Shortness of breath and airway hypersensitivity  disease process and self care activities  Interventions:  . Collaboration with [name], DO regarding development and update of comprehensive plan of care as evidenced by provider  attestation and co-signature . Inter-disciplinary care team collaboration (see longitudinal plan of care)  UNABLE to independently: manage new onset of shortness of breath and airway hypersensitivity   Provided patient with basic written and verbal Shortness of Breath education on self care/management/and exacerbation prevention   Provided patient with shortness of breath  action plan and reinforced importance of daily self assessment  Provided written and verbal instructions on pursed lip breathing and utilized returned demonstration as teach back  Provided instruction about proper use of medications used for management of Shortness of breath including inhalers  Advised patient to self assesses Shortness of breath  action plan zone and make appointment with provider if in the yellow zone for 48 hours without improvement.  Provided patient with education about the role of exercise in the management of Shortness of breath and airway hypersensitivity   Advised patient to engage in light exercise as tolerated 3-5 days a week  Provided education about and advised patient to utilize infection prevention strategies to reduce risk of respiratory infection  Patient Goals/Self-Care Activities:  . - activity or exercise based on tolerance encouraged . - barriers to treatment managed . - breathing techniques  encouraged . - communicable disease prevention promoted . - individualized medical nutrition therapy provided . - medication-adherence assessment completed . - quality of sleep assessed . - rescue (action) plan reviewed . - screen for functional limitations completed and reviewed . - self-awareness of symptom triggers encouraged- the patient had some plug ins he was using in his home and this was causing worsening symptoms. He has removed them from his home. He is also using a humidifier and an air purifier to help with air quality in his home.  Follow Up Plan: Telephone follow up appointment  with care management team member scheduled for: 02-11-2021 at 0900 am   Task: RNCM: Management of shortness of breath   Note:   Care Management Activities:    - activity or exercise based on tolerance encouraged - barriers to treatment managed - breathing techniques encouraged - communicable disease prevention promoted - individualized medical nutrition therapy provided - medication-adherence assessment completed - quality of sleep assessed - rescue (action) plan reviewed - screen for functional limitations completed and reviewed - self-awareness of symptom triggers encouraged         Plan:Telephone follow up appointment with care management team member scheduled for:  02-07-2021 at 0900 am  [place], MSN, [place] Mobile: [phone]

## 2020-12-25 ENCOUNTER — Telehealth: Payer: Self-pay

## 2020-12-25 NOTE — Telephone Encounter (Signed)
   Telephone encounter was:  Successful.  12/25/2020 Name: Zachary Brewer MRN: [id number] DOB: Mar 14, 1941  [name] is a 80 y.o. year old male who is a primary care patient of [name], DO . The community resource team was consulted for assistance with [place] guide performed the following interventions: Patient provided with information about care guide support team and interviewed to confirm resource needs Obtained verbal consent to place patient referral to [facility] on [employer] with patient's daughter Gypsy Balsam she is ok with patient receiving Meals on Wheels. Will place referral..  Follow Up Plan:  Care guide will follow up with patient by phone over the next 7 days  Michelena Culmer, AAS Paralegal, [place] . Embedded Care Coordination [facility] Health  Care Management  [address], [place] [address] ??[email or web address]  ?? [phone]   [email or web address]

## 2020-12-26 ENCOUNTER — Telehealth: Payer: Self-pay

## 2020-12-26 NOTE — Telephone Encounter (Signed)
   Telephone encounter was:  Successful.  12/26/2020 Name: Zachary Brewer MRN: [id number] DOB: May 18, 1941  Zachary Brewer is a 80 y.o. year old male who is a primary care patient of [name], DO . The community resource team was consulted for assistance with [place] guide performed the following interventions: Follow up call placed to community resources to determine status of patients referral Follow up call placed to the patient to discuss status of referral Spoke with patient's daughter Zachary Brewer to confirm that Pamala Hurry at [facility] had called her and meals will start for her father tomorrow 12/27/20.  Follow Up Plan:  No further follow up planned at this time. The patient has been provided with needed resources.   Kanda Deluna, AAS Paralegal, [place] . Embedded Care Coordination [facility] Health  Care Management  [address], [place] [address] ??[email or web address]  ?? [phone]   [email or web address]

## 2021-01-07 ENCOUNTER — Other Ambulatory Visit: Payer: Self-pay | Admitting: Family Medicine

## 2021-01-07 DIAGNOSIS — K2100 Gastro-esophageal reflux disease with esophagitis, without bleeding: Secondary | ICD-10-CM

## 2021-01-07 DIAGNOSIS — I10 Essential (primary) hypertension: Secondary | ICD-10-CM

## 2021-01-07 DIAGNOSIS — N138 Other obstructive and reflux uropathy: Secondary | ICD-10-CM

## 2021-01-22 DIAGNOSIS — M1180 Other specified crystal arthropathies, unspecified site: Secondary | ICD-10-CM | POA: Diagnosis not present

## 2021-01-22 DIAGNOSIS — M8949 Other hypertrophic osteoarthropathy, multiple sites: Secondary | ICD-10-CM | POA: Diagnosis not present

## 2021-02-05 ENCOUNTER — Ambulatory Visit (INDEPENDENT_AMBULATORY_CARE_PROVIDER_SITE_OTHER): Payer: Medicare Other

## 2021-02-05 VITALS — Ht 67.0 in | Wt 144.0 lb

## 2021-02-05 DIAGNOSIS — Z0000 Encounter for general adult medical examination without abnormal findings: Secondary | ICD-10-CM | POA: Diagnosis not present

## 2021-02-05 NOTE — Progress Notes (Signed)
I connected with Kandee Keen today by telephone and verified that I am speaking with the correct person using two identifiers. Location patient: home Location provider: work Persons participating in the virtual visit: Larry Sierras LPN.   I discussed the limitations, risks, security and privacy concerns of performing an evaluation and management service by telephone and the availability of in person appointments. I also discussed with the patient that there may be a patient responsible charge related to this service. The patient expressed understanding and verbally consented to this telephonic visit.    Interactive audio and video telecommunications were attempted between this provider and patient, however failed, due to patient having technical difficulties OR patient did not have access to video capability.  We continued and completed visit with audio only.     Vital signs may be patient reported or missing.  Subjective:   Zachary Brewer is a 80 y.o. male who presents for Medicare Annual/Subsequent preventive examination.  Review of Systems     Cardiac Risk Factors include: advanced age (>29mn, >>27women);hypertension;male gender;sedentary lifestyle     Objective:    Today's Vitals   02/05/21 1137  Weight: 144 lb (65.3 kg)  Height: '5\' 7"'  (1.702 m)   Body mass index is 22.55 kg/m.  Advanced Directives 02/05/2021 07/12/2019 05/18/2018 03/31/2017 07/09/2016 07/02/2016 06/11/2016  Does Patient Have a Medical Advance Directive? Yes Yes Yes Yes Yes Yes Yes  Type of AParamedicof [place] will Living will;Healthcare Power of [place] will - Healthcare Power of [place] will [place] will  Does patient want to make changes to medical advance directive? - - - Yes (MAU/Ambulatory/Procedural Areas - Information given) - - -  Copy of H[place] Chart? No - copy requested No - copy requested No - copy requested - No - copy requested - No - copy requested  Would patient like information on creating a medical advance directive? - - - - - - -    Current Medications (verified) Outpatient Encounter Medications as of 02/05/2021  Medication Sig  . ACCU-CHEK AVIVA PLUS test strip USE TO CHECK BLOOD SUGAR ONCE A DAY  . Accu-Chek FastClix Lancets MISC Use to check blood sugar up to 1 x weekly  . aspirin EC 81 MG tablet Take 81 mg by mouth daily.  . Blood Glucose Monitoring Suppl (ACCU-CHEK AVIVA PLUS) w/Device KIT Use glucometer to check blood sugar 1 x weekly as advised  . Cholecalciferol (VITAMIN D3) 1000 units CAPS Take 1,000 Units by mouth daily.  . enalapril (VASOTEC) 20 MG tablet Take 1 tablet (20 mg total) by mouth 2 (two) times daily.  . finasteride (PROSCAR) 5 MG tablet TAKE ONE TABLET BY MOUTH ONCE DAILY  . Glucosamine-Chondroit-Vit C-Mn (GLUCOSAMINE 1500 COMPLEX PO) Take 1,500 mg by mouth daily.  . Multiple Vitamin (MULTIVITAMIN) tablet Take 1 tablet by mouth daily. Reported on 11/21/2015  . omeprazole (PRILOSEC) 20 MG capsule TAKE ONE CAPSULE BY MOUTH TWICE DAILY BEFORE A MEAL  . psyllium (METAMUCIL) 58.6 % powder Take 1 packet by mouth daily.  . tamsulosin (FLOMAX) 0.4 MG CAPS capsule Take 2 capsules (0.8 mg total) by mouth daily after breakfast.  . Calcium Carb-Cholecalciferol (CALCIUM-VITAMIN D) 500-400 MG-UNIT TABS TAKE ONE TABLET BY MOUTH TWICE DAILY WITH MEALS (Patient not taking: Reported on 02/05/2021)   No facility-administered encounter medications on file as of 02/05/2021.    Allergies (verified) Patient has  no known allergies.   History: Past Medical History:  Diagnosis Date  . Allergy   . Arthritis 05/30/2015   Gout - Left knee and ankle  . Cervical pain 05/30/2015  . GERD (gastroesophageal reflux disease)   . Hyperlipidemia   . Hypertension   . 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 Wears dentures    full upper  and lower (doesn't wear lower)  . Wears hearing aid    bilateral   Past Surgical History:  Procedure Laterality Date  . CATARACT EXTRACTION W/PHACO Right 06/11/2016   Procedure: CATARACT EXTRACTION PHACO AND INTRAOCULAR LENS PLACEMENT ([place]);  Surgeon: Leandrew Koyanagi, MD;  Location: [place];  Service: Ophthalmology;  Laterality: Right;  . CATARACT EXTRACTION W/PHACO Left 07/09/2016   Procedure: CATARACT EXTRACTION PHACO AND INTRAOCULAR LENS PLACEMENT ([facility]);  Surgeon: Leandrew Koyanagi, MD;  Location: [place];  Service: Ophthalmology;  Laterality: Left;  MALYUGIN  . ESOPHAGOGASTRODUODENOSCOPY (EGD) WITH PROPOFOL N/A 06/20/2015   Procedure: ESOPHAGOGASTRODUODENOSCOPY (EGD) WITH PROPOFOL with dialation;  Surgeon: Lucilla Lame, MD;  Location: [place];  Service: Endoscopy;  Laterality: N/A;  . FLEXIBLE BRONCHOSCOPY N/A 06/22/2015   Procedure: FLEXIBLE BRONCHOSCOPY;  Surgeon: Allyne Gee, MD;  Location: [facility];  Service: Pulmonary;  Laterality: N/A;  . GASTRIC OUTLET OBSTRUCTION RELEASE  2013  . 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  . UPPER GI ENDOSCOPY     Family History  Problem Relation Age of Onset  . Diabetes Mother   . Alzheimer's disease Mother   . Heart disease Father   . Diabetes Sister   . Diabetes Brother   . Stroke Brother   . Mental illness Brother   . Hypertension Paternal Grandfather    Social History   Socioeconomic History  . Marital status: Widowed    Spouse name: Not on file  . Number of children: Not on file  . Years of education: Not on file  . Highest education level: 6th grade  Occupational History  . Occupation: retired  Tobacco Use  . Smoking status: Never Smoker  . Smokeless tobacco: Never Used  Vaping Use  . Vaping Use: Never used  Substance and Sexual Activity  . Alcohol use: No  . Drug use: No  . Sexual activity: Not on file  Other Topics Concern  . Not on file  Social History Narrative  . Not on file    Social Determinants of Health   Financial Resource Strain: Low Risk   . Difficulty of Paying Living Expenses: Not hard at all  Food Insecurity: No Food Insecurity  . Worried About Charity fundraiser in the Last Year: Never true  . Ran Out of Food in the Last Year: Never true  Transportation Needs: No Transportation Needs  . Lack of Transportation (Medical): No  . Lack of Transportation (Non-Medical): No  Physical Activity: Inactive  . Days of Exercise per Week: 0 days  . Minutes of Exercise per Session: 0 min  Stress: No Stress Concern Present  . Feeling of Stress : Not at all  Social Connections: Moderately Isolated  . Frequency of Communication with Friends and Family: More than three times a week  . Frequency of Social Gatherings with Friends and Family: More than three times a week  . Attends Religious Services: More than 4 times per year  . Active Member of Clubs or Organizations: No  . Attends Archivist Meetings: Never  . Marital Status: Widowed    Tobacco Counseling Counseling given: Not Answered  Clinical Intake:  Pre-visit preparation completed: Yes  Pain : No/denies pain     Nutritional Status: BMI of 19-24  Normal Nutritional Risks: None Diabetes: No  How often do you need to have someone help you when you read instructions, pamphlets, or other written materials from your doctor or pharmacy?: 1 - Never What is the last grade level you completed in school?: 7th grade  Diabetic? no  Interpreter Needed?: No  Information entered by :: NAllen LPN   Activities of Daily Living In your present state of health, do you have any difficulty performing the following activities: 02/05/2021 10/09/2020  Hearing? Y Y  Comment trouble understanding words -  Vision? N N  Difficulty concentrating or making decisions? N N  Walking or climbing stairs? N Y  Dressing or bathing? N N  Doing errands, shopping? N N  Preparing Food and eating ? N -  Using  the Toilet? N -  In the past six months, have you accidently leaked urine? Y -  Do you have problems with loss of bowel control? N -  Managing your Medications? N -  Managing your Finances? N -  Housekeeping or managing your Housekeeping? N -  Some recent data might be hidden    Patient Care Team: [name], DO as PCP - General (Family Medicine) Edrick Kins, MD as Rounding Team (Internal Medicine) Vanita Ingles, RN as Case Manager (General Practice)  Indicate any recent Medical Services you may have received from other than [facility] providers in the past year (date may be approximate).     Assessment:   This is a routine wellness examination for [place].  Hearing/Vision screen  Hearing Screening   '125Hz'  '250Hz'  '500Hz'  '1000Hz'  '2000Hz'  '3000Hz'  '4000Hz'  '6000Hz'  '8000Hz'   Right ear:           Left ear:           Vision Screening Comments: Regular eye exams, [facility]  Dietary issues and exercise activities discussed: Current Exercise Habits: The patient does not participate in regular exercise at present  Goals    . DIET - INCREASE WATER INTAKE     Recommend drinking at leas 6-8 glasses of water a day     . Increase water intake     Recommend drinking at least 4-5 glasses of water a day    . Patient Stated     02/05/2021, increase water intake      Depression Screen PHQ 2/9 Scores 02/05/2021 10/09/2020 04/09/2020 03/05/2020 10/06/2019 07/12/2019 10/05/2018  PHQ - 2 Score 0 0 0 0 0 1 0  Exception Documentation - - - - - - -    Fall Risk Fall Risk  02/05/2021 10/09/2020 04/09/2020 10/06/2019 07/12/2019  Falls in the past year? 0 0 0 0 0  Comment - - - - -  Number falls in past yr: - 0 0 0 -  Injury with Fall? - 0 0 - -  Risk for fall due to : Medication side effect - - - -  Follow up Falls evaluation completed;Education provided;Falls prevention discussed Falls evaluation completed Falls evaluation completed Falls evaluation completed -    FALL RISK  PREVENTION PERTAINING TO THE HOME:  Any stairs in or around the home? Yes  If so, are there any without handrails? No  Home free of loose throw rugs in walkways, pet beds, electrical cords, etc? Yes  Adequate lighting in your home to reduce risk of falls? Yes   ASSISTIVE DEVICES UTILIZED  TO PREVENT FALLS:  Life alert? No  Use of a cane, walker or w/c? Yes  Grab bars in the bathroom? Yes  Shower chair or bench in shower? No  Elevated toilet seat or a handicapped toilet? No   TIMED UP AND GO:  Was the test performed? No .     Cognitive Function:     6CIT Screen 02/05/2021 07/12/2019 05/18/2018 03/31/2017  What Year? 0 points 0 points 0 points 0 points  What month? 0 points 0 points 0 points 0 points  What time? 0 points 0 points 0 points 0 points  Count back from 20 0 points 0 points 0 points 0 points  Months in reverse 4 points 0 points 0 points 0 points  Repeat phrase 10 points 4 points 2 points 0 points  Total Score '14 4 2 ' 0    Immunizations Immunization History  Administered Date(s) Administered  . Fluad Quad(high Dose 65+) 07/12/2019  . Influenza Inj Mdck Quad With Preservative 08/26/2017  . Influenza, High Dose Seasonal PF 07/24/2015, 09/09/2016  . Influenza-Unspecified 08/26/2017, 08/06/2018, 06/27/2020  . Moderna Sars-Covid-2 Vaccination 11/08/2019, 12/06/2019, 08/24/2020  . PPD Test 08/03/2015  . Pneumococcal Conjugate-13 07/28/2014    TDAP status: Up to date  Flu Vaccine status: Up to date  Pneumococcal vaccine status: Up to date  Covid-19 vaccine status: Completed vaccines  Qualifies for Shingles Vaccine? Yes   Zostavax completed No   Shingrix Completed?: No.    Education has been provided regarding the importance of this vaccine. Patient has been advised to call insurance company to determine out of pocket expense if they have not yet received this vaccine. Advised may also receive vaccine at local pharmacy or Health Dept. Verbalized acceptance and  understanding.  Screening Tests Health Maintenance  Topic Date Due  . INFLUENZA VACCINE  05/27/2021  . TETANUS/TDAP  10/27/2022  . COVID-19 Vaccine  Completed  . PNA vac Low Risk Adult  Completed  . HPV VACCINES  Aged Out    Health Maintenance  There are no preventive care reminders to display for this patient.  Colorectal cancer screening: No longer required.   Lung Cancer Screening: (Low Dose CT Chest recommended if Age 51-80 years, 30 pack-year currently smoking OR have quit w/in 15years.) does not qualify.   Lung Cancer Screening Referral: no  Additional Screening:  Hepatitis C Screening: does not qualify;   Vision Screening: Recommended annual ophthalmology exams for early detection of glaucoma and other disorders of the eye. Is the patient up to date with their annual eye exam?  Yes  Who is the provider or what is the name of the office in which the patient attends annual eye exams? [facility] If pt is not established with a provider, would they like to be referred to a provider to establish care? No .   Dental Screening: Recommended annual dental exams for proper oral hygiene  Community Resource Referral / Chronic Care Management: CRR required this visit?  No   CCM required this visit?  No      Plan:     I have personally reviewed and noted the following in the patient's chart:   . Medical and social history . Use of alcohol, tobacco or illicit drugs  . Current medications and supplements . Functional ability and status . Nutritional status . Physical activity . Advanced directives . List of other physicians . Hospitalizations, surgeries, and ER visits in previous 12 months . Vitals . Screenings to include cognitive, depression,  and falls . Referrals and appointments  In addition, I have reviewed and discussed with patient certain preventive protocols, quality metrics, and best practice recommendations. A written personalized care plan for  preventive services as well as general preventive health recommendations were provided to patient.     Kellie Simmering, LPN   [id number]   Nurse Notes:

## 2021-02-05 NOTE — Patient Instructions (Signed)
Zachary Brewer , Thank you for taking time to come for your Medicare Wellness Visit. I appreciate your ongoing commitment to your health goals. Please review the following plan we discussed and let me know if I can assist you in the future.   Screening recommendations/referrals: Colonoscopy: not required Recommended yearly ophthalmology/optometry visit for glaucoma screening and checkup Recommended yearly dental visit for hygiene and checkup  Vaccinations: Influenza vaccine: completed 06/27/2020, due 05/27/2021 Pneumococcal vaccine: completed 07/28/2014 Tdap vaccine: completed 10/27/2012 Shingles vaccine: discussed   Covid-19:  08/24/2020, 12/06/2019, 11/08/2019  Advanced directives: Please bring a copy of your POA (Power of Attorney) and/or Living Will to your next appointment.   Conditions/risks identified: none  Next appointment: Follow up in one year for your annual wellness visit.   Preventive Care 25 Years and Older, Male Preventive care refers to lifestyle choices and visits with your health care provider that can promote health and wellness. What does preventive care include?  A yearly physical exam. This is also called an annual well check.  Dental exams once or twice a year.  Routine eye exams. Ask your health care provider how often you should have your eyes checked.  Personal lifestyle choices, including:  Daily care of your teeth and gums.  Regular physical activity.  Eating a healthy diet.  Avoiding tobacco and drug use.  Limiting alcohol use.  Practicing safe sex.  Taking low doses of aspirin every day.  Taking vitamin and mineral supplements as recommended by your health care provider. What happens during an annual well check? The services and screenings done by your health care provider during your annual well check will depend on your age, overall health, lifestyle risk factors, and family history of disease. Counseling  Your health care provider may ask you  questions about your:  Alcohol use.  Tobacco use.  Drug use.  Emotional well-being.  Home and relationship well-being.  Sexual activity.  Eating habits.  History of falls.  Memory and ability to understand (cognition).  Work and work Statistician. Screening  You may have the following tests or measurements:  Height, weight, and BMI.  Blood pressure.  Lipid and cholesterol levels. These may be checked every 5 years, or more frequently if you are over 64 years old.  Skin check.  Lung cancer screening. You may have this screening every year starting at age 79 if you have a 30-pack-year history of smoking and currently smoke or have quit within the past 15 years.  Fecal occult blood test (FOBT) of the stool. You may have this test every year starting at age 49.  Flexible sigmoidoscopy or colonoscopy. You may have a sigmoidoscopy every 5 years or a colonoscopy every 10 years starting at age 45.  Prostate cancer screening. Recommendations will vary depending on your family history and other risks.  Hepatitis C blood test.  Hepatitis B blood test.  Sexually transmitted disease (STD) testing.  Diabetes screening. This is done by checking your blood sugar (glucose) after you have not eaten for a while (fasting). You may have this done every 1-3 years.  Abdominal aortic aneurysm (AAA) screening. You may need this if you are a current or former smoker.  Osteoporosis. You may be screened starting at age 46 if you are at high risk. Talk with your health care provider about your test results, treatment options, and if necessary, the need for more tests. Vaccines  Your health care provider may recommend certain vaccines, such as:  Influenza vaccine. This is recommended  every year.  Tetanus, diphtheria, and acellular pertussis (Tdap, Td) vaccine. You may need a Td booster every 10 years.  Zoster vaccine. You may need this after age 39.  Pneumococcal 13-valent conjugate  (PCV13) vaccine. One dose is recommended after age 16.  Pneumococcal polysaccharide (PPSV23) vaccine. One dose is recommended after age 70. Talk to your health care provider about which screenings and vaccines you need and how often you need them. This information is not intended to replace advice given to you by your health care provider. Make sure you discuss any questions you have with your health care provider. Document Released: 11/09/2015 Document Revised: 07/02/2016 Document Reviewed: 08/14/2015 Elsevier Interactive Patient Education  2017 [place] Prevention in the Home Falls can cause injuries. They can happen to people of all ages. There are many things you can do to make your home safe and to help prevent falls. What can I do on the outside of my home?  Regularly fix the edges of walkways and driveways and fix any cracks.  Remove anything that might make you trip as you walk through a door, such as a raised step or threshold.  Trim any bushes or trees on the path to your home.  Use bright outdoor lighting.  Clear any walking paths of anything that might make someone trip, such as rocks or tools.  Regularly check to see if handrails are loose or broken. Make sure that both sides of any steps have handrails.  Any raised decks and porches should have guardrails on the edges.  Have any leaves, snow, or ice cleared regularly.  Use sand or salt on walking paths during winter.  Clean up any spills in your garage right away. This includes oil or grease spills. What can I do in the bathroom?  Use night lights.  Install grab bars by the toilet and in the tub and shower. Do not use towel bars as grab bars.  Use non-skid mats or decals in the tub or shower.  If you need to sit down in the shower, use a plastic, non-slip stool.  Keep the floor dry. Clean up any water that spills on the floor as soon as it happens.  Remove soap buildup in the tub or shower  regularly.  Attach bath mats securely with double-sided non-slip rug tape.  Do not have throw rugs and other things on the floor that can make you trip. What can I do in the bedroom?  Use night lights.  Make sure that you have a light by your bed that is easy to reach.  Do not use any sheets or blankets that are too big for your bed. They should not hang down onto the floor.  Have a firm chair that has side arms. You can use this for support while you get dressed.  Do not have throw rugs and other things on the floor that can make you trip. What can I do in the kitchen?  Clean up any spills right away.  Avoid walking on wet floors.  Keep items that you use a lot in easy-to-reach places.  If you need to reach something above you, use a strong step stool that has a grab bar.  Keep electrical cords out of the way.  Do not use floor polish or wax that makes floors slippery. If you must use wax, use non-skid floor wax.  Do not have throw rugs and other things on the floor that can make you trip. What  can I do with my stairs?  Do not leave any items on the stairs.  Make sure that there are handrails on both sides of the stairs and use them. Fix handrails that are broken or loose. Make sure that handrails are as long as the stairways.  Check any carpeting to make sure that it is firmly attached to the stairs. Fix any carpet that is loose or worn.  Avoid having throw rugs at the top or bottom of the stairs. If you do have throw rugs, attach them to the floor with carpet tape.  Make sure that you have a light switch at the top of the stairs and the bottom of the stairs. If you do not have them, ask someone to add them for you. What else can I do to help prevent falls?  Wear shoes that:  Do not have high heels.  Have rubber bottoms.  Are comfortable and fit you well.  Are closed at the toe. Do not wear sandals.  If you use a stepladder:  Make sure that it is fully  opened. Do not climb a closed stepladder.  Make sure that both sides of the stepladder are locked into place.  Ask someone to hold it for you, if possible.  Clearly mark and make sure that you can see:  Any grab bars or handrails.  First and last steps.  Where the edge of each step is.  Use tools that help you move around (mobility aids) if they are needed. These include:  Canes.  Walkers.  Scooters.  Crutches.  Turn on the lights when you go into a dark area. Replace any light bulbs as soon as they burn out.  Set up your furniture so you have a clear path. Avoid moving your furniture around.  If any of your floors are uneven, fix them.  If there are any pets around you, be aware of where they are.  Review your medicines with your doctor. Some medicines can make you feel dizzy. This can increase your chance of falling. Ask your doctor what other things that you can do to help prevent falls. This information is not intended to replace advice given to you by your health care provider. Make sure you discuss any questions you have with your health care provider. Document Released: 08/09/2009 Document Revised: 03/20/2016 Document Reviewed: 11/17/2014 Elsevier Interactive Patient Education  2017 [employer].

## 2021-02-07 ENCOUNTER — Ambulatory Visit (INDEPENDENT_AMBULATORY_CARE_PROVIDER_SITE_OTHER): Payer: Medicare Other | Admitting: General Practice

## 2021-02-07 ENCOUNTER — Telehealth: Payer: Self-pay | Admitting: General Practice

## 2021-02-07 DIAGNOSIS — I10 Essential (primary) hypertension: Secondary | ICD-10-CM

## 2021-02-07 DIAGNOSIS — R0602 Shortness of breath: Secondary | ICD-10-CM

## 2021-02-07 DIAGNOSIS — J45909 Unspecified asthma, uncomplicated: Secondary | ICD-10-CM | POA: Diagnosis not present

## 2021-02-07 DIAGNOSIS — E782 Mixed hyperlipidemia: Secondary | ICD-10-CM

## 2021-02-07 DIAGNOSIS — G894 Chronic pain syndrome: Secondary | ICD-10-CM

## 2021-02-07 NOTE — Chronic Care Management (AMB) (Signed)
Chronic Care Management   CCM RN Visit Note  02/07/2021 Name: Zachary Brewer MRN: [id number] DOB: 05/14/1941  Subjective: Zachary Brewer is a 80 y.o. year old male who is a primary care patient of [name], DO. The care management team was consulted for assistance with disease management and care coordination needs.    Engaged with patient by telephone for follow up visit in response to provider referral for case management and/or care coordination services.   Consent to Services:  The patient was given information about Chronic Care Management services, agreed to services, and gave verbal consent prior to initiation of services.  Please see initial visit note for detailed documentation.   Patient agreed to services and verbal consent obtained.   Assessment: Review of patient past medical history, allergies, medications, health status, including review of consultants reports, laboratory and other test data, was performed as part of comprehensive evaluation and provision of chronic care management services.   SDOH (Social Determinants of Health) assessments and interventions performed:    CCM Care Plan  No Known Allergies  Outpatient Encounter Medications as of 02/07/2021  Medication Sig  . ACCU-CHEK AVIVA PLUS test strip USE TO CHECK BLOOD SUGAR ONCE A DAY  . Accu-Chek FastClix Lancets MISC Use to check blood sugar up to 1 x weekly  . aspirin EC 81 MG tablet Take 81 mg by mouth daily.  . Blood Glucose Monitoring Suppl (ACCU-CHEK AVIVA PLUS) w/Device KIT Use glucometer to check blood sugar 1 x weekly as advised  . Calcium Carb-Cholecalciferol (CALCIUM-VITAMIN D) 500-400 MG-UNIT TABS TAKE ONE TABLET BY MOUTH TWICE DAILY WITH MEALS (Patient not taking: Reported on 02/05/2021)  . Cholecalciferol (VITAMIN D3) 1000 units CAPS Take 1,000 Units by mouth daily.  . enalapril (VASOTEC) 20 MG tablet Take 1 tablet (20 mg total) by mouth 2 (two) times daily.  . finasteride (PROSCAR) 5  MG tablet TAKE ONE TABLET BY MOUTH ONCE DAILY  . Glucosamine-Chondroit-Vit C-Mn (GLUCOSAMINE 1500 COMPLEX PO) Take 1,500 mg by mouth daily.  . Multiple Vitamin (MULTIVITAMIN) tablet Take 1 tablet by mouth daily. Reported on 11/21/2015  . omeprazole (PRILOSEC) 20 MG capsule TAKE ONE CAPSULE BY MOUTH TWICE DAILY BEFORE A MEAL  . psyllium (METAMUCIL) 58.6 % powder Take 1 packet by mouth daily.  . tamsulosin (FLOMAX) 0.4 MG CAPS capsule Take 2 capsules (0.8 mg total) by mouth daily after breakfast.   No facility-administered encounter medications on file as of 02/07/2021.    Patient Active Problem List   Diagnosis Date Noted  . Calculus of gallbladder without cholecystitis without obstruction 07/06/2018  . Carpal tunnel syndrome on right 04/02/2018  . Encounter for long-term (current) use of high-risk medication 01/22/2017  . Vitamin D deficiency 10/24/2015  . Calcium pyrophosphate arthropathy of multiple sites 10/03/2015  . Chronic fatigue, unspecified 10/03/2015  . Chronic pain of left wrist 10/03/2015  . Chronic pain syndrome 09/24/2015  . Pseudogout 07/31/2015  . Hyperlipidemia 07/24/2015  . BPH with obstruction/lower urinary tract symptoms 07/24/2015  . Gastric outlet obstruction   . Swallowing difficulty   . Allergic rhinitis 05/30/2015  . 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/30/2015  . 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  . Bradycardia 05/30/2015  . Essential (primary) hypertension 05/30/2015  . Esophagitis, reflux 05/30/2015  . 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 Blood glucose elevated 05/30/2015  . Lump in scrotum 05/30/2015    Conditions to be addressed/monitored:HTN, HLD, Pulmonary Disease and chronic pain to right knee  Care Plan : RNCM: Hypertension (Adult)  Updates made by Vanita Ingles since 02/07/2021 12:00 AM    Problem: RNCM: Hypertension (Hypertension)   Priority: High    Long-Range Goal: RNCM: Hypertension Monitored   Priority: High  Note:   Objective:  . Last  practice recorded BP readings:  . BP Readings from Last 3 Encounters: .  11/19/20 . (!) 143/63 .  10/09/20 . 114/60 .  08/23/20 . (!) 142/69 .    Marland Kitchen Most recent eGFR/CrCl: No results found for: EGFR  No components found for: CRCL Current Barriers:  Marland Kitchen Knowledge Deficits related to basic understanding of hypertension pathophysiology and self care management . Knowledge Deficits related to understanding of medications prescribed for management of hypertension . Limited Social Support . Unable to independently manage HTN . Lacks social connections . Does not contact provider office for questions/concerns Case Manager Clinical Goal(s):  Marland Kitchen Over the next 120 days, patient will verbalize understanding of plan for hypertension management . Over the next 120 days, patient will attend all scheduled medical appointments: 04-16-2021 at 0800 am . Over the next 120 days, patient will demonstrate improved adherence to prescribed treatment plan for hypertension as evidenced by taking all medications as prescribed, monitoring and recording blood pressure as directed, adhering to low sodium/DASH diet . Over the next 120 days, patient will demonstrate improved health management independence as evidenced by checking blood pressure as directed and notifying PCP if SBP>160 or DBP > 90, taking all medications as prescribe, and adhering to a low sodium diet as discussed. . Over the next 120 days, patient will verbalize basic understanding of hypertension disease process and self health management plan as evidenced by compliance to medications, compliance with heart healthy diet and working with the CCM team to optimize health and well being.  Interventions:  . Collaboration with [name], DO regarding development and update of comprehensive plan of care as evidenced by provider attestation and co-signature . Inter-disciplinary care team collaboration (see longitudinal plan of care) . Evaluation of  current treatment plan related to hypertension self management and patient's adherence to plan as established by provider. 02-07-2021: The patient verbalized he was having blood pressures "up and down" . Says the blood pressure medications is causing him to have dizziness at times. Wants to talk to pcp about medications that will help with him not being dizzy. Education and support given.  . Provided education to patient re: stroke prevention, s/s of heart attack and stroke, DASH diet, complications of uncontrolled blood pressure. 02-07-2021: Discussed safety and changing position slowly as the patient may be experiencing orthostatic hypotension. The patient denies falls and states he uses his "walking stick". . Reviewed medications with patient and discussed importance of compliance. The patient is taking medication Enalapril BID. Review of blood pressure readings. 02-07-2021: The patient is compliant with medications.  . Discussed plans with patient for ongoing care management follow up and provided patient with direct contact information for care management team . Advised patient, providing education and rationale, to monitor blood pressure daily and record, calling PCP for findings outside established parameters. The patient is having readings: systolic of 106-269 and diastolic 48-546. Education and support given to monitor for changes such as headache when readings are greater than 270 systolic and > 90 diastolic. The patient is recording numbers to bring to next visit. 02-07-2021: The patient did not provide readings today as he was waiting on meals on wheels, but states that his pressures are up and down. Denies any acute findings.  Marland Kitchen  Reviewed scheduled/upcoming provider appointments including: 04-16-2021 at 0800 am . Collaboration with the pcp and pharm D concerning the patient stating he has noticed more dizziness with his blood pressure medications. Wants to talk to pcp at next appointment.  Patient  Goals: - blood pressure trends reviewed - depression screen reviewed - home or ambulatory blood pressure monitoring encouraged Self-Care Activities: - Self administers medications as prescribed Attends all scheduled provider appointments Calls provider office for new concerns, questions, or BP outside discussed parameters Checks BP and records as discussed Follows a low sodium diet/DASH diet Follow Up Plan: Telephone follow up appointment with care management team member scheduled for: 04-18-2021 at [place] am   Care Plan : RNCM: HLD management  Updates made by Vanita Ingles since 02/07/2021 12:00 AM    Problem: RNCM: HLD Management   Priority: Medium    Long-Range Goal: RNCM: HLD Management   Priority: Medium  Note:   Current Barriers:  . Poorly controlled hyperlipidemia, complicated by elevated blood pressures . Current antihyperlipidemic regimen: no medications, diet controlled  . Most recent lipid panel:     Component Value Date/Time   CHOL 138 10/02/2020 0759   TRIG 86 10/02/2020 0759   HDL 47 10/02/2020 0759   CHOLHDL 2.9 10/02/2020 0759   VLDL 28 09/09/2016 0001   LDLCALC 74 10/02/2020 0759 .   Marland Kitchen ASCVD risk enhancing conditions: age >27, HTN  . Lacks social connections . Does not contact provider office for questions/concerns  RN Care Manager Clinical Goal(s):  Marland Kitchen Over the next 120 days, patient will work with Consulting civil engineer, providers, and care team towards execution of optimized self-health management plan . patient will verbalize understanding of plan for HLD  . patient will work with [facility] and pcp  to address needs related to HLD . patient will attend all scheduled medical appointments: 04-16-2021 at 0800 am . patient will work with care guides  (community agency) to assist with meal delivery such as MOW or [employer]  Interventions: . Collaboration with [name], DO regarding development and update of comprehensive plan of care as  evidenced by provider attestation and co-signature . Inter-disciplinary care team collaboration (see longitudinal plan of care) . Medication review performed; medication list updated in electronic medical record.  Bertram Savin care team collaboration (see longitudinal plan of care) . Referred to pharmacy team for assistance with HLD medication management . Evaluation of current treatment plan related to HLD and patient's adherence to plan as established by provider. 02-07-2021: The patient is compliant with medications and heart healthy diet. Denies any new concerns with HLD management.  . Advised patient to call the office for changes or questions  . Provided education to patient re: heart healthy diet, exercise when it is nice outside, taking medications as prescribed  . Reviewed scheduled/upcoming provider appointments including: 04-16-2021 0800 am . Work with care guides to assist with meal delivery options: MOW or [employer]. 02-07-2021: The patient is now receiving meals of wheels was expecting a delivery at the time of the call.  . Discussed plans with patient for ongoing care management follow up and provided patient with direct contact information for care management team   Patient Goals/Self-Care Activities: . Over the next 120 days, patient will:   - call for medicine refill 2 or 3 days before it runs out - call if I am sick and can't take my medicine - keep a list of all the medicines I take; vitamins and  herbals too - learn to read medicine labels - use a pillbox to sort medicine - use an alarm clock or phone to remind me to take my medicine - change to whole grain breads, cereal, pasta - drink 6 to 8 glasses of water each day - eat 3 to 5 servings of fruits and vegetables each day - eat 5 or 6 small meals each day - fill half the plate with nonstarchy vegetables - limit fast food meals to no more than 1 per week - manage portion size - prepare main meal at  home 3 to 5 days each week - read food labels for fat, fiber, carbohydrates and portion size - be open to making changes - I can manage, know and watch for signs of a heart attack - if I have chest pain, call for help - learn about small changes that will make a big difference - learn my personal risk factors  - barriers to meeting goals identified - change-talk evoked - choices provided - collaboration with team encouraged - decision-making supported - health risks reviewed - problem-solving facilitated - questions answered - readiness for change evaluated - reassurance provided - resources needed to meet goals identified - self-reflection promoted - self-reliance encouraged  Follow Up Plan: Telephone follow up appointment with care management team member scheduled for:  04-18-2021 at 0945 am   Care Plan : RNCM: Airway hypersensitivity  Updates made by Vanita Ingles since 02/07/2021 12:00 AM    Problem: RNCM: Airway Hypersensitivity   Priority: High    Long-Range Goal: RNCM: Shortness of Breath/ Airway Hypersensitivity Disease Progression Minimized or Managed   Priority: High  Note:   Current Barriers:  Marland Kitchen Knowledge deficits related to basic understanding of shortness of breath/airway hypersensitivity disease process . Knowledge deficits related to basic shortness of breath self care/management . Knowledge deficit related to importance of energy conservation . Limited Social Support . Unable to independently manage new onset of shortness of breath and airway hypersensitivity  . Lacks social connections . Does not contact provider office for questions/concerns  Case Manager Clinical Goal(s):  Over the next 120 days patient will report utilizing pursed lip breathing for shortness of breath  Over the next 120 days, patient will be able to verbalize understanding of Shortness of breath action plan and when to seek appropriate levels of medical care  Over the next 120 days,  patient will engage in lite exercise as tolerated to build/regain stamina and strength and reduce shortness of breath through activity tolerance  Over the next 120 days, patient will verbalize basic understanding of Shortness of breath and airway hypersensitivity  disease process and self care activities  Interventions:  . Collaboration with [name], DO regarding development and update of comprehensive plan of care as evidenced by provider attestation and co-signature . Inter-disciplinary care team collaboration (see longitudinal plan of care)  UNABLE to independently: manage new onset of shortness of breath and airway hypersensitivity   Provided patient with basic written and verbal Shortness of Breath education on self care/management/and exacerbation prevention   Provided patient with shortness of breath  action plan and reinforced importance of daily self assessment  Provided written and verbal instructions on pursed lip breathing and utilized returned demonstration as teach back  Provided instruction about proper use of medications used for management of Shortness of breath including inhalers  Advised patient to self assesses Shortness of breath  action plan zone and make appointment with provider if in the yellow  zone for 48 hours without improvement. 02-07-2021: Review of sx and sx and patient is not having any active symptoms. He is using his humidifier and air purifier and this helps with his shortness of breath.   Provided patient with education about the role of exercise in the management of Shortness of breath and airway hypersensitivity   Advised patient to engage in light exercise as tolerated 3-5 days a week  Provided education about and advised patient to utilize infection prevention strategies to reduce risk of respiratory infection  Patient Goals/Self-Care Activities:  . - activity or exercise based on tolerance encouraged . - barriers to treatment  managed . - breathing techniques encouraged . - communicable disease prevention promoted . - individualized medical nutrition therapy provided . - medication-adherence assessment completed . - quality of sleep assessed . - rescue (action) plan reviewed . - screen for functional limitations completed and reviewed . - self-awareness of symptom triggers encouraged- the patient had some plug ins he was using in his home and this was causing worsening symptoms. He has removed them from his home. He is also using a humidifier and an air purifier to help with air quality in his home. 02-07-2021: The patient has given the plug ins to his daughter. Has not had any exacerbations with shortness of breath.  Follow Up Plan: Telephone follow up appointment with care management team member scheduled for: 04-18-2021 at 0945 am   Care Plan : RNCM: Chronic Pain (Adult)- right knee pain  Updates made by Vanita Ingles since 02/07/2021 12:00 AM    Problem: RNCM: Pain Management Plan (Chronic Pain)- right knee pain   Priority: High    Goal: RNCM: Pain Management Plan Developed   Priority: High  Note:   Current Barriers:  Marland Kitchen Knowledge Deficits related to managing acute/chronic pain . Non-adherence to scheduled provider appointments . Non-adherence to prescribed medication regimen . Chronic Disease Management support and education needs related to chronic pain . Unable to independently manage chronic pain to right knee  Nurse Case Manager Clinical Goal(s):  . patient will verbalize understanding of plan for managing pain . patient will attend all scheduled medical appointments: saw specialist on 01-22-2021 for pain in knee, next pcp on 04-16-2021 at 0800 am . patient will demonstrate use of different relaxation  skills and/or diversional activities to assist with pain reduction (distraction, imagery, relaxation, massage, acupressure, TENS, heat, and cold application . patient will report pain at a level less than 3  to 4 on a 10-10 rating scale . patient will use pharmacological and nonpharmacological pain relief strategies . patient will verbalize acceptable level of pain relief and ability to engage in desired activities . patient will engage in desired activities without an increase in pain level Interventions:  . Collaboration with [name], DO regarding development and update of comprehensive plan of care as evidenced by provider attestation and co-signature . Inter-disciplinary care team collaboration (see longitudinal plan of care) . - effectiveness of pharmacologic therapy monitored- patient states that the specialist did not give him pain medications. Is taking over the counter medications.  . - motivation and barriers to change assessed and addressed . - mutually acceptable comfort goal set . - pain assessed . - pain treatment goals reviewed . - premedication prior to activity encouraged . Evaluation of current treatment plan related to chronic right knee pain  and patient's adherence to plan as established by provider. . Advised patient to call the office for changes in level or  intensity of pain  . Provided education to patient re: safety and preventing falls in the home and environment.  . Reviewed medications with patient and discussed compliance . Collaborated with pcp and pharm D regarding ongoing pain in right knee and recommendations for relief of pain to right knee  . Discussed plans with patient for ongoing care management follow up and provided patient with direct contact information for care management team . Allow patient to maintain a diary of pain ratings, timing, precipitating events, medications, treatments, and what works best to relieve pain,  . Refer to support groups and self-help groups . Educate patient about the use of pharmacological interventions for pain management- antianxiety, antidepressants, NSAIDS, opioid analgesics,  . Explain the importance of  lifestyle modifications to effective pain management  Patient Goals/Self Care Activities:  . - mutually acceptable comfort goal set . - pain assessed . - pain management plan developed . - pain treatment goals reviewed . - patient response to treatment assessed . - sharing of pain management plan with teachers and other caregivers encouraged . Self-administers medications as prescribed . Attends all scheduled provider appointments . Calls pharmacy for medication refills . Calls provider office for new concerns or questions Follow Up Plan: Telephone follow up appointment with care management team member scheduled for: 04-18-2021 at 0945 am imeframe:  Short-Term Goal Priority:  High Start Date:        02-07-2021                     Expected End Date:    06-25-2021                   Follow Up Date 04-18-2021   - develop a personal pain management plan - plan exercise or activity when pain is best controlled - prioritize tasks for the day - track times pain is worst and when it is best - track what makes the pain worse and what makes it better - work slower and less intense when having pain  -follow up with specialist on a regular basis    Why is this important?    Day-to-day life can be hard when you have chronic pain.   Pain medicine is just one piece of the treatment puzzle.   You can try these action steps to help you manage your pain.    Notes: The patient is dealing with chronic right knee pain. The patient saw specialist on 01-22-2021. Did not receive medication. Is using "walking stick"      Task: RNCM: Partner to Develop Chronic Pain Management Plan   Note:   Care Management Activities:    - mutually acceptable comfort goal set - pain assessed - pain management plan developed - pain treatment goals reviewed - patient response to treatment assessed - sharing of pain management plan with teachers and other caregivers encouraged        Plan:Telephone follow up  appointment with care management team member scheduled for:  04-18-2021 at [place] am  Noreene Larsson RN, MSN, [place] Mobile: [phone]

## 2021-02-07 NOTE — Patient Instructions (Signed)
Visit Information  PATIENT GOALS: Goals Addressed            This Visit's Progress   . COMPLETED: DIET - INCREASE WATER INTAKE       Recommend drinking at leas 6-8 glasses of water a day     . COMPLETED: Increase water intake       Recommend drinking at least 4-5 glasses of water a day    . RNCM: Manage Chronic Pain right knee       Timeframe:  Short-Term Goal Priority:  High Start Date:        02-07-2021                     Expected End Date:    06-25-2021                   Follow Up Date 04-18-2021   - develop a personal pain management plan - plan exercise or activity when pain is best controlled - prioritize tasks for the day - track times pain is worst and when it is best - track what makes the pain worse and what makes it better - work slower and less intense when having pain  -follow up with specialist on a regular basis    Why is this important?    Day-to-day life can be hard when you have chronic pain.   Pain medicine is just one piece of the treatment puzzle.   You can try these action steps to help you manage your pain.    Notes: The patient is dealing with chronic right knee pain. The patient saw specialist on 01-22-2021. Did not receive medication. Is using "walking stick"      Patient Care Plan: RNCM: Hypertension (Adult)    Problem Identified: RNCM: Hypertension (Hypertension)   Priority: High    Long-Range Goal: RNCM: Hypertension Monitored   Priority: High  Note:   Objective:  . Last practice recorded BP readings:  . BP Readings from Last 3 Encounters: .  11/19/20 . (!) 143/63 .  10/09/20 . 114/60 .  08/23/20 . (!) 142/69 .    Marland Kitchen Most recent eGFR/CrCl: No results found for: EGFR  No components found for: CRCL Current Barriers:  Marland Kitchen Knowledge Deficits related to basic understanding of hypertension pathophysiology and self care management . Knowledge Deficits related to understanding of medications prescribed for management of  hypertension . Limited Social Support . Unable to independently manage HTN . Lacks social connections . Does not contact provider office for questions/concerns Case Manager Clinical Goal(s):  Marland Kitchen Over the next 120 days, patient will verbalize understanding of plan for hypertension management . Over the next 120 days, patient will attend all scheduled medical appointments: 04-16-2021 at 0800 am . Over the next 120 days, patient will demonstrate improved adherence to prescribed treatment plan for hypertension as evidenced by taking all medications as prescribed, monitoring and recording blood pressure as directed, adhering to low sodium/DASH diet . Over the next 120 days, patient will demonstrate improved health management independence as evidenced by checking blood pressure as directed and notifying PCP if SBP>160 or DBP > 90, taking all medications as prescribe, and adhering to a low sodium diet as discussed. . Over the next 120 days, patient will verbalize basic understanding of hypertension disease process and self health management plan as evidenced by compliance to medications, compliance with heart healthy diet and working with the CCM team to optimize health and well being.  Interventions:  .  Collaboration with [name], DO regarding development and update of comprehensive plan of care as evidenced by provider attestation and co-signature . Inter-disciplinary care team collaboration (see longitudinal plan of care) . Evaluation of current treatment plan related to hypertension self management and patient's adherence to plan as established by provider. 02-07-2021: The patient verbalized he was having blood pressures "up and down" . Says the blood pressure medications is causing him to have dizziness at times. Wants to talk to pcp about medications that will help with him not being dizzy. Education and support given.  . Provided education to patient re: stroke prevention, s/s of heart  attack and stroke, DASH diet, complications of uncontrolled blood pressure. 02-07-2021: Discussed safety and changing position slowly as the patient may be experiencing orthostatic hypotension. The patient denies falls and states he uses his "walking stick". . Reviewed medications with patient and discussed importance of compliance. The patient is taking medication Enalapril BID. Review of blood pressure readings. 02-07-2021: The patient is compliant with medications.  . Discussed plans with patient for ongoing care management follow up and provided patient with direct contact information for care management team . Advised patient, providing education and rationale, to monitor blood pressure daily and record, calling PCP for findings outside established parameters. The patient is having readings: systolic of 498-264 and diastolic 15-830. Education and support given to monitor for changes such as headache when readings are greater than 940 systolic and > 90 diastolic. The patient is recording numbers to bring to next visit. 02-07-2021: The patient did not provide readings today as he was waiting on meals on wheels, but states that his pressures are up and down. Denies any acute findings.  . Reviewed scheduled/upcoming provider appointments including: 04-16-2021 at 0800 am . Collaboration with the pcp and pharm D concerning the patient stating he has noticed more dizziness with his blood pressure medications. Wants to talk to pcp at next appointment.  Patient Goals: - blood pressure trends reviewed - depression screen reviewed - home or ambulatory blood pressure monitoring encouraged Self-Care Activities: - Self administers medications as prescribed Attends all scheduled provider appointments Calls provider office for new concerns, questions, or BP outside discussed parameters Checks BP and records as discussed Follows a low sodium diet/DASH diet Follow Up Plan: Telephone follow up appointment with care  management team member scheduled for: 04-18-2021 at 0945 am   Task: RNCM: Identify and Monitor Blood Pressure Elevation   Note:   Care Management Activities:    - blood pressure trends reviewed - depression screen reviewed - home or ambulatory blood pressure monitoring encouraged       Patient Care Plan: RNCM: HLD management    Problem Identified: RNCM: HLD Management   Priority: Medium    Long-Range Goal: RNCM: HLD Management   Priority: Medium  Note:   Current Barriers:  . Poorly controlled hyperlipidemia, complicated by elevated blood pressures . Current antihyperlipidemic regimen: no medications, diet controlled  . Most recent lipid panel:     Component Value Date/Time   CHOL 138 10/02/2020 0759   TRIG 86 10/02/2020 0759   HDL 47 10/02/2020 0759   CHOLHDL 2.9 10/02/2020 0759   VLDL 28 09/09/2016 0001   LDLCALC 74 10/02/2020 0759 .   Marland Kitchen ASCVD risk enhancing conditions: age >45, HTN  . Lacks social connections . Does not contact provider office for questions/concerns  RN Care Manager Clinical Goal(s):  Marland Kitchen Over the next 120 days, patient will work with Consulting civil engineer, providers,  and care team towards execution of optimized self-health management plan . patient will verbalize understanding of plan for HLD  . patient will work with [facility] and pcp  to address needs related to HLD . patient will attend all scheduled medical appointments: 04-16-2021 at 0800 am . patient will work with care guides  (community agency) to assist with meal delivery such as MOW or [employer]  Interventions: . Collaboration with [name], DO regarding development and update of comprehensive plan of care as evidenced by provider attestation and co-signature . Inter-disciplinary care team collaboration (see longitudinal plan of care) . Medication review performed; medication list updated in electronic medical record.  Bertram Savin care team collaboration (see  longitudinal plan of care) . Referred to pharmacy team for assistance with HLD medication management . Evaluation of current treatment plan related to HLD and patient's adherence to plan as established by provider. 02-07-2021: The patient is compliant with medications and heart healthy diet. Denies any new concerns with HLD management.  . Advised patient to call the office for changes or questions  . Provided education to patient re: heart healthy diet, exercise when it is nice outside, taking medications as prescribed  . Reviewed scheduled/upcoming provider appointments including: 04-16-2021 0800 am . Work with care guides to assist with meal delivery options: M[employer] or [employer]. 02-07-2021: The patient is now receiving meals of wheels was expecting a delivery at the time of the call.  . Discussed plans with patient for ongoing care management follow up and provided patient with direct contact information for care management team   Patient Goals/Self-Care Activities: . Over the next 120 days, patient will:   - call for medicine refill 2 or 3 days before it runs out - call if I am sick and can't take my medicine - keep a list of all the medicines I take; vitamins and herbals too - learn to read medicine labels - use a pillbox to sort medicine - use an alarm clock or phone to remind me to take my medicine - change to whole grain breads, cereal, pasta - drink 6 to 8 glasses of water each day - eat 3 to 5 servings of fruits and vegetables each day - eat 5 or 6 small meals each day - fill half the plate with nonstarchy vegetables - limit fast food meals to no more than 1 per week - manage portion size - prepare main meal at home 3 to 5 days each week - read food labels for fat, fiber, carbohydrates and portion size - be open to making changes - I can manage, know and watch for signs of a heart attack - if I have chest pain, call for help - learn about small changes that will make a  big difference - learn my personal risk factors  - barriers to meeting goals identified - change-talk evoked - choices provided - collaboration with team encouraged - decision-making supported - health risks reviewed - problem-solving facilitated - questions answered - readiness for change evaluated - reassurance provided - resources needed to meet goals identified - self-reflection promoted - self-reliance encouraged  Follow Up Plan: Telephone follow up appointment with care management team member scheduled for:  04-18-2021 at 0945 am   Task: RNCM: HLD Management   Note:   Care Management Activities:    - barriers to meeting goals identified - change-talk evoked - choices provided - collaboration with team encouraged - decision-making supported - health risks reviewed -  problem-solving facilitated - questions answered - readiness for change evaluated - reassurance provided - resources needed to meet goals identified - self-reflection promoted - self-reliance encouraged       Patient Care Plan: RNCM: Airway hypersensitivity    Problem Identified: RNCM: Airway Hypersensitivity   Priority: High    Long-Range Goal: RNCM: Shortness of Breath/ Airway Hypersensitivity Disease Progression Minimized or Managed   Priority: High  Note:   Current Barriers:  Marland Kitchen Knowledge deficits related to basic understanding of shortness of breath/airway hypersensitivity disease process . Knowledge deficits related to basic shortness of breath self care/management . Knowledge deficit related to importance of energy conservation . Limited Social Support . Unable to independently manage new onset of shortness of breath and airway hypersensitivity  . Lacks social connections . Does not contact provider office for questions/concerns  Case Manager Clinical Goal(s):  Over the next 120 days patient will report utilizing pursed lip breathing for shortness of breath  Over the next 120 days,  patient will be able to verbalize understanding of Shortness of breath action plan and when to seek appropriate levels of medical care  Over the next 120 days, patient will engage in lite exercise as tolerated to build/regain stamina and strength and reduce shortness of breath through activity tolerance  Over the next 120 days, patient will verbalize basic understanding of Shortness of breath and airway hypersensitivity  disease process and self care activities  Interventions:  . Collaboration with [name], DO regarding development and update of comprehensive plan of care as evidenced by provider attestation and co-signature . Inter-disciplinary care team collaboration (see longitudinal plan of care)  UNABLE to independently: manage new onset of shortness of breath and airway hypersensitivity   Provided patient with basic written and verbal Shortness of Breath education on self care/management/and exacerbation prevention   Provided patient with shortness of breath  action plan and reinforced importance of daily self assessment  Provided written and verbal instructions on pursed lip breathing and utilized returned demonstration as teach back  Provided instruction about proper use of medications used for management of Shortness of breath including inhalers  Advised patient to self assesses Shortness of breath  action plan zone and make appointment with provider if in the yellow zone for 48 hours without improvement. 02-07-2021: Review of sx and sx and patient is not having any active symptoms. He is using his humidifier and air purifier and this helps with his shortness of breath.   Provided patient with education about the role of exercise in the management of Shortness of breath and airway hypersensitivity   Advised patient to engage in light exercise as tolerated 3-5 days a week  Provided education about and advised patient to utilize infection prevention strategies to  reduce risk of respiratory infection  Patient Goals/Self-Care Activities:  . - activity or exercise based on tolerance encouraged . - barriers to treatment managed . - breathing techniques encouraged . - communicable disease prevention promoted . - individualized medical nutrition therapy provided . - medication-adherence assessment completed . - quality of sleep assessed . - rescue (action) plan reviewed . - screen for functional limitations completed and reviewed . - self-awareness of symptom triggers encouraged- the patient had some plug ins he was using in his home and this was causing worsening symptoms. He has removed them from his home. He is also using a humidifier and an air purifier to help with air quality in his home. 02-07-2021: The patient has given the plug ins  to his daughter. Has not had any exacerbations with shortness of breath.  Follow Up Plan: Telephone follow up appointment with care management team member scheduled for: 04-18-2021 at 0945 am   Task: RNCM: Management of shortness of breath   Note:   Care Management Activities:    - activity or exercise based on tolerance encouraged - barriers to treatment managed - breathing techniques encouraged - communicable disease prevention promoted - individualized medical nutrition therapy provided - medication-adherence assessment completed - quality of sleep assessed - rescue (action) plan reviewed - screen for functional limitations completed and reviewed - self-awareness of symptom triggers encouraged       Patient Care Plan: RNCM: Chronic Pain (Adult)- right knee pain    Problem Identified: RNCM: Pain Management Plan (Chronic Pain)- right knee pain   Priority: High    Goal: RNCM: Pain Management Plan Developed   Priority: High  Note:   Current Barriers:  Marland Kitchen Knowledge Deficits related to managing acute/chronic pain . Non-adherence to scheduled provider appointments . Non-adherence to prescribed medication  regimen . Chronic Disease Management support and education needs related to chronic pain . Unable to independently manage chronic pain to right knee  Nurse Case Manager Clinical Goal(s):  . patient will verbalize understanding of plan for managing pain . patient will attend all scheduled medical appointments: saw specialist on 01-22-2021 for pain in knee, next pcp on 04-16-2021 at 0800 am . patient will demonstrate use of different relaxation  skills and/or diversional activities to assist with pain reduction (distraction, imagery, relaxation, massage, acupressure, TENS, heat, and cold application . patient will report pain at a level less than 3 to 4 on a 10-10 rating scale . patient will use pharmacological and nonpharmacological pain relief strategies . patient will verbalize acceptable level of pain relief and ability to engage in desired activities . patient will engage in desired activities without an increase in pain level Interventions:  . Collaboration with [name], DO regarding development and update of comprehensive plan of care as evidenced by provider attestation and co-signature . Inter-disciplinary care team collaboration (see longitudinal plan of care) . - effectiveness of pharmacologic therapy monitored- patient states that the specialist did not give him pain medications. Is taking over the counter medications.  . - motivation and barriers to change assessed and addressed . - mutually acceptable comfort goal set . - pain assessed . - pain treatment goals reviewed . - premedication prior to activity encouraged . Evaluation of current treatment plan related to chronic right knee pain  and patient's adherence to plan as established by provider. . Advised patient to call the office for changes in level or intensity of pain  . Provided education to patient re: safety and preventing falls in the home and environment.  . Reviewed medications with patient and discussed  compliance . Collaborated with pcp and pharm D regarding ongoing pain in right knee and recommendations for relief of pain to right knee  . Discussed plans with patient for ongoing care management follow up and provided patient with direct contact information for care management team . Allow patient to maintain a diary of pain ratings, timing, precipitating events, medications, treatments, and what works best to relieve pain,  . Refer to support groups and self-help groups . Educate patient about the use of pharmacological interventions for pain management- antianxiety, antidepressants, NSAIDS, opioid analgesics,  . Explain the importance of lifestyle modifications to effective pain management  Patient Goals/Self Care Activities:  . - mutually  acceptable comfort goal set . - pain assessed . - pain management plan developed . - pain treatment goals reviewed . - patient response to treatment assessed . - sharing of pain management plan with teachers and other caregivers encouraged . Self-administers medications as prescribed . Attends all scheduled provider appointments . Calls pharmacy for medication refills . Calls provider office for new concerns or questions Follow Up Plan: Telephone follow up appointment with care management team member scheduled for: 04-18-2021 at 0945 am imeframe:  Short-Term Goal Priority:  High Start Date:        02-07-2021                     Expected End Date:    06-25-2021                   Follow Up Date 04-18-2021   - develop a personal pain management plan - plan exercise or activity when pain is best controlled - prioritize tasks for the day - track times pain is worst and when it is best - track what makes the pain worse and what makes it better - work slower and less intense when having pain  -follow up with specialist on a regular basis    Why is this important?    Day-to-day life can be hard when you have chronic pain.   Pain medicine is just one  piece of the treatment puzzle.   You can try these action steps to help you manage your pain.    Notes: The patient is dealing with chronic right knee pain. The patient saw specialist on 01-22-2021. Did not receive medication. Is using "walking stick"      Task: RNCM: Partner to Develop Chronic Pain Management Plan   Note:   Care Management Activities:    - mutually acceptable comfort goal set - pain assessed - pain management plan developed - pain treatment goals reviewed - patient response to treatment assessed - sharing of pain management plan with teachers and other caregivers encouraged        Patient verbalizes understanding of instructions provided today and agrees to view in [place].   Telephone follow up appointment with care management team member scheduled for: 04-18-2021  0945 am  Noreene Larsson RN, MSN, [facility] Mobile: [phone]   Chronic Knee Pain, Adult Knee pain that lasts longer than 3 months is called chronic knee pain. You may have pain in one or both knees. Symptoms of chronic knee pain may also include swelling and stiffness. The most common cause is age-related wear and tear (osteoarthritis) of your knee joint. Many conditions can cause chronic knee pain. Treatment depends on the cause. The main treatments are physical therapy and weight loss. It may also be treated with medicines, injections, a knee sleeve or brace, and by using crutches. Rest, ice, pressure (compression), and elevation, also known as RICE therapy, may also be recommended. Follow these instructions at home: If you have a knee sleeve or brace:  Wear the knee sleeve or brace as told by your doctor. Take it off only as told by your doctor.  Loosen it if your toes: ? Tingle. ? Become numb. ? Turn cold and blue.  Keep it clean.  If the sleeve or brace is not waterproof: ? Do not let it get  wet. ? Ask your doctor if you may take it off when you take  a bath or shower. If not, cover it with a watertight covering.   Managing pain, stiffness, and swelling  If told, put heat on your knee. Do this as often as told by your doctor. Use the heat source that your doctor recommends, such as a moist heat pack or a heating pad. ? If you have a removable knee sleeve or brace, take it off as told by your doctor. ? Place a towel between your skin and the heat source. ? Leave the heat on for 20-30 minutes. ? Take off the heat if your skin turns bright red. This is very important. If you cannot feel pain, heat, or cold, you have a greater risk of getting burned.  If told, put ice on your knee. To do this: ? If you have a removable knee sleeve or brace, take it off as told by your doctor. ? Put ice in a plastic bag. ? Place a towel between your skin and the bag. ? Leave the ice on for 20 minutes, 2-3 times a day. ? Take off the ice if your skin turns bright red. This is very important. If you cannot feel pain, heat, or cold, you have a greater risk of damage to the area.  Move your toes often.  Raise the injured area above the level of your heart while you are sitting or lying down.      Activity  Avoid activities where both feet leave the ground at the same time (high-impact activities). Examples are running, jumping rope, and doing jumping jacks.  Follow the exercise plan that your doctor makes for you. Your doctor may suggest that you: ? Avoid activities that make knee pain worse. You may need to change the exercises that you do, the sports that you participate in, or your job duties. ? Wear shoes with cushioned soles. ? Avoid sports that require running and sudden changes in direction. ? Do exercises or physical therapy. This is planned to match your needs and your abilities. ? Do exercises that increase your balance and strength, such as tai chi and yoga.  Do not use your injured  knee to support your body weight until your doctor says that you can. Use crutches as told by your doctor.  Return to your normal activities when your doctor says that it is safe. General instructions  Take over-the-counter and prescription medicines only as told by your doctor.  If you are overweight, work with your doctor and a food expert (dietitian) to set goals to lose weight. Being overweight can make your knee hurt more.  Do not smoke or use any products that contain nicotine or tobacco. If you need help quitting, ask your doctor.  Keep all follow-up visits. Contact a doctor if:  You have knee pain that is not getting better or gets worse.  You are not able to do your exercises due to knee pain. Get help right away if:  Your knee swells and the swelling gets worse.  You cannot move your knee.  You have very bad knee pain. Summary  Knee pain that lasts more than 3 months is called chronic knee pain.  The main treatments for chronic knee pain are physical therapy and weight loss. You may also need to take medicines, wear a knee sleeve or brace, use crutches, and put ice or heat on your knee.  Lose weight if you are overweight. Work with your doctor and a food expert (dietitian) to help you set goals to lose  weight. Being overweight can make your knee hurt more.  Follow the exercise plan that your doctor makes for you. This information is not intended to replace advice given to you by your health care provider. Make sure you discuss any questions you have with your health care provider. Document Revised: 03/28/2020 Document Reviewed: 03/28/2020 Elsevier Patient Education  2021 [employer].

## 2021-03-26 ENCOUNTER — Ambulatory Visit
Admission: RE | Admit: 2021-03-26 | Discharge: 2021-03-26 | Disposition: A | Discharge status: Home / Self Care | Payer: Medicare Other | Source: Home / Self Care | Attending: Family Medicine | Admitting: Family Medicine

## 2021-03-26 ENCOUNTER — Telehealth: Payer: Self-pay

## 2021-03-26 ENCOUNTER — Other Ambulatory Visit: Payer: Self-pay

## 2021-03-26 ENCOUNTER — Ambulatory Visit
Admission: RE | Admit: 2021-03-26 | Discharge: 2021-03-26 | Disposition: A | Discharge status: Home / Self Care | Payer: Medicare Other | Source: Ambulatory Visit | Attending: Family Medicine | Admitting: Family Medicine

## 2021-03-26 DIAGNOSIS — M79641 Pain in right hand: Secondary | ICD-10-CM | POA: Diagnosis not present

## 2021-03-26 DIAGNOSIS — M19041 Primary osteoarthritis, right hand: Secondary | ICD-10-CM | POA: Diagnosis not present

## 2021-03-26 NOTE — Telephone Encounter (Signed)
I have placed a STAT Right Hand X-ray order for this patient to be completed here at our office. He can do this first come first serve at our office anytime between now and his upcoming apt.  If he has not already scheduled for next available 6/7  Or other date, then he should go ahead and do so now.  We can review his x-ray in more detail at his apt, and if it is fractured, we can call him before the apt and may need a referral.  If he prefers sooner treatment - this could be seen at an Orthopedic Urgent Care too.  Administrator, sports - can see him during their walk in urgent care hours as well - instead if he prefers to go there and get it checked out instead.  [name], [facility] 03/26/2021, 2:13 PM

## 2021-03-26 NOTE — Telephone Encounter (Signed)
I have spoken with [name] again and she is going to let him know to come on over here this afternoon for an xray. I will let Abigail Butts know.

## 2021-03-26 NOTE — Telephone Encounter (Signed)
Copied from [place] [id number]. Topic: General - Other >> Mar 26, 2021 10:30 AM Leward Quan A wrote: Reason for CRM: Patient daughter [name] called in to inform dr Raliegh Ip that patient is having issues with his right hand and it is to the point that he is not sleeping at night and need something done ASAP. No appointment available until 04/02/21. Please call [name] at Ph# [phone]

## 2021-03-26 NOTE — Telephone Encounter (Signed)
Zachary Brewer said that it has been hurting worse since about Sunday.   Last week he was attempting to twist open a bottle and the pain has gotten worse since then.   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   She is worried he may have fractured a small bone in his hand.

## 2021-03-26 NOTE — Telephone Encounter (Signed)
Can you help find out more information what is wrong with his Right hand that is impacting his sleep? Need more info so I can help advise on what to do next. If we can work him in or call something in until he can be seen on 04/02/21 if he scheduled that apt  [name], [facility] 03/26/2021, 1:22 PM

## 2021-04-08 ENCOUNTER — Other Ambulatory Visit: Payer: Self-pay | Admitting: Family Medicine

## 2021-04-08 DIAGNOSIS — N401 Enlarged prostate with lower urinary tract symptoms: Secondary | ICD-10-CM

## 2021-04-08 DIAGNOSIS — I10 Essential (primary) hypertension: Secondary | ICD-10-CM

## 2021-04-08 DIAGNOSIS — K2100 Gastro-esophageal reflux disease with esophagitis, without bleeding: Secondary | ICD-10-CM

## 2021-04-16 ENCOUNTER — Encounter: Payer: Self-pay | Admitting: Family Medicine

## 2021-04-16 ENCOUNTER — Ambulatory Visit (INDEPENDENT_AMBULATORY_CARE_PROVIDER_SITE_OTHER): Payer: Medicare Other | Admitting: Family Medicine

## 2021-04-16 ENCOUNTER — Other Ambulatory Visit: Payer: Self-pay | Admitting: Family Medicine

## 2021-04-16 ENCOUNTER — Other Ambulatory Visit: Payer: Self-pay

## 2021-04-16 VITALS — BP 139/56 | HR 74 | Ht 67.0 in | Wt 149.0 lb

## 2021-04-16 DIAGNOSIS — R7309 Other abnormal glucose: Secondary | ICD-10-CM

## 2021-04-16 DIAGNOSIS — I10 Essential (primary) hypertension: Secondary | ICD-10-CM

## 2021-04-16 DIAGNOSIS — N401 Enlarged prostate with lower urinary tract symptoms: Secondary | ICD-10-CM

## 2021-04-16 DIAGNOSIS — R351 Nocturia: Secondary | ICD-10-CM

## 2021-04-16 DIAGNOSIS — E782 Mixed hyperlipidemia: Secondary | ICD-10-CM

## 2021-04-16 DIAGNOSIS — N138 Other obstructive and reflux uropathy: Secondary | ICD-10-CM

## 2021-04-16 DIAGNOSIS — Z0000 Encounter for general adult medical examination without abnormal findings: Secondary | ICD-10-CM

## 2021-04-16 NOTE — Patient Instructions (Addendum)
Thank you for coming to the office today.  Refilled all meds   DUE for FASTING BLOOD WORK (no food or drink after midnight before the lab appointment, only water or coffee without cream/sugar on the morning of)  SCHEDULE "Lab Only" visit in the morning at the clinic for lab draw in 6 MONTHS   - Make sure Lab Only appointment is at about 1 week before your next appointment, so that results will be available  For Lab Results, once available within 2-3 days of blood draw, you can can log in to MyChart online to view your results and a brief explanation. Also, we can discuss results at next follow-up visit.     Please schedule a Follow-up Appointment to: No follow-ups on file.  If you have any other questions or concerns, please feel free to call the office or send a message through [place]. You may also schedule an earlier appointment if necessary.  Additionally, you may be receiving a survey about your experience at our office within a few days to 1 week by e-mail or mail. We value your feedback.  [name], DO [place]

## 2021-04-16 NOTE — Assessment & Plan Note (Addendum)
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 Tamsulosin 0.4mg  x 2 daily 2. Finasteride 5mg  daily  Follow-up as planned. Discussed that if not noticeable improvement in symptoms - may have to consider consultation with Urologist

## 2021-04-16 NOTE — Assessment & Plan Note (Signed)
Mostly controlled HTN Some variable readings. Rare DBP readings >100 - Home BP readings normal, detailed log - occasional elevated SBP before takes med  No known complications    Plan:  1. Continue Enalapril 20mg  BID 2. Encourage improved lifestyle - low sodium diet, regular exercise 3. Continue monitor BP outside office, bring readings to next visit, if persistently >140/90 or new symptoms notify office sooner

## 2021-04-16 NOTE — Progress Notes (Signed)
Subjective:    Patient ID: Zachary Brewer, male    DOB: [birth year], 80 y.o.   MRN: [id number]  Zachary Brewer is a 80 y.o. male presenting on 04/16/2021 for Hypertension   HPI  CHRONIC HTN: Followed by [facility] has fluctuated blood pressure. Current Meds - Enalapril 20mg  one pill twice day Reports good compliance, took meds today. Tolerating well, w/o complaints. 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   Depression screen [facility] 2/9 02/05/2021 10/09/2020 04/09/2020  Decreased Interest 0 0 0  Down, Depressed, Hopeless 0 0 0  PHQ - 2 Score 0 0 0    Social History   Tobacco Use   Smoking status: Never   Smokeless tobacco: Never  Vaping Use   Vaping Use: Never used  Substance Use Topics   Alcohol use: No   Drug use: No    Review of Systems Per HPI unless specifically indicated above     Objective:    BP (!) 139/56   Pulse 74   Ht 5\' 7"  (1.702 m)   Wt 149 lb (67.6 kg)   SpO2 99%   BMI 23.34 kg/m   Wt Readings from Last 3 Encounters:  04/16/21 149 lb (67.6 kg)  02/05/21 144 lb (65.3 kg)  11/19/20 149 lb 3.2 oz (67.7 kg)    Physical Exam Vitals and nursing note reviewed.  Constitutional:      General: He is not in acute distress.    Appearance: He is well-developed. He is not diaphoretic.     Comments: Well-appearing, comfortable, cooperative  HENT:     Head: Normocephalic and atraumatic.  Eyes:     General:        Right eye: No discharge.        Left eye: No discharge.     Conjunctiva/sclera: Conjunctivae normal.  Neck:     Thyroid: No thyromegaly.  Cardiovascular:     Rate and Rhythm: Normal rate and regular rhythm.     Pulses: Normal pulses.      Heart sounds: Normal heart sounds. No murmur heard. Pulmonary:     Effort: Pulmonary effort is normal. No respiratory distress.     Breath sounds: Normal breath sounds. No wheezing or rales.  Musculoskeletal:        General: Normal range of motion.     Cervical back: Normal range of motion and neck supple.     Right lower leg: No edema.     Left lower leg: No edema.  Lymphadenopathy:     Cervical: No cervical adenopathy.  Skin:    General: Skin is warm and dry.     Findings: No erythema or rash.  Neurological:     Mental Status: He is alert and oriented to person, place, and time. Mental status is at baseline.  Psychiatric:        Behavior: Behavior normal.     Comments: Well groomed, good eye contact, normal speech and thoughts     Results for orders placed or performed in visit on 10/01/20  PSA  Result Value Ref Range   PSA 0.62 < OR = 4.0 ng/mL  Lipid panel  Result Value Ref Range   Cholesterol 138 <200  mg/dL   HDL 47 > OR = 40 mg/dL   Triglycerides 86 <150 mg/dL   LDL Cholesterol (Calc) 74 mg/dL (calc)   Total CHOL/HDL Ratio 2.9 <5.0 (calc)   Non-HDL Cholesterol (Calc) 91 <130 mg/dL (calc)  COMPLETE METABOLIC PANEL WITH GFR  Result Value Ref Range   Glucose, Bld 99 65 - 99 mg/dL   BUN 21 7 - 25 mg/dL   Creat 0.82 0.70 - 1.11 mg/dL   GFR, Est Non African American 83 > OR = 60 mL/min/1.35m2   GFR, Est African American 96 > OR = 60 mL/min/1.99m2   BUN/Creatinine Ratio NOT APPLICABLE 6 - 22 (calc)   Sodium 141 135 - 146 mmol/L   Potassium 3.9 3.5 - 5.3 mmol/L   Chloride 101 98 - 110 mmol/L   CO2 29 20 - 32 mmol/L   Calcium 9.3 8.6 - 10.3 mg/dL   Total Protein 6.4 6.1 - 8.1 g/dL   Albumin 4.3 3.6 - 5.1 g/dL   Globulin 2.1 1.9 - 3.7 g/dL (calc)   AG Ratio 2.0 1.0 - 2.5 (calc)   Total Bilirubin 1.7 (H) 0.2 - 1.2 mg/dL   Alkaline phosphatase (APISO) 84 35 - 144 U/L   AST 16 10 - 35 U/L   ALT 16 9 - 46 U/L  CBC with Differential/Platelet  Result Value Ref Range    WBC 10.2 3.8 - 10.8 Thousand/uL   RBC 4.99 4.20 - 5.80 Million/uL   Hemoglobin 15.9 13.2 - 17.1 g/dL   HCT 46.9 38.5 - 50.0 %   MCV 94.0 80.0 - 100.0 fL   MCH 31.9 27.0 - 33.0 pg   MCHC 33.9 32.0 - 36.0 g/dL   RDW 12.8 11.0 - 15.0 %   Platelets 195 140 - 400 Thousand/uL   MPV 11.0 7.5 - 12.5 fL   Neutro Abs 8,609 (H) 1,500 - 7,800 cells/uL   Lymphs Abs 1,051 850 - 3,900 cells/uL   Absolute Monocytes 510 200 - 950 cells/uL   Eosinophils Absolute 10 (L) 15 - 500 cells/uL   Basophils Absolute 20 0 - 200 cells/uL   Neutrophils Relative % 84.4 %   Total Lymphocyte 10.3 %   Monocytes Relative 5.0 %   Eosinophils Relative 0.1 %   Basophils Relative 0.2 %  Hemoglobin A1c  Result Value Ref Range   Hgb A1c MFr Bld 5.3 <5.7 % of total Hgb   Mean Plasma Glucose 105 mg/dL   eAG (mmol/L) 5.8 mmol/L      Assessment & Plan:   Problem List Items Addressed This Visit     Essential (primary) hypertension - Primary    Mostly controlled HTN Some variable readings. Rare DBP readings >100 - Home BP readings normal, detailed log - occasional elevated SBP before takes med  No known complications    Plan:  1. Continue Enalapril 20mg  BID 2. Encourage improved lifestyle - low sodium diet, regular exercise 3. 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 Tamsulosin 0.4mg  x 2 daily 2. Finasteride 5mg  daily  Follow-up as planned. Discussed that if not noticeable improvement  in symptoms - may have to consider consultation with Urologist        No orders of the defined types  were placed in this encounter.    Follow up plan: Return in about 6 months (around 10/16/2021) for 6 month fasting lab only then 1 week later Annual Physical.  Future labs ordered for 10/14/21   Zachary Brewer, [facility] 04/16/2021, 8:06 AM

## 2021-04-18 ENCOUNTER — Telehealth: Payer: Self-pay | Admitting: General Practice

## 2021-04-18 ENCOUNTER — Ambulatory Visit (INDEPENDENT_AMBULATORY_CARE_PROVIDER_SITE_OTHER): Payer: Medicare Other | Admitting: General Practice

## 2021-04-18 DIAGNOSIS — I10 Essential (primary) hypertension: Secondary | ICD-10-CM | POA: Diagnosis not present

## 2021-04-18 DIAGNOSIS — E782 Mixed hyperlipidemia: Secondary | ICD-10-CM

## 2021-04-18 DIAGNOSIS — M8949 Other hypertrophic osteoarthropathy, multiple sites: Secondary | ICD-10-CM | POA: Diagnosis not present

## 2021-04-18 DIAGNOSIS — M159 Polyosteoarthritis, unspecified: Secondary | ICD-10-CM

## 2021-04-18 DIAGNOSIS — R0602 Shortness of breath: Secondary | ICD-10-CM

## 2021-04-18 DIAGNOSIS — G894 Chronic pain syndrome: Secondary | ICD-10-CM

## 2021-04-18 DIAGNOSIS — J45909 Unspecified asthma, uncomplicated: Secondary | ICD-10-CM

## 2021-04-18 NOTE — Patient Instructions (Signed)
Visit Information  PATIENT GOALS:  Goals Addressed             This Visit's Progress    RNCM: Manage Chronic Pain right knee       Timeframe:  Short-Term Goal Priority:  High Start Date:        02-07-2021                     Expected End Date:    06-25-2021                   Follow Up Date 06-20-2021   - develop a personal pain management plan - plan exercise or activity when pain is best controlled - prioritize tasks for the day - track times pain is worst and when it is best - track what makes the pain worse and what makes it better - work slower and less intense when having pain  -follow up with specialist on a regular basis    Why is this important?   Day-to-day life can be hard when you have chronic pain.  Pain medicine is just one piece of the treatment puzzle.  You can try these action steps to help you manage your pain.    Notes: The patient is dealing with chronic right knee pain. The patient saw specialist on 01-22-2021. Did not receive medication. Is using "walking stick". 04-18-2021: The patient states he still feels there is fluid on his knee but saw pcp recently and no changes in the plan of care at this time. Will continue to monitor. Remains safe in his environment.         Patient Care Plan: RNCM: Hypertension (Adult)     Problem Identified: RNCM: Hypertension (Hypertension)   Priority: High     Long-Range Goal: RNCM: Hypertension Monitored   Start Date: 10/18/2020  Expected End Date: 12/14/2021  This Visit's Progress: On track  Priority: High  Note:   Objective:  Last practice recorded BP readings:  BP Readings from Last 3 Encounters:  04/16/21 (!) 139/56  11/19/20 (!) 143/63  10/09/20 114/60   Most recent eGFR/CrCl: No results found for: EGFR  No components found for: CRCL Current Barriers:  Knowledge Deficits related to basic understanding of hypertension pathophysiology and self care management Knowledge Deficits related to understanding of  medications prescribed for management of hypertension Unable to independently manage HTN. Blood pressures up and down. States stable at home. Will continue to monitor. Does not contact provider office for questions/concerns Case Manager Clinical Goal(s):  patient will verbalize understanding of plan for hypertension management patient will attend all scheduled medical appointments: 10-18-2021 at 0900 am patient will demonstrate improved adherence to prescribed treatment plan for hypertension as evidenced by taking all medications as prescribed, monitoring and recording blood pressure as directed, adhering to low sodium/DASH diet patient will demonstrate improved health management independence as evidenced by checking blood pressure as directed and notifying PCP if SBP>150 or DBP > 90, taking all medications as prescribe, and adhering to a low sodium diet as discussed. patient will verbalize basic understanding of hypertension disease process and self health management plan as evidenced by compliance with heart healthy diet, compliance with medications and working with the CCM team to manage chronic health conditions and reach wellness goals.  Interventions:  Collaboration with [name], DO regarding development and update of comprehensive plan of care as evidenced by provider attestation and co-signature Inter-disciplinary care team collaboration (see longitudinal plan of care) Evaluation  of current treatment plan related to hypertension self management and patient's adherence to plan as established by provider. Provided education to patient re: stroke prevention, s/s of heart attack and stroke, DASH diet, complications of uncontrolled blood pressure. The patient was getting meals on wheels but now is eating in the cafeteria of his apartment building. He usually goes each day around 11 am. States this is working well for him. He denies any new issues with his blood pressures. Saw pcp  recently and no changes made in the plan of care.  Reviewed medications with patient and discussed importance of compliance. 04-18-2021: The patient is compliant with his plan of care.  Discussed plans with patient for ongoing care management follow up and provided patient with direct contact information for care management team Advised patient, providing education and rationale, to monitor blood pressure daily and record, calling PCP for findings outside established parameters.  Reviewed scheduled/upcoming provider appointments including: 10-18-2021 at 0900 am Self-Care Activities: - Self administers medications as prescribed Attends all scheduled provider appointments Calls provider office for new concerns, questions, or BP outside discussed parameters Checks BP and records as discussed Follows a low sodium diet/DASH diet Patient Goals: - check blood pressure 3 times per week - choose a place to take my blood pressure (home, clinic or office, retail store) - write blood pressure results in a log or diary - agree on reward when goals are met - agree to work together to make changes - ask questions to understand - have a family meeting to talk about healthy habits - learn about high blood pressure  Follow Up Plan: Telephone follow up appointment with care management team member scheduled for: 06-20-2021 at 0945 am    Task: RNCM: Identify and Monitor Blood Pressure Elevation   Note:   Care Management Activities:    - blood pressure trends reviewed - depression screen reviewed - home or ambulatory blood pressure monitoring encouraged        Patient Care Plan: RNCM: HLD management     Problem Identified: RNCM: HLD Management   Priority: Medium     Long-Range Goal: RNCM: HLD Management   Start Date: 10/18/2021  Expected End Date: 12/14/2021  This Visit's Progress: On track  Priority: Medium  Note:   Current Barriers:  Poorly controlled hyperlipidemia, complicated by  HTN Current antihyperlipidemic regimen: diet controlled  Most recent lipid panel:     Component Value Date/Time   CHOL 138 10/02/2020 0759   TRIG 86 10/02/2020 0759   HDL 47 10/02/2020 0759   CHOLHDL 2.9 10/02/2020 0759   VLDL 28 09/09/2016 0001   LDLCALC 74 10/02/2020 0759   ASCVD risk enhancing conditions: age >31 Does not contact provider office for questions/concerns RN Care Manager Clinical Goal(s):  patient will work with [place], providers, and care team towards execution of optimized self-health management plan patient will verbalize understanding of plan for effective management of HLD  patient will work with [place], pcp and CCM team  to address needs related to HLD patient will take all medications exactly as prescribed and will call provider for medication related questions patient will attend all scheduled medical appointments: 10-18-2021 at 0900 am the patient will demonstrate ongoing self health care management ability Interventions: Collaboration with [name], DO regarding development and update of comprehensive plan of care as evidenced by provider attestation and co-signature Inter-disciplinary care team collaboration (see longitudinal plan of care) Medication review performed; medication list updated in electronic medical record.  Inter-disciplinary care team collaboration (see longitudinal plan of care) Referred to pharmacy team for assistance with HLD medication management Evaluation of current treatment plan related to HLD and patient's adherence to plan as established by provider. Advised patient to call the office for any concerns or questions.  Reviewed scheduled/upcoming provider appointments including: 06-20-2021 at 0945 am Discussed plans with patient for ongoing care management follow up and provided patient with direct contact information for care management team Patient Goals/Self-Care Activities: - call for medicine refill 2 or 3  days before it runs out - call if I am sick and can't take my medicine - keep a list of all the medicines I take; vitamins and herbals too - learn to read medicine labels - use a pillbox to sort medicine - use an alarm clock or phone to remind me to take my medicine - change to whole grain breads, cereal, pasta - drink 6 to 8 glasses of water each day - eat 3 to 5 servings of fruits and vegetables each day - eat 5 or 6 small meals each day - eat fish at least once per week - fill half the plate with nonstarchy vegetables - keep a food diary - limit fast food meals to no more than 1 per week - manage portion size - read food labels for fat, fiber, carbohydrates and portion size - be open to making changes - I can manage, know and watch for signs of a heart attack - if I have chest pain, call for help - learn about small changes that will make a big difference - learn my personal risk factors - barriers to meeting goals identified - change-talk evoked - choices provided - collaboration with team encouraged - decision-making supported - health risks reviewed - problem-solving facilitated - questions answered - readiness for change evaluated - reassurance provided - resources needed to meet goals identified - self-reflection promoted - self-reliance encouraged Follow Up Plan: Telephone follow up appointment with care management team member scheduled for: 06-20-2021 at 0945 am      Task: RNCM: HLD Management   Note:   Care Management Activities:    - barriers to meeting goals identified - change-talk evoked - choices provided - collaboration with team encouraged - decision-making supported - health risks reviewed - problem-solving facilitated - questions answered - readiness for change evaluated - reassurance provided - resources needed to meet goals identified - self-reflection promoted - self-reliance encouraged        Patient Care Plan: RNCM: Airway  hypersensitivity     Problem Identified: RNCM: Airway Hypersensitivity   Priority: High     Long-Range Goal: RNCM: Shortness of Breath/ Airway Hypersensitivity Disease Progression Minimized or Managed   Start Date: 10/18/2020  This Visit's Progress: On track  Priority: High  Note:   Current Barriers:  Knowledge deficits related to basic understanding of shortness of breath and airway hypersensitivity disease process Knowledge deficits related to basic shortness of breath and airway hypersensitivity self care/management Knowledge deficit related to basic understanding of how to use inhalers and how inhaled medications work Knowledge deficit related to importance of energy conservation Limited Social Support Unable to independently manage episodes of shortness of breath with airway hypersensitivity disease process Lacks social connections Does not contact provider office for questions/concerns  Case Manager Clinical Goal(s): patient will report utilizing pursed lip breathing for shortness of breath patient will verbalize understanding of shortness of breath action plan and when to seek appropriate levels of medical care patient will  engage in lite exercise as tolerated to build/regain stamina and strength and reduce shortness of breath through activity tolerance patient will verbalize basic understanding of shortness of breath  disease process and self care activities patient will not be hospitalized for shortness of breath exacerbation as evidenced of working with the CCM team to optimize pulmonary health and well being.  Interventions:  Collaboration with [name], DO regarding development and update of comprehensive plan of care as evidenced by provider attestation and co-signature Inter-disciplinary care team collaboration (see longitudinal plan of care) Provided patient with basic written and verbal shortness of breath and airway hypersensitively  education on self  care/management/and exacerbation prevention  Provided patient with shortness of breath action plan and reinforced importance of daily self assessment Provided written and verbal instructions on pursed lip breathing and utilized returned demonstration as teach back Provided instruction about proper use of medications used for management of shortness of breath  including inhalers Advised patient to self assesses shortness of breath action plan zone and make appointment with provider if in the yellow zone for 48 hours without improvement. Provided patient with education about the role of exercise in the management of shortness of breath Advised patient to engage in light exercise as tolerated 3-5 days a week Provided education about and advised patient to utilize infection prevention strategies to reduce risk of respiratory infection  Self-Care Activities:  Patient verbalizes understanding of plan to effective management of shortness of breath onset and airway hypersensitivity disease process Self administers medications as prescribed Attends all scheduled provider appointments Calls pharmacy for medication refills Attends church or other social activities Performs ADL's independently Performs IADL's independently Calls provider office for new concerns or questions Patient Goals: - do breathing exercises every day - do breathing exercises at least 2 times each day - do exercises in a comfortable position that makes breathing as easy as possible - develop a new routine to improve sleep - don't eat or exercise right before bedtime - eat healthy - get at least 7 to 8 hours of sleep at night - get outdoors every day (weather permitting) - keep room cool and dark - limit daytime naps - practice relaxation or meditation daily - use a fan or white noise in bedroom - use devices that will help like a cane, sock-puller or reacher - develop a rescue plan - eliminate symptom triggers at home- has  removed all "plug ins" - follow rescue plan if symptoms flare-up - keep follow-up appointments - use an extra pillow to sleep - avoid second hand smoke - eliminate smoking in my home - identify and avoid work-related triggers - identify and remove indoor air pollutants- patient has not used anymore "plug ins", this caused him to have an exacerbation of his shortness of breath back in the spring - limit outdoor activity during cold weather - listen for public air quality announcements every day - activity or exercise based on tolerance encouraged - barriers to treatment managed - breathing techniques encouraged - communicable disease prevention promoted - individualized medical nutrition therapy provided - medication-adherence assessment completed - quality of sleep assessed - rescue (action) plan reviewed - screen for functional limitations completed and reviewed - self-awareness of symptom triggers encouraged  Follow Up Plan: Telephone follow up appointment with care management team member scheduled for: 06-20-2021 at 0945 am    Task: RNCM: Management of shortness of breath   Note:   Care Management Activities:    - activity or exercise based on  tolerance encouraged - barriers to treatment managed - breathing techniques encouraged - communicable disease prevention promoted - individualized medical nutrition therapy provided - medication-adherence assessment completed - quality of sleep assessed - rescue (action) plan reviewed - screen for functional limitations completed and reviewed - self-awareness of symptom triggers encouraged        Patient Care Plan: RNCM: Chronic Pain (Adult)- right knee pain     Problem Identified: RNCM: Pain Management Plan (Chronic Pain)- right knee pain   Priority: High     Long-Range Goal: RNCM: Pain Management Plan Developed   Start Date: 10/18/2020  Expected End Date: 08/16/2021  This Visit's Progress: On track  Priority: High   Note:   Current Barriers:  Knowledge Deficits related to managing acute/chronic pain Non-adherence to scheduled provider appointments Non-adherence to prescribed medication regimen Chronic Disease Management support and education needs related to chronic pain Unable to independently manage chronic pain to right knee  Nurse Case Manager Clinical Goal(s):  patient will verbalize understanding of plan for managing pain patient will attend all scheduled medical appointments: saw specialist on 01-22-2021 for pain in knee, next pcp on 10-18-2021 at 0900 am patient will demonstrate use of different relaxation  skills and/or diversional activities to assist with pain reduction (distraction, imagery, relaxation, massage, acupressure, TENS, heat, and cold application patient will report pain at a level less than 3 to 4 on a 10-10 rating scale patient will use pharmacological and nonpharmacological pain relief strategies patient will verbalize acceptable level of pain relief and ability to engage in desired activities patient will engage in desired activities without an increase in pain level Interventions:  Collaboration with [place], Devonne Doughty, DO regarding development and update of comprehensive plan of care as evidenced by provider attestation and co-signature Inter-disciplinary care team collaboration (see longitudinal plan of care) - effectiveness of pharmacologic therapy monitored- patient states that the specialist did not give him pain medications. Is taking over the counter medications.  - motivation and barriers to change assessed and addressed. 04-18-2021: The patient states that he still believes he has fluid on his knee but he says some days are better than others. The patient denies any acute distress. Denies falls or safety concerns - mutually acceptable comfort goal set - pain assessed. 04-18-2021: Denies pain or discomfort at this time.  - pain treatment goals reviewed -  premedication prior to activity encouraged Evaluation of current treatment plan related to chronic right knee pain  and patient's adherence to plan as established by provider. 04-18-2021: Saw the pcp on 6-21--2022- no changes in the plan of care at this time. States he is doing well today and denies any acute distress. Takes OTC medications when pain relief needed. Will continue to monitor.  Advised patient to call the office for changes in level or intensity of pain  Provided education to patient re: safety and preventing falls in the home and environment.  Reviewed medications with patient and discussed compliance Collaborated with pcp and pharm D regarding ongoing pain in right knee and recommendations for relief of pain to right knee  Discussed plans with patient for ongoing care management follow up and provided patient with direct contact information for care management team Allow patient to maintain a diary of pain ratings, timing, precipitating events, medications, treatments, and what works best to relieve pain,  Refer to support groups and self-help groups Educate patient about the use of pharmacological interventions for pain management- antianxiety, antidepressants, NSAIDS, opioid analgesics,  Explain the importance of lifestyle  modifications to effective pain management  Patient Goals/Self Care Activities:  - mutually acceptable comfort goal set - pain assessed - pain management plan developed - pain treatment goals reviewed - patient response to treatment assessed - sharing of pain management plan with teachers and other caregivers encouraged Self-administers medications as prescribed Attends all scheduled provider appointments Calls pharmacy for medication refills Calls provider office for new concerns or questions Follow Up Plan: Telephone follow up appointment with care management team member scheduled for: 8-25--2022 at 0945 am imeframe:  Short-Term Goal Priority:   High Start Date:        02-07-2021                     Expected End Date:    06-25-2021                   Follow Up Date 06-20-2021   - develop a personal pain management plan - plan exercise or activity when pain is best controlled - prioritize tasks for the day - track times pain is worst and when it is best - track what makes the pain worse and what makes it better - work slower and less intense when having pain  -follow up with specialist on a regular basis    Why is this important?   Day-to-day life can be hard when you have chronic pain.  Pain medicine is just one piece of the treatment puzzle.  You can try these action steps to help you manage your pain.    Notes: The patient is dealing with chronic right knee pain. The patient saw specialist on 01-22-2021. Did not receive medication. Is using "walking stick".  04-18-2021: The patient states he still feels there is fluid on his knee but saw pcp recently and no changes in the plan of care at this time. Will continue to monitor. Remains safe in his environment.       Task: RNCM: Partner to Develop Chronic Pain Management Plan   Note:   Care Management Activities:    - mutually acceptable comfort goal set - pain assessed - pain management plan developed - pain treatment goals reviewed - patient response to treatment assessed - sharing of pain management plan with teachers and other caregivers encouraged         Patient verbalizes understanding of instructions provided today and agrees to view in [place].   Telephone follow up appointment with care management team member scheduled for: 06-20-2021 at [place] am  Noreene Larsson RN, MSN, [place] [place] Mobile: [phone]

## 2021-04-18 NOTE — Chronic Care Management (AMB) (Signed)
Chronic Care Management   CCM RN Visit Note  04/18/2021 Name: Zachary Brewer MRN: [id number] DOB: 07-08-1941  Subjective: Zachary Brewer is a 80 y.o. year old male who is a primary care patient of [name], DO. The care management team was consulted for assistance with disease management and care coordination needs.    Engaged with patient by telephone for follow up visit in response to provider referral for case management and/or care coordination services.   Consent to Services:  The patient was given information about Chronic Care Management services, agreed to services, and gave verbal consent prior to initiation of services.  Please see initial visit note for detailed documentation.   Patient agreed to services and verbal consent obtained.   Assessment: Review of patient past medical history, allergies, medications, health status, including review of consultants reports, laboratory and other test data, was performed as part of comprehensive evaluation and provision of chronic care management services.   SDOH (Social Determinants of Health) assessments and interventions performed:    CCM Care Plan  No Known Allergies  Outpatient Encounter Medications as of 04/18/2021  Medication Sig   ACCU-CHEK AVIVA PLUS test strip USE TO CHECK BLOOD SUGAR ONCE A DAY   Accu-Chek FastClix Lancets MISC Use to check blood sugar up to 1 x weekly   aspirin EC 81 MG tablet Take 81 mg by mouth daily.   Blood Glucose Monitoring Suppl (ACCU-CHEK AVIVA PLUS) w/Device KIT Use glucometer to check blood sugar 1 x weekly as advised   Calcium Carb-Cholecalciferol (CALCIUM-VITAMIN D) 500-400 MG-UNIT TABS TAKE ONE TABLET BY MOUTH TWICE DAILY WITH MEALS (Patient not taking: No sig reported)   Cholecalciferol (VITAMIN D3) 1000 units CAPS Take 1,000 Units by mouth daily.   enalapril (VASOTEC) 20 MG tablet TAKE ONE TABLET BY MOUTH TWICE DAILY   finasteride (PROSCAR) 5 MG tablet TAKE ONE TABLET BY MOUTH  ONCE DAILY   Glucosamine-Chondroit-Vit C-Mn (GLUCOSAMINE 1500 COMPLEX PO) Take 1,500 mg by mouth daily.   Multiple Vitamin (MULTIVITAMIN) tablet Take 1 tablet by mouth daily. Reported on 11/21/2015   omeprazole (PRILOSEC) 20 MG capsule TAKE ONE CAPSULE BY MOUTH TWICE DAILY BEFORE MEALS   psyllium (METAMUCIL) 58.6 % powder Take 1 packet by mouth daily.   tamsulosin (FLOMAX) 0.4 MG CAPS capsule TAKE TWO CAPSULES BY MOUTH EVERY MORNING TAKE AFTER BREAKFAST   No facility-administered encounter medications on file as of 04/18/2021.    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   Gastric outlet obstruction    Swallowing difficulty    Allergic rhinitis 05/30/2015   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/30/2015   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   Bradycardia 05/30/2015   Essential (primary) hypertension 05/30/2015   Esophagitis, reflux 05/30/2015   Lipoma of neck 05/30/2015   Blood glucose elevated 05/30/2015   Lump in scrotum 05/30/2015    Conditions to be addressed/monitored:HTN, HLD, Pulmonary Disease, and shortness of breath with airway hypersensitivity disease process and chronic knee pain  Care Plan : RNCM: Hypertension (Adult)  Updates made by Vanita Ingles since 04/18/2021 12:00 AM     Problem: RNCM: Hypertension (Hypertension)   Priority: High     Long-Range Goal: RNCM: Hypertension Monitored   Start Date: 10/18/2020  Expected End Date:  12/14/2021  This Visit's Progress: On track  Priority: High  Note:   Objective:  Last practice recorded BP readings:  BP Readings from Last 3 Encounters:  04/16/21 (!) 139/56  11/19/20 (!) 143/63  10/09/20 114/60   Most recent eGFR/CrCl: No results found for: EGFR  No components found for: CRCL Current Barriers:  Knowledge Deficits related to basic understanding of hypertension pathophysiology and self care management Knowledge Deficits related to understanding of medications prescribed for management of hypertension Unable to independently manage HTN. Blood pressures up and down. States stable at home. Will continue to monitor. Does not contact provider office for questions/concerns Case Manager Clinical Goal(s):  patient will verbalize understanding of plan for hypertension management patient will attend all scheduled medical appointments: 10-18-2021 at 0900 am patient will demonstrate improved adherence to prescribed treatment plan for hypertension as evidenced by taking all medications as prescribed, monitoring and recording blood pressure as directed, adhering to low sodium/DASH diet patient will demonstrate improved health management independence as evidenced by checking blood pressure as directed and notifying PCP if SBP>150 or DBP > 90, taking all medications as prescribe, and adhering to a low sodium diet as discussed. patient will verbalize basic understanding of hypertension disease process and self health management plan as evidenced by compliance with heart healthy diet, compliance with medications and working with the CCM team to manage chronic health conditions and reach wellness goals.  Interventions:  Collaboration with [name], DO regarding development and update of comprehensive plan of care as evidenced by provider attestation and co-signature Inter-disciplinary care team collaboration (see longitudinal plan of care) Evaluation of current treatment plan  related to hypertension self management and patient's adherence to plan as established by provider. Provided education to patient re: stroke prevention, s/s of heart attack and stroke, DASH diet, complications of uncontrolled blood pressure. The patient was getting meals on wheels but now is eating in the cafeteria of his apartment building. He usually goes each day around 11 am. States this is working well for him. He denies any new issues with his blood pressures. Saw pcp recently and no changes made in the plan of care.  Reviewed medications with patient and discussed importance of compliance. 04-18-2021: The patient is compliant with his plan of care.  Discussed plans with patient for ongoing care management follow up and provided patient with direct contact information for care management team Advised patient, providing education and rationale, to monitor blood pressure daily and record, calling PCP for findings outside established parameters.  Reviewed scheduled/upcoming provider appointments including: 10-18-2021 at 0900 am Self-Care Activities: - Self administers medications as prescribed Attends all scheduled provider appointments Calls provider office for new concerns, questions, or BP outside discussed parameters Checks BP and records as discussed Follows a low sodium diet/DASH diet Patient Goals: - check blood pressure 3 times per week - choose a place to take my blood pressure (home, clinic or office, retail store) - write blood pressure results in a log or diary - agree on reward when goals are met - agree to work together to make changes - ask questions to understand - have a family meeting to talk about healthy habits - learn about high blood pressure  Follow Up Plan: Telephone follow up appointment  with care management team member scheduled for: 06-20-2021 at 0945 am    Care Plan : RNCM: HLD management  Updates made by Vanita Ingles since 04/18/2021 12:00 AM     Problem:  RNCM: HLD Management   Priority: Medium     Long-Range Goal: RNCM: HLD Management   Start Date: 10/18/2021  Expected End Date: 12/14/2021  This Visit's Progress: On track  Priority: Medium  Note:   Current Barriers:  Poorly controlled hyperlipidemia, complicated by HTN Current antihyperlipidemic regimen: diet controlled  Most recent lipid panel:     Component Value Date/Time   CHOL 138 10/02/2020 0759   TRIG 86 10/02/2020 0759   HDL 47 10/02/2020 0759   CHOLHDL 2.9 10/02/2020 0759   VLDL 28 09/09/2016 0001   LDLCALC 74 10/02/2020 0759   ASCVD risk enhancing conditions: age >75 Does not contact provider office for questions/concerns RN Care Manager Clinical Goal(s):  patient will work with [place], providers, and care team towards execution of optimized self-health management plan patient will verbalize understanding of plan for effective management of HLD  patient will work with [place], pcp and CCM team  to address needs related to HLD patient will take all medications exactly as prescribed and will call provider for medication related questions patient will attend all scheduled medical appointments: 10-18-2021 at 0900 am the patient will demonstrate ongoing self health care management ability Interventions: Collaboration with [name], DO regarding development and update of comprehensive plan of care as evidenced by provider attestation and co-signature Inter-disciplinary care team collaboration (see longitudinal plan of care) Medication review performed; medication list updated in electronic medical record.  Inter-disciplinary care team collaboration (see longitudinal plan of care) Referred to pharmacy team for assistance with HLD medication management Evaluation of current treatment plan related to HLD and patient's adherence to plan as established by provider. Advised patient to call the office for any concerns or questions.  Reviewed  scheduled/upcoming provider appointments including: 06-20-2021 at 0945 am Discussed plans with patient for ongoing care management follow up and provided patient with direct contact information for care management team Patient Goals/Self-Care Activities: - call for medicine refill 2 or 3 days before it runs out - call if I am sick and can't take my medicine - keep a list of all the medicines I take; vitamins and herbals too - learn to read medicine labels - use a pillbox to sort medicine - use an alarm clock or phone to remind me to take my medicine - change to whole grain breads, cereal, pasta - drink 6 to 8 glasses of water each day - eat 3 to 5 servings of fruits and vegetables each day - eat 5 or 6 small meals each day - eat fish at least once per week - fill half the plate with nonstarchy vegetables - keep a food diary - limit fast food meals to no more than 1 per week - manage portion size - read food labels for fat, fiber, carbohydrates and portion size - be open to making changes - I can manage, know and watch for signs of a heart attack - if I have chest pain, call for help - learn about small changes that will make a big difference - learn my personal risk factors - barriers to meeting goals identified - change-talk evoked - choices provided - collaboration with team encouraged - decision-making supported - health risks reviewed - problem-solving facilitated - questions answered - readiness for change evaluated -  reassurance provided - resources needed to meet goals identified - self-reflection promoted - self-reliance encouraged Follow Up Plan: Telephone follow up appointment with care management team member scheduled for: 06-20-2021 at 0945 am      Care Plan : RNCM: Airway hypersensitivity  Updates made by Vanita Ingles since 04/18/2021 12:00 AM     Problem: RNCM: Airway Hypersensitivity   Priority: High     Long-Range Goal: RNCM: Shortness of Breath/ Airway  Hypersensitivity Disease Progression Minimized or Managed   Start Date: 10/18/2020  This Visit's Progress: On track  Priority: High  Note:   Current Barriers:  Knowledge deficits related to basic understanding of shortness of breath and airway hypersensitivity disease process Knowledge deficits related to basic shortness of breath and airway hypersensitivity self care/management Knowledge deficit related to basic understanding of how to use inhalers and how inhaled medications work Knowledge deficit related to importance of energy conservation Limited Social Support Unable to independently manage episodes of shortness of breath with airway hypersensitivity disease process Lacks social connections Does not contact provider office for questions/concerns  Case Manager Clinical Goal(s): patient will report utilizing pursed lip breathing for shortness of breath patient will verbalize understanding of shortness of breath action plan and when to seek appropriate levels of medical care patient will engage in lite exercise as tolerated to build/regain stamina and strength and reduce shortness of breath through activity tolerance patient will verbalize basic understanding of shortness of breath  disease process and self care activities patient will not be hospitalized for shortness of breath exacerbation as evidenced of working with the CCM team to optimize pulmonary health and well being.  Interventions:  Collaboration with [name], DO regarding development and update of comprehensive plan of care as evidenced by provider attestation and co-signature Inter-disciplinary care team collaboration (see longitudinal plan of care) Provided patient with basic written and verbal shortness of breath and airway hypersensitively  education on self care/management/and exacerbation prevention  Provided patient with shortness of breath action plan and reinforced importance of daily self  assessment Provided written and verbal instructions on pursed lip breathing and utilized returned demonstration as teach back Provided instruction about proper use of medications used for management of shortness of breath  including inhalers Advised patient to self assesses shortness of breath action plan zone and make appointment with provider if in the yellow zone for 48 hours without improvement. Provided patient with education about the role of exercise in the management of shortness of breath Advised patient to engage in light exercise as tolerated 3-5 days a week Provided education about and advised patient to utilize infection prevention strategies to reduce risk of respiratory infection  Self-Care Activities:  Patient verbalizes understanding of plan to effective management of shortness of breath onset and airway hypersensitivity disease process Self administers medications as prescribed Attends all scheduled provider appointments Calls pharmacy for medication refills Attends church or other social activities Performs ADL's independently Performs IADL's independently Calls provider office for new concerns or questions Patient Goals: - do breathing exercises every day - do breathing exercises at least 2 times each day - do exercises in a comfortable position that makes breathing as easy as possible - develop a new routine to improve sleep - don't eat or exercise right before bedtime - eat healthy - get at least 7 to 8 hours of sleep at night - get outdoors every day (weather permitting) - keep room cool and dark - limit daytime naps - practice relaxation or meditation  daily - use a fan or white noise in bedroom - use devices that will help like a cane, sock-puller or reacher - develop a rescue plan - eliminate symptom triggers at home- has removed all "plug ins" - follow rescue plan if symptoms flare-up - keep follow-up appointments - use an extra pillow to sleep - avoid  second hand smoke - eliminate smoking in my home - identify and avoid work-related triggers - identify and remove indoor air pollutants- patient has not used anymore "plug ins", this caused him to have an exacerbation of his shortness of breath back in the spring - limit outdoor activity during cold weather - listen for public air quality announcements every day - activity or exercise based on tolerance encouraged - barriers to treatment managed - breathing techniques encouraged - communicable disease prevention promoted - individualized medical nutrition therapy provided - medication-adherence assessment completed - quality of sleep assessed - rescue (action) plan reviewed - screen for functional limitations completed and reviewed - self-awareness of symptom triggers encouraged  Follow Up Plan: Telephone follow up appointment with care management team member scheduled for: 06-20-2021 at [place] am    Care Plan : RNCM: Chronic Pain (Adult)- right knee pain  Updates made by Vanita Ingles since 04/18/2021 12:00 AM     Problem: RNCM: Pain Management Plan (Chronic Pain)- right knee pain   Priority: High     Long-Range Goal: RNCM: Pain Management Plan Developed   Start Date: 10/18/2020  Expected End Date: 08/16/2021  This Visit's Progress: On track  Priority: High  Note:   Current Barriers:  Knowledge Deficits related to managing acute/chronic pain Non-adherence to scheduled provider appointments Non-adherence to prescribed medication regimen Chronic Disease Management support and education needs related to chronic pain Unable to independently manage chronic pain to right knee  Nurse Case Manager Clinical Goal(s):  patient will verbalize understanding of plan for managing pain patient will attend all scheduled medical appointments: saw specialist on 01-22-2021 for pain in knee, next pcp on 10-18-2021 at 0900 am patient will demonstrate use of different relaxation  skills and/or  diversional activities to assist with pain reduction (distraction, imagery, relaxation, massage, acupressure, TENS, heat, and cold application patient will report pain at a level less than 3 to 4 on a 10-10 rating scale patient will use pharmacological and nonpharmacological pain relief strategies patient will verbalize acceptable level of pain relief and ability to engage in desired activities patient will engage in desired activities without an increase in pain level Interventions:  Collaboration with [place], Devonne Doughty, DO regarding development and update of comprehensive plan of care as evidenced by provider attestation and co-signature Inter-disciplinary care team collaboration (see longitudinal plan of care) - effectiveness of pharmacologic therapy monitored- patient states that the specialist did not give him pain medications. Is taking over the counter medications.  - motivation and barriers to change assessed and addressed. 04-18-2021: The patient states that he still believes he has fluid on his knee but he says some days are better than others. The patient denies any acute distress. Denies falls or safety concerns - mutually acceptable comfort goal set - pain assessed. 04-18-2021: Denies pain or discomfort at this time.  - pain treatment goals reviewed - premedication prior to activity encouraged Evaluation of current treatment plan related to chronic right knee pain  and patient's adherence to plan as established by provider. 04-18-2021: Saw the pcp on 6-21--2022- no changes in the plan of care at this time. States he is doing  well today and denies any acute distress. Takes OTC medications when pain relief needed. Will continue to monitor.  Advised patient to call the office for changes in level or intensity of pain  Provided education to patient re: safety and preventing falls in the home and environment.  Reviewed medications with patient and discussed compliance Collaborated with  pcp and pharm D regarding ongoing pain in right knee and recommendations for relief of pain to right knee  Discussed plans with patient for ongoing care management follow up and provided patient with direct contact information for care management team Allow patient to maintain a diary of pain ratings, timing, precipitating events, medications, treatments, and what works best to relieve pain,  Refer to support groups and self-help groups Educate patient about the use of pharmacological interventions for pain management- antianxiety, antidepressants, NSAIDS, opioid analgesics,  Explain the importance of lifestyle modifications to effective pain management  Patient Goals/Self Care Activities:  - mutually acceptable comfort goal set - pain assessed - pain management plan developed - pain treatment goals reviewed - patient response to treatment assessed - sharing of pain management plan with teachers and other caregivers encouraged Self-administers medications as prescribed Attends all scheduled provider appointments Calls pharmacy for medication refills Calls provider office for new concerns or questions Follow Up Plan: Telephone follow up appointment with care management team member scheduled for: 8-25--2022 at 0945 am imeframe:  Short-Term Goal Priority:  High Start Date:        02-07-2021                     Expected End Date:    06-25-2021                   Follow Up Date 06-20-2021   - develop a personal pain management plan - plan exercise or activity when pain is best controlled - prioritize tasks for the day - track times pain is worst and when it is best - track what makes the pain worse and what makes it better - work slower and less intense when having pain  -follow up with specialist on a regular basis    Why is this important?   Day-to-day life can be hard when you have chronic pain.  Pain medicine is just one piece of the treatment puzzle.  You can try these action steps to  help you manage your pain.    Notes: The patient is dealing with chronic right knee pain. The patient saw specialist on 01-22-2021. Did not receive medication. Is using "walking stick".  04-18-2021: The patient states he still feels there is fluid on his knee but saw pcp recently and no changes in the plan of care at this time. Will continue to monitor. Remains safe in his environment.        Plan:Telephone follow up appointment with care management team member scheduled for:  06-20-2021 at [place] am  Noreene Larsson RN, MSN, [place] Mobile: [phone]

## 2021-05-28 DIAGNOSIS — I493 Ventricular premature depolarization: Secondary | ICD-10-CM | POA: Diagnosis not present

## 2021-05-28 DIAGNOSIS — M159 Polyosteoarthritis, unspecified: Secondary | ICD-10-CM | POA: Diagnosis not present

## 2021-05-28 DIAGNOSIS — I499 Cardiac arrhythmia, unspecified: Secondary | ICD-10-CM | POA: Diagnosis not present

## 2021-05-28 DIAGNOSIS — R001 Bradycardia, unspecified: Secondary | ICD-10-CM | POA: Diagnosis not present

## 2021-05-28 DIAGNOSIS — M1180 Other specified crystal arthropathies, unspecified site: Secondary | ICD-10-CM | POA: Diagnosis not present

## 2021-05-28 DIAGNOSIS — I10 Essential (primary) hypertension: Secondary | ICD-10-CM | POA: Diagnosis not present

## 2021-06-20 ENCOUNTER — Ambulatory Visit (INDEPENDENT_AMBULATORY_CARE_PROVIDER_SITE_OTHER): Payer: Medicare Other | Admitting: General Practice

## 2021-06-20 ENCOUNTER — Telehealth: Payer: Medicare Other | Admitting: General Practice

## 2021-06-20 DIAGNOSIS — J45909 Unspecified asthma, uncomplicated: Secondary | ICD-10-CM

## 2021-06-20 DIAGNOSIS — M159 Polyosteoarthritis, unspecified: Secondary | ICD-10-CM

## 2021-06-20 DIAGNOSIS — I10 Essential (primary) hypertension: Secondary | ICD-10-CM

## 2021-06-20 DIAGNOSIS — G894 Chronic pain syndrome: Secondary | ICD-10-CM

## 2021-06-20 DIAGNOSIS — E782 Mixed hyperlipidemia: Secondary | ICD-10-CM | POA: Diagnosis not present

## 2021-06-20 DIAGNOSIS — M8949 Other hypertrophic osteoarthropathy, multiple sites: Secondary | ICD-10-CM | POA: Diagnosis not present

## 2021-06-20 DIAGNOSIS — R0602 Shortness of breath: Secondary | ICD-10-CM

## 2021-06-20 NOTE — Patient Instructions (Signed)
Visit Information  PATIENT GOALS:  Goals Addressed             This Visit's Progress    RNCM: Manage Chronic Pain right knee       Timeframe:  Short-Term Goal Priority:  High Start Date:        02-07-2021                     Expected End Date:    06-25-2021                   Follow Up Date 08-08-2021   - develop a personal pain management plan - plan exercise or activity when pain is best controlled - prioritize tasks for the day - track times pain is worst and when it is best - track what makes the pain worse and what makes it better - work slower and less intense when having pain  -follow up with specialist on a regular basis    Why is this important?   Day-to-day life can be hard when you have chronic pain.  Pain medicine is just one piece of the treatment puzzle.  You can try these action steps to help you manage your pain.    Notes: The patient is dealing with chronic right knee pain. The patient saw specialist on 01-22-2021. Did not receive medication. Is using "walking stick". 04-18-2021: The patient states he still feels there is fluid on his knee but saw pcp recently and no changes in the plan of care at this time. Will continue to monitor. Remains safe in his environment. 06-20-2021: The patient is having an exacerbation of right knee pain.  He got on the floor looking under the bed and it has been hurting since then. The patient has been putting "liniment" on it. The patient has not taken anything OTC for it. Reminded the patient of the use of Tylenol or Aspirin. The patient states it is feeling better since putting liniment on it. He states that he knows he needs to learn from his mistakes and not get on the floor. Empathetic listening and support given. Will continue to monitor.         Patient verbalizes understanding of instructions provided today and agrees to view in [place].   Telephone follow up appointment with care management team member scheduled for:08-08-2021  at [place] am [place], MSN, [place] Mobile: [phone]

## 2021-06-20 NOTE — Chronic Care Management (AMB) (Signed)
Chronic Care Management   CCM RN Visit Note  06/20/2021 Name: Zachary Brewer MRN: [id number] DOB: [birth year]  Subjective: Zachary Brewer is a 80 y.o. year old male who is a primary care patient of [name], DO. The care management team was consulted for assistance with disease management and care coordination needs.    Engaged with patient by telephone for follow up visit in response to provider referral for case management and/or care coordination services.   Consent to Services:  The patient was given information about Chronic Care Management services, agreed to services, and gave verbal consent prior to initiation of services.  Please see initial visit note for detailed documentation.   Patient agreed to services and verbal consent obtained.   Assessment: Review of patient past medical history, allergies, medications, health status, including review of consultants reports, laboratory and other test data, was performed as part of comprehensive evaluation and provision of chronic care management services.   SDOH (Social Determinants of Health) assessments and interventions performed:  SDOH Interventions    Flowsheet Row Most Recent Value  SDOH Interventions   Physical Activity Interventions Other (Comments)  [No structured activity, does walk in his apartment and around the block. Has to be mindful of shortness of breath]  Social Connections Interventions Other (Comment)  [patient enjoys talking to family and friends on the phone, looks forward to outreaches from the [place]  No Known Allergies  Outpatient Encounter Medications as of 06/20/2021  Medication Sig   ACCU-CHEK AVIVA PLUS test strip USE TO CHECK BLOOD SUGAR ONCE A DAY   Accu-Chek FastClix Lancets MISC Use to check blood sugar up to 1 x weekly   aspirin EC 81 MG tablet Take 81 mg by mouth daily.   Blood Glucose Monitoring Suppl (ACCU-CHEK AVIVA PLUS) w/Device KIT Use glucometer to check  blood sugar 1 x weekly as advised   Calcium Carb-Cholecalciferol (CALCIUM-VITAMIN D) 500-400 MG-UNIT TABS TAKE ONE TABLET BY MOUTH TWICE DAILY WITH MEALS (Patient not taking: No sig reported)   Cholecalciferol (VITAMIN D3) 1000 units CAPS Take 1,000 Units by mouth daily.   enalapril (VASOTEC) 20 MG tablet TAKE ONE TABLET BY MOUTH TWICE DAILY   finasteride (PROSCAR) 5 MG tablet TAKE ONE TABLET BY MOUTH ONCE DAILY   Glucosamine-Chondroit-Vit C-Mn (GLUCOSAMINE 1500 COMPLEX PO) Take 1,500 mg by mouth daily.   Multiple Vitamin (MULTIVITAMIN) tablet Take 1 tablet by mouth daily. Reported on 11/21/2015   omeprazole (PRILOSEC) 20 MG capsule TAKE ONE CAPSULE BY MOUTH TWICE DAILY BEFORE MEALS   psyllium (METAMUCIL) 58.6 % powder Take 1 packet by mouth daily.   tamsulosin (FLOMAX) 0.4 MG CAPS capsule TAKE TWO CAPSULES BY MOUTH EVERY MORNING TAKE AFTER BREAKFAST   No facility-administered encounter medications on file as of 06/20/2021.    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   Gastric outlet obstruction    Swallowing difficulty    Allergic rhinitis 05/30/2015   Osteoarthritis of multiple  joints 05/30/2015   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   Bradycardia 05/30/2015   Essential (primary) hypertension 05/30/2015   Esophagitis, reflux 05/30/2015   Lipoma of neck 05/30/2015   Blood glucose elevated 05/30/2015   Lump in scrotum 05/30/2015    Conditions to be  addressed/monitored:HTN, HLD, Pulmonary Disease, and Right knee pain   Care Plan : RNCM: Hypertension (Adult)  Updates made by Vanita Ingles since 06/20/2021 12:00 AM     Problem: RNCM: Hypertension (Hypertension)   Priority: High     Long-Range Goal: RNCM: Hypertension Monitored   Start Date: 10/18/2020  Expected End Date: 12/14/2021  This Visit's Progress: On track  Recent Progress: On track  Priority: High  Note:   Objective:  Last practice recorded BP readings:  BP Readings from Last 3 Encounters:  04/16/21 (!) 139/56  11/19/20 (!) 143/63  10/09/20 114/60   Most recent eGFR/CrCl: No results found for: EGFR  No components found for: CRCL Current Barriers:  Knowledge Deficits related to basic understanding of hypertension pathophysiology and self care management Knowledge Deficits related to understanding of medications prescribed for management of hypertension Unable to independently manage HTN. Blood pressures up and down. States stable at home. Will continue to monitor. Does not contact provider office for questions/concerns Case Manager Clinical Goal(s):  patient will verbalize understanding of plan for hypertension management patient will attend all scheduled medical appointments: 10-18-2021 at 0900 am patient will demonstrate improved adherence to prescribed treatment plan for hypertension as evidenced by taking all medications as prescribed, monitoring and recording blood pressure as directed, adhering to low sodium/DASH diet patient will demonstrate improved health management independence as evidenced by checking blood pressure as directed and notifying PCP if SBP>150 or DBP > 90, taking all medications as prescribe, and adhering to a low sodium diet as discussed. patient will verbalize basic understanding of hypertension disease process and self health management plan as evidenced by compliance with heart healthy diet, compliance with medications and working with the CCM  team to manage chronic health conditions and reach wellness goals.  Interventions:  Collaboration with [name], DO regarding development and update of comprehensive plan of care as evidenced by provider attestation and co-signature Inter-disciplinary care team collaboration (see longitudinal plan of care) Evaluation of current treatment plan related to hypertension self management and patient's adherence to plan as established by provider.06-20-2021: The patient saw the cardiology and rheumatology recently and had good blood pressures. Denies any issues with HTN management at this time. Provided education to patient re: stroke prevention, s/s of heart attack and stroke, DASH diet, complications of uncontrolled blood pressure. The patient was getting meals on wheels but now is eating in the cafeteria of his apartment building. He usually goes each day around 11 am. States this is working well for him. He denies any new issues with his blood pressures. Saw pcp recently and no changes made in the plan of care.  Reviewed medications with patient and discussed importance of compliance. 06-20-2021: The patient is compliant with his plan of care.  Discussed plans with patient for ongoing care management follow up and provided patient with direct contact information for care management team Advised patient, providing education and rationale, to monitor blood pressure daily and record, calling PCP for findings outside established parameters. 06-20-2021: Denies headaches or issues related to cardiac health. Is eating well, states he is staying away from fried foods.  Reviewed scheduled/upcoming provider appointments including: 10-18-2021 at 0900 am Self-Care Activities: - Self administers medications as  prescribed Attends all scheduled provider appointments Calls provider office for new concerns, questions, or BP outside discussed parameters Checks BP and records as discussed Follows a low sodium  diet/DASH diet Patient Goals: - check blood pressure 3 times per week - choose a place to take my blood pressure (home, clinic or office, retail store) - write blood pressure results in a log or diary - agree on reward when goals are met - agree to work together to make changes - ask questions to understand - have a family meeting to talk about healthy habits - learn about high blood pressure  Follow Up Plan: Telephone follow up appointment with care management team member scheduled for: 08-08-2021 at 0945 am    Task: RNCM: Identify and Monitor Blood Pressure Elevation Completed 06/20/2021  Outcome: Positive  Note:   Care Management Activities:    - blood pressure trends reviewed - depression screen reviewed - home or ambulatory blood pressure monitoring encouraged        Care Plan : RNCM: HLD management  Updates made by Vanita Ingles since 06/20/2021 12:00 AM     Problem: RNCM: HLD Management   Priority: Medium     Long-Range Goal: RNCM: HLD Management   Start Date: 10/18/2021  Expected End Date: 12/14/2021  This Visit's Progress: On track  Recent Progress: On track  Priority: Medium  Note:   Current Barriers:  Poorly controlled hyperlipidemia, complicated by HTN Current antihyperlipidemic regimen: diet controlled  Most recent lipid panel:     Component Value Date/Time   CHOL 138 10/02/2020 0759   TRIG 86 10/02/2020 0759   HDL 47 10/02/2020 0759   CHOLHDL 2.9 10/02/2020 0759   VLDL 28 09/09/2016 0001   LDLCALC 74 10/02/2020 0759   ASCVD risk enhancing conditions: age >80 Does not contact provider office for questions/concerns RN Care Manager Clinical Goal(s):  patient will work with [place], providers, and care team towards execution of optimized self-health management plan patient will verbalize understanding of plan for effective management of HLD  patient will work with [place], pcp and CCM team  to address needs related to HLD patient will take all  medications exactly as prescribed and will call provider for medication related questions patient will attend all scheduled medical appointments: 10-18-2021 at 0900 am the patient will demonstrate ongoing self health care management ability Interventions: Collaboration with [name], DO regarding development and update of comprehensive plan of care as evidenced by provider attestation and co-signature Inter-disciplinary care team collaboration (see longitudinal plan of care) Medication review performed; medication list updated in electronic medical record.  Inter-disciplinary care team collaboration (see longitudinal plan of care) Referred to pharmacy team for assistance with HLD medication management Evaluation of current treatment plan related to HLD and patient's adherence to plan as established by provider. 06-20-2021: The patient is doing well. Is watching what he eats. Trying to stay away from fried foods. States he has been having a little stomach pain but not bad like when he had the flare up with his gallbladder. The patient states he is doing okay and monitoring what his body tells him. Denies any acute distress.  Advised patient to call the office for any concerns or questions.  Reviewed scheduled/upcoming provider appointments including: 10-18-2021  Discussed plans with patient for ongoing care management follow up and provided patient with direct contact information for care management team Patient Goals/Self-Care Activities: - call for medicine refill 2 or 3 days before it runs out -  call if I am sick and can't take my medicine - keep a list of all the medicines I take; vitamins and herbals too - learn to read medicine labels - use a pillbox to sort medicine - use an alarm clock or phone to remind me to take my medicine - change to whole grain breads, cereal, pasta - drink 6 to 8 glasses of water each day - eat 3 to 5 servings of fruits and vegetables each day -  eat 5 or 6 small meals each day - eat fish at least once per week - fill half the plate with nonstarchy vegetables - keep a food diary - limit fast food meals to no more than 1 per week - manage portion size - read food labels for fat, fiber, carbohydrates and portion size - be open to making changes - I can manage, know and watch for signs of a heart attack - if I have chest pain, call for help - learn about small changes that will make a big difference - learn my personal risk factors - barriers to meeting goals identified - change-talk evoked - choices provided - collaboration with team encouraged - decision-making supported - health risks reviewed - problem-solving facilitated - questions answered - readiness for change evaluated - reassurance provided - resources needed to meet goals identified - self-reflection promoted - self-reliance encouraged Follow Up Plan: Telephone follow up appointment with care management team member scheduled for: 08-08-2021 at 0945 am      Task: RNCM: HLD Management Completed 06/20/2021  Outcome: Positive  Note:   Care Management Activities:    - barriers to meeting goals identified - change-talk evoked - choices provided - collaboration with team encouraged - decision-making supported - health risks reviewed - problem-solving facilitated - questions answered - readiness for change evaluated - reassurance provided - resources needed to meet goals identified - self-reflection promoted - self-reliance encouraged        Care Plan : RNCM: Airway hypersensitivity  Updates made by Vanita Ingles since 06/20/2021 12:00 AM     Problem: RNCM: Airway Hypersensitivity   Priority: High     Long-Range Goal: RNCM: Shortness of Breath/ Airway Hypersensitivity Disease Progression Minimized or Managed   Start Date: 10/18/2020  This Visit's Progress: On track  Recent Progress: On track  Priority: High  Note:   Current Barriers:   Knowledge deficits related to basic understanding of shortness of breath and airway hypersensitivity disease process Knowledge deficits related to basic shortness of breath and airway hypersensitivity self care/management Knowledge deficit related to basic understanding of how to use inhalers and how inhaled medications work Knowledge deficit related to importance of energy conservation Limited Social Support Unable to independently manage episodes of shortness of breath with airway hypersensitivity disease process Lacks social connections Does not contact provider office for questions/concerns  Case Manager Clinical Goal(s): patient will report utilizing pursed lip breathing for shortness of breath patient will verbalize understanding of shortness of breath action plan and when to seek appropriate levels of medical care patient will engage in lite exercise as tolerated to build/regain stamina and strength and reduce shortness of breath through activity tolerance patient will verbalize basic understanding of shortness of breath  disease process and self care activities patient will not be hospitalized for shortness of breath exacerbation as evidenced of working with the CCM team to optimize pulmonary health and well being.  Interventions:  Collaboration with [name], DO regarding development and update of comprehensive plan  of care as evidenced by provider attestation and co-signature Inter-disciplinary care team collaboration (see longitudinal plan of care) Provided patient with basic written and verbal shortness of breath and airway hypersensitively  education on self care/management/and exacerbation prevention.  06-20-2021: The patient uses an air purifier during the day and he feels this helps a lot with his shortness of breath. He uses a dehumidifier at night.  He states his breathing is stable at this time and he has not had any exacerbations. Endorses taking medications as  prescribed.  Provided patient with shortness of breath action plan and reinforced importance of daily self assessment. 06-20-2021: The patient is mindful of what his body tells him and pays attention to changes especially in his breathing.  Provided written and verbal instructions on pursed lip breathing and utilized returned demonstration as teach back Provided instruction about proper use of medications used for management of shortness of breath  including inhalers Advised patient to self assesses shortness of breath action plan zone and make appointment with provider if in the yellow zone for 48 hours without improvement. Provided patient with education about the role of exercise in the management of shortness of breath Advised patient to engage in light exercise as tolerated 3-5 days a week Provided education about and advised patient to utilize infection prevention strategies to reduce risk of respiratory infection  Self-Care Activities:  Patient verbalizes understanding of plan to effective management of shortness of breath onset and airway hypersensitivity disease process Self administers medications as prescribed Attends all scheduled provider appointments Calls pharmacy for medication refills Attends church or other social activities Performs ADL's independently Performs IADL's independently Calls provider office for new concerns or questions Patient Goals: - do breathing exercises every day - do breathing exercises at least 2 times each day - do exercises in a comfortable position that makes breathing as easy as possible - develop a new routine to improve sleep - don't eat or exercise right before bedtime - eat healthy - get at least 7 to 8 hours of sleep at night - get outdoors every day (weather permitting) - keep room cool and dark - limit daytime naps - practice relaxation or meditation daily - use a fan or white noise in bedroom - use devices that will help like a cane,  sock-puller or reacher - develop a rescue plan - eliminate symptom triggers at home- has removed all "plug ins" - follow rescue plan if symptoms flare-up - keep follow-up appointments - use an extra pillow to sleep - avoid second hand smoke - eliminate smoking in my home - identify and avoid work-related triggers - identify and remove indoor air pollutants- patient has not used anymore "plug ins", this caused him to have an exacerbation of his shortness of breath back in the spring - limit outdoor activity during cold weather - listen for public air quality announcements every day - activity or exercise based on tolerance encouraged - barriers to treatment managed - breathing techniques encouraged - communicable disease prevention promoted - individualized medical nutrition therapy provided - medication-adherence assessment completed - quality of sleep assessed - rescue (action) plan reviewed - screen for functional limitations completed and reviewed - self-awareness of symptom triggers encouraged  Follow Up Plan: Telephone follow up appointment with care management team member scheduled for: 08-08-2021 at 0945 am    Task: RNCM: Management of shortness of breath Completed 06/20/2021  Outcome: Positive  Note:   Care Management Activities:    - activity or exercise based on  tolerance encouraged - barriers to treatment managed - breathing techniques encouraged - communicable disease prevention promoted - individualized medical nutrition therapy provided - medication-adherence assessment completed - quality of sleep assessed - rescue (action) plan reviewed - screen for functional limitations completed and reviewed - self-awareness of symptom triggers encouraged        Care Plan : RNCM: Chronic Pain (Adult)- right knee pain  Updates made by Vanita Ingles since 06/20/2021 12:00 AM     Problem: RNCM: Pain Management Plan (Chronic Pain)- right knee pain   Priority: High      Long-Range Goal: RNCM: Pain Management Plan Developed   Start Date: 10/18/2020  Expected End Date: 08/16/2021  This Visit's Progress: On track  Recent Progress: On track  Priority: High  Note:   Current Barriers:  Knowledge Deficits related to managing acute/chronic pain Non-adherence to scheduled provider appointments Non-adherence to prescribed medication regimen Chronic Disease Management support and education needs related to chronic pain Unable to independently manage chronic pain to right knee  Nurse Case Manager Clinical Goal(s):  patient will verbalize understanding of plan for managing pain patient will attend all scheduled medical appointments: saw specialist on 05-28-2021 for pain in knee, next pcp on 10-18-2021 at 0900 am patient will demonstrate use of different relaxation  skills and/or diversional activities to assist with pain reduction (distraction, imagery, relaxation, massage, acupressure, TENS, heat, and cold application patient will report pain at a level less than 3 to 4 on a 10-10 rating scale patient will use pharmacological and nonpharmacological pain relief strategies patient will verbalize acceptable level of pain relief and ability to engage in desired activities patient will engage in desired activities without an increase in pain level Interventions:  Collaboration with [place], Devonne Doughty, DO regarding development and update of comprehensive plan of care as evidenced by provider attestation and co-signature Inter-disciplinary care team collaboration (see longitudinal plan of care) - effectiveness of pharmacologic therapy monitored- patient states that the specialist did not give him pain medications. Is taking over the counter medications. 06-20-2021: The patient has used "liniment" the last two days on his right knee for exacerbation of pain. States that he got on the floor looking under his bed for something and it has been bothering him more since then.  Per the specialist note the patient can take Tylenol or ASA for pain relief. The patient states he has not taken anything over the counter and will see how the liniment works. States it is feeling better since using the liniment.  - motivation and barriers to change assessed and addressed. 04-18-2021: The patient states that he still believes he has fluid on his knee but he says some days are better than others. The patient denies any acute distress. Denies falls or safety concerns. 06-20-2021: Has some edema on his knee but not worse than usual. Is using compression socks to help with circulation. Denies any acute distress. Is pacing his activity.  - mutually acceptable comfort goal set - pain assessed. 06-20-2021: States his pain is better since applying the liniment. Denies any acute distress  - pain treatment goals reviewed- 06-20-2021: Saw the specialist on 05-28-2021, feels like he is stable. No changes in the plan of care. States he has good days and bad days with pain  - premedication prior to activity encouraged Evaluation of current treatment plan related to chronic right knee pain  and patient's adherence to plan as established by provider. 04-18-2021: Saw the pcp on 6-21--2022- no changes in the  plan of care at this time. States he is doing well today and denies any acute distress. Takes OTC medications when pain relief needed. Will continue to monitor. 06-20-2021: Having a flare up in his right knee pain today. Denies any acute distress. States he is using "liniment" for pain relief. Knows he can take Tylenol or ASA as needed for pain relief. Will continue to monitor.  Advised patient to call the office for changes in level or intensity of pain  Provided education to patient re: safety and preventing falls in the home and environment.  Reviewed medications with patient and discussed compliance. 06-20-2021: The patient is compliant with medications.  Collaborated with pcp and pharm D regarding ongoing  pain in right knee and recommendations for relief of pain to right knee  Discussed plans with patient for ongoing care management follow up and provided patient with direct contact information for care management team Allow patient to maintain a diary of pain ratings, timing, precipitating events, medications, treatments, and what works best to relieve pain,  Refer to support groups and self-help groups Educate patient about the use of pharmacological interventions for pain management- antianxiety, antidepressants, NSAIDS, opioid analgesics,  Explain the importance of lifestyle modifications to effective pain management  Patient Goals/Self Care Activities:  - mutually acceptable comfort goal set - pain assessed - pain management plan developed - pain treatment goals reviewed - patient response to treatment assessed - sharing of pain management plan with teachers and other caregivers encouraged Self-administers medications as prescribed Attends all scheduled provider appointments Calls pharmacy for medication refills Calls provider office for new concerns or questions Follow Up Plan: Telephone follow up appointment with care management team member scheduled for: 08-08-2021 at 0945 am              Task: RNCM: Partner to Develop Chronic Pain Management Plan Completed 06/20/2021  Outcome: Positive  Note:   Care Management Activities:    - mutually acceptable comfort goal set - pain assessed - pain management plan developed - pain treatment goals reviewed - patient response to treatment assessed - sharing of pain management plan with teachers and other caregivers encouraged        Plan:Telephone follow up appointment with care management team member scheduled for:  08-08-2021 at [place] am  Noreene Larsson RN, MSN, [place] Mobile: [phone]

## 2021-07-03 DIAGNOSIS — H2000 Unspecified acute and subacute iridocyclitis: Secondary | ICD-10-CM | POA: Diagnosis not present

## 2021-07-06 ENCOUNTER — Other Ambulatory Visit: Payer: Self-pay | Admitting: Family Medicine

## 2021-07-06 DIAGNOSIS — I10 Essential (primary) hypertension: Secondary | ICD-10-CM

## 2021-07-06 NOTE — Telephone Encounter (Signed)
Requested medication (s) are due for refill today: yes  Requested medication (s) are on the active medication list: yes  Last refill:  04/08/21  Future visit scheduled: yes  Notes to clinic:  overdue lab work    Requested Prescriptions  Pending Prescriptions Disp Refills   enalapril (VASOTEC) 20 MG tablet [Pharmacy Med Name: enalapril maleate 20 mg tablet] 180 tablet 1    Sig: TAKE ONE TABLET BY MOUTH TWICE DAILY     Cardiovascular:  ACE Inhibitors Failed - 07/06/2021  9:35 AM      Failed - Cr in normal range and within 180 days    Creat  Date Value Ref Range Status  10/02/2020 0.82 0.70 - 1.11 mg/dL Final    Comment:    For patients >15 years of age, the reference limit for Creatinine is approximately 13% higher for people identified as African-American. .           Failed - K in normal range and within 180 days    Potassium  Date Value Ref Range Status  10/02/2020 3.9 3.5 - 5.3 mmol/L Final  11/27/2013 3.7 3.5 - 5.1 mmol/L Final          Passed - Patient is not pregnant      Passed - Last BP in normal range    BP Readings from Last 1 Encounters:  04/16/21 (!) 139/56          Passed - Valid encounter within last 6 months    Recent Outpatient Visits           2 months ago Essential (primary) hypertension   [facility] [place], Devonne Doughty, DO   7 months ago Essential (primary) hypertension   [facility] [name], DO   9 months ago Annual physical exam   [facility] [name], DO   1 year ago Essential (primary) hypertension   [place], [place]   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   In 7 months  [facility], [facility]

## 2021-07-11 DIAGNOSIS — H2000 Unspecified acute and subacute iridocyclitis: Secondary | ICD-10-CM | POA: Diagnosis not present

## 2021-07-12 ENCOUNTER — Other Ambulatory Visit: Payer: Self-pay | Admitting: Family Medicine

## 2021-07-12 DIAGNOSIS — K2100 Gastro-esophageal reflux disease with esophagitis, without bleeding: Secondary | ICD-10-CM

## 2021-07-25 DIAGNOSIS — H2000 Unspecified acute and subacute iridocyclitis: Secondary | ICD-10-CM | POA: Diagnosis not present

## 2021-08-08 ENCOUNTER — Ambulatory Visit (INDEPENDENT_AMBULATORY_CARE_PROVIDER_SITE_OTHER): Payer: Medicare Other

## 2021-08-08 ENCOUNTER — Telehealth: Payer: Medicare Other | Admitting: General Practice

## 2021-08-08 DIAGNOSIS — I10 Essential (primary) hypertension: Secondary | ICD-10-CM

## 2021-08-08 DIAGNOSIS — R0602 Shortness of breath: Secondary | ICD-10-CM

## 2021-08-08 DIAGNOSIS — M159 Polyosteoarthritis, unspecified: Secondary | ICD-10-CM

## 2021-08-08 DIAGNOSIS — J45909 Unspecified asthma, uncomplicated: Secondary | ICD-10-CM

## 2021-08-08 DIAGNOSIS — E782 Mixed hyperlipidemia: Secondary | ICD-10-CM

## 2021-08-08 DIAGNOSIS — G894 Chronic pain syndrome: Secondary | ICD-10-CM

## 2021-08-08 NOTE — Patient Instructions (Signed)
Visit Information  PATIENT GOALS:  Goals Addressed             This Visit's Progress    RNCM: Eye infection       Follow up on 10-10-2021  08-08-2021: The patient has been dealing with an eye infection in his left eye for about 6 weeks. He is seeing Dr. Edison Pace and has a follow up on 08-14-2021. He is using medicated drops in his eyes. Denies pain in his eyes now and denies any vision loss during the course of the treatment. Will continue to monitor for changes.  Patient Will: *Call the office for changes in vision, pain in eye, new questions, or concerns *Take medications as prescribed *Keep follow up appointments.      RNCM: Manage Chronic Pain right knee       Timeframe:  Short-Term Goal Priority:  High Start Date:        02-07-2021                     Expected End Date:    06-25-2021                   Follow Up Date 10-10-2021   - develop a personal pain management plan - plan exercise or activity when pain is best controlled - prioritize tasks for the day - track times pain is worst and when it is best - track what makes the pain worse and what makes it better - work slower and less intense when having pain  -follow up with specialist on a regular basis    Why is this important?   Day-to-day life can be hard when you have chronic pain.  Pain medicine is just one piece of the treatment puzzle.  You can try these action steps to help you manage your pain.    Notes: The patient is dealing with chronic right knee pain. The patient saw specialist on 01-22-2021. Did not receive medication. Is using "walking stick". 04-18-2021: The patient states he still feels there is fluid on his knee but saw pcp recently and no changes in the plan of care at this time. Will continue to monitor. Remains safe in his environment. 06-20-2021: The patient is having an exacerbation of right knee pain.  He got on the floor looking under the bed and it has been hurting since then. The patient has been  putting "liniment" on it. The patient has not taken anything OTC for it. Reminded the patient of the use of Tylenol or Aspirin. The patient states it is feeling better since putting liniment on it. He states that he knows he needs to learn from his mistakes and not get on the floor. Empathetic listening and support given. Will continue to monitor. 08-08-2021: The patient states that he is doing about the same and no acute changes in his pain and discomfort. He denies any falls. Is safe in his home environment. Continues to drive and able to do his activities. Will continue to monitor.         Patient verbalizes understanding of instructions provided today and agrees to view in [place].   Telephone follow up appointment with care management team member scheduled for: 10-10-2021 at [place] am  Noreene Larsson RN, MSN, [place] Mobile: [phone]

## 2021-08-08 NOTE — Chronic Care Management (AMB) (Signed)
Chronic Care Management   CCM RN Visit Note  08/08/2021 Name: Zachary Brewer MRN: [id number] DOB: 1941/10/12  Subjective: Zachary Brewer is a 80 y.o. year old male who is a primary care patient of [name], DO. The care management team was consulted for assistance with disease management and care coordination needs.    Engaged with patient by telephone for follow up visit in response to provider referral for case management and/or care coordination services.   Consent to Services:  The patient was given information about Chronic Care Management services, agreed to services, and gave verbal consent prior to initiation of services.  Please see initial visit note for detailed documentation.   Patient agreed to services and verbal consent obtained.   Assessment: Review of patient past medical history, allergies, medications, health status, including review of consultants reports, laboratory and other test data, was performed as part of comprehensive evaluation and provision of chronic care management services.   SDOH (Social Determinants of Health) assessments and interventions performed:    CCM Care Plan  No Known Allergies  Outpatient Encounter Medications as of 08/08/2021  Medication Sig   ACCU-CHEK AVIVA PLUS test strip USE TO CHECK BLOOD SUGAR ONCE A DAY   Accu-Chek FastClix Lancets MISC Use to check blood sugar up to 1 x weekly   aspirin EC 81 MG tablet Take 81 mg by mouth daily.   Blood Glucose Monitoring Suppl (ACCU-CHEK AVIVA PLUS) w/Device KIT Use glucometer to check blood sugar 1 x weekly as advised   Calcium Carb-Cholecalciferol (CALCIUM-VITAMIN D) 500-400 MG-UNIT TABS TAKE ONE TABLET BY MOUTH TWICE DAILY WITH MEALS (Patient not taking: No sig reported)   Cholecalciferol (VITAMIN D3) 1000 units CAPS Take 1,000 Units by mouth daily.   enalapril (VASOTEC) 20 MG tablet TAKE ONE TABLET BY MOUTH TWICE DAILY   finasteride (PROSCAR) 5 MG tablet TAKE ONE TABLET BY MOUTH  ONCE DAILY   Glucosamine-Chondroit-Vit C-Mn (GLUCOSAMINE 1500 COMPLEX PO) Take 1,500 mg by mouth daily.   Multiple Vitamin (MULTIVITAMIN) tablet Take 1 tablet by mouth daily. Reported on 11/21/2015   omeprazole (PRILOSEC) 20 MG capsule TAKE ONE CAPSULE BY MOUTH TWICE DAILY BEFORE MEALS   psyllium (METAMUCIL) 58.6 % powder Take 1 packet by mouth daily.   tamsulosin (FLOMAX) 0.4 MG CAPS capsule TAKE TWO CAPSULES BY MOUTH EVERY MORNING TAKE AFTER BREAKFAST   No facility-administered encounter medications on file as of 08/08/2021.    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   Gastric outlet obstruction    Swallowing difficulty    Allergic rhinitis 05/30/2015   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/30/2015   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   Bradycardia 05/30/2015   Essential (primary) hypertension 05/30/2015   Esophagitis, reflux 05/30/2015   Lipoma of neck 05/30/2015   Blood glucose elevated 05/30/2015   Lump in scrotum 05/30/2015    Conditions to be addressed/monitored:HTN, HLD, Pulmonary Disease, and chronic pain in right knee, left eye infection, Airway hypersensitivity   Care Plan : RNCM: Hypertension (Adult)  Updates made by Vanita Ingles, RN since 08/08/2021 12:00 AM     Problem: RNCM: Hypertension (Hypertension)   Priority: High     Long-Range Goal: RNCM: Hypertension Monitored   Start Date: 10/18/2020  Expected End Date: 12/14/2021   This Visit's Progress: On track  Recent Progress: On track  Priority: High  Note:   Objective:  Last practice recorded BP readings:  BP Readings from Last 3 Encounters:  08/08/21 128/83  04/16/21 (!) 139/56  11/19/20 (!) 143/63   Most recent eGFR/CrCl: No results found for: EGFR  No components found for: CRCL Current Barriers:  Knowledge Deficits related to basic understanding of hypertension pathophysiology and self care management Knowledge Deficits related to understanding of medications prescribed for management of hypertension Unable to independently manage HTN. Blood pressures up and down. States stable at home. Will continue to monitor. Does not contact provider office for questions/concerns Case Manager Clinical Goal(s):  patient will verbalize understanding of plan for hypertension management patient will attend all scheduled medical appointments: 10-18-2021 at 0900 am patient will demonstrate improved adherence to prescribed treatment plan for hypertension as evidenced by taking all medications as prescribed, monitoring and recording blood pressure as directed, adhering to low sodium/DASH diet patient will demonstrate improved health management independence as evidenced by checking blood pressure as directed and notifying PCP if SBP>150 or DBP > 90, taking all medications as prescribe, and adhering to a low sodium diet as discussed. patient will verbalize basic understanding of hypertension disease process and self health management plan as evidenced by compliance with heart healthy diet, compliance with medications and working with the CCM team to manage chronic health conditions and reach wellness goals.  Interventions:  Collaboration with [name], DO regarding development and update of comprehensive plan of care as evidenced by provider attestation and co-signature Inter-disciplinary care team collaboration (see longitudinal plan of care) Evaluation of  current treatment plan related to hypertension self management and patient's adherence to plan as established by provider.06-20-2021: The patient saw the cardiology and rheumatology recently and had good blood pressures. Denies any issues with HTN management at this time. 08-08-2021: The patient is doing well with his HTN. Does have some variations with his blood pressures but denies any acute findings. States his blood pressures do go up depending on what he eats. Denies any acute distress.  Provided education to patient re: stroke prevention, s/s of heart attack and stroke, DASH diet, complications of uncontrolled blood pressure. The patient was getting meals on wheels but now is eating in the cafeteria of his apartment building. He usually goes each day around 11 am. States this is working well for him. He denies any new issues with his blood pressures. Saw pcp recently and no changes made in the plan of care. 08-08-2021: Is eating good. States he doesn't really get thirsty and he is trying to drink more water.  Reviewed medications with patient and discussed importance of compliance. 08-08-2021: The patient is compliant with his plan of care.  Discussed plans with patient for ongoing care management follow up and provided patient with direct contact information for care management team Advised patient, providing education and rationale, to monitor blood pressure daily and record, calling PCP for findings outside established parameters. 06-20-2021: Denies headaches or issues related to cardiac health. Is eating well, states he is staying away from fried foods. 08-08-2021: States that his blood pressure this am was 128/83 with pulse of 48. Weight is 142.2. He states his blood pressure  does go up and down sometimes depending on what he eats. Has had some readings of 145/127 and 123/97. Education and support given. The patient knows the parameters of systolic blood pressure less that 657 and diastolic <90. Did  receive the flu vaccine and he thinks it was on 06-29-2021. They gave it to him at his apartment. He states he will bring the paperwork with him to the MD appointment in December for documentation.  Reviewed scheduled/upcoming provider appointments including: 10-18-2021 at 0900 am Self-Care Activities: - Self administers medications as prescribed Attends all scheduled provider appointments Calls provider office for new concerns, questions, or BP outside discussed parameters Checks BP and records as discussed Follows a low sodium diet/DASH diet Patient Goals: - check blood pressure 3 times per week - choose a place to take my blood pressure (home, clinic or office, retail store) - write blood pressure results in a log or diary - agree on reward when goals are met - agree to work together to make changes - ask questions to understand - have a family meeting to talk about healthy habits - learn about high blood pressure  Follow Up Plan: Telephone follow up appointment with care management team member scheduled for: 10-10-2021 at 0945 am    Care Plan : RNCM: HLD management  Updates made by Vanita Ingles, RN since 08/08/2021 12:00 AM     Problem: RNCM: HLD Management   Priority: Medium     Long-Range Goal: RNCM: HLD Management   Start Date: 10/18/2021  Expected End Date: 12/14/2021  This Visit's Progress: On track  Recent Progress: On track  Priority: Medium  Note:   Current Barriers:  Poorly controlled hyperlipidemia, complicated by HTN Current antihyperlipidemic regimen: diet controlled  Most recent lipid panel:     Component Value Date/Time   CHOL 138 10/02/2020 0759   TRIG 86 10/02/2020 0759   HDL 47 10/02/2020 0759   CHOLHDL 2.9 10/02/2020 0759   VLDL 28 09/09/2016 0001   LDLCALC 74 10/02/2020 0759  Will have lab work in December  ASCVD risk enhancing conditions: age >74 Does not contact provider office for questions/concerns RN Care Manager Clinical Goal(s):   patient will work with Consulting civil engineer, providers, and care team towards execution of optimized self-health management plan patient will verbalize understanding of plan for effective management of HLD  patient will work with RNCM, pcp and CCM team  to address needs related to HLD patient will take all medications exactly as prescribed and will call provider for medication related questions patient will attend all scheduled medical appointments: 10-18-2021 at 0900 am the patient will demonstrate ongoing self health care management ability Interventions: Collaboration with [name], DO regarding development and update of comprehensive plan of care as evidenced by provider attestation and co-signature Inter-disciplinary care team collaboration (see longitudinal plan of care) Medication review performed; medication list updated in electronic medical record. 08-08-2021: The patient is compliant with medications Inter-disciplinary care team collaboration (see longitudinal plan of care) Referred to pharmacy team for assistance with HLD medication management Evaluation of current treatment plan related to HLD and patient's adherence to plan as established by provider. 06-20-2021: The patient is doing well. Is watching what he eats. Trying to stay away from fried foods. States he has been having a little stomach pain but not bad like when he had the flare up with his gallbladder. The patient states he is doing okay and monitoring what his body tells him. Denies any acute distress.  08-08-2021: The patient is eating well. Denies any further stomach discomfort. States he feels he is stable to be 80 years old.  Advised patient to call the office for any concerns or questions.  Reviewed scheduled/upcoming provider appointments including: 10-18-2021  Discussed plans with patient for ongoing care management follow up and provided patient with direct contact information for care management  team Patient Goals/Self-Care Activities: - call for medicine refill 2 or 3 days before it runs out - call if I am sick and can't take my medicine - keep a list of all the medicines I take; vitamins and herbals too - learn to read medicine labels - use a pillbox to sort medicine - use an alarm clock or phone to remind me to take my medicine - change to whole grain breads, cereal, pasta - drink 6 to 8 glasses of water each day - eat 3 to 5 servings of fruits and vegetables each day - eat 5 or 6 small meals each day - eat fish at least once per week - fill half the plate with nonstarchy vegetables - keep a food diary - limit fast food meals to no more than 1 per week - manage portion size - read food labels for fat, fiber, carbohydrates and portion size - be open to making changes - I can manage, know and watch for signs of a heart attack - if I have chest pain, call for help - learn about small changes that will make a big difference - learn my personal risk factors - barriers to meeting goals identified - change-talk evoked - choices provided - collaboration with team encouraged - decision-making supported - health risks reviewed - problem-solving facilitated - questions answered - readiness for change evaluated - reassurance provided - resources needed to meet goals identified - self-reflection promoted - self-reliance encouraged Follow Up Plan: Telephone follow up appointment with care management team member scheduled for: 10-10-2021 at 0945 am      Care Plan : RNCM: Airway hypersensitivity  Updates made by Vanita Ingles, RN since 08/08/2021 12:00 AM     Problem: RNCM: Airway Hypersensitivity   Priority: High     Long-Range Goal: RNCM: Shortness of Breath/ Airway Hypersensitivity Disease Progression Minimized or Managed   Start Date: 10/18/2020  This Visit's Progress: On track  Recent Progress: On track  Priority: High  Note:   Current Barriers:  Knowledge  deficits related to basic understanding of shortness of breath and airway hypersensitivity disease process Knowledge deficits related to basic shortness of breath and airway hypersensitivity self care/management Knowledge deficit related to basic understanding of how to use inhalers and how inhaled medications work Knowledge deficit related to importance of energy conservation Limited Social Support Unable to independently manage episodes of shortness of breath with airway hypersensitivity disease process Lacks social connections Does not contact provider office for questions/concerns  Case Manager Clinical Goal(s): patient will report utilizing pursed lip breathing for shortness of breath patient will verbalize understanding of shortness of breath action plan and when to seek appropriate levels of medical care patient will engage in lite exercise as tolerated to build/regain stamina and strength and reduce shortness of breath through activity tolerance patient will verbalize basic understanding of shortness of breath  disease process and self care activities patient will not be hospitalized for shortness of breath exacerbation as evidenced of working with the CCM team to optimize pulmonary health and well being.  Interventions:  Collaboration with [name], DO regarding development and  update of comprehensive plan of care as evidenced by provider attestation and co-signature Inter-disciplinary care team collaboration (see longitudinal plan of care) Provided patient with basic written and verbal shortness of breath and airway hypersensitively  education on self care/management/and exacerbation prevention.  06-20-2021: The patient uses an air purifier during the day and he feels this helps a lot with his shortness of breath. He uses a dehumidifier at night.  He states his breathing is stable at this time and he has not had any exacerbations. Endorses taking medications as prescribed.  08-08-2021: The patient states his breathing is stable. Still coughs up thick sputum and states that it is clear. Review of changes in color and alerting the pcp for changes. He denies any colorations of brown or green. Education on staying hydrated and this helping with thinning secretions. States he has to clear secretions often during the day and at night sometimes. Endorses sleeping well. Has a new mattress but has it broke in pretty good now.  Provided patient with shortness of breath action plan and reinforced importance of daily self assessment. 06-20-2021: The patient is mindful of what his body tells him and pays attention to changes especially in his breathing. 08-08-2021: Denies any issues with weather changes Provided written and verbal instructions on pursed lip breathing and utilized returned demonstration as teach back Provided instruction about proper use of medications used for management of shortness of breath  including inhalers Advised patient to self assesses shortness of breath action plan zone and make appointment with provider if in the yellow zone for 48 hours without improvement. Provided patient with education about the role of exercise in the management of shortness of breath Advised patient to engage in light exercise as tolerated 3-5 days a week Provided education about and advised patient to utilize infection prevention strategies to reduce risk of respiratory infection. 08-08-2021: Review and education done with the patient today on sx and sx of infections Self-Care Activities:  Patient verbalizes understanding of plan to effective management of shortness of breath onset and airway hypersensitivity disease process Self administers medications as prescribed Attends all scheduled provider appointments Calls pharmacy for medication refills Attends church or other social activities Performs ADL's independently Performs IADL's independently Calls provider office for new  concerns or questions Patient Goals: - do breathing exercises every day - do breathing exercises at least 2 times each day - do exercises in a comfortable position that makes breathing as easy as possible - develop a new routine to improve sleep - don't eat or exercise right before bedtime - eat healthy - get at least 7 to 8 hours of sleep at night - get outdoors every day (weather permitting) - keep room cool and dark - limit daytime naps - practice relaxation or meditation daily - use a fan or white noise in bedroom - use devices that will help like a cane, sock-puller or reacher - develop a rescue plan - eliminate symptom triggers at home- has removed all "plug ins" - follow rescue plan if symptoms flare-up - keep follow-up appointments - use an extra pillow to sleep - avoid second hand smoke - eliminate smoking in my home - identify and avoid work-related triggers - identify and remove indoor air pollutants- patient has not used anymore "plug ins", this caused him to have an exacerbation of his shortness of breath back in the spring - limit outdoor activity during cold weather - listen for public air quality announcements every day - activity or exercise based  on tolerance encouraged - barriers to treatment managed - breathing techniques encouraged - communicable disease prevention promoted - individualized medical nutrition therapy provided - medication-adherence assessment completed - quality of sleep assessed - rescue (action) plan reviewed - screen for functional limitations completed and reviewed - self-awareness of symptom triggers encouraged  Follow Up Plan: Telephone follow up appointment with care management team member scheduled for: 10-10-2021 at [place] am    Care Plan : RNCM: Chronic Pain (Adult)- right knee pain  Updates made by Vanita Ingles, RN since 08/08/2021 12:00 AM     Problem: RNCM: Pain Management Plan (Chronic Pain)- right knee pain   Priority:  High     Long-Range Goal: RNCM: Pain Management Plan Developed   Start Date: 10/18/2020  Expected End Date: 08/16/2021  This Visit's Progress: On track  Recent Progress: On track  Priority: High  Note:   Current Barriers:  Knowledge Deficits related to managing acute/chronic pain Non-adherence to scheduled provider appointments Non-adherence to prescribed medication regimen Chronic Disease Management support and education needs related to chronic pain Unable to independently manage chronic pain to right knee  Nurse Case Manager Clinical Goal(s):  patient will verbalize understanding of plan for managing pain patient will attend all scheduled medical appointments: next pcp on 10-18-2021 at 0900 am patient will demonstrate use of different relaxation  skills and/or diversional activities to assist with pain reduction (distraction, imagery, relaxation, massage, acupressure, TENS, heat, and cold application patient will report pain at a level less than 3 to 4 on a 10-10 rating scale patient will use pharmacological and nonpharmacological pain relief strategies patient will verbalize acceptable level of pain relief and ability to engage in desired activities patient will engage in desired activities without an increase in pain level Interventions:  Collaboration with Parks Ranger, Devonne Doughty, DO regarding development and update of comprehensive plan of care as evidenced by provider attestation and co-signature Inter-disciplinary care team collaboration (see longitudinal plan of care) - effectiveness of pharmacologic therapy monitored- patient states that the specialist did not give him pain medications. Is taking over the counter medications. 06-20-2021: The patient has used "liniment" the last two days on his right knee for exacerbation of pain. States that he got on the floor looking under his bed for something and it has been bothering him more since then. Per the specialist note the patient  can take Tylenol or ASA for pain relief. The patient states he has not taken anything over the counter and will see how the liniment works. States it is feeling better since using the liniment. 08-08-2021: Denies any acute findings. Still using the liniment on his knee and using his cane when walking. Denies any falls or other issues at this time. Will continue to monitor.  - motivation and barriers to change assessed and addressed. 04-18-2021: The patient states that he still believes he has fluid on his knee but he says some days are better than others. The patient denies any acute distress. Denies falls or safety concerns. 06-20-2021: Has some edema on his knee but not worse than usual. Is using compression socks to help with circulation. Denies any acute distress. Is pacing his activity. 08-08-2021: Denies any edema in his knee today. Continue to follow plan of care for management of his arthritis pain and discomfort.  - mutually acceptable comfort goal set - pain assessed. 06-20-2021: States his pain is better since applying the liniment. Denies any acute distress  - pain treatment goals reviewed- 08-08-2021: Saw the specialist  on 05-28-2021, feels like he is stable. No changes in the plan of care. States he has good days and bad days with pain  - premedication prior to activity encouraged Evaluation of current treatment plan related to chronic right knee pain  and patient's adherence to plan as established by provider. 04-18-2021: Saw the pcp on 6-21--2022- no changes in the plan of care at this time. States he is doing well today and denies any acute distress. Takes OTC medications when pain relief needed. Will continue to monitor. 06-20-2021: Having a flare up in his right knee pain today. Denies any acute distress. States he is using "liniment" for pain relief. Knows he can take Tylenol or ASA as needed for pain relief. Will continue to monitor. 08-08-2021: Denies any acute distress at this time. States that  his pain level is stable. Will continue to monitor.  Advised patient to call the office for changes in level or intensity of pain  Provided education to patient re: safety and preventing falls in the home and environment.  Reviewed medications with patient and discussed compliance. 08-08-2021: The patient is compliant with medications.  Collaborated with pcp and pharm D regarding ongoing pain in right knee and recommendations for relief of pain to right knee  Discussed plans with patient for ongoing care management follow up and provided patient with direct contact information for care management team Allow patient to maintain a diary of pain ratings, timing, precipitating events, medications, treatments, and what works best to relieve pain,  Refer to support groups and self-help groups Educate patient about the use of pharmacological interventions for pain management- antianxiety, antidepressants, NSAIDS, opioid analgesics,  Explain the importance of lifestyle modifications to effective pain management  Patient Goals/Self Care Activities:  - mutually acceptable comfort goal set - pain assessed - pain management plan developed - pain treatment goals reviewed - patient response to treatment assessed - sharing of pain management plan with teachers and other caregivers encouraged Self-administers medications as prescribed Attends all scheduled provider appointments Calls pharmacy for medication refills Calls provider office for new concerns or questions Follow Up Plan: Telephone follow up appointment with care management team member scheduled for: 10-10-2021 at 0945 am               Plan:Telephone follow up appointment with care management team member scheduled for:  10-10-2021 at [place] am  Noreene Larsson RN, MSN, [facility] Mobile: [phone]

## 2021-08-14 DIAGNOSIS — H2000 Unspecified acute and subacute iridocyclitis: Secondary | ICD-10-CM | POA: Diagnosis not present

## 2021-08-26 DIAGNOSIS — E782 Mixed hyperlipidemia: Secondary | ICD-10-CM | POA: Diagnosis not present

## 2021-08-26 DIAGNOSIS — I10 Essential (primary) hypertension: Secondary | ICD-10-CM | POA: Diagnosis not present

## 2021-08-26 DIAGNOSIS — M159 Polyosteoarthritis, unspecified: Secondary | ICD-10-CM | POA: Diagnosis not present

## 2021-08-26 DIAGNOSIS — J45909 Unspecified asthma, uncomplicated: Secondary | ICD-10-CM | POA: Diagnosis not present

## 2021-10-10 ENCOUNTER — Ambulatory Visit (INDEPENDENT_AMBULATORY_CARE_PROVIDER_SITE_OTHER): Payer: Medicare Other

## 2021-10-10 ENCOUNTER — Telehealth: Payer: Medicare Other

## 2021-10-10 DIAGNOSIS — M79641 Pain in right hand: Secondary | ICD-10-CM

## 2021-10-10 DIAGNOSIS — R0602 Shortness of breath: Secondary | ICD-10-CM

## 2021-10-10 DIAGNOSIS — J45909 Unspecified asthma, uncomplicated: Secondary | ICD-10-CM

## 2021-10-10 DIAGNOSIS — G894 Chronic pain syndrome: Secondary | ICD-10-CM

## 2021-10-10 DIAGNOSIS — M159 Polyosteoarthritis, unspecified: Secondary | ICD-10-CM

## 2021-10-10 DIAGNOSIS — E782 Mixed hyperlipidemia: Secondary | ICD-10-CM

## 2021-10-10 DIAGNOSIS — I10 Essential (primary) hypertension: Secondary | ICD-10-CM

## 2021-10-10 NOTE — Patient Instructions (Signed)
Visit Information  Thank you for taking time to visit with me today. Please don't hesitate to contact me if I can be of assistance to you before our next scheduled telephone appointment.  Following are the goals we discussed today:  RNCM Clinical Goal(s):  Patient will verbalize basic understanding of HTN, HLD, Pulmonary Disease, and Chronic Pain disease process and self health management plan as evidenced by stable blood pressures, labs wnl, taking medications as directed, compliance with plan of care and working with the CCM team to effectively mange health and well being take all medications exactly as prescribed and will call provider for medication related questions as evidenced by compliance and calling for refills before running out    attend all scheduled medical appointments: 10-18-2021 as evidenced by keeping appointments and calling for needed schedule changes         demonstrate improved and ongoing adherence to prescribed treatment plan for HTN, HLD, Pulmonary Disease, and Chronic pain  as evidenced by stable conditions, no exacerbations of pain, VS WNL demonstrate a decrease in Osteoarthritis exacerbations  as evidenced by controlled pain in right knee and hand pain  demonstrate ongoing self health care management ability for effective management of chronic conditions as  as evidenced by working with the CCM team through collaboration with Consulting civil engineer, provider, and care team.    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.) Long Term Goal  Reviewed medications with patient, including  use of prescribed maintenance and rescue inhalers, and provided instruction on medication management and the importance of adherence Provided patient with basic written and verbal airway hypersensitivity  education on self care/management/and exacerbation prevention Advised patient to track and manage hypersensitivity triggers Provided written and verbal instructions on pursed lip breathing and utilized returned demonstration as teach back Provided instruction about proper use of medications used for management of airway hypersensitivity  including inhalers Advised patient to self assesses airway hypersensitivity  action plan zone and make appointment with provider if in the yellow zone for 48 hours without improvement Advised patient to engage in light exercise as tolerated 3-5 days a week to aid in the the management of Airway hypersensitivity  Provided education about and advised patient to utilize infection prevention strategies to reduce risk of respiratory infection Discussed the importance of adequate rest and management of fatigue with Airway Hypersensitivity  Screening for signs and symptoms of depression related to chronic disease state  Assessed social determinant of health barriers   Hyperlipidemia:  (Status: Goal on Track (progressing): YES.) Long Term Goal       Lab Results  Component Value Date    CHOL 138 10/02/2020    HDL 47 10/02/2020    LDLCALC 74 10/02/2020    TRIG 86 10/02/2020    CHOLHDL 2.9 10/02/2020      Medication review performed; medication list updated in electronic medical record.  Provider established cholesterol goals reviewed; Counseled on importance of regular laboratory monitoring as prescribed; Provided HLD educational materials; Reviewed role and benefits of statin for ASCVD risk reduction; Discussed strategies to manage statin-induced myalgias; Reviewed importance of limiting foods high in cholesterol;   Hypertension: (Status: Goal on Track  (  progressing): YES.) Last practice recorded BP readings:     BP Readings from Last 3 Encounters:  08/08/21 128/83  04/16/21 (!) 139/56  11/19/20 (!) 143/63  Most recent eGFR/CrCl: No results found for: EGFR  No components found for: CRCL   Evaluation of current treatment plan related to hypertension self management and patient's adherence to plan as established by provider;   Provided education to patient re: stroke prevention, s/s of heart attack and stroke; Reviewed prescribed diet heart healthy Reviewed medications with patient and discussed importance of compliance;  Discussed plans with patient for ongoing care management follow up and provided patient with direct contact information for care management team; Advised patient, providing education and rationale, to monitor blood pressure daily and record, calling PCP for findings outside established parameters. 10-10-2021: The patient was worried about some of his blood pressure readings. The patients reading this am was 112/68. The patient states that he doesn't like it when top number is 140's. Education on goal of systolic of <811 and diastolic <91. Review of low blood pressures and to monitor for orthostatic hypotension. The patient verbalized feeling better since having some parameters to go by. Education and support given.  Advised patient to discuss blood pressure trends  with provider; Provided education on prescribed diet heart healthy;  Discussed complications of poorly controlled blood pressure such as heart disease, stroke, circulatory complications, vision complications, kidney impairment, sexual dysfunction;    Pain:  (Status: Goal on Track (progressing): YES.) Long Term Goal  Pain assessment performed. Patient states no pain today. He has to monitor for changes in his right knee and right hand. He knows when he has overdone it. Medications reviewed Reviewed provider established plan for pain management; Discussed importance  of adherence to all scheduled medical appointments; Counseled on the importance of reporting any/all new or changed pain symptoms or management strategies to pain management provider; Advised patient to report to care team affect of pain on daily activities; Discussed use of relaxation techniques and/or diversional activities to assist with pain reduction (distraction, imagery, relaxation, massage, acupressure, TENS, heat, and cold application; Reviewed with patient prescribed pharmacological and nonpharmacological pain relief strategies; Advised patient to discuss unresolved pain, changes in level or intensity of pain with provider;   Patient Goals/Self-Care Activities: Take medications as prescribed   Attend all scheduled provider appointments Call pharmacy for medication refills 3-7 days in advance of running out of medications Attend church or other social activities Perform all self care activities independently  Perform IADL's (shopping, preparing meals, housekeeping, managing finances) independently Call provider office for new concerns or questions  Work with the social worker to address care coordination needs and will continue to work with the clinical team to address health care and disease management related needs call the Suicide and Crisis Lifeline: 988 call the [place] National Suicide Prevention Lifeline: 480-815-1308 or TTY: (930) 811-1785 TTY (838) 611-3131) to talk to a trained counselor call 1-800-273-TALK (toll free, 24 hour hotline) if experiencing a Mental Health or [place]  check blood pressure daily choose a place to take my blood pressure (home, clinic or office, retail store) write blood pressure results in a log or diary learn about high blood pressure keep a blood pressure log take blood pressure log to all doctor appointments call doctor for signs and symptoms of high blood pressure develop an action plan for high blood pressure keep all doctor  appointments take medications for blood pressure exactly as prescribed begin an exercise program report new symptoms  to your doctor eat more whole grains, fruits and vegetables, lean meats and healthy fats - call for medicine refill 2 or 3 days before it runs out - take all medications exactly as prescribed - call doctor with any symptoms you believe are related to your medicine - call doctor when you experience any new symptoms - go to all doctor appointments as scheduled - adhere to prescribed diet: Heart healthy diet   Our next appointment is by telephone on 12-19-2021 at 1030 am  Please call the care guide team at [phone] if you need to cancel or reschedule your appointment.   If you are experiencing a Mental Health or [place] or need someone to talk to, please call the Suicide and Crisis Lifeline: 988 call the [place] National Suicide Prevention Lifeline: 617-499-3525 or TTY: 806-279-1086 TTY 571-171-2256) to talk to a trained counselor call 1-800-273-TALK (toll free, 24 hour hotline)   Patient verbalizes understanding of instructions provided today and agrees to view in [place].   Noreene Larsson RN, MSN, [place] Mobile: [phone]

## 2021-10-10 NOTE — Chronic Care Management (AMB) (Signed)
Chronic Care Management   CCM RN Visit Note  10/10/2021 Name: Zachary Brewer MRN: [id number] DOB: 01/27/1941  Subjective: Zachary Brewer is a 80 y.o. year old male who is a primary care patient of [name], DO. The care management team was consulted for assistance with disease management and care coordination needs.    Engaged with patient by telephone for follow up visit in response to provider referral for case management and/or care coordination services.   Consent to Services:  The patient was given information about Chronic Care Management services, agreed to services, and gave verbal consent prior to initiation of services.  Please see initial visit note for detailed documentation.   Patient agreed to services and verbal consent obtained.   Assessment: Review of patient past medical history, allergies, medications, health status, including review of consultants reports, laboratory and other test data, was performed as part of comprehensive evaluation and provision of chronic care management services.   SDOH (Social Determinants of Health) assessments and interventions performed:    CCM Care Plan  No Known Allergies  Outpatient Encounter Medications as of 10/10/2021  Medication Sig   ACCU-CHEK AVIVA PLUS test strip USE TO CHECK BLOOD SUGAR ONCE A DAY   Accu-Chek FastClix Lancets MISC Use to check blood sugar up to 1 x weekly   aspirin EC 81 MG tablet Take 81 mg by mouth daily.   Blood Glucose Monitoring Suppl (ACCU-CHEK AVIVA PLUS) w/Device KIT Use glucometer to check blood sugar 1 x weekly as advised   Calcium Carb-Cholecalciferol (CALCIUM-VITAMIN D) 500-400 MG-UNIT TABS TAKE ONE TABLET BY MOUTH TWICE DAILY WITH MEALS (Patient not taking: No sig reported)   Cholecalciferol (VITAMIN D3) 1000 units CAPS Take 1,000 Units by mouth daily.   enalapril (VASOTEC) 20 MG tablet TAKE ONE TABLET BY MOUTH TWICE DAILY   finasteride (PROSCAR) 5 MG tablet TAKE ONE TABLET BY MOUTH  ONCE DAILY   Glucosamine-Chondroit-Vit C-Mn (GLUCOSAMINE 1500 COMPLEX PO) Take 1,500 mg by mouth daily.   Multiple Vitamin (MULTIVITAMIN) tablet Take 1 tablet by mouth daily. Reported on 11/21/2015   omeprazole (PRILOSEC) 20 MG capsule TAKE ONE CAPSULE BY MOUTH TWICE DAILY BEFORE MEALS   psyllium (METAMUCIL) 58.6 % powder Take 1 packet by mouth daily.   tamsulosin (FLOMAX) 0.4 MG CAPS capsule TAKE TWO CAPSULES BY MOUTH EVERY MORNING TAKE AFTER BREAKFAST   No facility-administered encounter medications on file as of 10/10/2021.    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   Gastric outlet obstruction    Swallowing difficulty    Allergic rhinitis 05/30/2015   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/30/2015   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   Bradycardia 05/30/2015   Essential (primary) hypertension 05/30/2015   Esophagitis, reflux 05/30/2015   Lipoma of neck 05/30/2015   Blood glucose elevated 05/30/2015   Lump in scrotum 05/30/2015    Conditions to be addressed/monitored:HTN, HLD, Pulmonary Disease, and chronic  pain   Care Plan : RNCM: Hypertension (Adult)  Updates made by Vanita Ingles, RN  since 10/10/2021 12:00 AM  Completed 10/10/2021   Problem: RNCM: Hypertension (Hypertension) Resolved 10/10/2021  Priority: High     Long-Range Goal: RNCM: Hypertension Monitored Completed 10/10/2021  Start Date: 10/18/2020  Expected End Date: 12/14/2021   Recent Progress: On track  Priority: High  Note:   Objective: Resolving, duplicate goal Last practice recorded BP readings:  BP Readings from Last 3 Encounters:  08/08/21 128/83  04/16/21 (!) 139/56  11/19/20 (!) 143/63   Most recent eGFR/CrCl: No results found for: EGFR  No components found for: CRCL Current Barriers:  Knowledge Deficits related to basic understanding of hypertension pathophysiology and self care management Knowledge Deficits related to understanding of medications prescribed for management of hypertension Unable to independently manage HTN. Blood pressures up and down. States stable at home. Will continue to monitor. Does not contact provider office for questions/concerns Case Manager Clinical Goal(s):  patient will verbalize understanding of plan for hypertension management patient will attend all scheduled medical appointments: 10-18-2021 at 0900 am patient will demonstrate improved adherence to prescribed treatment plan for hypertension as evidenced by taking all medications as prescribed, monitoring and recording blood pressure as directed, adhering to low sodium/DASH diet patient will demonstrate improved health management independence as evidenced by checking blood pressure as directed and notifying PCP if SBP>150 or DBP > 90, taking all medications as prescribe, and adhering to a low sodium diet as discussed. patient will verbalize basic understanding of hypertension disease process and self health management plan as evidenced by compliance with heart healthy diet, compliance with medications and working with the CCM team to manage chronic health conditions and reach wellness goals.  Interventions:  Collaboration with [name], DO regarding development and update of comprehensive plan of care as evidenced by provider attestation and co-signature Inter-disciplinary care team collaboration (see longitudinal plan of care) Evaluation of current  treatment plan related to hypertension self management and patient's adherence to plan as established by provider.06-20-2021: The patient saw the cardiology and rheumatology recently and had good blood pressures. Denies any issues with HTN management at this time. 08-08-2021: The patient is doing well with his HTN. Does have some variations with his blood pressures but denies any acute findings. States his blood pressures do go up depending on what he eats. Denies any acute distress.  Provided education to patient re: stroke prevention, s/s of heart attack and stroke, DASH diet, complications of uncontrolled blood pressure. The patient was getting meals on wheels but now is eating in the cafeteria of his apartment building. He usually goes each day around 11 am. States this is working well for him. He denies any new issues with his blood pressures. Saw pcp recently and no changes made in the plan of care. 08-08-2021: Is eating good. States he doesn't really get thirsty and he is trying to drink more water.  Reviewed medications with patient and discussed importance of compliance. 08-08-2021: The patient is compliant with his plan of care.  Discussed plans with patient for ongoing care management follow up and provided patient with direct contact information for care management team Advised patient, providing education and rationale, to monitor blood pressure daily and record, calling PCP for findings outside established parameters. 06-20-2021: Denies headaches or issues related to cardiac health. Is eating well, states he is staying away from fried foods. 08-08-2021: States that his blood pressure this am was 128/83 with pulse of 48. Weight is 142.2. He states his blood pressure does go up and down sometimes depending on  what he eats. Has had some readings of 145/127 and 123/97. Education and support given. The patient knows the parameters of systolic blood pressure less that 782 and diastolic <95. Did receive the  flu vaccine and he thinks it was on 06-29-2021. They gave it to him at his apartment. He states he will bring the paperwork with him to the MD appointment in December for documentation.  Reviewed scheduled/upcoming provider appointments including: 10-18-2021 at 0900 am Self-Care Activities: - Self administers medications as prescribed Attends all scheduled provider appointments Calls provider office for new concerns, questions, or BP outside discussed parameters Checks BP and records as discussed Follows a low sodium diet/DASH diet Patient Goals: - check blood pressure 3 times per week - choose a place to take my blood pressure (home, clinic or office, retail store) - write blood pressure results in a log or diary - agree on reward when goals are met - agree to work together to make changes - ask questions to understand - have a family meeting to talk about healthy habits - learn about high blood pressure  Follow Up Plan: Telephone follow up appointment with care management team member scheduled for: 10-10-2021 at 0945 am    Care Plan : RNCM: HLD management  Updates made by Vanita Ingles, RN since 10/10/2021 12:00 AM  Completed 10/10/2021   Problem: RNCM: HLD Management Resolved 10/10/2021  Priority: Medium     Long-Range Goal: RNCM: HLD Management Completed 10/10/2021  Start Date: 10/18/2021  Expected End Date: 12/14/2021  Recent Progress: On track  Priority: Medium  Note:   Current Barriers: Resolving, duplicate goal  Poorly controlled hyperlipidemia, complicated by HTN Current antihyperlipidemic regimen: diet controlled  Most recent lipid panel:     Component Value Date/Time   CHOL 138 10/02/2020 0759   TRIG 86 10/02/2020 0759   HDL 47 10/02/2020 0759   CHOLHDL 2.9 10/02/2020 0759   VLDL 28 09/09/2016 0001   LDLCALC 74 10/02/2020 0759  Will have lab work in December  ASCVD risk enhancing conditions: age >27 Does not contact provider office for questions/concerns RN  Care Manager Clinical Goal(s):  patient will work with Consulting civil engineer, providers, and care team towards execution of optimized self-health management plan patient will verbalize understanding of plan for effective management of HLD  patient will work with RNCM, pcp and CCM team  to address needs related to HLD patient will take all medications exactly as prescribed and will call provider for medication related questions patient will attend all scheduled medical appointments: 10-18-2021 at 0900 am the patient will demonstrate ongoing self health care management ability Interventions: Collaboration with [name], DO regarding development and update of comprehensive plan of care as evidenced by provider attestation and co-signature Inter-disciplinary care team collaboration (see longitudinal plan of care) Medication review performed; medication list updated in electronic medical record. 08-08-2021: The patient is compliant with medications Inter-disciplinary care team collaboration (see longitudinal plan of care) Referred to pharmacy team for assistance with HLD medication management Evaluation of current treatment plan related to HLD and patient's adherence to plan as established by provider. 06-20-2021: The patient is doing well. Is watching what he eats. Trying to stay away from fried foods. States he has been having a little stomach pain but not bad like when he had the flare up with his gallbladder. The patient states he is doing okay and monitoring what his body tells him. Denies any acute distress. 08-08-2021: The patient is eating well. Denies any  further stomach discomfort. States he feels he is stable to be 80 years old.  Advised patient to call the office for any concerns or questions.  Reviewed scheduled/upcoming provider appointments including: 10-18-2021  Discussed plans with patient for ongoing care management follow up and provided patient with direct contact information  for care management team Patient Goals/Self-Care Activities: - call for medicine refill 2 or 3 days before it runs out - call if I am sick and can't take my medicine - keep a list of all the medicines I take; vitamins and herbals too - learn to read medicine labels - use a pillbox to sort medicine - use an alarm clock or phone to remind me to take my medicine - change to whole grain breads, cereal, pasta - drink 6 to 8 glasses of water each day - eat 3 to 5 servings of fruits and vegetables each day - eat 5 or 6 small meals each day - eat fish at least once per week - fill half the plate with nonstarchy vegetables - keep a food diary - limit fast food meals to no more than 1 per week - manage portion size - read food labels for fat, fiber, carbohydrates and portion size - be open to making changes - I can manage, know and watch for signs of a heart attack - if I have chest pain, call for help - learn about small changes that will make a big difference - learn my personal risk factors - barriers to meeting goals identified - change-talk evoked - choices provided - collaboration with team encouraged - decision-making supported - health risks reviewed - problem-solving facilitated - questions answered - readiness for change evaluated - reassurance provided - resources needed to meet goals identified - self-reflection promoted - self-reliance encouraged Follow Up Plan: Telephone follow up appointment with care management team member scheduled for: 10-10-2021 at [place] am      Care Plan : RNCM: Airway hypersensitivity  Updates made by Vanita Ingles, RN since 10/10/2021 12:00 AM  Completed 10/10/2021   Problem: RNCM: Airway Hypersensitivity Resolved 10/10/2021  Priority: High     Long-Range Goal: RNCM: Shortness of Breath/ Airway Hypersensitivity Disease Progression Minimized or Managed Completed 10/10/2021  Start Date: 10/18/2020  Recent Progress: On track  Priority: High   Note:   Current Barriers: Resolving, duplicate goal  Knowledge deficits related to basic understanding of shortness of breath and airway hypersensitivity disease process Knowledge deficits related to basic shortness of breath and airway hypersensitivity self care/management Knowledge deficit related to basic understanding of how to use inhalers and how inhaled medications work Knowledge deficit related to importance of energy conservation Limited Social Support Unable to independently manage episodes of shortness of breath with airway hypersensitivity disease process Lacks social connections Does not contact provider office for questions/concerns  Case Manager Clinical Goal(s): patient will report utilizing pursed lip breathing for shortness of breath patient will verbalize understanding of shortness of breath action plan and when to seek appropriate levels of medical care patient will engage in lite exercise as tolerated to build/regain stamina and strength and reduce shortness of breath through activity tolerance patient will verbalize basic understanding of shortness of breath  disease process and self care activities patient will not be hospitalized for shortness of breath exacerbation as evidenced of working with the CCM team to optimize pulmonary health and well being.  Interventions:  Collaboration with [name], DO regarding development and update of comprehensive plan of care as evidenced  by provider attestation and co-signature Inter-disciplinary care team collaboration (see longitudinal plan of care) Provided patient with basic written and verbal shortness of breath and airway hypersensitively  education on self care/management/and exacerbation prevention.  06-20-2021: The patient uses an air purifier during the day and he feels this helps a lot with his shortness of breath. He uses a dehumidifier at night.  He states his breathing is stable at this time and he has not  had any exacerbations. Endorses taking medications as prescribed. 08-08-2021: The patient states his breathing is stable. Still coughs up thick sputum and states that it is clear. Review of changes in color and alerting the pcp for changes. He denies any colorations of brown or green. Education on staying hydrated and this helping with thinning secretions. States he has to clear secretions often during the day and at night sometimes. Endorses sleeping well. Has a new mattress but has it broke in pretty good now.  Provided patient with shortness of breath action plan and reinforced importance of daily self assessment. 06-20-2021: The patient is mindful of what his body tells him and pays attention to changes especially in his breathing. 08-08-2021: Denies any issues with weather changes Provided written and verbal instructions on pursed lip breathing and utilized returned demonstration as teach back Provided instruction about proper use of medications used for management of shortness of breath  including inhalers Advised patient to self assesses shortness of breath action plan zone and make appointment with provider if in the yellow zone for 48 hours without improvement. Provided patient with education about the role of exercise in the management of shortness of breath Advised patient to engage in light exercise as tolerated 3-5 days a week Provided education about and advised patient to utilize infection prevention strategies to reduce risk of respiratory infection. 08-08-2021: Review and education done with the patient today on sx and sx of infections Self-Care Activities:  Patient verbalizes understanding of plan to effective management of shortness of breath onset and airway hypersensitivity disease process Self administers medications as prescribed Attends all scheduled provider appointments Calls pharmacy for medication refills Attends church or other social activities Performs ADL's  independently Performs IADL's independently Calls provider office for new concerns or questions Patient Goals: - do breathing exercises every day - do breathing exercises at least 2 times each day - do exercises in a comfortable position that makes breathing as easy as possible - develop a new routine to improve sleep - don't eat or exercise right before bedtime - eat healthy - get at least 7 to 8 hours of sleep at night - get outdoors every day (weather permitting) - keep room cool and dark - limit daytime naps - practice relaxation or meditation daily - use a fan or white noise in bedroom - use devices that will help like a cane, sock-puller or reacher - develop a rescue plan - eliminate symptom triggers at home- has removed all "plug ins" - follow rescue plan if symptoms flare-up - keep follow-up appointments - use an extra pillow to sleep - avoid second hand smoke - eliminate smoking in my home - identify and avoid work-related triggers - identify and remove indoor air pollutants- patient has not used anymore "plug ins", this caused him to have an exacerbation of his shortness of breath back in the spring - limit outdoor activity during cold weather - listen for public air quality announcements every day - activity or exercise based on tolerance encouraged - barriers to treatment managed -  breathing techniques encouraged - communicable disease prevention promoted - individualized medical nutrition therapy provided - medication-adherence assessment completed - quality of sleep assessed - rescue (action) plan reviewed - screen for functional limitations completed and reviewed - self-awareness of symptom triggers encouraged  Follow Up Plan: Telephone follow up appointment with care management team member scheduled for: 10-10-2021 at [place] am    Care Plan : RNCM: Chronic Pain (Adult)- right knee pain  Updates made by Vanita Ingles, RN since 10/10/2021 12:00 AM  Completed  10/10/2021   Problem: RNCM: Pain Management Plan (Chronic Pain)- right knee pain Resolved 10/10/2021  Priority: High     Long-Range Goal: RNCM: Pain Management Plan Developed Completed 10/10/2021  Start Date: 10/18/2020  Expected End Date: 08/16/2021  Recent Progress: On track  Priority: High  Note:   Current Barriers: Resolving, duplicate goal  Knowledge Deficits related to managing acute/chronic pain Non-adherence to scheduled provider appointments Non-adherence to prescribed medication regimen Chronic Disease Management support and education needs related to chronic pain Unable to independently manage chronic pain to right knee  Nurse Case Manager Clinical Goal(s):  patient will verbalize understanding of plan for managing pain patient will attend all scheduled medical appointments: next pcp on 10-18-2021 at 0900 am patient will demonstrate use of different relaxation  skills and/or diversional activities to assist with pain reduction (distraction, imagery, relaxation, massage, acupressure, TENS, heat, and cold application patient will report pain at a level less than 3 to 4 on a 10-10 rating scale patient will use pharmacological and nonpharmacological pain relief strategies patient will verbalize acceptable level of pain relief and ability to engage in desired activities patient will engage in desired activities without an increase in pain level Interventions:  Collaboration with Parks Ranger, Devonne Doughty, DO regarding development and update of comprehensive plan of care as evidenced by provider attestation and co-signature Inter-disciplinary care team collaboration (see longitudinal plan of care) - effectiveness of pharmacologic therapy monitored- patient states that the specialist did not give him pain medications. Is taking over the counter medications. 06-20-2021: The patient has used "liniment" the last two days on his right knee for exacerbation of pain. States that he got on  the floor looking under his bed for something and it has been bothering him more since then. Per the specialist note the patient can take Tylenol or ASA for pain relief. The patient states he has not taken anything over the counter and will see how the liniment works. States it is feeling better since using the liniment. 08-08-2021: Denies any acute findings. Still using the liniment on his knee and using his cane when walking. Denies any falls or other issues at this time. Will continue to monitor.  - motivation and barriers to change assessed and addressed. 04-18-2021: The patient states that he still believes he has fluid on his knee but he says some days are better than others. The patient denies any acute distress. Denies falls or safety concerns. 06-20-2021: Has some edema on his knee but not worse than usual. Is using compression socks to help with circulation. Denies any acute distress. Is pacing his activity. 08-08-2021: Denies any edema in his knee today. Continue to follow plan of care for management of his arthritis pain and discomfort.  - mutually acceptable comfort goal set - pain assessed. 06-20-2021: States his pain is better since applying the liniment. Denies any acute distress  - pain treatment goals reviewed- 08-08-2021: Saw the specialist on 05-28-2021, feels like he is stable. No changes  in the plan of care. States he has good days and bad days with pain  - premedication prior to activity encouraged Evaluation of current treatment plan related to chronic right knee pain  and patient's adherence to plan as established by provider. 04-18-2021: Saw the pcp on 6-21--2022- no changes in the plan of care at this time. States he is doing well today and denies any acute distress. Takes OTC medications when pain relief needed. Will continue to monitor. 06-20-2021: Having a flare up in his right knee pain today. Denies any acute distress. States he is using "liniment" for pain relief. Knows he can take  Tylenol or ASA as needed for pain relief. Will continue to monitor. 08-08-2021: Denies any acute distress at this time. States that his pain level is stable. Will continue to monitor.  Advised patient to call the office for changes in level or intensity of pain  Provided education to patient re: safety and preventing falls in the home and environment.  Reviewed medications with patient and discussed compliance. 08-08-2021: The patient is compliant with medications.  Collaborated with pcp and pharm D regarding ongoing pain in right knee and recommendations for relief of pain to right knee  Discussed plans with patient for ongoing care management follow up and provided patient with direct contact information for care management team Allow patient to maintain a diary of pain ratings, timing, precipitating events, medications, treatments, and what works best to relieve pain,  Refer to support groups and self-help groups Educate patient about the use of pharmacological interventions for pain management- antianxiety, antidepressants, NSAIDS, opioid analgesics,  Explain the importance of lifestyle modifications to effective pain management  Patient Goals/Self Care Activities:  - mutually acceptable comfort goal set - pain assessed - pain management plan developed - pain treatment goals reviewed - patient response to treatment assessed - sharing of pain management plan with teachers and other caregivers encouraged Self-administers medications as prescribed Attends all scheduled provider appointments Calls pharmacy for medication refills Calls provider office for new concerns or questions Follow Up Plan: Telephone follow up appointment with care management team member scheduled for: 10-10-2021 at 0945 am              Care Plan : RNCM: General Plan of Care (Adult) for Chronic Disease Management and Care Coordination Needs  Updates made by Vanita Ingles, RN since 10/10/2021 12:00 AM      Problem: RNCM: Development of Plan of Care for Chronic Disease Management (HTN, HLD, Airway hypersensititvity, chronic pain- right knee)   Priority: High     Long-Range Goal: RNCM: Effective Management  of Plan of Care for Chronic Disease Management (HTN, HLD, Airway hypersensititvity, chronic pain- right knee)   Start Date: 10/10/2021  Expected End Date: 10/10/2022  Priority: High  Note:   Current Barriers:  Knowledge Deficits related to plan of care for management of HTN, HLD, Pulmonary Disease, and Chronic pain   Chronic Disease Management support and education needs related to HTN, HLD, Pulmonary Disease, and Chronic pain  RNCM Clinical Goal(s):  Patient will verbalize basic understanding of HTN, HLD, Pulmonary Disease, and Chronic Pain disease process and self health management plan as evidenced by stable blood pressures, labs wnl, taking medications as directed, compliance with plan of care and working with the CCM team to effectively mange health and well being take all medications exactly as prescribed and will call provider for medication related questions as evidenced by compliance and calling for refills before running out  attend all scheduled medical appointments: 10-18-2021 as evidenced by keeping appointments and calling for needed schedule changes         demonstrate improved and ongoing adherence to prescribed treatment plan for HTN, HLD, Pulmonary Disease, and Chronic pain  as evidenced by stable conditions, no exacerbations of pain, VS WNL demonstrate a decrease in Osteoarthritis exacerbations  as evidenced by controlled pain in right knee and hand pain  demonstrate ongoing self health care management ability for effective management of chronic conditions as  as evidenced by working with the CCM team through collaboration with Consulting civil engineer, provider, and care team.   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.) Long Term Goal  Reviewed medications with patient, including use of prescribed maintenance and rescue inhalers, and provided instruction on medication management and the importance of adherence Provided patient with basic written and verbal airway hypersensitivity  education on self care/management/and exacerbation prevention Advised patient to track and manage hypersensitivity triggers Provided written and verbal instructions on pursed lip breathing and utilized returned demonstration as teach back Provided instruction about proper use of medications used for management of airway hypersensitivity  including inhalers Advised patient to self assesses airway hypersensitivity  action plan zone and make appointment with provider if in the yellow zone for 48 hours without improvement Advised patient to engage in light exercise as tolerated 3-5 days a week to aid in the the management of Airway hypersensitivity  Provided education about and advised patient to utilize infection prevention strategies to reduce risk of respiratory infection Discussed the importance of adequate rest and management of fatigue with Airway Hypersensitivity  Screening for signs and symptoms of depression related to chronic disease state  Assessed social determinant of health barriers  Hyperlipidemia:  (Status: Goal on Track (progressing): YES.) Long Term Goal  Lab Results  Component Value Date   CHOL 138 10/02/2020   HDL 47 10/02/2020   LDLCALC 74 10/02/2020   TRIG 86 10/02/2020   CHOLHDL 2.9 10/02/2020     Medication review performed; medication list updated in electronic  medical record.  Provider established cholesterol goals reviewed; Counseled on importance of regular laboratory monitoring as prescribed; Provided HLD educational materials; Reviewed role and benefits of statin for ASCVD risk reduction; Discussed strategies to manage statin-induced myalgias; Reviewed importance of limiting foods high in cholesterol;  Hypertension: (Status: Goal on Track (progressing): YES.) Last practice recorded BP readings:  BP Readings from Last 3 Encounters:  08/08/21 128/83  04/16/21 (!) 139/56  11/19/20 (!) 143/63  Most recent eGFR/CrCl: No results found for: EGFR  No components found for: CRCL  Evaluation of current treatment plan related to hypertension self management and patient's adherence to plan as established by provider;   Provided education to patient re: stroke prevention, s/s of heart attack and stroke; Reviewed prescribed diet heart healthy Reviewed medications with patient and discussed importance of compliance;  Discussed plans with patient for ongoing care management follow up and provided patient with direct contact information for care management team; Advised patient, providing education and rationale, to monitor blood pressure daily and record, calling PCP for findings outside established parameters. 10-10-2021: The patient was worried about some of his  blood pressure readings. The patients reading this am was 112/68. The patient states that he doesn't like it when top number is 140's. Education on goal of systolic of <168 and diastolic <37. Review of low blood pressures and to monitor for orthostatic hypotension. The patient verbalized feeling better since having some parameters to go by. Education and support given.  Advised patient to discuss blood pressure trends  with provider; Provided education on prescribed diet heart healthy;  Discussed complications of poorly controlled blood pressure such as heart disease, stroke, circulatory  complications, vision complications, kidney impairment, sexual dysfunction;   Pain:  (Status: Goal on Track (progressing): YES.) Long Term Goal  Pain assessment performed. Patient states no pain today. He has to monitor for changes in his right knee and right hand. He knows when he has overdone it. Medications reviewed Reviewed provider established plan for pain management; Discussed importance of adherence to all scheduled medical appointments; Counseled on the importance of reporting any/all new or changed pain symptoms or management strategies to pain management provider; Advised patient to report to care team affect of pain on daily activities; Discussed use of relaxation techniques and/or diversional activities to assist with pain reduction (distraction, imagery, relaxation, massage, acupressure, TENS, heat, and cold application; Reviewed with patient prescribed pharmacological and nonpharmacological pain relief strategies; Advised patient to discuss unresolved pain, changes in level or intensity of pain with provider;  Patient Goals/Self-Care Activities: Take medications as prescribed   Attend all scheduled provider appointments Call pharmacy for medication refills 3-7 days in advance of running out of medications Attend church or other social activities Perform all self care activities independently  Perform IADL's (shopping, preparing meals, housekeeping, managing finances) independently Call provider office for new concerns or questions  Work with the social worker to address care coordination needs and will continue to work with the clinical team to address health care and disease management related needs call the Suicide and Crisis Lifeline: 988 call the [place] National Suicide Prevention Lifeline: 212 805 7812 or TTY: 217-399-8748 TTY 347 477 1113) to talk to a trained counselor call 1-800-273-TALK (toll free, 24 hour hotline) if experiencing a Mental Health or [place]  check blood pressure daily choose a place to take my blood pressure (home, clinic or office, retail store) write blood pressure results in a log or diary learn about high blood pressure keep a blood pressure log take blood pressure log to all doctor appointments call doctor for signs and symptoms of high blood pressure develop an action plan for high blood pressure keep all doctor appointments take medications for blood pressure exactly as prescribed begin an exercise program report new symptoms to your doctor eat more whole grains, fruits and vegetables, lean meats and healthy fats - call for medicine refill 2 or 3 days before it runs out - take all medications exactly as prescribed - call doctor with any symptoms you believe are related to your medicine - call doctor when you experience any new symptoms - go to all doctor appointments as scheduled - adhere to prescribed diet: Heart healthy diet        Plan:Telephone follow up appointment with care management team member scheduled for:  12-19-2021 at 64 am  Noreene Larsson RN, MSN, [place] Mobile: [phone]

## 2021-10-11 ENCOUNTER — Other Ambulatory Visit: Payer: Self-pay

## 2021-10-11 DIAGNOSIS — N138 Other obstructive and reflux uropathy: Secondary | ICD-10-CM

## 2021-10-11 DIAGNOSIS — I10 Essential (primary) hypertension: Secondary | ICD-10-CM

## 2021-10-11 DIAGNOSIS — E782 Mixed hyperlipidemia: Secondary | ICD-10-CM

## 2021-10-11 DIAGNOSIS — R351 Nocturia: Secondary | ICD-10-CM

## 2021-10-11 DIAGNOSIS — R7309 Other abnormal glucose: Secondary | ICD-10-CM

## 2021-10-11 DIAGNOSIS — Z0000 Encounter for general adult medical examination without abnormal findings: Secondary | ICD-10-CM

## 2021-10-14 ENCOUNTER — Other Ambulatory Visit: Payer: Medicare Other

## 2021-10-14 ENCOUNTER — Other Ambulatory Visit: Payer: Self-pay

## 2021-10-14 DIAGNOSIS — R7309 Other abnormal glucose: Secondary | ICD-10-CM | POA: Diagnosis not present

## 2021-10-14 DIAGNOSIS — E782 Mixed hyperlipidemia: Secondary | ICD-10-CM | POA: Diagnosis not present

## 2021-10-14 DIAGNOSIS — I10 Essential (primary) hypertension: Secondary | ICD-10-CM | POA: Diagnosis not present

## 2021-10-15 LAB — COMPLETE METABOLIC PANEL WITH GFR
AG Ratio: 2 (calc) (ref 1.0–2.5)
ALT: 17 U/L (ref 9–46)
AST: 21 U/L (ref 10–35)
Albumin: 4 g/dL (ref 3.6–5.1)
Alkaline phosphatase (APISO): 98 U/L (ref 35–144)
BUN: 15 mg/dL (ref 7–25)
CO2: 30 mmol/L (ref 20–32)
Calcium: 9 mg/dL (ref 8.6–10.3)
Chloride: 101 mmol/L (ref 98–110)
Creat: 0.72 mg/dL (ref 0.70–1.22)
Globulin: 2 g/dL (calc) (ref 1.9–3.7)
Glucose, Bld: 101 mg/dL — ABNORMAL HIGH (ref 65–99)
Potassium: 4.1 mmol/L (ref 3.5–5.3)
Sodium: 140 mmol/L (ref 135–146)
Total Bilirubin: 1.2 mg/dL (ref 0.2–1.2)
Total Protein: 6 g/dL — ABNORMAL LOW (ref 6.1–8.1)
eGFR: 91 mL/min/{1.73_m2} (ref >=60)

## 2021-10-15 LAB — CBC WITH DIFFERENTIAL/PLATELET
Absolute Monocytes: 360 cells/uL (ref 200–950)
Basophils Absolute: 21 cells/uL (ref 0–200)
Basophils Relative: 0.4 %
Eosinophils Absolute: 32 cells/uL (ref 15–500)
Eosinophils Relative: 0.6 %
HCT: 46.7 % (ref 38.5–50.0)
Hemoglobin: 15.8 g/dL (ref 13.2–17.1)
Lymphs Abs: 1055 cells/uL (ref 850–3900)
MCH: 32.2 pg (ref 27.0–33.0)
MCHC: 33.8 g/dL (ref 32.0–36.0)
MCV: 95.3 fL (ref 80.0–100.0)
MPV: 10.9 fL (ref 7.5–12.5)
Monocytes Relative: 6.8 %
Neutro Abs: 3832 cells/uL (ref 1500–7800)
Neutrophils Relative %: 72.3 %
Platelets: 161 10*3/uL (ref 140–400)
RBC: 4.9 10*6/uL (ref 4.20–5.80)
RDW: 12.5 % (ref 11.0–15.0)
Total Lymphocyte: 19.9 %
WBC: 5.3 10*3/uL (ref 3.8–10.8)

## 2021-10-15 LAB — LIPID PANEL
Cholesterol: 111 mg/dL (ref <200)
HDL: 37 mg/dL — ABNORMAL LOW (ref >=40)
LDL Cholesterol (Calc): 58 mg/dL (calc)
Non-HDL Cholesterol (Calc): 74 mg/dL (calc) (ref <130)
Total CHOL/HDL Ratio: 3 (calc) (ref <5.0)
Triglycerides: 77 mg/dL (ref <150)

## 2021-10-15 LAB — HEMOGLOBIN A1C
Hgb A1c MFr Bld: 5 % of total Hgb (ref <5.7)
Mean Plasma Glucose: 97 mg/dL
eAG (mmol/L): 5.4 mmol/L

## 2021-10-15 LAB — PSA: PSA: 0.36 ng/mL (ref <=4.00)

## 2021-10-18 ENCOUNTER — Encounter: Payer: Self-pay | Admitting: Family Medicine

## 2021-10-18 ENCOUNTER — Ambulatory Visit (INDEPENDENT_AMBULATORY_CARE_PROVIDER_SITE_OTHER): Payer: Medicare Other | Admitting: Family Medicine

## 2021-10-18 ENCOUNTER — Other Ambulatory Visit: Payer: Self-pay

## 2021-10-18 VITALS — BP 124/75 | HR 82 | Ht 67.0 in | Wt 141.8 lb

## 2021-10-18 DIAGNOSIS — M1120 Other chondrocalcinosis, unspecified site: Secondary | ICD-10-CM

## 2021-10-18 DIAGNOSIS — R739 Hyperglycemia, unspecified: Secondary | ICD-10-CM

## 2021-10-18 DIAGNOSIS — N401 Enlarged prostate with lower urinary tract symptoms: Secondary | ICD-10-CM | POA: Diagnosis not present

## 2021-10-18 DIAGNOSIS — N138 Other obstructive and reflux uropathy: Secondary | ICD-10-CM

## 2021-10-18 DIAGNOSIS — Z0000 Encounter for general adult medical examination without abnormal findings: Secondary | ICD-10-CM | POA: Diagnosis not present

## 2021-10-18 DIAGNOSIS — M159 Polyosteoarthritis, unspecified: Secondary | ICD-10-CM | POA: Diagnosis not present

## 2021-10-18 DIAGNOSIS — K2100 Gastro-esophageal reflux disease with esophagitis, without bleeding: Secondary | ICD-10-CM

## 2021-10-18 DIAGNOSIS — E782 Mixed hyperlipidemia: Secondary | ICD-10-CM

## 2021-10-18 DIAGNOSIS — I10 Essential (primary) hypertension: Secondary | ICD-10-CM | POA: Diagnosis not present

## 2021-10-18 DIAGNOSIS — J3089 Other allergic rhinitis: Secondary | ICD-10-CM

## 2021-10-18 MED ORDER — OMEPRAZOLE 20 MG PO CPDR: 20.0000 mg | DELAYED_RELEASE_CAPSULE | Freq: Two times a day (BID) | ORAL | 3 refills | Status: DC

## 2021-10-18 MED ORDER — TAMSULOSIN HCL 0.4 MG PO CAPS: 0.8000 mg | ORAL_CAPSULE | Freq: Every day | ORAL | 3 refills | Status: DC

## 2021-10-18 MED ORDER — MONTELUKAST SODIUM 10 MG PO TABS: 10.0000 mg | ORAL_TABLET | Freq: Every day | ORAL | 3 refills | Status: DC

## 2021-10-18 MED ORDER — FINASTERIDE 5 MG PO TABS: 5.0000 mg | ORAL_TABLET | Freq: Every day | ORAL | 3 refills | Status: DC

## 2021-10-18 MED ORDER — ENALAPRIL MALEATE 20 MG PO TABS
20.0000 mg | ORAL_TABLET | Freq: Two times a day (BID) | ORAL | 3 refills | Status: DC
Start: 2021-10-18 — End: 2021-12-25

## 2021-10-18 MED ORDER — ACCU-CHEK AVIVA PLUS VI STRP: ORAL_STRIP | 12 refills | Status: DC

## 2021-10-18 NOTE — Patient Instructions (Addendum)
Thank you for coming to the office today.  Refilled all meds and added Montelukast (Singulair) 10mg  nightly to help with sinus and allergy  Keep up the great work overall!  Recent Labs    10/14/21 0758  HGBA1C 5.0   Sugar is excellent  Cholesterol is good  Blood pressure is normal.  Please schedule a Follow-up Appointment to: Return in about 6 months (around 04/18/2022) for 6 month follow-up HTN.  If you have any other questions or concerns, please feel free to call the office or send a message through [place]. You may also schedule an earlier appointment if necessary.  Additionally, you may be receiving a survey about your experience at our office within a few days to 1 week by e-mail or mail. We value your feedback.  [name], DO [place]

## 2021-10-18 NOTE — Assessment & Plan Note (Signed)
Controlled HTN Rare DBP readings >100 - Home BP readings normal, detailed log - occasional elevated SBP before takes med  No known complications    Plan:  1. Continue Enalapril 20mg  BID 2. Encourage improved lifestyle - low sodium diet, regular exercise 3. Continue monitor BP outside office, bring readings to next visit, if persistently >140/90 or new symptoms notify office sooner

## 2021-10-18 NOTE — Assessment & Plan Note (Signed)
Followed by [facility] Rheumatology primary etiology for multiple joint pain, also with pseudogout. - Continue Tylenol regularly dosing  Meds per Rheum previous Prednisone - RICE therapy

## 2021-10-18 NOTE — Progress Notes (Signed)
Subjective:    Patient ID: Zachary Brewer, male    DOB: 10-12-1941, 80 y.o.   MRN: [id number]  Zachary Brewer is a 80 y.o. male presenting on 10/18/2021 for Hypertension   HPI  Here for Annual Physical and Lab Review.   CHRONIC HTN: Reports no new concerns. Doing well. BP readings have been controlled. Home BP 137/83, HR 65 Current Meds - Enalapril 464m BID Reports good compliance, took meds today. Tolerating well, w/o complaints. Rare dizziness episode Denies CP, dyspnea, HA, edema, dizziness / lightheadedness   History of Elevated Blood Sugar: Improved overall A1c down to 5.0 Reduced sugars starches Fam history DM CBGs: Avg 95, Low >80 High < 120. Checks CBGs x 1 weekly fasting - has detailed log Meds: None (never) Currently on ACEi Lifestyle: - Diet: Healthy diet. 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 Doing well on Flomax 0.64m68m2 daily and Finasteride 5mg8meeds refill for future on meds today He has occasional issues if holds urine may have urgency and mild incontinence if worse on one particular Brewer, he wears depends pull ups Overall improved LUTS   Pseudogout Arthropathy (CPDD) - History of multiple joint pain and OA/DJD and Gouty arthritis (wrists and knee). [facility] (Dr BehaAnnalee Gentaontrolled on regular Tylenol dosing - He has been on prednisone PRN in past.   HM COVID19 vaccine updated.   PSA 0.36 (09/2021)   Depression screen PHQ [facility] 06/20/2021 02/05/2021 10/09/2020  Decreased Interest 0 0 0  Down, Depressed, Hopeless  0 0 0  PHQ - 2 Score 0 0 0    Past Medical History:  Diagnosis Date   Allergy    Arthritis 05/30/2015   Gout - Left knee and ankle   Cervical pain 05/30/2015   GERD (gastroesophageal reflux disease)    Hyperlipidemia    Hypertension    Lipoma of neck 05/30/2015   Wears dentures    full upper and lower (doesn't wear lower)   Wears hearing aid    bilateral   Past Surgical History:  Procedure Laterality Date   CATARACT EXTRACTION W/PHACO Right 06/11/2016   Procedure: CATARACT EXTRACTION PHACO AND INTRAOCULAR LENS PLACEMENT (IOC)[place]geon: ChadLeandrew Koyanagi;  [place];  Laterality: Right;   CATARACT EXTRACTION W/PHACO Left 07/09/2016   Procedure: CATARACT EXTRACTION PHACO AND INTRAOCULAR LENS PLACEMENT (IOC);  Surgeon: ChadLeandrew Koyanagi;  [place];  Laterality: Left;  MALYUGIN   ESOPHAGOGASTRODUODENOSCOPY (EGD) WITH PROPOFOL N/A 06/20/2015   Procedure: ESOPHAGOGASTRODUODENOSCOPY (EGD) WITH PROPOFOL with dialation;  Surgeon: DarrLucilla Lame;  [place];  Laterality: N/A;   FLEXIBLE BRONCHOSCOPY N/A 06/22/2015   Procedure: FLEXIBLE BRONCHOSCOPY;  Surgeon: SaadAllyne Gee;  Location: [facility];  Service: Pulmonary;  Laterality: N/A;   GASTRIC OUTLET OBSTRUCTION RELEASE  2013   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   UPPER GI ENDOSCOPY     Social History   Socioeconomic History   Marital status: Widowed    Spouse name: Not on file  Number of children: Not on file   Years of education: Not on file   Highest education level: 6th grade  Occupational History   Occupation: retired  Tobacco Use   Smoking status: Never   Smokeless tobacco: Never  Vaping Use   Vaping Use: Never used  Substance and Sexual Activity   Alcohol use: No   Drug use: No   Sexual activity: Not on file  Other Topics Concern   Not on file  Social History Narrative   Not on  file   Social Determinants of Health   Financial Resource Strain: Low Risk    Difficulty of Paying Living Expenses: Not hard at all  Food Insecurity: No Food Insecurity   Worried About Charity fundraiser in the Last Year: Never true   [place] in the Last Year: Never true  Transportation Needs: No Transportation Needs   Lack of Transportation (Medical): No   Lack of Transportation (Non-Medical): No  Physical Activity: Inactive   Days of Exercise per Week: 0 days   Minutes of Exercise per Session: 0 min  Stress: No Stress Concern Present   Feeling of Stress : Not at all  Social Connections: Moderately Isolated   Frequency of Communication with Friends and Family: More than three times a week   Frequency of Social Gatherings with Friends and Family: More than three times a week   Attends Religious Services: More than 4 times per year   Active Member of [employer] or Organizations: No   Attends Archivist Meetings: Never   Marital Status: Widowed  Human resources officer Violence: Not At Risk   Fear of Current or Ex-Partner: No   Emotionally Abused: No   Physically Abused: No   Sexually Abused: No   Family History  Problem Relation Age of Onset   Diabetes Mother    Alzheimer's disease Mother    Heart disease Father    Diabetes Sister    Diabetes Brother    Stroke Brother    Mental illness Brother    Hypertension Paternal Grandfather    Current Outpatient Medications on File Prior to Visit  Medication Sig   Accu-Chek FastClix Lancets MISC Use to check blood sugar up to 1 x weekly   aspirin EC 81 MG tablet Take 81 mg by mouth daily.   Blood Glucose Monitoring Suppl (ACCU-CHEK AVIVA PLUS) w/Device KIT Use glucometer to check blood sugar 1 x weekly as advised   Cholecalciferol (VITAMIN D3) 1000 units CAPS Take 1,000 Units by mouth daily.   fexofenadine (ALLEGRA) 180 MG tablet Take 180 mg by mouth daily.   Glucosamine-Chondroit-Vit C-Mn (GLUCOSAMINE 1500 COMPLEX PO) Take  1,500 mg by mouth daily.   Multiple Vitamin (MULTIVITAMIN) tablet Take 1 tablet by mouth daily. Reported on 11/21/2015   prednisoLONE acetate (PRED FORTE) 1 % ophthalmic suspension SMARTSIG:1 Drop(s) Left Eye 6 Times Daily   psyllium (METAMUCIL) 58.6 % powder Take 1 packet by mouth daily.   Calcium Carb-Cholecalciferol (CALCIUM-VITAMIN D) 500-400 MG-UNIT TABS TAKE ONE TABLET BY MOUTH TWICE DAILY WITH MEALS (Patient not taking: Reported on 02/05/2021)   No current facility-administered medications on file prior to visit.    Review of Systems  Constitutional:  Negative for activity change, appetite change, chills, diaphoresis, fatigue and fever.  HENT:  Negative for congestion and hearing loss.   Eyes:  Negative for visual disturbance.  Respiratory:  Negative for cough, chest tightness, shortness of breath and wheezing.   Cardiovascular:  Negative for  chest pain, palpitations and leg swelling.  Gastrointestinal:  Negative for abdominal pain, constipation, diarrhea, nausea and vomiting.  Genitourinary:  Negative for dysuria, frequency and hematuria.  Musculoskeletal:  Negative for arthralgias and neck pain.  Skin:  Negative for rash.  Neurological:  Negative for dizziness, weakness, light-headedness, numbness and headaches.  Hematological:  Negative for adenopathy.  Psychiatric/Behavioral:  Negative for behavioral problems, dysphoric mood and sleep disturbance.   Per HPI unless specifically indicated above    Objective:    BP 124/75    Pulse 82    Ht 5' 7" (1.702 m)    Wt 141 lb 12.8 oz (64.3 kg)    SpO2 96%    BMI 22.21 kg/m   Wt Readings from Last 3 Encounters:  10/18/21 141 lb 12.8 oz (64.3 kg)  08/08/21 142 lb 3.2 oz (64.5 kg)  04/16/21 149 lb (67.6 kg)    Physical Exam Vitals and nursing note reviewed.  Constitutional:      General: He is not in acute distress.    Appearance: He is well-developed. He is not diaphoretic.     Comments: Well-appearing, comfortable, cooperative   HENT:     Head: Normocephalic and atraumatic.  Eyes:     General:        Right eye: No discharge.        Left eye: No discharge.     Conjunctiva/sclera: Conjunctivae normal.     Pupils: Pupils are equal, round, and reactive to light.  Neck:     Thyroid: No thyromegaly.  Cardiovascular:     Rate and Rhythm: Normal rate and regular rhythm.     Pulses: Normal pulses.     Heart sounds: Normal heart sounds. No murmur heard. Pulmonary:     Effort: Pulmonary effort is normal. No respiratory distress.     Breath sounds: Normal breath sounds. No wheezing or rales.  Abdominal:     General: Bowel sounds are normal. There is no distension.     Palpations: Abdomen is soft. There is no mass.     Tenderness: There is no abdominal tenderness.  Musculoskeletal:        General: No tenderness. Normal range of motion.     Cervical back: Normal range of motion and neck supple.     Comments: Upper / Lower Extremities: - Normal muscle tone, strength bilateral upper extremities 5/5, lower extremities 5/5  Lymphadenopathy:     Cervical: No cervical adenopathy.  Skin:    General: Skin is warm and dry.     Findings: No erythema or rash.  Neurological:     Mental Status: He is alert and oriented to person, place, and time.     Comments: Distal sensation intact to light touch all extremities  Psychiatric:        Mood and Affect: Mood normal.        Behavior: Behavior normal.        Thought Content: Thought content normal.     Comments: Well groomed, good eye contact, normal speech and thoughts     Results for orders placed or performed in visit on 10/11/21  PSA  Result Value Ref Range   PSA 0.36 < OR = 4.00 ng/mL  Hemoglobin A1c  Result Value Ref Range   Hgb A1c MFr Bld 5.0 <5.7 % of total Hgb   Mean Plasma Glucose 97 mg/dL   eAG (mmol/L) 5.4 mmol/L  Lipid panel  Result Value Ref Range   Cholesterol 111 <200 mg/dL  HDL 37 (L) > OR = 40 mg/dL   Triglycerides 77 <150 mg/dL   LDL  Cholesterol (Calc) 58 mg/dL (calc)   Total CHOL/HDL Ratio 3.0 <5.0 (calc)   Non-HDL Cholesterol (Calc) 74 <130 mg/dL (calc)  CBC with Differential/Platelet  Result Value Ref Range   WBC 5.3 3.8 - 10.8 Thousand/uL   RBC 4.90 4.20 - 5.80 Million/uL   Hemoglobin 15.8 13.2 - 17.1 g/dL   HCT 46.7 38.5 - 50.0 %   MCV 95.3 80.0 - 100.0 fL   MCH 32.2 27.0 - 33.0 pg   MCHC 33.8 32.0 - 36.0 g/dL   RDW 12.5 11.0 - 15.0 %   Platelets 161 140 - 400 Thousand/uL   MPV 10.9 7.5 - 12.5 fL   Neutro Abs 3,832 1,500 - 7,800 cells/uL   Lymphs Abs 1,055 850 - 3,900 cells/uL   Absolute Monocytes 360 200 - 950 cells/uL   Eosinophils Absolute 32 15 - 500 cells/uL   Basophils Absolute 21 0 - 200 cells/uL   Neutrophils Relative % 72.3 %   Total Lymphocyte 19.9 %   Monocytes Relative 6.8 %   Eosinophils Relative 0.6 %   Basophils Relative 0.4 %  COMPLETE METABOLIC PANEL WITH GFR  Result Value Ref Range   Glucose, Bld 101 (H) 65 - 99 mg/dL   BUN 15 7 - 25 mg/dL   Creat 0.72 0.70 - 1.22 mg/dL   eGFR 91 > OR = 60 mL/min/1.59m   BUN/Creatinine Ratio NOT APPLICABLE 6 - 22 (calc)   Sodium 140 135 - 146 mmol/L   Potassium 4.1 3.5 - 5.3 mmol/L   Chloride 101 98 - 110 mmol/L   CO2 30 20 - 32 mmol/L   Calcium 9.0 8.6 - 10.3 mg/dL   Total Protein 6.0 (L) 6.1 - 8.1 g/dL   Albumin 4.0 3.6 - 5.1 g/dL   Globulin 2.0 1.9 - 3.7 g/dL (calc)   AG Ratio 2.0 1.0 - 2.5 (calc)   Total Bilirubin 1.2 0.2 - 1.2 mg/dL   Alkaline phosphatase (APISO) 98 35 - 144 U/L   AST 21 10 - 35 U/L   ALT 17 9 - 46 U/L      Assessment & Plan:   Problem List Items Addressed This Visit     Pseudogout   Osteoarthritis of multiple joints    [facility] pain, also with pseudogout. - Continue Tylenol regularly dosing  Meds per Rheum previous Prednisone - RICE therapy      Hyperlipidemia    Controlled cholesterol on lifestyle Last lipid panel 09/2021 Calculated ASCVD 10 yr risk  score elevated due to age, HTN  Plan: 1. Briefly reviewed ASCVD risk - consider low dose statin for future risk reduction, but agree to defer for now, patient not interested in starting new med 2. Continue ASA 840mfor primary ASCVD risk reduction 3. Encourage improved lifestyle - low carb/cholesterol, reduce portion size, continue improving regular exercise 4. Follow-up yearly lipids      Relevant Medications   enalapril (VASOTEC) 20 MG tablet   Essential (primary) hypertension    Controlled HTN Rare DBP readings >100 - Home BP readings normal, detailed log - occasional elevated SBP before takes med  No known complications    Plan:  1. Continue Enalapril 2036mID 2. Encourage improved lifestyle - low sodium diet, regular exercise 3. Continue monitor BP outside office, bring readings to next visit, if persistently >140/90 or new symptoms notify office sooner  Relevant Medications   enalapril (VASOTEC) 20 MG tablet   Esophagitis, reflux    Stable chronic problem S/p gastric outlet obstruction, bypass surgery Refill Omeprazole PPI 51m BID      Relevant Medications   omeprazole (PRILOSEC) 20 MG capsule   BPH with obstruction/lower urinary tract symptoms    Improved BPH on Finasteride and dose adjust Tamsulosin History Variety of Severe LUTS and nocturia primarily bothering him - Last PSA 0.36 (09/2021) with anticipated drop in PSA while on Finasteride - Last DRE uncertain - No known personal/family history of prostate CA  Plan: 1. Tamsulosin 0.434mx 2 daily 2. Finasteride 97m109maily  Follow-up as planned. Discussed that if not noticeable improvement in symptoms - may have to consider consultation with Urologist      Relevant Medications   finasteride (PROSCAR) 5 MG tablet   tamsulosin (FLOMAX) 0.4 MG CAPS capsule   Blood glucose elevated   Relevant Medications   glucose blood (ACCU-CHEK AVIVA PLUS) test strip   Allergic rhinitis    Continue OTC Allegra ADD  Monelukast 61m62mghtly      Relevant Medications   montelukast (SINGULAIR) 10 MG tablet   Other Visit Diagnoses     Annual physical exam    -  Primary       Updated Health Maintenance information Reviewed recent lab results with patient Encouraged improvement to lifestyle with diet and exercise Goal maintain weight   Meds ordered this encounter  Medications   montelukast (SINGULAIR) 10 MG tablet    Sig: Take 1 tablet (10 mg total) by mouth at bedtime.    Dispense:  90 tablet    Refill:  3   enalapril (VASOTEC) 20 MG tablet    Sig: Take 1 tablet (20 mg total) by mouth 2 (two) times daily.    Dispense:  180 tablet    Refill:  3    Add refills   finasteride (PROSCAR) 5 MG tablet    Sig: Take 1 tablet (5 mg total) by mouth daily.    Dispense:  90 tablet    Refill:  3    Add refills   omeprazole (PRILOSEC) 20 MG capsule    Sig: Take 1 capsule (20 mg total) by mouth 2 (two) times daily before a meal.    Dispense:  180 capsule    Refill:  3    Add refills on file   tamsulosin (FLOMAX) 0.4 MG CAPS capsule    Sig: Take 2 capsules (0.8 mg total) by mouth daily after breakfast.    Dispense:  180 capsule    Refill:  3    Add refills on file   glucose blood (ACCU-CHEK AVIVA PLUS) test strip    Sig: Use to check once per Brewer    Dispense:  100 strip    Refill:  12      Follow up plan: Return in about 6 months (around 04/18/2022) for 6 month follow-up HTN.  AlexNobie Putnam SHebgen Lake [facility]ates[facility] 10/18/2021, 9:04 AM

## 2021-10-18 NOTE — Assessment & Plan Note (Signed)
Controlled cholesterol on lifestyle Last lipid panel 09/2021 Calculated ASCVD 10 yr risk score elevated due to age, HTN  Plan: 1. Briefly reviewed ASCVD risk - consider low dose statin for future risk reduction, but agree to defer for now, patient not interested in starting new med 2. Continue ASA 81mg  for primary ASCVD risk reduction 3. Encourage improved lifestyle - low carb/cholesterol, reduce portion size, continue improving regular exercise 4. Follow-up yearly lipids

## 2021-10-18 NOTE — Assessment & Plan Note (Signed)
Continue OTC Allegra ADD Monelukast 10mg  nightly

## 2021-10-18 NOTE — Assessment & Plan Note (Signed)
Improved BPH on Finasteride and dose adjust Tamsulosin History Variety of Severe LUTS and nocturia primarily bothering him - Last PSA 0.36 (09/2021) with anticipated drop in PSA while on Finasteride - Last DRE uncertain - No known personal/family history of prostate CA  Plan: 1. Tamsulosin 0.4mg  x 2 daily 2. Finasteride 5mg  daily  Follow-up as planned. Discussed that if not noticeable improvement in symptoms - may have to consider consultation with Urologist

## 2021-10-18 NOTE — Assessment & Plan Note (Signed)
Stable chronic problem S/p gastric outlet obstruction, bypass surgery Refill Omeprazole PPI 20mg BID 

## 2021-10-26 DIAGNOSIS — E782 Mixed hyperlipidemia: Secondary | ICD-10-CM

## 2021-10-26 DIAGNOSIS — J45909 Unspecified asthma, uncomplicated: Secondary | ICD-10-CM | POA: Diagnosis not present

## 2021-10-26 DIAGNOSIS — M159 Polyosteoarthritis, unspecified: Secondary | ICD-10-CM

## 2021-10-26 DIAGNOSIS — I10 Essential (primary) hypertension: Secondary | ICD-10-CM | POA: Diagnosis not present

## 2021-11-28 DIAGNOSIS — M159 Polyosteoarthritis, unspecified: Secondary | ICD-10-CM | POA: Diagnosis not present

## 2021-11-28 DIAGNOSIS — M1180 Other specified crystal arthropathies, unspecified site: Secondary | ICD-10-CM | POA: Diagnosis not present

## 2021-12-19 ENCOUNTER — Ambulatory Visit (INDEPENDENT_AMBULATORY_CARE_PROVIDER_SITE_OTHER): Payer: Medicare Other

## 2021-12-19 ENCOUNTER — Telehealth: Payer: Medicare Other

## 2021-12-19 DIAGNOSIS — M159 Polyosteoarthritis, unspecified: Secondary | ICD-10-CM

## 2021-12-19 DIAGNOSIS — R0602 Shortness of breath: Secondary | ICD-10-CM

## 2021-12-19 DIAGNOSIS — E782 Mixed hyperlipidemia: Secondary | ICD-10-CM

## 2021-12-19 DIAGNOSIS — G894 Chronic pain syndrome: Secondary | ICD-10-CM

## 2021-12-19 DIAGNOSIS — J45909 Unspecified asthma, uncomplicated: Secondary | ICD-10-CM

## 2021-12-19 DIAGNOSIS — I10 Essential (primary) hypertension: Secondary | ICD-10-CM

## 2021-12-19 NOTE — Chronic Care Management (AMB) (Signed)
Chronic Care Management   CCM RN Visit Note  12/19/2021 Name: Zachary Brewer MRN: [id number] DOB: 1941/02/12  Subjective: Zachary Brewer is a 81 y.o. year old male who is a primary care patient of [name], DO. The care management team was consulted for assistance with disease management and care coordination needs.    Engaged with patient by telephone for follow up visit in response to provider referral for case management and/or care coordination services.   Consent to Services:  The patient was given information about Chronic Care Management services, agreed to services, and gave verbal consent prior to initiation of services.  Please see initial visit note for detailed documentation.   Patient agreed to services and verbal consent obtained.   Assessment: Review of patient past medical history, allergies, medications, health status, including review of consultants reports, laboratory and other test data, was performed as part of comprehensive evaluation and provision of chronic care management services.   SDOH (Social Determinants of Health) assessments and interventions performed:    CCM Care Plan  No Known Allergies  Outpatient Encounter Medications as of 12/19/2021  Medication Sig   Accu-Chek FastClix Lancets MISC Use to check blood sugar up to 1 x weekly   aspirin EC 81 MG tablet Take 81 mg by mouth daily.   Blood Glucose Monitoring Suppl (ACCU-CHEK AVIVA PLUS) w/Device KIT Use glucometer to check blood sugar 1 x weekly as advised   Calcium Carb-Cholecalciferol (CALCIUM-VITAMIN D) 500-400 MG-UNIT TABS TAKE ONE TABLET BY MOUTH TWICE DAILY WITH MEALS (Patient not taking: Reported on 02/05/2021)   Cholecalciferol (VITAMIN D3) 1000 units CAPS Take 1,000 Units by mouth daily.   enalapril (VASOTEC) 20 MG tablet Take 1 tablet (20 mg total) by mouth 2 (two) times daily.   fexofenadine (ALLEGRA) 180 MG tablet Take 180 mg by mouth daily.   finasteride (PROSCAR) 5 MG tablet  Take 1 tablet (5 mg total) by mouth daily.   Glucosamine-Chondroit-Vit C-Mn (GLUCOSAMINE 1500 COMPLEX PO) Take 1,500 mg by mouth daily.   glucose blood (ACCU-CHEK AVIVA PLUS) test strip Use to check once per day   montelukast (SINGULAIR) 10 MG tablet Take 1 tablet (10 mg total) by mouth at bedtime.   Multiple Vitamin (MULTIVITAMIN) tablet Take 1 tablet by mouth daily. Reported on 11/21/2015   omeprazole (PRILOSEC) 20 MG capsule Take 1 capsule (20 mg total) by mouth 2 (two) times daily before a meal.   prednisoLONE acetate (PRED FORTE) 1 % ophthalmic suspension SMARTSIG:1 Drop(s) Left Eye 6 Times Daily   psyllium (METAMUCIL) 58.6 % powder Take 1 packet by mouth daily.   tamsulosin (FLOMAX) 0.4 MG CAPS capsule Take 2 capsules (0.8 mg total) by mouth daily after breakfast.   No facility-administered encounter medications on file as of 12/19/2021.    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   Gastric outlet obstruction    Swallowing difficulty    Allergic rhinitis 05/30/2015   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/30/2015   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  Bradycardia 05/30/2015   Essential (primary) hypertension 05/30/2015   Esophagitis, reflux 05/30/2015   Lipoma of neck 05/30/2015   Blood glucose elevated 05/30/2015   Lump in scrotum 05/30/2015    Conditions to be addressed/monitored:HTN, HLD, and airway hypersensitivity and chronic pain   Care  Plan : RNCM: General Plan of Care (Adult) for Chronic Disease Management and Care Coordination Needs  Updates made by Vanita Ingles, RN since 12/19/2021 12:00 AM     Problem: RNCM: Development of Plan of Care for Chronic Disease Management (HTN, HLD, Airway hypersensititvity, chronic pain- right knee)   Priority: High     Long-Range Goal: RNCM: Effective Management  of Plan of Care for Chronic Disease Management (HTN, HLD, Airway hypersensititvity, chronic pain- right knee)   Start Date: 10/10/2021  Expected End Date: 10/10/2022  Priority: High  Note:   Current Barriers:  Knowledge Deficits related to plan of care for management of HTN, HLD, Pulmonary Disease, and Chronic pain   Chronic Disease Management support and education needs related to HTN, HLD, Pulmonary Disease, and Chronic pain  RNCM Clinical Goal(s):  Patient will verbalize basic understanding of HTN, HLD, Pulmonary Disease, and Chronic Pain disease process and self health management plan as evidenced by stable blood pressures, labs wnl, taking medications as directed, compliance with plan of care and working with the CCM team to effectively mange health and well being take all medications exactly as prescribed and will call provider for medication related questions as evidenced by compliance and calling for refills before running out    attend all scheduled medical appointments: 04-18-2022 at 0800 am as evidenced by keeping appointments and calling for needed schedule changes         demonstrate improved and ongoing adherence to prescribed treatment plan for HTN, HLD, Pulmonary Disease, and Chronic pain  as evidenced by stable conditions, no exacerbations of pain, VS WNL demonstrate a decrease in Osteoarthritis exacerbations  as evidenced by controlled pain in right knee and hand pain  demonstrate ongoing self health care management ability for effective management of chronic conditions as  as evidenced by working with the CCM team  through collaboration with Consulting civil engineer, provider, and care team.   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 12-19-2021: The patient uses air purifiers and humidifier to help with his airway hypersensitivity. Especially uses when vacuuming. The patient denies any acute findings today. Will continue to monitor for changes.  Provided patient with basic written and verbal airway hypersensitivity  education on self care/management/and exacerbation prevention Advised patient to track and manage hypersensitivity triggers. 12-19-2021: Review and education provided.  Provided written and verbal instructions on pursed lip breathing and utilized returned demonstration as teach back Provided instruction about proper use of medications used for management of airway hypersensitivity  including inhalers Advised patient to self assesses airway hypersensitivity  action plan zone and make appointment with provider if in the yellow zone for 48 hours without improvement Advised patient to engage in light exercise as tolerated 3-5 days a  week to aid in the the management of Airway hypersensitivity  Provided education about and advised patient to utilize infection prevention strategies to reduce risk of respiratory infection. 12-19-2021: Review of factors that cause increase risk of infections and respiratory illness Discussed the  importance of adequate rest and management of fatigue with Airway Hypersensitivity  Screening for signs and symptoms of depression related to chronic disease state  Assessed social determinant of health barriers  Hyperlipidemia:  (Status: Goal on Track (progressing): YES.) Long Term Goal  Lab Results  Component Value Date   CHOL 111 10/14/2021   HDL 37 (L) 10/14/2021   LDLCALC 58 10/14/2021   TRIG 77 10/14/2021   CHOLHDL 3.0 10/14/2021     Medication review performed; medication list updated in electronic medical record.  Provider established cholesterol goals reviewed; Counseled on importance of regular laboratory monitoring as prescribed. 12-19-2021: Review of lab work and having on a regular basis.  Provided HLD educational materials; Reviewed role and benefits of statin for ASCVD risk reduction; Discussed strategies to manage statin-induced myalgias; Reviewed importance of limiting foods high in cholesterol;  Hypertension: (Status: Goal on Track (progressing): YES.) Last practice recorded BP readings:  BP Readings from Last 3 Encounters:  10/18/21 124/75  08/08/21 128/83  04/16/21 (!) 139/56  Most recent eGFR/CrCl: No results found for: EGFR  No components found for: CRCL  Evaluation of current treatment plan related to hypertension self management and patient's adherence to plan as established by provider. 12-19-2021: The patient has good control of his HTN. Denies any issues with HTN or heart health. Will continue to monitor;   Provided education to patient re: stroke prevention, s/s of heart attack and stroke; Reviewed prescribed diet heart healthy. 12-19-2021: The patient is compliant with a heart healthy diet  Reviewed medications with patient and discussed importance of compliance. 12-19-2021: The patient is complaint with medications;  Discussed plans with patient for ongoing care management follow up and provided patient with direct contact information for care management  team; Advised patient, providing education and rationale, to monitor blood pressure daily and record, calling PCP for findings outside established parameters. 10-10-2021: The patient was worried about some of his blood pressure readings. The patients reading this am was 112/68. The patient states that he doesn't like it when top number is 140's. Education on goal of systolic of <149 and diastolic <70. Review of low blood pressures and to monitor for orthostatic hypotension. The patient verbalized feeling better since having some parameters to go by. Education and support given.  Advised patient to discuss blood pressure trends  with provider; Provided education on prescribed diet heart healthy;  Discussed complications of poorly controlled blood pressure such as heart disease, stroke, circulatory complications, vision complications, kidney impairment, sexual dysfunction;   Pain:  (Status: Goal on Track (progressing): YES.) Long Term Goal  Pain assessment performed. Patient states no pain today. He has to monitor for changes in his right knee and right hand. He knows when he has overdone it. 12-19-2021: The patient denies any pain or discomfort today. Will continue to monitor for changes Medications reviewed. 12-19-2021: The patient takes Tylenol and ASA when needed for pain relief Reviewed provider established plan for pain management. 12-19-2021: The patient is compliant with plan of care for pain. Uses a hot wax machine periodically for hand pain and discomfort. Discussed importance of adherence to all scheduled medical appointments. Next pcp visit on 04-18-2022; Counseled on the importance of reporting any/all new or changed pain symptoms or  management strategies to pain management provider; Advised patient to report to care team affect of pain on daily activities; Discussed use of relaxation techniques and/or diversional activities to assist with pain reduction (distraction, imagery, relaxation,  massage, acupressure, TENS, heat, and cold application; Reviewed with patient prescribed pharmacological and nonpharmacological pain relief strategies; Advised patient to discuss unresolved pain, changes in level or intensity of pain with provider;  Patient Goals/Self-Care Activities: Take medications as prescribed   Attend all scheduled provider appointments Call pharmacy for medication refills 3-7 days in advance of running out of medications Attend church or other social activities Perform all self care activities independently  Perform IADL's (shopping, preparing meals, housekeeping, managing finances) independently Call provider office for new concerns or questions  Work with the social worker to address care coordination needs and will continue to work with the clinical team to address health care and disease management related needs call the Suicide and Crisis Lifeline: 988 call the [place] National Suicide Prevention Lifeline: 8201373727 or TTY: 701-232-0098 TTY (670)708-4335) to talk to a trained counselor call 1-800-273-TALK (toll free, 24 hour hotline) if experiencing a Mental Health or [place]  check blood pressure daily choose a place to take my blood pressure (home, clinic or office, retail store) write blood pressure results in a log or diary learn about high blood pressure keep a blood pressure log take blood pressure log to all doctor appointments call doctor for signs and symptoms of high blood pressure develop an action plan for high blood pressure keep all doctor appointments take medications for blood pressure exactly as prescribed begin an exercise program report new symptoms to your doctor eat more whole grains, fruits and vegetables, lean meats and healthy fats - call for medicine refill 2 or 3 days before it runs out - take all medications exactly as prescribed - call doctor with any symptoms you believe are related to your medicine - call  doctor when you experience any new symptoms - go to all doctor appointments as scheduled - adhere to prescribed diet: Heart healthy diet        Plan:Telephone follow up appointment with care management team member scheduled for:  02-06-2022 at 49 am  Noreene Larsson RN, MSN, [place] Mobile: [phone]

## 2021-12-19 NOTE — Patient Instructions (Signed)
Visit Information  Thank you for taking time to visit with me today. Please don't hesitate to contact me if I can be of assistance to you before our next scheduled telephone appointment.  Following are the goals we discussed today:  RNCM Clinical Goal(s):  Patient will verbalize basic understanding of HTN, HLD, Pulmonary Disease, and Chronic Pain disease process and self health management plan as evidenced by stable blood pressures, labs wnl, taking medications as directed, compliance with plan of care and working with the CCM team to effectively mange health and well being take all medications exactly as prescribed and will call provider for medication related questions as evidenced by compliance and calling for refills before running out    attend all scheduled medical appointments: 04-18-2022 at 0800 am as evidenced by keeping appointments and calling for needed schedule changes         demonstrate improved and ongoing adherence to prescribed treatment plan for HTN, HLD, Pulmonary Disease, and Chronic pain  as evidenced by stable conditions, no exacerbations of pain, VS WNL demonstrate a decrease in Osteoarthritis exacerbations  as evidenced by controlled pain in right knee and hand pain  demonstrate ongoing self health care management ability for effective management of chronic conditions as  as evidenced by working with the CCM team through collaboration with Consulting civil engineer, provider, and care team.    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 12-19-2021: The patient uses air purifiers and humidifier to help with his airway hypersensitivity. Especially uses when vacuuming. The patient denies any acute findings today. Will continue to monitor for changes.  Provided patient with basic written and verbal airway hypersensitivity  education on self care/management/and exacerbation prevention Advised patient to track and manage hypersensitivity triggers. 12-19-2021: Review and education provided.  Provided written and verbal instructions on pursed lip breathing and utilized returned demonstration as teach back Provided instruction about proper use of medications used for management of airway hypersensitivity  including inhalers Advised patient to self assesses airway hypersensitivity  action plan zone and make appointment with provider if in the yellow zone for 48 hours without improvement Advised patient to engage in light exercise as tolerated 3-5 days a week to aid in the the management of Airway hypersensitivity  Provided education about and advised patient to utilize infection prevention strategies to reduce risk of respiratory infection. 12-19-2021: Review of factors that cause increase risk of infections and respiratory illness Discussed the importance of adequate rest and management of fatigue with Airway Hypersensitivity  Screening for signs and symptoms of depression related to chronic disease state  Assessed social determinant of health barriers   Hyperlipidemia:  (Status: Goal on Track (progressing): YES.) Long Term Goal       Lab Results  Component Value Date    CHOL 111 10/14/2021    HDL 37 (L) 10/14/2021    LDLCALC 58 10/14/2021    TRIG 77 10/14/2021    CHOLHDL 3.0 10/14/2021      Medication review performed; medication list updated  in electronic medical record.  Provider established  cholesterol goals reviewed; Counseled on importance of regular laboratory monitoring as prescribed. 12-19-2021: Review of lab work and having on a regular basis.  Provided HLD educational materials; Reviewed role and benefits of statin for ASCVD risk reduction; Discussed strategies to manage statin-induced myalgias; Reviewed importance of limiting foods high in cholesterol;   Hypertension: (Status: Goal on Track (progressing): YES.) Last practice recorded BP readings:     BP Readings from Last 3 Encounters:  10/18/21 124/75  08/08/21 128/83  04/16/21 (!) 139/56  Most recent eGFR/CrCl: No results found for: EGFR  No components found for: CRCL   Evaluation of current treatment plan related to hypertension self management and patient's adherence to plan as established by provider. 12-19-2021: The patient has good control of his HTN. Denies any issues with HTN or heart health. Will continue to monitor;   Provided education to patient re: stroke prevention, s/s of heart attack and stroke; Reviewed prescribed diet heart healthy. 12-19-2021: The patient is compliant with a heart healthy diet  Reviewed medications with patient and discussed importance of compliance. 12-19-2021: The patient is complaint with medications;  Discussed plans with patient for ongoing care management follow up and provided patient with direct contact information for care management team; Advised patient, providing education and rationale, to monitor blood pressure daily and record, calling PCP for findings outside established parameters. 10-10-2021: The patient was worried about some of his blood pressure readings. The patients reading this am was 112/68. The patient states that he doesn't like it when top number is 140's. Education on goal of systolic of <542 and diastolic <70. Review of low blood pressures and to monitor for orthostatic hypotension. The patient verbalized feeling better since having some parameters to go by.  Education and support given.  Advised patient to discuss blood pressure trends  with provider; Provided education on prescribed diet heart healthy;  Discussed complications of poorly controlled blood pressure such as heart disease, stroke, circulatory complications, vision complications, kidney impairment, sexual dysfunction;    Pain:  (Status: Goal on Track (progressing): YES.) Long Term Goal  Pain assessment performed. Patient states no pain today. He has to monitor for changes in his right knee and right hand. He knows when he has overdone it. 12-19-2021: The patient denies any pain or discomfort today. Will continue to monitor for changes Medications reviewed. 12-19-2021: The patient takes Tylenol and ASA when needed for pain relief Reviewed provider established plan for pain management. 12-19-2021: The patient is compliant with plan of care for pain. Uses a hot wax machine periodically for hand pain and discomfort. Discussed importance of adherence to all scheduled medical appointments. Next pcp visit on 04-18-2022; Counseled on the importance of reporting any/all new or changed pain symptoms or management strategies to pain management provider; Advised patient to report to care team affect of pain on daily activities; Discussed use of relaxation techniques and/or diversional activities to assist with pain reduction (distraction, imagery, relaxation, massage, acupressure, TENS, heat, and cold application; Reviewed with patient prescribed pharmacological and nonpharmacological pain relief strategies; Advised patient to discuss unresolved pain, changes in level or intensity of pain with provider;   Patient Goals/Self-Care Activities: Take medications as prescribed   Attend all scheduled provider appointments Call pharmacy for medication refills 3-7 days in advance of running out of medications Attend church or other social activities Perform all self care activities independently  Perform IADL's  (shopping, preparing meals, housekeeping, managing finances) independently Call provider office  for new concerns or questions  Work with the Education officer, museum to address care coordination needs and will continue to work with the clinical team to address health care and disease management related needs call the Suicide and Crisis Lifeline: 988 call the [place] National Suicide Prevention Lifeline: (703) 621-8107 or TTY: 240-676-5949 TTY (606) 286-5464) to talk to a trained counselor call 1-800-273-TALK (toll free, 24 hour hotline) if experiencing a Mental Health or [place]  check blood pressure daily choose a place to take my blood pressure (home, clinic or office, retail store) write blood pressure results in a log or diary learn about high blood pressure keep a blood pressure log take blood pressure log to all doctor appointments call doctor for signs and symptoms of high blood pressure develop an action plan for high blood pressure keep all doctor appointments take medications for blood pressure exactly as prescribed begin an exercise program report new symptoms to your doctor eat more whole grains, fruits and vegetables, lean meats and healthy fats - call for medicine refill 2 or 3 days before it runs out - take all medications exactly as prescribed - call doctor with any symptoms you believe are related to your medicine - call doctor when you experience any new symptoms - go to all doctor appointments as scheduled - adhere to prescribed diet: Heart healthy diet           Our next appointment is by telephone on 02-06-2022 at 1030 am   Please call the care guide team at [phone] if you need to cancel or reschedule your appointment.   If you are experiencing a Mental Health or [place] or need someone to talk to, please call the Suicide and Crisis Lifeline: 988 call the [place] National Suicide Prevention Lifeline: (505)067-3781 or TTY: 5751911153 TTY  256-041-6746) to talk to a trained counselor call 1-800-273-TALK (toll free, 24 hour hotline)   Patient verbalizes understanding of instructions and care plan provided today and agrees to view in [place]. Active MyChart status confirmed with patient.    Noreene Larsson RN, MSN, [place] Mobile: [phone]

## 2021-12-24 DIAGNOSIS — E782 Mixed hyperlipidemia: Secondary | ICD-10-CM

## 2021-12-24 DIAGNOSIS — I10 Essential (primary) hypertension: Secondary | ICD-10-CM | POA: Diagnosis not present

## 2021-12-24 DIAGNOSIS — M159 Polyosteoarthritis, unspecified: Secondary | ICD-10-CM

## 2021-12-24 DIAGNOSIS — J45909 Unspecified asthma, uncomplicated: Secondary | ICD-10-CM

## 2021-12-25 ENCOUNTER — Other Ambulatory Visit: Payer: Self-pay | Admitting: Family Medicine

## 2021-12-25 DIAGNOSIS — I10 Essential (primary) hypertension: Secondary | ICD-10-CM

## 2021-12-25 DIAGNOSIS — R739 Hyperglycemia, unspecified: Secondary | ICD-10-CM

## 2021-12-25 DIAGNOSIS — N401 Enlarged prostate with lower urinary tract symptoms: Secondary | ICD-10-CM

## 2021-12-25 DIAGNOSIS — K2100 Gastro-esophageal reflux disease with esophagitis, without bleeding: Secondary | ICD-10-CM

## 2021-12-25 DIAGNOSIS — J3089 Other allergic rhinitis: Secondary | ICD-10-CM

## 2021-12-25 MED ORDER — ACCU-CHEK AVIVA PLUS VI STRP: ORAL_STRIP | 12 refills | Status: DC

## 2021-12-25 MED ORDER — ENALAPRIL MALEATE 20 MG PO TABS: 20.0000 mg | ORAL_TABLET | Freq: Two times a day (BID) | ORAL | 3 refills | Status: DC

## 2021-12-25 MED ORDER — OMEPRAZOLE 20 MG PO CPDR: 20.0000 mg | DELAYED_RELEASE_CAPSULE | Freq: Two times a day (BID) | ORAL | 3 refills | Status: DC

## 2021-12-25 MED ORDER — MONTELUKAST SODIUM 10 MG PO TABS: 10.0000 mg | ORAL_TABLET | Freq: Every day | ORAL | 3 refills | Status: DC

## 2021-12-25 MED ORDER — TAMSULOSIN HCL 0.4 MG PO CAPS: 0.8000 mg | ORAL_CAPSULE | Freq: Every day | ORAL | 3 refills | Status: DC

## 2021-12-25 MED ORDER — FINASTERIDE 5 MG PO TABS: 5.0000 mg | ORAL_TABLET | Freq: Every day | ORAL | 3 refills | Status: DC

## 2021-12-25 NOTE — Telephone Encounter (Signed)
Requesting medication to be sent to different pharmacy due [facility] closed. ?Requested Prescriptions  ?Pending Prescriptions Disp Refills  ?? tamsulosin (FLOMAX) 0.4 MG CAPS capsule 180 capsule 3  ?  Sig: Take 2 capsules (0.8 mg total) by mouth daily after breakfast.  ?  ? Urology: Alpha-Adrenergic Blocker Passed - 12/25/2021  4:46 PM  ?  ?  Passed - PSA in normal range and within 360 days  ?  PSA  ?Date Value Ref Range Status  ?10/14/2021 0.36 < OR = 4.00 ng/mL Final  ?  Comment:  ?  The total PSA value from this assay system is  ?standardized against the WHO standard. The test  ?result will be approximately 20% lower when compared  ?to the equimolar-standardized total PSA (Beckman  ?Coulter). Comparison of serial PSA results should be  ?interpreted with this fact in mind. ?. ?This test was performed using the Siemens  ?chemiluminescent method. Values obtained from  ?different assay methods cannot be used ?interchangeably. PSA levels, regardless of ?value, should not be interpreted as absolute ?evidence of the presence or absence of disease. ?  ?   ?  ?  Passed - Last BP in normal range  ?  BP Readings from Last 1 Encounters:  ?10/18/21 124/75  ?   ?  ?  Passed - Valid encounter within last 12 months  ?  Recent Outpatient Visits   ?      ? 2 months ago Annual physical exam  ? [place], DO  ? 8 months ago Essential (primary) hypertension  ? [place], DO  ? 1 year ago Essential (primary) hypertension  ? [place], DO  ? 1 year ago Annual physical exam  ? [place], DO  ? 1 year ago Essential (primary) hypertension  ? [place], DO  ?  ?  ?Future Appointments   ?        ? In 1 month  [facility], [place]  ? In 3 months Parks Ranger, Devonne Doughty, DO [facility]  ?  ? ?  ?  ?   ?? omeprazole (PRILOSEC) 20 MG capsule 180 capsule 3  ?  Sig: Take 1 capsule (20 mg total) by mouth 2 (two) times daily before a meal.  ?  ? Gastroenterology: Proton Pump Inhibitors Passed - 12/25/2021  4:46 PM  ?  ?  Passed - Valid encounter within last 12 months  ?  Recent Outpatient Visits   ?      ? 2 months ago Annual physical exam  ? [place], DO  ? 8 months ago Essential (primary) hypertension  ? [place], DO  ? 1 year ago Essential (primary) hypertension  ? [place], DO  ? 1 year ago Annual physical exam  ? [place], DO  ? 1 year ago Essential (primary) hypertension  ? [place], DO  ?  ?  ?Future Appointments   ?        ? In 1 month  [facility], [place]  ? In 3 months Parks Ranger, [facility], [facility]  ?  ? ?  ?  ?  ?? glucose blood (  ACCU-CHEK AVIVA PLUS) test strip 100 strip 12  ?  Sig: Use to check once per day  ?  ? Endocrinology: Diabetes - Testing Supplies Passed - 12/25/2021  4:46 PM  ?  ?  Passed - Valid encounter within last 12 months  ?  Recent Outpatient Visits   ?      ? 2 months ago Annual physical exam  ? [place], DO  ? 8 months ago Essential (primary) hypertension  ? [place], DO  ? 1 year ago Essential (primary) hypertension  ? [place], DO  ? 1 year ago Annual physical exam  ? [place], DO  ? 1 year ago Essential (primary) hypertension  ? [place], DO  ?  ?  ?Future Appointments   ?        ? In 1 month  [facility], [place]  ? In 3 months Parks Ranger, [place] [id number] [facility], [facility]  ?  ? ?  ?   ?  ?? montelukast (SINGULAIR) 10 MG tablet 90 tablet 3  ?  Sig: Take 1 tablet (10 mg total) by mouth at bedtime.  ?  ? Pulmonology:  Leukotriene Inhibitors Passed - 12/25/2021  4:46 PM  ?  ?  Passed - Valid encounter within last 12 months  ?  Recent Outpatient Visits   ?      ? 2 months ago Annual physical exam  ? [place], DO  ? 8 months ago Essential (primary) hypertension  ? [place], DO  ? 1 year ago Essential (primary) hypertension  ? [place], DO  ? 1 year ago Annual physical exam  ? [place], DO  ? 1 year ago Essential (primary) hypertension  ? [place], DO  ?  ?  ?Future Appointments   ?        ? In 1 month  [facility], [place]  ? In 3 months Parks Ranger, Devonne Doughty, DO [facility], [facility]  ?  ? ?  ?  ?  ?? finasteride (PROSCAR) 5 MG tablet 90 tablet 3  ?  Sig: Take 1 tablet (5 mg total) by mouth daily.  ?  ? Urology: 5-alpha Reductase Inhibitors Passed - 12/25/2021  4:46 PM  ?  ?  Passed - PSA in normal range and within 360 days  ?  PSA  ?Date Value Ref Range Status  ?10/14/2021 0.36 < OR = 4.00 ng/mL Final  ?  Comment:  ?  The total PSA value from this assay system is  ?standardized against the WHO standard. The test  ?result will be approximately 20% lower when compared  ?to the equimolar-standardized total PSA (Beckman  ?Coulter). Comparison of serial PSA results should be  ?interpreted with this fact in mind. ?. ?This test was performed using the Siemens  ?chemiluminescent method. Values obtained from  ?different assay methods cannot be used ?interchangeably. PSA levels, regardless of ?value, should not be interpreted as absolute ?evidence of the presence or absence of disease. ?  ?   ?  ?  Passed - Valid encounter within last 12 months  ?  Recent Outpatient  Visits   ?      ?  2 months ago Annual physical exam  ? [place], DO  ? 8 months ago Essential (primary) hypertension  ? [place], DO  ? 1 year ago Essential (primary) hypertension  ? [place], DO  ? 1 year ago Annual physical exam  ? [place], DO  ? 1 year ago Essential (primary) hypertension  ? [place], DO  ?  ?  ?Future Appointments   ?        ? In 1 month  [facility], [place]  ? In 3 months Parks Ranger, Devonne Doughty, DO [facility], [facility]  ?  ? ?  ?  ?  ?? enalapril (VASOTEC) 20 MG tablet 180 tablet 3  ?  Sig: Take 1 tablet (20 mg total) by mouth 2 (two) times daily.  ?  ? Cardiovascular:  ACE Inhibitors Passed - 12/25/2021  4:46 PM  ?  ?  Passed - Cr in normal range and within 180 days  ?  Creat  ?Date Value Ref Range Status  ?10/14/2021 0.72 0.70 - 1.22 mg/dL Final  ?   ?  ?  Passed - K in normal range and within 180 days  ?  Potassium  ?Date Value Ref Range Status  ?10/14/2021 4.1 3.5 - 5.3 mmol/L Final  ?11/27/2013 3.7 3.5 - 5.1 mmol/L Final  ?   ?  ?  Passed - Patient is not pregnant  ?  ?  Passed - Last BP in normal range  ?  BP Readings from Last 1 Encounters:  ?10/18/21 124/75  ?   ?  ?  Passed - Valid encounter within last 6 months  ?  Recent Outpatient Visits   ?      ? 2 months ago Annual physical exam  ? [place], DO  ? 8 months ago Essential (primary) hypertension  ? [place], DO  ? 1 year ago Essential (primary) hypertension  ? [place], DO  ? 1 year ago Annual physical exam  ? [place], DO  ? 1 year ago Essential (primary) hypertension  ? [place], DO  ?  ?  ?Future Appointments   ?        ? In 1 month  [facility], [place]  ? In 3 months Parks Ranger, Devonne Doughty, DO [facility], [place]  ?  ? ?  ?  ?  ? ?

## 2021-12-25 NOTE — Telephone Encounter (Signed)
Medication Refill - Medication:  ?montelukast (SINGULAIR) 10 MG tablet ?enalapril (VASOTEC) 20 MG tablet ?finasteride (PROSCAR) 5 MG tablet ?omeprazole (PRILOSEC) 20 MG capsule ?tamsulosin (FLOMAX) 0.4 MG CAPS capsule ?glucose blood (ACCU-CHEK AVIVA PLUS) test strip ? ?Has the patient contacted their pharmacy? Yes.   ? ?(Agent: If yes, when and what did the pharmacy advise?)  [place] advised patient to call PCP because previous pharmacy [facility]) has closed down, therefore new pharmacy [facility] is requesting a new scipt. Patient requesting 90 day supply  ? ?Preferred Pharmacy (with phone number or street name):  ?[facility] [address] [place], [place], [place] [address] [phone]  ? ?Has the patient been seen for an appointment in the last year OR does the patient have an upcoming appointment? Yes.   ? ?Agent: Please be advised that RX refills may take up to 3 business days. We ask that you follow-up with your pharmacy. ?

## 2021-12-25 NOTE — Telephone Encounter (Signed)
Called patient to confirm requested pharmacy is [facility] and not [facility]. Patient confirmed  ?

## 2021-12-26 DIAGNOSIS — I10 Essential (primary) hypertension: Secondary | ICD-10-CM | POA: Diagnosis not present

## 2021-12-26 DIAGNOSIS — E785 Hyperlipidemia, unspecified: Secondary | ICD-10-CM | POA: Diagnosis not present

## 2021-12-26 DIAGNOSIS — R001 Bradycardia, unspecified: Secondary | ICD-10-CM | POA: Diagnosis not present

## 2021-12-26 DIAGNOSIS — I493 Ventricular premature depolarization: Secondary | ICD-10-CM | POA: Diagnosis not present

## 2022-02-06 ENCOUNTER — Telehealth: Payer: Medicare Other

## 2022-02-06 ENCOUNTER — Ambulatory Visit (INDEPENDENT_AMBULATORY_CARE_PROVIDER_SITE_OTHER): Payer: Medicare Other

## 2022-02-06 DIAGNOSIS — J45909 Unspecified asthma, uncomplicated: Secondary | ICD-10-CM

## 2022-02-06 DIAGNOSIS — M159 Polyosteoarthritis, unspecified: Secondary | ICD-10-CM

## 2022-02-06 DIAGNOSIS — R0602 Shortness of breath: Secondary | ICD-10-CM

## 2022-02-06 DIAGNOSIS — I10 Essential (primary) hypertension: Secondary | ICD-10-CM

## 2022-02-06 DIAGNOSIS — E782 Mixed hyperlipidemia: Secondary | ICD-10-CM

## 2022-02-06 DIAGNOSIS — G894 Chronic pain syndrome: Secondary | ICD-10-CM

## 2022-02-06 NOTE — Patient Instructions (Signed)
Visit Information ? ?Thank you for taking time to visit with me today. Please don't hesitate to contact me if I can be of assistance to you before our next scheduled telephone appointment. ? ?Following are the goals we discussed today:  ?RNCM Clinical Goal(s):  ?Patient will verbalize basic understanding of HTN, HLD, Pulmonary Disease, and Chronic Pain disease process and self health management plan as evidenced by stable blood pressures, labs wnl, taking medications as directed, compliance with plan of care and working with the CCM team to effectively mange health and well being ?take all medications exactly as prescribed and will call provider for medication related questions as evidenced by compliance and calling for refills before running out    ?attend all scheduled medical appointments: 04-18-2022 at 0800 am as evidenced by keeping appointments and calling for needed schedule changes. Reminder given today.         ?demonstrate improved and ongoing adherence to prescribed treatment plan for HTN, HLD, Pulmonary Disease, and Chronic pain  as evidenced by stable conditions, no exacerbations of pain, VS WNL ?demonstrate a decrease in Osteoarthritis exacerbations  as evidenced by controlled pain in right knee and hand pain  ?demonstrate ongoing self health care management ability for effective management of chronic conditions as  as evidenced by working with the CCM team through collaboration with RN Care manager, provider, and care team.  ?  ?Interventions: ?1:1 collaboration with primary care provider regarding development and update of comprehensive plan of care as evidenced by provider attestation and co-signature ?Inter-disciplinary care team collaboration (see longitudinal plan of care) ?Evaluation of current treatment plan related to  self management and patient's adherence to plan as established by provider ?  ?  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 12-19-2021: The patient uses air purifiers and humidifier to help with his airway hypersensitivity. Especially uses when vacuuming. The patient denies any acute findings today. Will continue to monitor for changes. 02-06-2022: The patient denies any acute findings related to his airway hypersensitivity. He is mindful of weather changes and things that trigger exacerbation. On pretty days he has been walking outside and using  his cane to help with balance.  ?Provided patient with basic written and verbal airway hypersensitivity  education on self care/management/and exacerbation prevention ?Advised patient to track and manage hypersensitivity triggers. 02-06-2022: Review and education provided.  ?Provided written and verbal instructions on pursed lip breathing and utilized returned demonstration as teach back ?Provided instruction about proper use of medications used for management of airway hypersensitivity  including inhalers. 02-06-2022: The patient has a good understanding of how to effectively manage respiratory health and well being.  ?Advised patient to self assesses airway hypersensitivity  action plan zone and make appointment with provider if in the yellow zone for 48 hours without improvement ?Advised patient to engage in light exercise as tolerated 3-5 days a week to aid in the the management of Airway hypersensitivity  ?Provided education about and advised patient to utilize infection prevention strategies to reduce risk of respiratory infection. 02-06-2022: Review of factors that cause increase risk of infections and respiratory illness ?Discussed the importance of adequate rest and management of fatigue with Airway Hypersensitivity  ?Screening for signs and symptoms of depression related to chronic disease state  ?Assessed social  determinant of health barriers ?  ?Hyperlipidemia:  (Status:   Goal on Track (progressing): YES.) Long Term Goal  ?     ?Lab Results  ?Component Value Date  ?  CHOL 111 10/14/2021  ?  HDL 37 (L) 10/14/2021  ?  Rosser 58 10/14/2021  ?  TRIG 77 10/14/2021  ?  CHOLHDL 3.0 10/14/2021  ?  ?  ?Medication review performed; medication list updated in electronic medical record.  ?Provider established cholesterol goals reviewed; ?Counseled on importance of regular laboratory monitoring as prescribed. 02-06-2022: Review of lab work and having on a regular basis.  ?Provided HLD educational materials; ?Reviewed role and benefits of statin for ASCVD risk reduction; ?Discussed strategies to manage statin-induced myalgias; ?Reviewed importance of limiting foods high in cholesterol. 02-06-2022: The patient monitors his dietary intake and denies any acute findings with dietary restrictions today; ?  ?Hypertension: (Status: Goal on Track (progressing): YES.) ?Last practice recorded BP readings:  ?   ?BP Readings from Last 3 Encounters:  ?02/06/22 135/83  ?10/18/21 124/75  ?08/08/21 128/83  ?Most recent eGFR/CrCl:  ?     ?Lab Results  ?Component Value Date  ?  EGFR 91 10/14/2021  ?  No components found for: CRCL ?  ?Evaluation of current treatment plan related to hypertension self management and patient's adherence to plan as established by provider. 02-06-2022: The patient has good control of his HTN. Denies any issues with HTN or heart health. Will continue to monitor. Takes his blood pressure readings on a consistent basis. The patient states his blood pressures and heart rate have been stable. Today was 135/83 with a pulse of 70. ?Provided education to patient re: stroke prevention, s/s of heart attack and stroke; ?Reviewed prescribed diet heart healthy. 02-06-2022: The patient is compliant with a heart healthy diet  ?Reviewed medications with patient and discussed importance of compliance. 02-06-2022: The patient is complaint with  medications;  ?Discussed plans with patient for ongoing care management follow up and provided patient with direct contact information for care management team; ?Advised patient, providing education and rationale, to monitor blood pressure daily and record, calling PCP for findings outside established parameters. 10-10-2021: The patient was worried about some of his blood pressure readings. The patients reading this am was 112/68. The patient states that he doesn't like it when top number is 140's. Education on goal of systolic of <154 and diastolic <00. Review of low blood pressures and to monitor for orthostatic hypotension. The patient verbalized feeling better since having some parameters to go by. Education and support given. 02-06-2022: The patient takes his blood pressures on a regular basis and knows to call for abnormal readings.  ?Advised patient to discuss blood pressure trends  with provider; ?Provided education on prescribed diet heart healthy;  ?Discussed complications of poorly controlled blood pressure such as heart disease, stroke, circulatory complications, vision complications, kidney impairment, sexual dysfunction;  ?  ?Pain:  (Status: Goal on Track (progressing): YES.) Long Term Goal  ?Pain assessment performed. Patient states no pain today. He has to monitor for changes in his right knee and right hand. He knows when he has overdone it. 02-06-2022: The patient denies any pain or discomfort today. Will continue to monitor for changes. States that he had been having some back pain and thinks he may have pulled a muscle. States it is much better now. Declined the need to have an appointment with the pcp for follow up. He states he will call for changes or new needs.  ?Medications reviewed. 02-06-2022: The patient takes Tylenol and ASA when  needed for pain relief ?Reviewed provider established plan for pain management. 02-06-2022: The patient is compliant with plan of care for pain. Uses a hot wax  machine periodically for hand pain and discomfort. States when he has flare ups of his hand pain he uses hot wax to help with this. He also has a heating pad and he used that for his back pain but that made it worse. The

## 2022-02-06 NOTE — Chronic Care Management (AMB) (Signed)
?Chronic Care Management  ? ?CCM RN Visit Note ? ?02/06/2022 ?Name: Zachary Brewer MRN: [id number] DOB: June 29, 1941 ? ?Subjective: ?ARIEH BOGUE is a 81 y.o. year old male who is a primary care patient of [name], DO. The care management team was consulted for assistance with disease management and care coordination needs.   ? ?Engaged with patient by telephone for follow up visit in response to provider referral for case management and/or care coordination services.  ? ?Consent to Services:  ?The patient was given information about Chronic Care Management services, agreed to services, and gave verbal consent prior to initiation of services.  Please see initial visit note for detailed documentation.  ? ?Patient agreed to services and verbal consent obtained.  ? ?Assessment: Review of patient past medical history, allergies, medications, health status, including review of consultants reports, laboratory and other test data, was performed as part of comprehensive evaluation and provision of chronic care management services.  ? ?SDOH (Social Determinants of Health) assessments and interventions performed:  ?SDOH Interventions   ? ?Flowsheet Row Most Recent Value  ?SDOH Interventions   ?Physical Activity Interventions Other (Comments)  [Is walking outside on pretty days]  ? ?  ?  ? ?[place] ? ?No Known Allergies ? ?Outpatient Encounter Medications as of 02/06/2022  ?Medication Sig  ? Accu-Chek FastClix Lancets MISC Use to check blood sugar up to 1 x weekly  ? aspirin EC 81 MG tablet Take 81 mg by mouth daily.  ? Blood Glucose Monitoring Suppl (ACCU-CHEK AVIVA PLUS) w/Device KIT Use glucometer to check blood sugar 1 x weekly as advised  ? Calcium Carb-Cholecalciferol (CALCIUM-VITAMIN D) 500-400 MG-UNIT TABS TAKE ONE TABLET BY MOUTH TWICE DAILY WITH MEALS (Patient not taking: Reported on 02/05/2021)  ? Cholecalciferol (VITAMIN D3) 1000 units CAPS Take 1,000 Units by mouth daily.  ? enalapril (VASOTEC) 20  MG tablet Take 1 tablet (20 mg total) by mouth 2 (two) times daily.  ? fexofenadine (ALLEGRA) 180 MG tablet Take 180 mg by mouth daily.  ? finasteride (PROSCAR) 5 MG tablet Take 1 tablet (5 mg total) by mouth daily.  ? Glucosamine-Chondroit-Vit C-Mn (GLUCOSAMINE 1500 COMPLEX PO) Take 1,500 mg by mouth daily.  ? glucose blood (ACCU-CHEK AVIVA PLUS) test strip Use to check once per day  ? montelukast (SINGULAIR) 10 MG tablet Take 1 tablet (10 mg total) by mouth at bedtime.  ? Multiple Vitamin (MULTIVITAMIN) tablet Take 1 tablet by mouth daily. Reported on 11/21/2015  ? omeprazole (PRILOSEC) 20 MG capsule Take 1 capsule (20 mg total) by mouth 2 (two) times daily before a meal.  ? prednisoLONE acetate (PRED FORTE) 1 % ophthalmic suspension SMARTSIG:1 Drop(s) Left Eye 6 Times Daily  ? psyllium (METAMUCIL) 58.6 % powder Take 1 packet by mouth daily.  ? tamsulosin (FLOMAX) 0.4 MG CAPS capsule Take 2 capsules (0.8 mg total) by mouth daily after breakfast.  ? ?No facility-administered encounter medications on file as of 02/06/2022.  ? ? ?Patient Active Problem List  ? Diagnosis Date Noted  ? Calculus of gallbladder without cholecystitis without obstruction 07/06/2018  ? Carpal tunnel syndrome on right 04/02/2018  ? Encounter for long-term (current) use of high-risk medication 01/22/2017  ? Vitamin D deficiency 10/24/2015  ? 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 Chronic fatigue, unspecified 10/03/2015  ? Chronic pain of left wrist 10/03/2015  ? Chronic pain syndrome 09/24/2015  ? Pseudogout 07/31/2015  ? Hyperlipidemia 07/24/2015  ? 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 Gastric outlet obstruction   ? Swallowing difficulty   ? Allergic rhinitis 05/30/2015  ? 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/30/2015  ? 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 Bradycardia 05/30/2015  ? Essential (primary) hypertension 05/30/2015  ? Esophagitis, reflux 05/30/2015  ? Lipoma of neck 05/30/2015  ?  Blood glucose elevated 05/30/2015  ? Lump in scrotum 05/30/2015  ? ? ?Conditions to be addressed/monitored:HTN, HLD, Pulmonary Disease, and chronic pain  ? ?Care Plan : RNCM: General Plan of Care (Adult) for Chronic Disease Management and Care Coordination Needs  ?Updates made by Vanita Ingles, RN since 02/06/2022 12:00 AM  ?  ? ?Problem: RNCM: Development of Plan of Care for Chronic Disease Management (HTN, HLD, Airway hypersensititvity, chronic pain- right knee)   ?Priority: High  ?  ? ?Long-Range Goal: RNCM: Effective Management  of Plan of Care for Chronic Disease Management (HTN, HLD, Airway hypersensititvity, chronic pain- right knee)   ?Start Date: 10/10/2021  ?Expected End Date: 10/10/2022  ?Priority: High  ?Note:   ?Current Barriers:  ?Knowledge Deficits related to plan of care for management of HTN, HLD, Pulmonary Disease, and Chronic pain   ?Chronic Disease Management support and education needs related to HTN, HLD, Pulmonary Disease, and Chronic pain ? ?RNCM Clinical Goal(s):  ?Patient will verbalize basic understanding of HTN, HLD, Pulmonary Disease, and Chronic Pain disease process and self health management plan as evidenced by stable blood pressures, labs wnl, taking medications as directed, compliance with plan of care and working with the CCM team to effectively mange health and well being ?take all medications exactly as prescribed and will call provider for medication related questions as evidenced by compliance and calling for refills before running out    ?attend all scheduled medical appointments: 04-18-2022 at 0800 am as evidenced by keeping appointments and calling for needed schedule changes. Reminder given today.         ?demonstrate improved and ongoing adherence to prescribed treatment plan for HTN, HLD, Pulmonary Disease, and Chronic pain  as evidenced by stable conditions, no exacerbations of pain, VS WNL ?demonstrate a decrease in Osteoarthritis exacerbations  as evidenced by  controlled pain in right knee and hand pain  ?demonstrate ongoing self health care management ability for effective management of chronic conditions as  as evidenced by working with the CCM team through collaboration with Consulting civil engineer, provider, and care team.  ? ?Interventions: ?1:1 collaboration with primary care provider regarding development and update of comprehensive plan of care as evidenced by provider attestation and co-signature ?Inter-disciplinary care team collaboration (see longitudinal plan of care) ?Evaluation of current treatment plan related to  self management and patient's adherence to plan as established by provider ? ? 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 12-19-2021: The patient uses air purifiers and humidifier to help with his airway hypersensitivity. Especially uses when vacuuming. The patient denies any acute findings today. Will continue to monitor for changes. 02-06-2022: The patient denies any acute findings related to his airway hypersensitivity. He is mindful of weather changes and things that trigger exacerbation. On pretty days he has been walking outside and using  his cane to help with balance.  ?Provided patient with basic written and verbal airway hypersensitivity  education on self care/management/and exacerbation prevention ?Advised patient to track and manage hypersensitivity triggers. 02-06-2022: Review  and education provided.  ?Provided written and verbal instructions on pursed lip breathing and utilized returned demonstration as teach back ?Provided instruction about proper use of medications used for management of airway hypersensitivity  including inhalers. 02-06-2022: The patient has a good understanding of how to effectively manage respiratory  health and well being.  ?Advised patient to self assesses airway hypersensitivity  action plan zone and make appointment with provider if in the yellow zone for 48 hours without improvement ?Advised patient to eng

## 2022-02-18 ENCOUNTER — Ambulatory Visit: Payer: Medicare Other

## 2022-02-20 ENCOUNTER — Ambulatory Visit (INDEPENDENT_AMBULATORY_CARE_PROVIDER_SITE_OTHER): Payer: Medicare Other

## 2022-02-20 VITALS — BP 124/56 | HR 110 | Temp 97.8°F | Resp 17 | Ht 67.0 in | Wt 145.0 lb

## 2022-02-20 DIAGNOSIS — Z0000 Encounter for general adult medical examination without abnormal findings: Secondary | ICD-10-CM | POA: Diagnosis not present

## 2022-02-20 DIAGNOSIS — Z23 Encounter for immunization: Secondary | ICD-10-CM | POA: Diagnosis not present

## 2022-02-20 NOTE — Progress Notes (Signed)
? ?Subjective:  ? Zachary Brewer is a 81 y.o. male who presents for Medicare Annual/Subsequent preventive examination. ? ?Review of Systems    ?Per HPI unless specifically indicated below   ?Cardiac Risk Factors include: advanced age (>31mn, >>42women);male gender ? ?   ?Objective:  ?  ?Today's Vitals  ? 02/20/22 0807 02/20/22 0852  ?BP: (!) 124/56   ?Pulse: (!) 110   ?Resp: 17   ?Temp: 97.8 ?F (36.6 ?C)   ?TempSrc: Temporal   ?SpO2: 100%   ?Weight: 145 lb (65.8 kg)   ?Height: _0  (1.702 m)   ?PainSc: 0-No pain 0-No pain  ? ?Body mass index is 22.71 kg/m?. ? ? ?  02/05/2021  ? 11:49 AM 07/12/2019  ? 10:10 AM 05/18/2018  ?  8:22 AM 03/31/2017  ?  2:26 PM 07/09/2016  ?  7:38 AM 07/02/2016  ?  3:00 PM 06/11/2016  ? 10:20 AM  ?Advanced Directives  ?Does Patient Have a Medical Advance Directive? _1  Yes Yes  ?Type of [place]of [place] Village[place] will Living will;Healthcare Power of [place] will  Healthcare Power of [place]Living will [place]Carnot[place]oon[place] will  ?Does patient want to make changes to medical advance directive?    Yes (MAU/Ambulatory/Procedural Areas - Information given)     ?Copy of [place] Chart? No - copy requested No - copy requested No - copy requested  No - copy requested  No - copy requested  ? ? ?Current Medications (verified) ?Outpatient Encounter Medications as of 02/20/2022  ?Medication Sig  ? Accu-Chek FastClix Lancets MISC Use to check blood sugar up to 1 x weekly  ? aspirin EC 81 MG tablet Take 81 mg by mouth daily.  ? Blood Glucose Monitoring Suppl (ACCU-CHEK AVIVA PLUS) w/Device KIT Use glucometer to check blood sugar 1 x weekly as advised  ? Calcium Carb-Cholecalciferol (CALCIUM-VITAMIN D) 500-400 MG-UNIT TABS   ? Cholecalciferol (VITAMIN D3) 1000 units CAPS Take 1,000 Units by mouth daily.  ? enalapril (VASOTEC) 20 MG tablet Take 1 tablet (20 mg total)  by mouth 2 (two) times daily.  ? finasteride (PROSCAR) 5 MG tablet Take 1 tablet (5 mg total) by mouth daily.  ? Glucosamine-Chondroit-Vit C-Mn (GLUCOSAMINE 1500 COMPLEX PO) Take 1,500 mg by mouth daily.  ? glucose blood (ACCU-CHEK AVIVA PLUS) test strip Use to check once per day  ? montelukast (SINGULAIR) 10 MG tablet Take 1 tablet (10 mg total) by mouth at bedtime.  ? Multiple Vitamin (MULTIVITAMIN) tablet Take 1 tablet by mouth daily. Reported on 11/21/2015  ? omeprazole (PRILOSEC) 20 MG capsule Take 1 capsule (20 mg total) by mouth 2 (two) times daily before a meal.  ? psyllium (METAMUCIL) 58.6 % powder Take 1 packet by mouth daily.  ? tamsulosin (FLOMAX) 0.4 MG CAPS capsule Take 2 capsules (0.8 mg total) by mouth daily after breakfast.  ? [DISCONTINUED] fexofenadine (ALLEGRA) 180 MG tablet Take 180 mg by mouth daily. (Patient not taking: Reported on 02/20/2022)  ? [DISCONTINUED] prednisoLONE acetate (PRED FORTE) 1 % ophthalmic suspension SMARTSIG:1 Drop(s) Left Eye 6 Times Daily (Patient not taking: Reported on 02/20/2022)  ? ?No facility-administered encounter medications on file as of 02/20/2022.  ? ? ?Allergies (verified) ?Patient has no known allergies.  ? ?History: ?Past Medical History:  ?Diagnosis Date  ? Allergy   ? Arthritis 05/30/2015  ? Gout - Left knee and ankle  ?  Cervical pain 05/30/2015  ? GERD (gastroesophageal reflux disease)   ? Hyperlipidemia   ? Hypertension   ? Lipoma of neck 05/30/2015  ? Wears dentures   ? full upper and lower (doesn't wear lower)  ? Wears hearing aid   ? bilateral  ? ?Past Surgical History:  ?Procedure Laterality Date  ? CATARACT EXTRACTION W/PHACO Right 06/11/2016  ? Procedure: CATARACT EXTRACTION PHACO AND INTRAOCULAR LENS PLACEMENT (IOC);  Surgeon: Leandrew Koyanagi, MD;  Location: [place];  Service: Ophthalmology;  Laterality: Right;  ? CATARACT EXTRACTION W/PHACO Left 07/09/2016  ? Procedure: CATARACT EXTRACTION PHACO AND INTRAOCULAR LENS PLACEMENT (IOC);   Surgeon: Leandrew Koyanagi, MD;  Location: [place];  Service: Ophthalmology;  Laterality: Left;  MALYUGIN  ? ESOPHAGOGASTRODUODENOSCOPY (EGD) WITH PROPOFOL N/A 06/20/2015  ? Procedure: ESOPHAGOGASTRODUODENOSCOPY (EGD) WITH PROPOFOL with dialation;  Surgeon: Lucilla Lame, MD;  Location: [place];  Service: Endoscopy;  Laterality: N/A;  ? FLEXIBLE BRONCHOSCOPY N/A 06/22/2015  ? Procedure: FLEXIBLE BRONCHOSCOPY;  Surgeon: Allyne Gee, MD;  Location: [facility];  Service: Pulmonary;  Laterality: N/A;  ? GASTRIC OUTLET OBSTRUCTION RELEASE  2013  ? 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  ? UPPER GI ENDOSCOPY    ? ?Family History  ?Problem Relation Age of Onset  ? Diabetes Mother   ? Alzheimer's disease Mother   ? Heart disease Father   ? Diabetes Sister   ? Diabetes Brother   ? Stroke Brother   ? Mental illness Brother   ? Hypertension Paternal Grandfather   ? ?Social History  ? ?Socioeconomic History  ? Marital status: Widowed  ?  Spouse name: Not on file  ? Number of children: 3  ? Years of education: Not on file  ? Highest education level: 6th grade  ?Occupational History  ? Occupation: retired  ?Tobacco Use  ? Smoking status: Never  ? Smokeless tobacco: Never  ?Vaping Use  ? Vaping Use: Never used  ?Substance and Sexual Activity  ? Alcohol use: No  ? Drug use: No  ? Sexual activity: Not on file  ?Other Topics Concern  ? Not on file  ?Social History Narrative  ? Not on file  ? ?Social Determinants of Health  ? ?Financial Resource Strain: Low Risk   ? Difficulty of Paying Living Expenses: Not hard at all  ?Food Insecurity: No Food Insecurity  ? Worried About Charity fundraiser in the Last Year: Never true  ? Ran Out of Food in the Last Year: Never true  ?Transportation Needs: No Transportation Needs  ? Lack of Transportation (Medical): No  ? Lack of Transportation (Non-Medical): No  ?Physical Activity: Insufficiently Active  ? Days of Exercise per Week: 2 days  ? Minutes of Exercise per Session: 20 min   ?Stress: No Stress Concern Present  ? Feeling of Stress : Not at all  ?Social Connections: Moderately Isolated  ? Frequency of Communication with Friends and Family: More than three times a week  ? Frequency of Social Gatherings with Friends and Family: More than three times a week  ? Attends Religious Services: More than 4 times per year  ? Active Member of Clubs or Organizations: No  ? Attends Archivist Meetings: Never  ? Marital Status: Widowed  ? ? ?Tobacco Counseling ?Counseling given: Not Answered ? ? ?Clinical Intake: ? ?Pre-visit preparation completed: No ? ?Pain : No/denies pain ?Pain Score: 0-No pain ? ?  ? ? ? ?How often do you need to have someone help  you when you read instructions, pamphlets, or other written materials from your doctor or pharmacy?: 1 - Never ? ?Diabetic? No   ? ?Interpreter Needed?: No ? ?Information entered by :: Donnie Mesa, CMA ? ? ?Activities of Daily Living ? ?  02/20/2022  ?  8:54 AM 02/20/2022  ?  8:22 AM  ?In your present state of health, do you have any difficulty performing the following activities:  ?Hearing?  1  ?Comment  bilateral hearing aid  ?Vision?  1  ?Difficulty concentrating or making decisions?  0  ?Walking or climbing stairs?  1  ?Dressing or bathing?  0  ?Doing errands, shopping?  0  ?Preparing Food and eating ? N   ?Using the Toilet? N   ?In the past six months, have you accidently leaked urine? N   ?Do you have problems with loss of bowel control? N   ?Managing your Medications? N   ?Managing your Finances? N   ?Housekeeping or managing your Housekeeping? N   ? ? ?Patient Care Team: ?[name], DO as PCP - General (Family Medicine) ?Edrick Kins, MD as Rounding Team (Internal Medicine) ?Vanita Ingles, RN as Case Manager (General Practice) ?Behalal-Bock, Christele                Rheumatology ?Paraschos, Alexander                  Cardiology  ?Benay Pillow [facility] Ophthalmology   ?Indicate any recent Medical Services you may have received from other than [facility] providers in the past year (date may be approximate). ?No hospitalization in the past 12 months  ?   ?Assessment:  ? This is a routi

## 2022-02-21 ENCOUNTER — Ambulatory Visit: Payer: Medicare Other

## 2022-02-23 DIAGNOSIS — M159 Polyosteoarthritis, unspecified: Secondary | ICD-10-CM | POA: Diagnosis not present

## 2022-02-23 DIAGNOSIS — E782 Mixed hyperlipidemia: Secondary | ICD-10-CM

## 2022-02-23 DIAGNOSIS — J45909 Unspecified asthma, uncomplicated: Secondary | ICD-10-CM

## 2022-02-23 DIAGNOSIS — I10 Essential (primary) hypertension: Secondary | ICD-10-CM | POA: Diagnosis not present

## 2022-04-03 ENCOUNTER — Telehealth: Payer: Medicare Other

## 2022-04-18 ENCOUNTER — Encounter: Payer: Self-pay | Admitting: Family Medicine

## 2022-04-18 ENCOUNTER — Ambulatory Visit (INDEPENDENT_AMBULATORY_CARE_PROVIDER_SITE_OTHER): Payer: Medicare Other | Admitting: Family Medicine

## 2022-04-18 ENCOUNTER — Other Ambulatory Visit: Payer: Self-pay | Admitting: Family Medicine

## 2022-04-18 VITALS — BP 125/89 | HR 75 | Ht 67.0 in | Wt 142.4 lb

## 2022-04-18 DIAGNOSIS — G5601 Carpal tunnel syndrome, right upper limb: Secondary | ICD-10-CM

## 2022-04-18 DIAGNOSIS — N138 Other obstructive and reflux uropathy: Secondary | ICD-10-CM | POA: Diagnosis not present

## 2022-04-18 DIAGNOSIS — N401 Enlarged prostate with lower urinary tract symptoms: Secondary | ICD-10-CM

## 2022-04-18 DIAGNOSIS — K2100 Gastro-esophageal reflux disease with esophagitis, without bleeding: Secondary | ICD-10-CM | POA: Diagnosis not present

## 2022-04-18 DIAGNOSIS — E782 Mixed hyperlipidemia: Secondary | ICD-10-CM

## 2022-04-18 DIAGNOSIS — I10 Essential (primary) hypertension: Secondary | ICD-10-CM | POA: Diagnosis not present

## 2022-04-18 DIAGNOSIS — Z0000 Encounter for general adult medical examination without abnormal findings: Secondary | ICD-10-CM

## 2022-04-18 DIAGNOSIS — R739 Hyperglycemia, unspecified: Secondary | ICD-10-CM

## 2022-04-22 DIAGNOSIS — G5601 Carpal tunnel syndrome, right upper limb: Secondary | ICD-10-CM | POA: Diagnosis not present

## 2022-05-07 DIAGNOSIS — I499 Cardiac arrhythmia, unspecified: Secondary | ICD-10-CM | POA: Diagnosis not present

## 2022-05-07 DIAGNOSIS — I10 Essential (primary) hypertension: Secondary | ICD-10-CM | POA: Diagnosis not present

## 2022-05-07 DIAGNOSIS — I493 Ventricular premature depolarization: Secondary | ICD-10-CM | POA: Diagnosis not present

## 2022-05-07 DIAGNOSIS — E785 Hyperlipidemia, unspecified: Secondary | ICD-10-CM | POA: Diagnosis not present

## 2022-05-08 ENCOUNTER — Ambulatory Visit (INDEPENDENT_AMBULATORY_CARE_PROVIDER_SITE_OTHER): Payer: Medicare Other

## 2022-05-08 ENCOUNTER — Telehealth: Payer: Medicare Other

## 2022-05-08 DIAGNOSIS — G5601 Carpal tunnel syndrome, right upper limb: Secondary | ICD-10-CM

## 2022-05-08 DIAGNOSIS — I10 Essential (primary) hypertension: Secondary | ICD-10-CM

## 2022-05-08 DIAGNOSIS — G894 Chronic pain syndrome: Secondary | ICD-10-CM

## 2022-05-08 DIAGNOSIS — E782 Mixed hyperlipidemia: Secondary | ICD-10-CM

## 2022-05-08 DIAGNOSIS — J45909 Unspecified asthma, uncomplicated: Secondary | ICD-10-CM

## 2022-05-08 NOTE — Chronic Care Management (AMB) (Signed)
Chronic Care Management   CCM RN Visit Note  05/08/2022 Name: Zachary Brewer MRN: [id number] DOB: [birth year]  Subjective: Zachary Brewer is a 81 y.o. year old male who is a primary care patient of [name], DO. The care management team was consulted for assistance with disease management and care coordination needs.    Engaged with patient by telephone for follow up visit in response to provider referral for case management and/or care coordination services.   Consent to Services:  The patient was given information about Chronic Care Management services, agreed to services, and gave verbal consent prior to initiation of services.  Please see initial visit note for detailed documentation.   Patient agreed to services and verbal consent obtained.   Assessment: Review of patient past medical history, allergies, medications, health status, including review of consultants reports, laboratory and other test data, was performed as part of comprehensive evaluation and provision of chronic care management services.   SDOH (Social Determinants of Health) assessments and interventions performed:    CCM Care Plan  No Known Allergies  Outpatient Encounter Medications as of 05/08/2022  Medication Sig   Accu-Chek FastClix Lancets MISC Use to check blood sugar up to 1 x weekly   aspirin EC 81 MG tablet Take 81 mg by mouth daily.   Blood Glucose Monitoring Suppl (ACCU-CHEK AVIVA PLUS) w/Device KIT Use glucometer to check blood sugar 1 x weekly as advised   Calcium Carb-Cholecalciferol (CALCIUM-VITAMIN D) 500-400 MG-UNIT TABS    Cholecalciferol (VITAMIN D3) 1000 units CAPS Take 1,000 Units by mouth daily.   enalapril (VASOTEC) 20 MG tablet Take 1 tablet (20 mg total) by mouth 2 (two) times daily.   finasteride (PROSCAR) 5 MG tablet Take 1 tablet (5 mg total) by mouth daily.   Glucosamine-Chondroit-Vit C-Mn (GLUCOSAMINE 1500 COMPLEX PO) Take 1,500 mg by mouth daily.   glucose blood  (ACCU-CHEK AVIVA PLUS) test strip Use to check once per day   montelukast (SINGULAIR) 10 MG tablet Take 1 tablet (10 mg total) by mouth at bedtime.   Multiple Vitamin (MULTIVITAMIN) tablet Take 1 tablet by mouth daily. Reported on 11/21/2015   omeprazole (PRILOSEC) 20 MG capsule Take 1 capsule (20 mg total) by mouth 2 (two) times daily before a meal.   psyllium (METAMUCIL) 58.6 % powder Take 1 packet by mouth daily.   tamsulosin (FLOMAX) 0.4 MG CAPS capsule Take 2 capsules (0.8 mg total) by mouth daily after breakfast.   No facility-administered encounter medications on file as of 05/08/2022.    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   Gastric outlet obstruction    Swallowing difficulty    Allergic rhinitis 05/30/2015   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/30/2015   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   Bradycardia 05/30/2015   Essential (primary) hypertension 05/30/2015   Esophagitis, reflux 05/30/2015   Lipoma of neck 05/30/2015   Blood glucose elevated 05/30/2015   Lump in scrotum 05/30/2015    Conditions to be addressed/monitored:HTN, HLD, Pulmonary Disease, and chronic pain  Care Plan : RNCM: General Plan of Care (Adult) for Chronic Disease Management and Care Coordination Needs  Updates made by Vanita Ingles, RN since 05/08/2022 12:00 AM  Completed 05/08/2022   Problem: RNCM: Development of Plan of Care for Chronic Disease Management (HTN,  HLD, Airway hypersensititvity, chronic pain- right knee) Resolved 05/08/2022  Priority: High     Long-Range Goal: RNCM: Effective Management  of Plan of Care for Chronic Disease Management (HTN, HLD, Airway hypersensititvity, chronic pain- right knee) Completed 05/08/2022  Start Date: 10/10/2021  Expected End Date: 10/10/2022  Priority: High  Note:   Current Barriers: 05-08-2022: Goals met and care plan is being closed. The patient is doing well and denies any new issues except head cold or sinus drainage. He states this has been going on for awhile. He denies any acute distress.  Knowledge Deficits related to plan of care for management of HTN, HLD, Pulmonary Disease, and Chronic pain   Chronic Disease Management support and education needs related to HTN, HLD, Pulmonary Disease, and Chronic pain  RNCM Clinical Goal(s):  Patient will verbalize basic understanding of HTN, HLD, Pulmonary Disease, and Chronic Pain disease process and self health management plan as evidenced by stable blood pressures, labs wnl, taking medications as directed, compliance with plan of care and working with the CCM team to effectively mange health and well being take all medications exactly as prescribed and will call provider for medication related questions as evidenced by compliance and calling for refills before running out    attend all scheduled medical appointments: 04-18-2022 at 0800 am as evidenced by keeping appointments and calling for needed schedule changes. Reminder given today.         demonstrate improved and ongoing adherence to prescribed treatment plan for HTN, HLD, Pulmonary Disease, and Chronic pain  as evidenced by stable conditions, no exacerbations of pain, VS WNL demonstrate a decrease in Osteoarthritis exacerbations  as evidenced by controlled pain in right knee and hand pain  demonstrate ongoing self health care management ability for effective management of chronic conditions as  as evidenced by  working with the CCM team through collaboration with Consulting civil engineer, provider, and care team.   Interventions: 1:1 collaboration with primary care provider regarding development and update of comprehensive plan of care as evidenced by provider attestation and co-signature Inter-disciplinary care team collaboration (see longitudinal plan of care) Evaluation of current treatment plan related to  self management and patient's adherence to plan as established by provider   Airway hypersensitivity (Status: Goal Met.) Long Term Goal  05-08-2022: Goals met and care plan is being closed. Is having some sinus/cold congestion with drainage but the drainage is clear. Denies any acute distress.  Reviewed medications with patient, including use of prescribed maintenance and rescue inhalers, and provided instruction on medication management and the importance of adherence. 12-19-2021: The patient uses air purifiers and humidifier to help with his airway hypersensitivity. Especially uses when vacuuming. The patient denies any acute findings today. Will continue to monitor for changes. 02-06-2022: The patient denies any acute findings related to his airway hypersensitivity. He is mindful of weather changes and things that trigger exacerbation. On pretty days he has been walking outside and using  his cane to help with balance.  Provided patient with basic written and verbal airway hypersensitivity  education on self care/management/and exacerbation prevention Advised patient to track and manage hypersensitivity triggers. 02-06-2022: Review and education provided.  Provided written and verbal instructions on pursed lip breathing  and utilized returned demonstration as teach back Provided instruction about proper use of medications used for management of airway hypersensitivity  including inhalers. 02-06-2022: The patient has a good understanding of how to effectively manage respiratory health and well being.  Advised  patient to self assesses airway hypersensitivity  action plan zone and make appointment with provider if in the yellow zone for 48 hours without improvement Advised patient to engage in light exercise as tolerated 3-5 days a week to aid in the the management of Airway hypersensitivity  Provided education about and advised patient to utilize infection prevention strategies to reduce risk of respiratory infection. 02-06-2022: Review of factors that cause increase risk of infections and respiratory illness Discussed the importance of adequate rest and management of fatigue with Airway Hypersensitivity  Screening for signs and symptoms of depression related to chronic disease state  Assessed social determinant of health barriers  Hyperlipidemia:  (Status: Goal Met.) Long Term Goal 05-08-2022: Goals met and care plan is being closed  Lab Results  Component Value Date   CHOL 111 10/14/2021   HDL 37 (L) 10/14/2021   LDLCALC 58 10/14/2021   TRIG 77 10/14/2021   CHOLHDL 3.0 10/14/2021     Medication review performed; medication list updated in electronic medical record.  Provider established cholesterol goals reviewed; Counseled on importance of regular laboratory monitoring as prescribed. 02-06-2022: Review of lab work and having on a regular basis.  Provided HLD educational materials; Reviewed role and benefits of statin for ASCVD risk reduction; Discussed strategies to manage statin-induced myalgias; Reviewed importance of limiting foods high in cholesterol. 02-06-2022: The patient monitors his dietary intake and denies any acute findings with dietary restrictions today;  Hypertension: (Status: Goal Met.) 05-08-2022: Goals met and care plan is being closed  Last practice recorded BP readings:  BP Readings from Last 3 Encounters:  04/18/22 125/89  02/20/22 (!) 124/56  02/06/22 135/83  Most recent eGFR/CrCl:  Lab Results  Component Value Date   EGFR 91 10/14/2021    No components found for:  CRCL  Evaluation of current treatment plan related to hypertension self management and patient's adherence to plan as established by provider. 02-06-2022: The patient has good control of his HTN. Denies any issues with HTN or heart health. Will continue to monitor. Takes his blood pressure readings on a consistent basis. The patient states his blood pressures and heart rate have been stable. Today was 135/83 with a pulse of 70. Provided education to patient re: stroke prevention, s/s of heart attack and stroke; Reviewed prescribed diet heart healthy. 02-06-2022: The patient is compliant with a heart healthy diet  Reviewed medications with patient and discussed importance of compliance. 02-06-2022: The patient is complaint with medications;  Discussed plans with patient for ongoing care management follow up and provided patient with direct contact information for care management team; Advised patient, providing education and rationale, to monitor blood pressure daily and record, calling PCP for findings outside established parameters. 10-10-2021: The patient was worried about some of his blood pressure readings. The patients reading this am was 112/68. The patient states that he doesn't like it when top number is 140's. Education on goal of systolic of <981 and diastolic <19. Review of low blood pressures and to monitor for orthostatic hypotension. The patient verbalized feeling better since having some parameters to go by. Education and support given. 02-06-2022: The patient takes his blood pressures on a regular basis and knows to call for abnormal readings.  Advised patient to  discuss blood pressure trends  with provider; Provided education on prescribed diet heart healthy;  Discussed complications of poorly controlled blood pressure such as heart disease, stroke, circulatory complications, vision complications, kidney impairment, sexual dysfunction;   Pain:  (Status: Goal Met.) Long Term Goal  05-08-2022:  Goals met and care plan is being closed. The patient is  having some issues with his right hand but he is having a nerve conduction study on 05-22-2022. The patient states that he knows he has arthritis but he is hopeful they can give him some recommendations on how to help it to feel better. He states if he needs to have surgery he will. Is taking osteo- [employer] and it helps. Denies any acute distress.  Pain assessment performed. Patient states no pain today. He has to monitor for changes in his right knee and right hand. He knows when he has overdone it. 02-06-2022: The patient denies any pain or discomfort today. Will continue to monitor for changes. States that he had been having some back pain and thinks he may have pulled a muscle. States it is much better now. Declined the need to have an appointment with the pcp for follow up. He states he will call for changes or new needs.  Medications reviewed. 02-06-2022: The patient takes Tylenol and ASA when needed for pain relief Reviewed provider established plan for pain management. 02-06-2022: The patient is compliant with plan of care for pain. Uses a hot wax machine periodically for hand pain and discomfort. States when he has flare ups of his hand pain he uses hot wax to help with this. He also has a heating pad and he used that for his back pain but that made it worse. The patient has been trying cold packs and that helps more effectively. Education and support given.  Discussed importance of adherence to all scheduled medical appointments. Next pcp visit on 04-18-2022; Counseled on the importance of reporting any/all new or changed pain symptoms or management strategies to pain management provider; Advised patient to report to care team affect of pain on daily activities; Discussed use of relaxation techniques and/or diversional activities to assist with pain reduction (distraction, imagery, relaxation, massage, acupressure, TENS, heat, and cold  application; Reviewed with patient prescribed pharmacological and nonpharmacological pain relief strategies; Advised patient to discuss unresolved pain, changes in level or intensity of pain with provider;  Patient Goals/Self-Care Activities: Take medications as prescribed   Attend all scheduled provider appointments Call pharmacy for medication refills 3-7 days in advance of running out of medications Attend church or other social activities Perform all self care activities independently  Perform IADL's (shopping, preparing meals, housekeeping, managing finances) independently Call provider office for new concerns or questions  Work with the social worker to address care coordination needs and will continue to work with the clinical team to address health care and disease management related needs call the Suicide and Crisis Lifeline: 988 call the [place] National Suicide Prevention Lifeline: 339-060-3201 or TTY: 365-243-9872 TTY 214-819-6053) to talk to a trained counselor call 1-800-273-TALK (toll free, 24 hour hotline) if experiencing a Mental Health or [place]  check blood pressure daily choose a place to take my blood pressure (home, clinic or office, retail store) write blood pressure results in a log or diary learn about high blood pressure keep a blood pressure log take blood pressure log to all doctor appointments call doctor for signs and symptoms of high blood pressure develop an action plan for high  blood pressure keep all doctor appointments take medications for blood pressure exactly as prescribed begin an exercise program report new symptoms to your doctor eat more whole grains, fruits and vegetables, lean meats and healthy fats - call for medicine refill 2 or 3 days before it runs out - take all medications exactly as prescribed - call doctor with any symptoms you believe are related to your medicine - call doctor when you experience any new  symptoms - go to all doctor appointments as scheduled - adhere to prescribed diet: Heart healthy diet        Plan:No further follow up required: the patient has met the goals of care and care plan is being closed. The patient knows to call for changes or new needs.   Noreene Larsson RN, MSN, [place] Mobile: [phone]

## 2022-05-08 NOTE — Patient Instructions (Signed)
Visit Information  Thank you for taking time to visit with me today. Please don't hesitate to contact me if I can be of assistance to you before our next scheduled telephone appointment.  Following are the goals we discussed today:  Current Barriers: 05-08-2022: Goals met and care plan is being closed. The patient is doing well and denies any new issues except head cold or sinus drainage. He states this has been going on for awhile. He denies any acute distress.  Knowledge Deficits related to plan of care for management of HTN, HLD, Pulmonary Disease, and Chronic pain   Chronic Disease Management support and education needs related to HTN, HLD, Pulmonary Disease, and Chronic pain   RNCM Clinical Goal(s):  Patient will verbalize basic understanding of HTN, HLD, Pulmonary Disease, and Chronic Pain disease process and self health management plan as evidenced by stable blood pressures, labs wnl, taking medications as directed, compliance with plan of care and working with the CCM team to effectively mange health and well being take all medications exactly as prescribed and will call provider for medication related questions as evidenced by compliance and calling for refills before running out    attend all scheduled medical appointments: 04-18-2022 at 0800 am as evidenced by keeping appointments and calling for needed schedule changes. Reminder given today.         demonstrate improved and ongoing adherence to prescribed treatment plan for HTN, HLD, Pulmonary Disease, and Chronic pain  as evidenced by stable conditions, no exacerbations of pain, VS WNL demonstrate a decrease in Osteoarthritis exacerbations  as evidenced by controlled pain in right knee and hand pain  demonstrate ongoing self health care management ability for effective management of chronic conditions as  as evidenced by working with the CCM team through collaboration with Consulting civil engineer, provider, and care team.    Interventions: 1:1  collaboration with primary care provider regarding development and update of comprehensive plan of care as evidenced by provider attestation and co-signature Inter-disciplinary care team collaboration (see longitudinal plan of care) Evaluation of current treatment plan related to  self management and patient's adherence to plan as established by provider     Airway hypersensitivity (Status: Goal Met.) Long Term Goal  05-08-2022: Goals met and care plan is being closed. Is having some sinus/cold congestion with drainage but the drainage is clear. Denies any acute distress.  Reviewed medications with patient, including use of prescribed maintenance and rescue inhalers, and provided instruction on medication management and the importance of adherence. 12-19-2021: The patient uses air purifiers and humidifier to help with his airway hypersensitivity. Especially uses when vacuuming. The patient denies any acute findings today. Will continue to monitor for changes. 02-06-2022: The patient denies any acute findings related to his airway hypersensitivity. He is mindful of weather changes and things that trigger exacerbation. On pretty days he has been walking outside and using  his cane to help with balance.  Provided patient with basic written and verbal airway hypersensitivity  education on self care/management/and exacerbation prevention Advised patient to track and manage hypersensitivity triggers. 02-06-2022: Review and education provided.  Provided written and verbal instructions on pursed lip breathing and utilized returned demonstration as teach back Provided instruction about proper use of medications used for management of airway hypersensitivity  including inhalers. 02-06-2022: The patient has a good understanding of how to effectively manage respiratory health and well being.  Advised patient to self assesses airway hypersensitivity  action plan zone and make appointment with provider  if in the yellow zone  for 48 hours without improvement Advised patient to engage in light exercise as tolerated 3-5 days a week to aid in the the management of Airway hypersensitivity  Provided education about and advised patient to utilize infection prevention strategies to reduce risk of respiratory infection. 02-06-2022: Review of factors that cause increase risk of infections and respiratory illness Discussed the importance of adequate rest and management of fatigue with Airway Hypersensitivity  Screening for signs and symptoms of depression related to chronic disease state  Assessed social determinant of health barriers   Hyperlipidemia:  (Status: Goal Met.) Long Term Goal 05-08-2022: Goals met and care plan is being closed       Lab Results  Component Value Date    CHOL 111 10/14/2021    HDL 37 (L) 10/14/2021    LDLCALC 58 10/14/2021    TRIG 77 10/14/2021    CHOLHDL 3.0 10/14/2021      Medication review performed; medication list updated in electronic medical record.  Provider established cholesterol goals reviewed; Counseled on importance of regular laboratory monitoring as prescribed. 02-06-2022: Review of lab work and having on a regular basis.  Provided HLD educational materials; Reviewed role and benefits of statin for ASCVD risk reduction; Discussed strategies to manage statin-induced myalgias; Reviewed importance of limiting foods high in cholesterol. 02-06-2022: The patient monitors his dietary intake and denies any acute findings with dietary restrictions today;   Hypertension: (Status: Goal Met.) 05-08-2022: Goals met and care plan is being closed  Last practice recorded BP readings:     BP Readings from Last 3 Encounters:  04/18/22 125/89  02/20/22 (!) 124/56  02/06/22 135/83  Most recent eGFR/CrCl:       Lab Results  Component Value Date    EGFR 91 10/14/2021    No components found for: CRCL   Evaluation of current treatment plan related to hypertension self management and patient's  adherence to plan as established by provider. 02-06-2022: The patient has good control of his HTN. Denies any issues with HTN or heart health. Will continue to monitor. Takes his blood pressure readings on a consistent basis. The patient states his blood pressures and heart rate have been stable. Today was 135/83 with a pulse of 70. Provided education to patient re: stroke prevention, s/s of heart attack and stroke; Reviewed prescribed diet heart healthy. 02-06-2022: The patient is compliant with a heart healthy diet  Reviewed medications with patient and discussed importance of compliance. 02-06-2022: The patient is complaint with medications;  Discussed plans with patient for ongoing care management follow up and provided patient with direct contact information for care management team; Advised patient, providing education and rationale, to monitor blood pressure daily and record, calling PCP for findings outside established parameters. 10-10-2021: The patient was worried about some of his blood pressure readings. The patients reading this am was 112/68. The patient states that he doesn't like it when top number is 140's. Education on goal of systolic of <726 and diastolic <20. Review of low blood pressures and to monitor for orthostatic hypotension. The patient verbalized feeling better since having some parameters to go by. Education and support given. 02-06-2022: The patient takes his blood pressures on a regular basis and knows to call for abnormal readings.  Advised patient to discuss blood pressure trends  with provider; Provided education on prescribed diet heart healthy;  Discussed complications of poorly controlled blood pressure such as heart disease, stroke, circulatory complications, vision complications, kidney impairment, sexual  dysfunction;    Pain:  (Status: Goal Met.) Long Term Goal  05-08-2022: Goals met and care plan is being closed. The patient is  having some issues with his right hand  but he is having a nerve conduction study on 05-22-2022. The patient states that he knows he has arthritis but he is hopeful they can give him some recommendations on how to help it to feel better. He states if he needs to have surgery he will. Is taking osteo- [employer] and it helps. Denies any acute distress.  Pain assessment performed. Patient states no pain today. He has to monitor for changes in his right knee and right hand. He knows when he has overdone it. 02-06-2022: The patient denies any pain or discomfort today. Will continue to monitor for changes. States that he had been having some back pain and thinks he may have pulled a muscle. States it is much better now. Declined the need to have an appointment with the pcp for follow up. He states he will call for changes or new needs.  Medications reviewed. 02-06-2022: The patient takes Tylenol and ASA when needed for pain relief Reviewed provider established plan for pain management. 02-06-2022: The patient is compliant with plan of care for pain. Uses a hot wax machine periodically for hand pain and discomfort. States when he has flare ups of his hand pain he uses hot wax to help with this. He also has a heating pad and he used that for his back pain but that made it worse. The patient has been trying cold packs and that helps more effectively. Education and support given.  Discussed importance of adherence to all scheduled medical appointments. Next pcp visit on 04-18-2022; Counseled on the importance of reporting any/all new or changed pain symptoms or management strategies to pain management provider; Advised patient to report to care team affect of pain on daily activities; Discussed use of relaxation techniques and/or diversional activities to assist with pain reduction (distraction, imagery, relaxation, massage, acupressure, TENS, heat, and cold application; Reviewed with patient prescribed pharmacological and nonpharmacological pain relief  strategies; Advised patient to discuss unresolved pain, changes in level or intensity of pain with provider;    No further follow up needed at this time. The patient knows to call for changes or new concerns  Please call the care guide team at [phone] if you need to cancel or reschedule your appointment.   If you are experiencing a Mental Health or [place] or need someone to talk to, please call the Suicide and Crisis Lifeline: 988 call the [place] National Suicide Prevention Lifeline: 380-300-8549 or TTY: 715-691-5216 TTY 340-739-8389) to talk to a trained counselor call 1-800-273-TALK (toll free, 24 hour hotline)   Patient verbalizes understanding of instructions and care plan provided today and agrees to view in [place]. Active MyChart status and patient understanding of how to access instructions and care plan via MyChart confirmed with patient.      Noreene Larsson RN, MSN, [place] Mobile: [phone]

## 2022-05-22 DIAGNOSIS — G5601 Carpal tunnel syndrome, right upper limb: Secondary | ICD-10-CM | POA: Diagnosis not present

## 2022-05-23 DIAGNOSIS — G5601 Carpal tunnel syndrome, right upper limb: Secondary | ICD-10-CM | POA: Diagnosis not present

## 2022-05-26 DIAGNOSIS — J45909 Unspecified asthma, uncomplicated: Secondary | ICD-10-CM | POA: Diagnosis not present

## 2022-05-26 DIAGNOSIS — I10 Essential (primary) hypertension: Secondary | ICD-10-CM | POA: Diagnosis not present

## 2022-05-26 DIAGNOSIS — E782 Mixed hyperlipidemia: Secondary | ICD-10-CM | POA: Diagnosis not present

## 2022-05-28 ENCOUNTER — Other Ambulatory Visit: Payer: Self-pay | Admitting: Orthopedic Surgery

## 2022-05-28 ENCOUNTER — Other Ambulatory Visit: Payer: Self-pay

## 2022-05-28 ENCOUNTER — Encounter
Admission: RE | Admit: 2022-05-28 | Discharge: 2022-05-28 | Disposition: A | Discharge status: Home / Self Care | Payer: Medicare Other | Source: Ambulatory Visit | Attending: Orthopedic Surgery | Admitting: Orthopedic Surgery

## 2022-05-28 DIAGNOSIS — R001 Bradycardia, unspecified: Secondary | ICD-10-CM

## 2022-05-28 HISTORY — DX: Cardiac arrhythmia, unspecified: I49.9

## 2022-05-28 NOTE — Patient Instructions (Addendum)
Your procedure is scheduled on: 06/05/2022  Report to the Registration Desk on the 1st floor of the [facility]. To find out your arrival time, please call [phone] between 1PM - 3PM on: 06/04/2022  If your arrival time is 6:00 am, do not arrive prior to that time as the [facility] entrance doors do not open until 6:00 am.  REMEMBER: Instructions that are not followed completely may result in serious medical risk, up to and including death; or upon the discretion of your surgeon and anesthesiologist your surgery may need to be rescheduled.  Do not eat food after midnight the night before surgery.  No gum chewing, lozengers or hard candies.   TAKE THESE MEDICATIONS THE MORNING OF SURGERY WITH A SIP OF WATER: omeprazole  Tamsulosin 3.   finasteride    Follow recommendations from Cardiologist, Pulmonologist or PCP regarding stopping Aspirin.   One week prior to surgery: Stop Anti-inflammatories (NSAIDS) such as Advil, Aleve, Ibuprofen, Motrin, Naproxen, Naprosyn and Aspirin based products such as Excedrin, Goodys Powder, BC Powder. Stop ANY OVER THE COUNTER supplements until after surgery like Vitamin D, calcium carb,glucosamine, MULTIVITAMIN, and metamucil  You may however, continue to take Tylenol if needed for pain up until the day of surgery.  No Alcohol for 24 hours before or after surgery.  No Smoking including e-cigarettes for 24 hours prior to surgery.  No chewable tobacco products for at least 6 hours prior to surgery.  No nicotine patches on the day of surgery.  Do not use any "recreational" drugs for at least a week prior to your surgery.  Please be advised that the combination of cocaine and anesthesia may have negative outcomes, up to and including death. If you test positive for cocaine, your surgery will be cancelled.  On the morning of surgery brush your teeth with toothpaste and water, you may rinse your mouth with mouthwash if you wish. Do not swallow any  toothpaste or mouthwash.  Use CHG Soap  as directed on instruction sheet.  Do not wear jewelry, make-up, hairpins, clips or nail polish.  Do not wear lotions, powders, or perfumes.   Do not shave body from the neck down 48 hours prior to surgery just in case you cut yourself which could leave a site for infection.  Also, freshly shaved skin may become irritated if using the CHG soap.  Contact lenses, hearing aids and dentures may not be worn into surgery.  Do not bring valuables to the hospital. [facility] is not responsible for any missing/lost belongings or valuables.   Notify your doctor if there is any change in your medical condition (cold, fever, infection).  Wear comfortable clothing (specific to your surgery type) to the hospital.  After surgery, you can help prevent lung complications by doing breathing exercises.  Take deep breaths and cough every 1-2 hours. Your doctor may order a device called an Incentive Spirometer to help you take deep breaths.   If you are being admitted to the hospital overnight, leave your suitcase in the car. After surgery it may be brought to your room.  If you are being discharged the day of surgery, you will not be allowed to drive home. You will need a responsible adult (18 years or older) to drive you home and stay with you that night.   If you are taking public transportation, you will need to have a responsible adult (18 years or older) with you. Please confirm with your physician that it is acceptable  to use public transportation.   Please call the [facility] Dept. at [phone] if you have any questions about these instructions.  Surgery Visitation Policy:  Patients undergoing a surgery or procedure may have two family members or support persons with them as long as the person is not COVID-19 positive or experiencing its symptoms.   Inpatient Visitation:    Visiting hours are 7 a.m. to 8 p.m. Up to four visitors  are allowed at one time in a patient room, including children. The visitors may rotate out with other people during the day. One designated support person (adult) may remain overnight.

## 2022-05-30 ENCOUNTER — Encounter: Payer: Self-pay | Admitting: Urgent Care

## 2022-05-30 ENCOUNTER — Encounter
Admission: RE | Admit: 2022-05-30 | Discharge: 2022-05-30 | Disposition: A | Discharge status: Home / Self Care | Payer: Medicare Other | Source: Ambulatory Visit | Attending: Orthopedic Surgery | Admitting: Orthopedic Surgery

## 2022-05-30 DIAGNOSIS — Z01818 Encounter for other preprocedural examination: Secondary | ICD-10-CM | POA: Diagnosis not present

## 2022-05-30 DIAGNOSIS — R001 Bradycardia, unspecified: Secondary | ICD-10-CM | POA: Diagnosis not present

## 2022-05-30 LAB — BASIC METABOLIC PANEL
Anion gap: 6 (ref 5–15)
BUN: 15 mg/dL (ref 8–23)
CO2: 29 mmol/L (ref 22–32)
Calcium: 8.6 mg/dL — ABNORMAL LOW (ref 8.9–10.3)
Chloride: 106 mmol/L (ref 98–111)
Creatinine, Ser: 0.65 mg/dL (ref 0.61–1.24)
GFR, Estimated: >60 mL/min (ref >60)
Glucose, Bld: 93 mg/dL (ref 70–99)
Potassium: 3.6 mmol/L (ref 3.5–5.1)
Sodium: 141 mmol/L (ref 135–145)

## 2022-06-05 ENCOUNTER — Encounter
Admission: RE | Disposition: A | Discharge status: Home / Self Care | Payer: Self-pay | Source: Home / Self Care | Attending: Orthopedic Surgery

## 2022-06-05 ENCOUNTER — Ambulatory Visit
Admission: RE | Admit: 2022-06-05 | Discharge: 2022-06-05 | Disposition: A | Discharge status: Home / Self Care | Payer: Medicare Other | Attending: Orthopedic Surgery | Admitting: Orthopedic Surgery

## 2022-06-05 ENCOUNTER — Other Ambulatory Visit: Payer: Self-pay

## 2022-06-05 ENCOUNTER — Encounter: Payer: Self-pay | Admitting: Orthopedic Surgery

## 2022-06-05 ENCOUNTER — Ambulatory Visit: Payer: Medicare Other | Admitting: Anesthesiology

## 2022-06-05 DIAGNOSIS — I10 Essential (primary) hypertension: Secondary | ICD-10-CM | POA: Diagnosis not present

## 2022-06-05 DIAGNOSIS — E785 Hyperlipidemia, unspecified: Secondary | ICD-10-CM | POA: Diagnosis not present

## 2022-06-05 DIAGNOSIS — G5601 Carpal tunnel syndrome, right upper limb: Secondary | ICD-10-CM | POA: Insufficient documentation

## 2022-06-05 DIAGNOSIS — I2510 Atherosclerotic heart disease of native coronary artery without angina pectoris: Secondary | ICD-10-CM | POA: Diagnosis not present

## 2022-06-05 HISTORY — PX: CARPAL TUNNEL RELEASE: SHX101

## 2022-06-05 SURGERY — CARPAL TUNNEL RELEASE
Anesthesia: General | Site: Hand | Laterality: Right

## 2022-06-05 MED ORDER — EPHEDRINE SULFATE (PRESSORS) 50 MG/ML IJ SOLN
INTRAMUSCULAR | Status: DC | PRN
  Administered 2022-06-05: 5 mg via INTRAVENOUS

## 2022-06-05 MED ORDER — OXYCODONE HCL 5 MG/5ML PO SOLN: 5.0000 mg | Freq: Once | ORAL | Status: AC | PRN

## 2022-06-05 MED ORDER — CHLORHEXIDINE GLUCONATE 0.12 % MT SOLN
OROMUCOSAL | Status: AC
  Administered 2022-06-05: 15 mL via OROMUCOSAL
  Filled 2022-06-05: qty 15

## 2022-06-05 MED ORDER — LIDOCAINE HCL (CARDIAC) PF 100 MG/5ML IV SOSY
PREFILLED_SYRINGE | INTRAVENOUS | Status: DC | PRN
  Administered 2022-06-05: 100 mg via INTRAVENOUS

## 2022-06-05 MED ORDER — CEFAZOLIN SODIUM-DEXTROSE 2-4 GM/100ML-% IV SOLN
2.0000 g | INTRAVENOUS | Status: AC
  Administered 2022-06-05: 2 g via INTRAVENOUS

## 2022-06-05 MED ORDER — OXYCODONE HCL 5 MG PO TABS: 5.0000 mg | ORAL_TABLET | ORAL | 0 refills | Status: DC | PRN

## 2022-06-05 MED ORDER — GLYCOPYRROLATE 0.2 MG/ML IJ SOLN
INTRAMUSCULAR | Status: AC
  Filled 2022-06-05: qty 1

## 2022-06-05 MED ORDER — FENTANYL CITRATE (PF) 100 MCG/2ML IJ SOLN
INTRAMUSCULAR | Status: AC
  Administered 2022-06-05: 25 ug via INTRAVENOUS
  Filled 2022-06-05: qty 2

## 2022-06-05 MED ORDER — FAMOTIDINE 20 MG PO TABS
ORAL_TABLET | ORAL | Status: AC
  Filled 2022-06-05: qty 1

## 2022-06-05 MED ORDER — OXYCODONE HCL 5 MG PO TABS
5.0000 mg | ORAL_TABLET | Freq: Once | ORAL | Status: AC | PRN
  Administered 2022-06-05: 5 mg via ORAL

## 2022-06-05 MED ORDER — DEXAMETHASONE SODIUM PHOSPHATE 10 MG/ML IJ SOLN
INTRAMUSCULAR | Status: DC | PRN
  Administered 2022-06-05: 5 mg via INTRAVENOUS

## 2022-06-05 MED ORDER — DEXAMETHASONE SODIUM PHOSPHATE 10 MG/ML IJ SOLN
INTRAMUSCULAR | Status: AC
  Filled 2022-06-05: qty 1

## 2022-06-05 MED ORDER — EPHEDRINE 5 MG/ML INJ
INTRAVENOUS | Status: AC
  Filled 2022-06-05: qty 5

## 2022-06-05 MED ORDER — LIDOCAINE HCL (PF) 2 % IJ SOLN
INTRAMUSCULAR | Status: AC
  Filled 2022-06-05: qty 5

## 2022-06-05 MED ORDER — PROPOFOL 1000 MG/100ML IV EMUL
INTRAVENOUS | Status: AC
  Filled 2022-06-05: qty 100

## 2022-06-05 MED ORDER — 0.9 % SODIUM CHLORIDE (POUR BTL) OPTIME
TOPICAL | Status: DC | PRN
  Administered 2022-06-05: 500 mL

## 2022-06-05 MED ORDER — ACETAMINOPHEN 500 MG PO TABS: 1000.0000 mg | ORAL_TABLET | ORAL | Status: AC

## 2022-06-05 MED ORDER — BUPIVACAINE HCL (PF) 0.5 % IJ SOLN
INTRAMUSCULAR | Status: AC
  Filled 2022-06-05: qty 30

## 2022-06-05 MED ORDER — GLYCOPYRROLATE 0.2 MG/ML IJ SOLN
INTRAMUSCULAR | Status: DC | PRN
  Administered 2022-06-05: .2 mg via INTRAVENOUS

## 2022-06-05 MED ORDER — ONDANSETRON HCL 4 MG/2ML IJ SOLN
INTRAMUSCULAR | Status: DC | PRN
  Administered 2022-06-05: 4 mg via INTRAVENOUS

## 2022-06-05 MED ORDER — PROPOFOL 10 MG/ML IV BOLUS
INTRAVENOUS | Status: AC
  Filled 2022-06-05: qty 20

## 2022-06-05 MED ORDER — OXYCODONE HCL 5 MG PO TABS
ORAL_TABLET | ORAL | Status: AC
  Filled 2022-06-05: qty 1

## 2022-06-05 MED ORDER — FENTANYL CITRATE (PF) 100 MCG/2ML IJ SOLN
25.0000 ug | INTRAMUSCULAR | Status: DC | PRN
  Administered 2022-06-05: 25 ug via INTRAVENOUS
  Administered 2022-06-05: 50 ug via INTRAVENOUS

## 2022-06-05 MED ORDER — FENTANYL CITRATE (PF) 100 MCG/2ML IJ SOLN
INTRAMUSCULAR | Status: AC
  Filled 2022-06-05: qty 2

## 2022-06-05 MED ORDER — LACTATED RINGERS IV SOLN: INTRAVENOUS | Status: DC

## 2022-06-05 MED ORDER — ORAL CARE MOUTH RINSE: 15.0000 mL | Freq: Once | OROMUCOSAL | Status: AC

## 2022-06-05 MED ORDER — FENTANYL CITRATE (PF) 100 MCG/2ML IJ SOLN
INTRAMUSCULAR | Status: DC | PRN
  Administered 2022-06-05: 25 ug via INTRAVENOUS
  Administered 2022-06-05: 50 ug via INTRAVENOUS
  Administered 2022-06-05: 25 ug via INTRAVENOUS

## 2022-06-05 MED ORDER — PROPOFOL 10 MG/ML IV BOLUS
INTRAVENOUS | Status: DC | PRN
  Administered 2022-06-05: 20 mg via INTRAVENOUS
  Administered 2022-06-05: 130 mg via INTRAVENOUS

## 2022-06-05 MED ORDER — ONDANSETRON HCL 4 MG PO TABS: 4.0000 mg | ORAL_TABLET | Freq: Three times a day (TID) | ORAL | 0 refills | Status: DC | PRN

## 2022-06-05 MED ORDER — ACETAMINOPHEN 500 MG PO TABS
ORAL_TABLET | ORAL | Status: AC
  Administered 2022-06-05: 1000 mg via ORAL
  Filled 2022-06-05: qty 2

## 2022-06-05 MED ORDER — ONDANSETRON HCL 4 MG/2ML IJ SOLN
INTRAMUSCULAR | Status: AC
  Filled 2022-06-05: qty 2

## 2022-06-05 MED ORDER — CHLORHEXIDINE GLUCONATE CLOTH 2 % EX PADS
6.0000 | MEDICATED_PAD | Freq: Once | CUTANEOUS | Status: AC
  Administered 2022-06-05: 6 via TOPICAL

## 2022-06-05 MED ORDER — CEFAZOLIN SODIUM-DEXTROSE 2-4 GM/100ML-% IV SOLN
INTRAVENOUS | Status: AC
  Filled 2022-06-05: qty 100

## 2022-06-05 MED ORDER — CHLORHEXIDINE GLUCONATE 0.12 % MT SOLN: 15.0000 mL | Freq: Once | OROMUCOSAL | Status: AC

## 2022-06-05 MED ORDER — CHLORHEXIDINE GLUCONATE CLOTH 2 % EX PADS: 6.0000 | MEDICATED_PAD | Freq: Once | CUTANEOUS | Status: DC

## 2022-06-05 MED ORDER — FAMOTIDINE 20 MG PO TABS
20.0000 mg | ORAL_TABLET | Freq: Once | ORAL | Status: AC
  Administered 2022-06-05: 20 mg via ORAL

## 2022-06-05 SURGICAL SUPPLY — 47 items
BLADE SURG MINI STRL (BLADE) ×2 IMPLANT
BNDG ELASTIC 4X5.8 VLCR NS LF (GAUZE/BANDAGES/DRESSINGS) ×4 IMPLANT
BNDG ESMARK 4X12 TAN STRL LF (GAUZE/BANDAGES/DRESSINGS) ×2 IMPLANT
CORD BIP STRL DISP 12FT (MISCELLANEOUS) ×2 IMPLANT
CUFF TOURN SGL QUICK 18X4 (TOURNIQUET CUFF) IMPLANT
DRAPE ORTHO SPLIT 77X108 STRL (DRAPES) ×1
DRAPE SURG 17X11 SM STRL (DRAPES) ×2 IMPLANT
DRAPE SURG ORHT 6 SPLT 77X108 (DRAPES) ×1 IMPLANT
DRSG GAUZE FLUFF 36X18 (GAUZE/BANDAGES/DRESSINGS) ×2 IMPLANT
DURAPREP 26ML APPLICATOR (WOUND CARE) ×4 IMPLANT
ELECT REM PT RETURN 9FT ADLT (ELECTROSURGICAL) ×2
ELECTRODE REM PT RTRN 9FT ADLT (ELECTROSURGICAL) ×1 IMPLANT
FORCEPS JEWEL BIP 4-3/4 STR (INSTRUMENTS) ×2 IMPLANT
GAUZE SPONGE 4X4 12PLY STRL (GAUZE/BANDAGES/DRESSINGS) ×2 IMPLANT
GAUZE XEROFORM 1X8 LF (GAUZE/BANDAGES/DRESSINGS) ×2 IMPLANT
GLOVE BIOGEL PI IND STRL 9 (GLOVE) ×1 IMPLANT
GLOVE BIOGEL PI INDICATOR 9 (GLOVE) ×1
GLOVE BIOGEL PI ORTHO SZ9 (GLOVE) ×8 IMPLANT
GOWN STRL REUS TWL 2XL XL LVL4 (GOWN DISPOSABLE) ×2 IMPLANT
GOWN STRL REUS W/ TWL LRG LVL3 (GOWN DISPOSABLE) ×1 IMPLANT
GOWN STRL REUS W/TWL LRG LVL3 (GOWN DISPOSABLE) ×1
KIT TURNOVER KIT A (KITS) ×2 IMPLANT
MANIFOLD NEPTUNE II (INSTRUMENTS) ×2 IMPLANT
NDL FILTER BLUNT 18X1 1/2 (NEEDLE) ×1 IMPLANT
NEEDLE FILTER BLUNT 18X 1/2SAF (NEEDLE) ×1
NEEDLE FILTER BLUNT 18X1 1/2 (NEEDLE) ×1 IMPLANT
NS IRRIG 500ML POUR BTL (IV SOLUTION) ×2 IMPLANT
PACK EXTREMITY ARMC (MISCELLANEOUS) ×2 IMPLANT
PAD CAST CTTN 4X4 STRL (SOFTGOODS) ×2 IMPLANT
PADDING CAST 4IN STRL (MISCELLANEOUS) ×3
PADDING CAST BLEND 4X4 STRL (MISCELLANEOUS) ×3 IMPLANT
PADDING CAST COTTON 4X4 STRL (SOFTGOODS) ×2
SLING ARM LRG DEEP (SOFTGOODS) ×2 IMPLANT
SLING ARM M TX990204 (SOFTGOODS) ×2 IMPLANT
SPLINT CAST 1 STEP 3X12 (MISCELLANEOUS) ×2 IMPLANT
STOCKINETTE 48X4 2 PLY STRL (GAUZE/BANDAGES/DRESSINGS) ×1 IMPLANT
STOCKINETTE STRL 4IN 9604848 (GAUZE/BANDAGES/DRESSINGS) ×2 IMPLANT
STRIP CLOSURE SKIN 1/2X4 (GAUZE/BANDAGES/DRESSINGS) ×2 IMPLANT
SUT ETHILON 3-0 FS-10 30 BLK (SUTURE) ×2
SUT ETHILON 4-0 (SUTURE) ×1
SUT ETHILON 4-0 FS2 18XMFL BLK (SUTURE) ×1
SUT ETHILON 5-0 FS-2 18 BLK (SUTURE) ×2 IMPLANT
SUTURE EHLN 3-0 FS-10 30 BLK (SUTURE) ×1 IMPLANT
SUTURE ETHLN 4-0 FS2 18XMF BLK (SUTURE) ×1 IMPLANT
SYR 3ML LL SCALE MARK (SYRINGE) ×2 IMPLANT
TRAP FLUID SMOKE EVACUATOR (MISCELLANEOUS) ×2 IMPLANT
WATER STERILE IRR 500ML POUR (IV SOLUTION) ×2 IMPLANT

## 2022-06-05 NOTE — Discharge Instructions (Signed)
AMBULATORY SURGERY  ?DISCHARGE INSTRUCTIONS ? ? ?The drugs that you were given will stay in your system until tomorrow so for the next 24 hours you should not: ? ?Drive an automobile ?Make any legal decisions ?Drink any alcoholic beverage ? ? ?You may resume regular meals tomorrow.  Today it is better to start with liquids and gradually work up to solid foods. ? ?You may eat anything you prefer, but it is better to start with liquids, then soup and crackers, and gradually work up to solid foods. ? ? ?Please notify your doctor immediately if you have any unusual bleeding, trouble breathing, redness and pain at the surgery site, drainage, fever, or pain not relieved by medication. ? ? ? ?Additional Instructions: ? ? ? ?Please contact your physician with any problems or Same Day Surgery at [phone], Monday through Friday 6 am to 4 pm, or [place] [facility] [place] Main number at [phone].  ?

## 2022-06-05 NOTE — Anesthesia Preprocedure Evaluation (Signed)
Anesthesia Evaluation  Patient identified by MRN, date of birth, ID band Patient awake    Reviewed: Allergy & Precautions, NPO status , Patient's Chart, lab work & pertinent test results  History of Anesthesia Complications Negative for: history of anesthetic complications  Airway Mallampati: II  TM Distance: >3 FB Neck ROM: full    Dental  (+) Missing, Upper Dentures, Lower Dentures   Pulmonary neg pulmonary ROS, neg shortness of breath, neg recent URI,    Pulmonary exam normal        Cardiovascular hypertension, (-) angina+ CAD  (-) Cardiac Stents and (-) CABG Normal cardiovascular exam+ Valvular Problems/Murmurs      Neuro/Psych negative neurological ROS  negative psych ROS   GI/Hepatic negative GI ROS, Neg liver ROS,   Endo/Other  negative endocrine ROS  Renal/GU      Musculoskeletal   Abdominal   Peds  Hematology negative hematology ROS (+)   Anesthesia Other Findings Past Medical History: No date: Allergy 05/30/2015: Arthritis     Comment:  Gout - Left knee and ankle 05/30/2015: Cervical pain No date: GERD (gastroesophageal reflux disease) No date: Hyperlipidemia No date: Hypertension No date: Irregular heart rhythm 05/30/2015: Lipoma of neck No date: Wears dentures     Comment:  full upper and lower (doesn't wear lower) No date: Wears hearing aid     Comment:  bilateral  Past Surgical History: 06/11/2016: CATARACT EXTRACTION W/PHACO; Right     Comment:  Procedure: CATARACT EXTRACTION PHACO AND INTRAOCULAR               LENS PLACEMENT ([place]);  Surgeon: Leandrew Koyanagi, MD;               Location: [facility];  Service: Ophthalmology;                Laterality: Right; 07/09/2016: CATARACT EXTRACTION W/PHACO; Left     Comment:  Procedure: CATARACT EXTRACTION PHACO AND INTRAOCULAR               LENS PLACEMENT ([facility]);  Surgeon: Leandrew Koyanagi, MD;               Location: [facility];  Service: Ophthalmology;                Laterality: Left;  MALYUGIN 06/20/2015: ESOPHAGOGASTRODUODENOSCOPY (EGD) WITH PROPOFOL; N/A     Comment:  Procedure: ESOPHAGOGASTRODUODENOSCOPY (EGD) WITH               PROPOFOL with dialation;  Surgeon: Lucilla Lame, MD;                Location: [facility];  Service: Endoscopy;                Laterality: N/A; 06/22/2015: FLEXIBLE BRONCHOSCOPY; N/A     Comment:  Procedure: FLEXIBLE BRONCHOSCOPY;  Surgeon: Allyne Gee, MD;  Location: [facility];  Service: Pulmonary;                Laterality: N/A; 2013: GASTRIC OUTLET OBSTRUCTION RELEASE 10/2013: gastric reconstructive No date: UPPER GI ENDOSCOPY  BMI    Body Mass Index: 22.24 kg/m      Reproductive/Obstetrics negative OB ROS                             Anesthesia Physical Anesthesia Plan  ASA: 3  Anesthesia  Plan:    Post-op Pain Management:    Induction: Intravenous  PONV Risk Score and Plan: 2 and Ondansetron and Dexamethasone  Airway Management Planned: LMA  Additional Equipment:   Intra-op Plan:   Post-operative Plan: Extubation in OR  Informed Consent: I have reviewed the patients History and Physical, chart, labs and discussed the procedure including the risks, benefits and alternatives for the proposed anesthesia with the patient or authorized representative who has indicated his/her understanding and acceptance.     Dental Advisory Given  Plan Discussed with: Anesthesiologist, CRNA and Surgeon  Anesthesia Plan Comments: (Patient consented for risks of anesthesia including but not limited to:  - adverse reactions to medications - damage to eyes, teeth, lips or other oral mucosa - nerve damage due to positioning  - sore throat or hoarseness - Damage to heart, brain, nerves, lungs, other parts of body or loss of life  Patient voiced understanding.)        Anesthesia Quick Evaluation

## 2022-06-05 NOTE — H&P (Addendum)
PREOPERATIVE H&P  Chief Complaint: Right Carpal Tunnel Syndrome  HPI: Zachary Brewer is a 81 y.o. male who presents for preoperative history and physical with a diagnosis of Right Carpal Tunnel Syndrome. Symptoms of paresthesias and significant hand and forearm pain are significantly impairing activities of daily living.  He has failed nonoperative management and wished to proceed with open carpal tunnel release.    Past Medical History:  Diagnosis Date   Allergy    Arthritis 05/30/2015   Gout - Left knee and ankle   Cervical pain 05/30/2015   GERD (gastroesophageal reflux disease)    Hyperlipidemia    Hypertension    Irregular heart rhythm    Lipoma of neck 05/30/2015   Wears dentures    full upper and lower (doesn't wear lower)   Wears hearing aid    bilateral   Past Surgical History:  Procedure Laterality Date   CATARACT EXTRACTION W/PHACO Right 06/11/2016   Procedure: CATARACT EXTRACTION PHACO AND INTRAOCULAR LENS PLACEMENT ([place]);  Surgeon: Leandrew Koyanagi, MD;  Location: [place];  Service: Ophthalmology;  Laterality: Right;   CATARACT EXTRACTION W/PHACO Left 07/09/2016   Procedure: CATARACT EXTRACTION PHACO AND INTRAOCULAR LENS PLACEMENT ([facility]);  Surgeon: Leandrew Koyanagi, MD;  Location: [place];  Service: Ophthalmology;  Laterality: Left;  MALYUGIN   ESOPHAGOGASTRODUODENOSCOPY (EGD) WITH PROPOFOL N/A 06/20/2015   Procedure: ESOPHAGOGASTRODUODENOSCOPY (EGD) WITH PROPOFOL with dialation;  Surgeon: Lucilla Lame, MD;  Location: [place];  Service: Endoscopy;  Laterality: N/A;   FLEXIBLE BRONCHOSCOPY N/A 06/22/2015   Procedure: FLEXIBLE BRONCHOSCOPY;  Surgeon: Allyne Gee, MD;  Location: [facility];  Service: Pulmonary;  Laterality: N/A;   GASTRIC OUTLET OBSTRUCTION RELEASE  2013   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   UPPER GI ENDOSCOPY     Social History   Socioeconomic History   Marital status: Widowed    Spouse name: Not on file   Number  of children: 3   Years of education: Not on file   Highest education level: 6th grade  Occupational History   Occupation: retired  Tobacco Use   Smoking status: Never   Smokeless tobacco: Never  Vaping Use   Vaping Use: Never used  Substance and Sexual Activity   Alcohol use: No   Drug use: No   Sexual activity: Not on file  Other Topics Concern   Not on file  Social History Narrative   Not on file   Social Determinants of Health   Financial Resource Strain: Low Risk  (02/20/2022)   Overall Financial Resource Strain (CARDIA)    Difficulty of Paying Living Expenses: Not hard at all  Food Insecurity: No Food Insecurity (02/20/2022)   Hunger Vital Sign    Worried About Running Out of Food in the Last Year: Never true    [place] in the Last Year: Never true  Transportation Needs: No Transportation Needs (02/20/2022)   PRAPARE - Hydrologist (Medical): No    Lack of Transportation (Non-Medical): No  Physical Activity: Insufficiently Active (02/20/2022)   Exercise Vital Sign    Days of Exercise per Week: 2 days    Minutes of Exercise per Session: 20 min  Stress: No Stress Concern Present (02/20/2022)   [place]    Feeling of Stress : Not at all  Social Connections: Moderately Isolated (06/20/2021)   Social Connection and Isolation Panel [NHANES]    Frequency of Communication with Friends  and Family: More than three times a week    Frequency of Social Gatherings with Friends and Family: More than three times a week    Attends Religious Services: More than 4 times per year    Active Member of Clubs or Organizations: No    Attends Archivist Meetings: Never    Marital Status: Widowed   Family History  Problem Relation Age of Onset   Diabetes Mother    Alzheimer's disease Mother    Heart disease Father    Diabetes Sister    Diabetes Brother    Stroke Brother     Mental illness Brother    Hypertension Paternal Grandfather    No Known Allergies Prior to Admission medications   Medication Sig Start Date End Date Taking? Authorizing Provider  enalapril (VASOTEC) 20 MG tablet Take 1 tablet (20 mg total) by mouth 2 (two) times daily. 12/25/21  Yes Karamalegos, Devonne Doughty, DO  finasteride (PROSCAR) 5 MG tablet Take 1 tablet (5 mg total) by mouth daily. 12/25/21  Yes Karamalegos, Alexander J, DO  montelukast (SINGULAIR) 10 MG tablet Take 1 tablet (10 mg total) by mouth at bedtime. 12/25/21  Yes Karamalegos, Devonne Doughty, DO  omeprazole (PRILOSEC) 20 MG capsule Take 1 capsule (20 mg total) by mouth 2 (two) times daily before a meal. 12/25/21  Yes Karamalegos, Devonne Doughty, DO  tamsulosin (FLOMAX) 0.4 MG CAPS capsule Take 2 capsules (0.8 mg total) by mouth daily after breakfast. 12/25/21  Yes Karamalegos, Devonne Doughty, DO  Accu-Chek FastClix Lancets MISC Use to check blood sugar up to 1 x weekly 10/08/20   [name], DO  aspirin EC 81 MG tablet Take 81 mg by mouth daily.    [provider]  Blood Glucose Monitoring Suppl (ACCU-CHEK AVIVA PLUS) w/Device KIT Use glucometer to check blood sugar 1 x weekly as advised 04/19/18   Karamalegos, Devonne Doughty, DO  Cholecalciferol (VITAMIN D3) 1000 units CAPS Take 1,000 Units by mouth daily.    [provider]  Glucosamine-Chondroit-Vit C-Mn (GLUCOSAMINE 1500 COMPLEX PO) Take 1,500 mg by mouth daily.    [provider]  glucose blood (ACCU-CHEK AVIVA PLUS) test strip Use to check once per day 12/25/21   [name], DO  Multiple Vitamin (MULTIVITAMIN) tablet Take 1 tablet by mouth daily. Reported on 11/21/2015    [provider]  psyllium (METAMUCIL) 58.6 % powder Take 1 packet by mouth daily.    [provider]     Positive ROS: All other systems have been reviewed and were otherwise negative with the exception of those mentioned in the HPI and as above.  Physical  Exam: General: Alert, no acute distress Cardiovascular: Regular rate and rhythm, no murmurs rubs or gallops.  No pedal edema Respiratory: Clear to auscultation bilaterally, no wheezes rales or rhonchi. No cyanosis, no use of accessory musculature GI: No organomegaly, abdomen is soft and non-tender nondistended with positive bowel sounds. Skin: Skin intact, no lesions within the operative field. Neurologic: Sensation intact distally Psychiatric: Patient is competent for consent with normal mood and affect Lymphatic: No cervical lymphadenopathy  MUSCULOSKELETAL: Right hand: adduction contracture of right thumb.  Skin intact.  Sensation intact to light touch in all 5 fingers but diminished in the thumb, index and middle fingers.  Fingers are well perfused.  Full digital ROM except for the right thumb which is limited from contracture.  Moderate thenar atrophy.  Assessment: Right Carpal Tunnel Syndrome  Plan: Plan for Procedure(s): OPEN  RIGHT CARPAL TUNNEL RELEASE  I reviewed the details of the operation as well as the postoperative course with the patient and his daughter.  A preoperative physical was performed.  I marked the right hand according to hospital prescription sent to surgery protocol.  I discussed the risks and benefits of surgery. The risks include but are not limited to infection, bleeding, nerve or blood vessel injury, joint stiffness or loss of motion, persistent pain, weakness or instability, recurrent carpal tunnel syndrome and the need for further surgery. Medical risks include but are not limited to DVT and pulmonary embolism, myocardial infarction, stroke, pneumonia, respiratory failure and death. Patient understood these risks and wished to proceed.     Thornton Park, MD   06/05/2022 7:52 AM

## 2022-06-05 NOTE — Anesthesia Procedure Notes (Signed)
Procedure Name: LMA Insertion Date/Time: 06/05/2022 8:01 AM  Performed by: Loni Dolly, CRNAPre-anesthesia Checklist: Patient identified, Emergency Drugs available, Suction available and Patient being monitored Patient Re-evaluated:Patient Re-evaluated prior to induction Oxygen Delivery Method: Circle system utilized Preoxygenation: Pre-oxygenation with 100% oxygen Induction Type: IV induction Ventilation: Mask ventilation without difficulty LMA: LMA inserted LMA Size: 5.0 Tube type: Oral Number of attempts: 1 Placement Confirmation: positive ETCO2 Tube secured with: Tape Dental Injury: Teeth and Oropharynx as per pre-operative assessment

## 2022-06-05 NOTE — Transfer of Care (Signed)
Immediate Anesthesia Transfer of Care Note  Patient: Zachary Brewer  Procedure(s) Performed: CARPAL TUNNEL RELEASE (Right: Hand)  Patient Location: PACU  Anesthesia Type:General  Level of Consciousness: awake  Airway & Oxygen Therapy: Patient Spontanous Breathing  Post-op Assessment: Report given to RN and Post -op Vital signs reviewed and stable  Post vital signs: Reviewed and stable  Last Vitals:  Vitals Value Taken Time  BP 139/68 06/05/22 0900  Temp    Pulse 53 06/05/22 0901  Resp 12 06/05/22 0901  SpO2 95 % 06/05/22 0901  Vitals shown include unvalidated device data.  Last Pain:  Vitals:   06/05/22 0640  TempSrc: Temporal  PainSc: 0-No pain         Complications: No notable events documented.

## 2022-06-05 NOTE — Anesthesia Postprocedure Evaluation (Signed)
Anesthesia Post Note  Patient: Zachary Brewer  Procedure(s) Performed: CARPAL TUNNEL RELEASE (Right: Hand)  Patient location during evaluation: PACU Anesthesia Type: General Level of consciousness: awake and alert Pain management: pain level controlled Vital Signs Assessment: post-procedure vital signs reviewed and stable Respiratory status: spontaneous breathing, nonlabored ventilation, respiratory function stable and patient connected to nasal cannula oxygen Cardiovascular status: blood pressure returned to baseline and stable Postop Assessment: no apparent nausea or vomiting Anesthetic complications: no   No notable events documented.   Last Vitals:  Vitals:   06/05/22 0955 06/05/22 1005  BP:  (!) 151/69  Pulse: (!) 54 (!) 53  Resp: 10 14  Temp:  36.5 C  SpO2: 96% 97%    Last Pain:  Vitals:   06/05/22 1005  TempSrc: Temporal  PainSc: [place]

## 2022-06-05 NOTE — Op Note (Signed)
06/05/2022  9:18 AM  PATIENT:  Zachary Brewer    PRE-OPERATIVE DIAGNOSIS:  Right Carpal Tunnel Syndrome  POST-OPERATIVE DIAGNOSIS:  Same  PROCEDURE:  RIGHT OPEN CARPAL TUNNEL RELEASE  SURGEON:  Thornton Park, MD  ANESTHESIA:   General  EBL: minimal  Tourniquet time:  33 minutes  PREOPERATIVE INDICATIONS:  Zachary Brewer is a  81 y.o. male with a diagnosis of Right Carpal Tunnel Syndrome who failed conservative measures and elected for surgical management.    I discussed the risks and benefits of surgery. The risks include but are not limited to infection, bleeding, nerve or blood vessel injury, joint stiffness or loss of motion, persistent pain, weakness, recurrence of symptoms and the need for further surgery. Medical risks include but are not limited to DVT and pulmonary embolism, myocardial infarction, stroke, pneumonia, respiratory failure and death. Patient understood these risks and wished to proceed.   OPERATIVE FINDINGS: Significant median nerve compression at the carpal tunnel, right wrist  OPERATIVE PROCEDURE: Patient was met in the preoperative area with his daughter at the bedside. I signed the right wrist with my initials and the word yes according the hospital's correct site of surgery protocol.  Preop history and physical was performed at the bedside.  I discussed the procedure in detail along with the postoperative course.  I answered all the patient's questions.  The patient was then brought to the operating room where he underwent general with LMA.  The patient was positioned supine on the operative table. The right arm was placed on a hand table. A tourniquet was applied to the right upper extremity.  The right upper extremity was prepped and draped in a sterile fashion. A timeout was performed to verify the patient's name, date of birth, medical record number, correct site of surgery correct procedure to be performed. The time out was also used to confirm the patient  received antibiotics that all necessary instruments were available in the room. The right upper extremity was then exsanguinated with an Esmarch and the tourniquet inflated to 250 mmHg for 33 minutes.   An incision following the palmar crease was made. This was made in line with the web space between the middle and ring fingers and the distal extent of the incision was where it intersected Kaplan's cardinal line. Bleeding vessels were cauterized with a bipolar.  The subcutaneous tissue was carefully dissected out with a Metzenbaum scissor and pickup until the palmar fascia was encountered. The distal extent of the transverse carpal ligament was then identified. A Freer elevator was placed under the transverse carpal ligament running distally to proximally. A micro-Beaver blade was then used to incise the transverse carpal ligament taking care to avoid injury to any neurovascular structures. The carpal tunnel was found to be extremely constricted. There was significant compression on the median nerve. The transverse carpal ligament was completely released. The nerve was visualized in its entirety and the carpal tunnel. The wound was copiously irrigated. The skin was then approximated with 5-0 nylon. Xeroform was placed over the incision. A dry sterile dressing was applied along with a volar fiberglass splint. Patient was overwrapped with an Ace wrap. The tourniquet was deflated at 33 minutes.  Sling was placed on the right upper extremity. The patient was extubated and brought to the PACU in stable condition. I was scrubbed and present the entire case and all sharp, sponge and instrument counts were correct at the conclusion the case.  I met with the patient's daughter in  the preoperative consultation room to let her know the surgery was performed without complication the patient was stable in recovery room.    Timoteo Gaul, MD

## 2022-06-13 DIAGNOSIS — G5601 Carpal tunnel syndrome, right upper limb: Secondary | ICD-10-CM | POA: Diagnosis not present

## 2022-06-20 ENCOUNTER — Emergency Department: Payer: Medicare Other

## 2022-06-20 ENCOUNTER — Encounter: Payer: Self-pay | Admitting: Emergency Medicine

## 2022-06-20 ENCOUNTER — Other Ambulatory Visit: Payer: Self-pay

## 2022-06-20 DIAGNOSIS — M542 Cervicalgia: Secondary | ICD-10-CM | POA: Diagnosis not present

## 2022-06-20 DIAGNOSIS — R140 Abdominal distension (gaseous): Secondary | ICD-10-CM | POA: Diagnosis not present

## 2022-06-20 DIAGNOSIS — W182XXA Fall in (into) shower or empty bathtub, initial encounter: Secondary | ICD-10-CM | POA: Diagnosis not present

## 2022-06-20 DIAGNOSIS — R519 Headache, unspecified: Secondary | ICD-10-CM | POA: Insufficient documentation

## 2022-06-20 DIAGNOSIS — Z5321 Procedure and treatment not carried out due to patient leaving prior to being seen by health care provider: Secondary | ICD-10-CM | POA: Diagnosis not present

## 2022-06-20 DIAGNOSIS — R079 Chest pain, unspecified: Secondary | ICD-10-CM | POA: Diagnosis not present

## 2022-06-20 DIAGNOSIS — Y92002 Bathroom of unspecified non-institutional (private) residence single-family (private) house as the place of occurrence of the external cause: Secondary | ICD-10-CM | POA: Diagnosis not present

## 2022-06-20 DIAGNOSIS — R1030 Lower abdominal pain, unspecified: Secondary | ICD-10-CM | POA: Insufficient documentation

## 2022-06-20 LAB — CBC
HCT: 42 % (ref 39.0–52.0)
Hemoglobin: 14 g/dL (ref 13.0–17.0)
MCH: 31.5 pg (ref 26.0–34.0)
MCHC: 33.3 g/dL (ref 30.0–36.0)
MCV: 94.4 fL (ref 80.0–100.0)
Platelets: 152 10*3/uL (ref 150–400)
RBC: 4.45 MIL/uL (ref 4.22–5.81)
RDW: 13.1 % (ref 11.5–15.5)
WBC: 10.1 10*3/uL (ref 4.0–10.5)
nRBC: 0 % (ref 0.0–0.2)

## 2022-06-20 LAB — URINALYSIS, ROUTINE W REFLEX MICROSCOPIC
Bilirubin Urine: NEGATIVE
Glucose, UA: NEGATIVE mg/dL
Hgb urine dipstick: NEGATIVE
Ketones, ur: NEGATIVE mg/dL
Leukocytes,Ua: NEGATIVE
Nitrite: NEGATIVE
Protein, ur: 30 mg/dL — AB
Specific Gravity, Urine: 1.03 (ref 1.005–1.030)
Squamous Epithelial / HPF: NONE SEEN (ref 0–5)
pH: 5 (ref 5.0–8.0)

## 2022-06-20 LAB — COMPREHENSIVE METABOLIC PANEL
ALT: 24 U/L (ref 0–44)
AST: 30 U/L (ref 15–41)
Albumin: 3.9 g/dL (ref 3.5–5.0)
Alkaline Phosphatase: 96 U/L (ref 38–126)
Anion gap: 6 (ref 5–15)
BUN: 20 mg/dL (ref 8–23)
CO2: 29 mmol/L (ref 22–32)
Calcium: 9.2 mg/dL (ref 8.9–10.3)
Chloride: 105 mmol/L (ref 98–111)
Creatinine, Ser: 0.7 mg/dL (ref 0.61–1.24)
GFR, Estimated: >60 mL/min (ref >60)
Glucose, Bld: 120 mg/dL — ABNORMAL HIGH (ref 70–99)
Potassium: 4.2 mmol/L (ref 3.5–5.1)
Sodium: 140 mmol/L (ref 135–145)
Total Bilirubin: 1.1 mg/dL (ref 0.3–1.2)
Total Protein: 6.5 g/dL (ref 6.5–8.1)

## 2022-06-20 LAB — TROPONIN I (HIGH SENSITIVITY): Troponin I (High Sensitivity): 11 ng/L (ref <18)

## 2022-06-20 LAB — LIPASE, BLOOD: Lipase: 37 U/L (ref 11–51)

## 2022-06-20 NOTE — ED Triage Notes (Signed)
Pt to ED via POV, pt states states fell in the bathtub last night was unable to get up last night. Pt states hit his R elbow, R hip. Pt states laid in the bathroom approx 10-15 mins. Pt states today had lower abd pain a "couple of hours after he started eating". Pt states took 4 alkaseltzer tablets prior to arrival with some relief of the pain. Pt c/o continuing to feel bloated at this time. Pt with no obvious injury to R ribs/R abdominal area, denies pain with palpation.

## 2022-06-21 ENCOUNTER — Emergency Department
Admission: EM | Admit: 2022-06-21 | Discharge: 2022-06-21 | Discharge status: Against Medical Advice | Payer: Medicare Other | Attending: Emergency Medicine | Admitting: Emergency Medicine

## 2022-06-21 NOTE — ED Notes (Signed)
Informed by registration pt is leaving, pt ambualtory with family from lobby in no acute distress.

## 2022-10-15 ENCOUNTER — Other Ambulatory Visit: Payer: Self-pay

## 2022-10-15 DIAGNOSIS — I10 Essential (primary) hypertension: Secondary | ICD-10-CM

## 2022-10-15 DIAGNOSIS — N401 Enlarged prostate with lower urinary tract symptoms: Secondary | ICD-10-CM

## 2022-10-15 DIAGNOSIS — Z0000 Encounter for general adult medical examination without abnormal findings: Secondary | ICD-10-CM

## 2022-10-15 DIAGNOSIS — E782 Mixed hyperlipidemia: Secondary | ICD-10-CM

## 2022-10-15 DIAGNOSIS — R739 Hyperglycemia, unspecified: Secondary | ICD-10-CM

## 2022-10-16 ENCOUNTER — Other Ambulatory Visit: Payer: Medicare Other

## 2022-10-16 DIAGNOSIS — E782 Mixed hyperlipidemia: Secondary | ICD-10-CM | POA: Diagnosis not present

## 2022-10-16 DIAGNOSIS — N138 Other obstructive and reflux uropathy: Secondary | ICD-10-CM | POA: Diagnosis not present

## 2022-10-16 DIAGNOSIS — R739 Hyperglycemia, unspecified: Secondary | ICD-10-CM | POA: Diagnosis not present

## 2022-10-16 DIAGNOSIS — I10 Essential (primary) hypertension: Secondary | ICD-10-CM | POA: Diagnosis not present

## 2022-10-17 LAB — COMPLETE METABOLIC PANEL WITH GFR
AG Ratio: 1.9 (calc) (ref 1.0–2.5)
ALT: 15 U/L (ref 9–46)
AST: 20 U/L (ref 10–35)
Albumin: 4 g/dL (ref 3.6–5.1)
Alkaline phosphatase (APISO): 94 U/L (ref 35–144)
BUN: 14 mg/dL (ref 7–25)
CO2: 32 mmol/L (ref 20–32)
Calcium: 9.4 mg/dL (ref 8.6–10.3)
Chloride: 104 mmol/L (ref 98–110)
Creat: 0.86 mg/dL (ref 0.70–1.22)
Globulin: 2.1 g/dL (calc) (ref 1.9–3.7)
Glucose, Bld: 108 mg/dL — ABNORMAL HIGH (ref 65–99)
Potassium: 4.4 mmol/L (ref 3.5–5.3)
Sodium: 144 mmol/L (ref 135–146)
Total Bilirubin: 1 mg/dL (ref 0.2–1.2)
Total Protein: 6.1 g/dL (ref 6.1–8.1)
eGFR: 86 mL/min/{1.73_m2} (ref >=60)

## 2022-10-17 LAB — CBC WITH DIFFERENTIAL/PLATELET
Absolute Monocytes: 429 cells/uL (ref 200–950)
Basophils Absolute: 19 cells/uL (ref 0–200)
Basophils Relative: 0.3 %
Eosinophils Absolute: 32 cells/uL (ref 15–500)
Eosinophils Relative: 0.5 %
HCT: 47.3 % (ref 38.5–50.0)
Hemoglobin: 16.3 g/dL (ref 13.2–17.1)
Lymphs Abs: 1005 cells/uL (ref 850–3900)
MCH: 32.7 pg (ref 27.0–33.0)
MCHC: 34.5 g/dL (ref 32.0–36.0)
MCV: 94.8 fL (ref 80.0–100.0)
MPV: 11.1 fL (ref 7.5–12.5)
Monocytes Relative: 6.7 %
Neutro Abs: 4915 cells/uL (ref 1500–7800)
Neutrophils Relative %: 76.8 %
Platelets: 159 10*3/uL (ref 140–400)
RBC: 4.99 10*6/uL (ref 4.20–5.80)
RDW: 12.4 % (ref 11.0–15.0)
Total Lymphocyte: 15.7 %
WBC: 6.4 10*3/uL (ref 3.8–10.8)

## 2022-10-17 LAB — LIPID PANEL
Cholesterol: 140 mg/dL (ref <200)
HDL: 42 mg/dL (ref >=40)
LDL Cholesterol (Calc): 79 mg/dL (calc)
Non-HDL Cholesterol (Calc): 98 mg/dL (calc) (ref <130)
Total CHOL/HDL Ratio: 3.3 (calc) (ref <5.0)
Triglycerides: 100 mg/dL (ref <150)

## 2022-10-17 LAB — HEMOGLOBIN A1C
Hgb A1c MFr Bld: 5.5 % of total Hgb (ref <5.7)
Mean Plasma Glucose: 111 mg/dL
eAG (mmol/L): 6.2 mmol/L

## 2022-10-17 LAB — PSA: PSA: 0.32 ng/mL (ref <=4.00)

## 2022-10-24 ENCOUNTER — Encounter: Payer: Self-pay | Admitting: Family Medicine

## 2022-10-24 ENCOUNTER — Ambulatory Visit (INDEPENDENT_AMBULATORY_CARE_PROVIDER_SITE_OTHER): Payer: Medicare Other | Admitting: Family Medicine

## 2022-10-24 VITALS — BP 130/64 | HR 75 | Ht 67.0 in | Wt 149.8 lb

## 2022-10-24 DIAGNOSIS — M1120 Other chondrocalcinosis, unspecified site: Secondary | ICD-10-CM | POA: Diagnosis not present

## 2022-10-24 DIAGNOSIS — I10 Essential (primary) hypertension: Secondary | ICD-10-CM

## 2022-10-24 DIAGNOSIS — G894 Chronic pain syndrome: Secondary | ICD-10-CM | POA: Diagnosis not present

## 2022-10-24 DIAGNOSIS — E782 Mixed hyperlipidemia: Secondary | ICD-10-CM

## 2022-10-24 DIAGNOSIS — N401 Enlarged prostate with lower urinary tract symptoms: Secondary | ICD-10-CM | POA: Diagnosis not present

## 2022-10-24 DIAGNOSIS — N138 Other obstructive and reflux uropathy: Secondary | ICD-10-CM | POA: Diagnosis not present

## 2022-10-24 DIAGNOSIS — Z0000 Encounter for general adult medical examination without abnormal findings: Secondary | ICD-10-CM | POA: Diagnosis not present

## 2022-10-24 DIAGNOSIS — K2100 Gastro-esophageal reflux disease with esophagitis, without bleeding: Secondary | ICD-10-CM

## 2022-10-24 DIAGNOSIS — J3089 Other allergic rhinitis: Secondary | ICD-10-CM | POA: Diagnosis not present

## 2022-10-24 MED ORDER — AZELASTINE HCL 0.1 % NA SOLN: 2.0000 | Freq: Two times a day (BID) | NASAL | 12 refills | Status: DC

## 2022-10-24 NOTE — Assessment & Plan Note (Signed)
Controlled HYPERTENSION - Home BP readings normal, detailed log - occasional elevated SBP before takes med  No known complications    Plan:  1. Continue Enalapril '20mg'$  BID 2. Encourage improved lifestyle - low sodium diet, regular exercise 3. Continue monitor BP outside office, bring readings to next visit, if persistently >140/90 or new symptoms notify office sooner

## 2022-10-24 NOTE — Assessment & Plan Note (Signed)
Stable chronic problem S/p gastric outlet obstruction, bypass surgery Refill Omeprazole PPI '20mg'$  BID

## 2022-10-24 NOTE — Assessment & Plan Note (Signed)
Controlled cholesterol on lifestyle Last lipid panel 09/2022 Calculated ASCVD 10 yr risk score elevated due to age, HTN  Plan: 1. Briefly reviewed ASCVD risk - consider low dose statin for future risk reduction, but agree to defer for now, patient not interested in starting new med 2. Continue ASA '81mg'$  for primary ASCVD risk reduction 3. Encourage improved lifestyle - low carb/cholesterol, reduce portion size, continue improving regular exercise 4. Follow-up yearly lipids

## 2022-10-24 NOTE — Assessment & Plan Note (Addendum)
Continue OTC Allegra Continue Monelukast '10mg'$  nightly - seems less useful recently Add Azelastine nasal spray

## 2022-10-24 NOTE — Patient Instructions (Addendum)
Thank you for coming to the office today.  Updated vaccines.  Blood results are excellent.  Kidney and Liver function is normal.  Cholesterol is controlled.  Blood sugar average is normal. Not in range of Pre Diabetes.  No anemia on blood test.  PSA Lab is normal. No sign of Prostate Cancer  3 months until next refills.  New rx Nasal Spray for allergies Azelastine 2 sprays twice a day  Please schedule a Follow-up Appointment to: Return in about 6 months (around 04/25/2023) for 6 month Follow-up HTN.  If you have any other questions or concerns, please feel free to call the office or send a message through [facility]. You may also schedule an earlier appointment if necessary.  Additionally, you may be receiving a survey about your experience at our office within a few days to 1 week by e-mail or mail. We value your feedback.  [name], DO [place]

## 2022-10-24 NOTE — Assessment & Plan Note (Addendum)
Joint flare up with knees, doing stable at this time

## 2022-10-24 NOTE — Progress Notes (Signed)
Subjective:    Patient ID: Zachary Brewer, male    DOB: 12/04/1940, 81 y.o.   MRN: [id number]  Zachary Brewer is a 81 y.o. male presenting on 10/24/2022 for Annual Exam   HPI  Here for Annual Physical and Lab Review  CHRONIC HTN: Reports no new concerns. Doing well. BP readings have been controlled. Home BP mostly controlled, checks regularly. Has had some normal readings Admits some salty foods lately Current Meds - Enalapril 19m BID Reports good compliance, took meds today. Tolerating well, w/o complaints. Rare dizziness episode Denies CP, dyspnea, HA, edema, dizziness / lightheadedness   History of Elevated Blood Sugar: A1c up to 5.5, from 5.0 to 5.3 range Fam history DM CBGs: Avg 100s, Low >85 High < 120. Checks CBGs x 1 weekly fasting - has detailed log Meds: None (never) Currently on ACEi Lifestyle: - Diet: Healthy diet. No significant change - Exercise: regular activity some walking Denies hypoglycemia   History of Gastric Outlet Obstruction (S/p Roux-en-Y surgery) / History of weight loss / GERD: - Briefly reviews prior history of s/p gastric surgery in past see prior note for background information - Tolerating diet, has dentures but problem with denture adhesive, see below, stays well hydrated - Taking Omeprazole 228mBID for GERD   BPH Reports chronic problem. 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 [facility] (Dr BehaAnnalee Gentaontrolled on regular Tylenol dosing - He has been on prednisone PRN in past. - Taking supplements Total Beets and Osteo-Bi Flex   R Hand Carpal Tunnel S/p Carpal Tunnel release  by Dr KrasMack Guise023 Improved overall   Health Maintenance: Updated Shingrix x 2 doses completed 02/2022, 04/2022 COVID Booster updated 08/2022       04/18/2022    3:40 PM 02/20/2022    8:13 AM 06/20/2021   11:32 AM  Depression screen PHQ 2/9  Decreased Interest 0 0 0  Down, Depressed, Hopeless 0 0 0  PHQ - 2 Score 0 0 0  Altered sleeping 0    Tired, decreased energy 0    Change in appetite 0    Feeling bad or failure about yourself  0    Trouble concentrating 0    Moving slowly or fidgety/restless 0    Suicidal thoughts 0    PHQ-9 Score 0    Difficult doing work/chores Not difficult at all      Past Medical History:  Diagnosis Date   Allergy    Arthritis 05/30/2015   Gout - Left knee and ankle   Cervical pain 05/30/2015   GERD (gastroesophageal reflux disease)    Hyperlipidemia    Hypertension    Irregular heart rhythm    Lipoma of neck 05/30/2015   Wears dentures    full upper and lower (doesn't wear lower)   Wears hearing aid    bilateral   Past Surgical History:  Procedure Laterality Date   CARPAL TUNNEL RELEASE Right 06/05/2022   Procedure: CARPAL TUNNEL RELEASE;  Surgeon: KrasThornton Park;  Location: [facility];  Service: Orthopedics;  Laterality: Right;   CATARACT EXTRACTION W/PHACO Right 06/11/2016   Procedure: CATARACT EXTRACTION PHACO AND  INTRAOCULAR LENS PLACEMENT (IOC);  Surgeon: Leandrew Koyanagi, MD;  Location: [place];  Service: Ophthalmology;  Laterality: Right;   CATARACT EXTRACTION W/PHACO Left 07/09/2016   Procedure: CATARACT EXTRACTION PHACO AND INTRAOCULAR LENS PLACEMENT (IOC);  Surgeon: Leandrew Koyanagi, MD;  Location: [place];  Service: Ophthalmology;  Laterality: Left;  MALYUGIN   ESOPHAGOGASTRODUODENOSCOPY (EGD) WITH PROPOFOL N/A 06/20/2015   Procedure: ESOPHAGOGASTRODUODENOSCOPY (EGD) WITH PROPOFOL with dialation;  Surgeon: Lucilla Lame, MD;  Location: [place];  Service: Endoscopy;  Laterality: N/A;    FLEXIBLE BRONCHOSCOPY N/A 06/22/2015   Procedure: FLEXIBLE BRONCHOSCOPY;  Surgeon: Allyne Gee, MD;  Location: [facility];  Service: Pulmonary;  Laterality: N/A;   GASTRIC OUTLET OBSTRUCTION RELEASE  2013   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   UPPER GI ENDOSCOPY     Social History   Socioeconomic History   Marital status: Widowed    Spouse name: Not on file   Number of children: 3   Years of education: Not on file   Highest education level: 6th grade  Occupational History   Occupation: retired  Tobacco Use   Smoking status: Never   Smokeless tobacco: Never  Vaping Use   Vaping Use: Never used  Substance and Sexual Activity   Alcohol use: No   Drug use: No   Sexual activity: Not on file  Other Topics Concern   Not on file  Social History Narrative   Not on file   Social Determinants of Health   Financial Resource Strain: Low Risk  (02/20/2022)   Overall Financial Resource Strain (CARDIA)    Difficulty of Paying Living Expenses: Not hard at all  Food Insecurity: No Food Insecurity (02/20/2022)   Hunger Vital Sign    Worried About Running Out of Food in the Last Year: Never true    [place] in the Last Year: Never true  Transportation Needs: No Transportation Needs (02/20/2022)   PRAPARE - Hydrologist (Medical): No    Lack of Transportation (Non-Medical): No  Physical Activity: Insufficiently Active (02/20/2022)   Exercise Vital Sign    Days of Exercise per Week: 2 days    Minutes of Exercise per Session: 20 min  Stress: No Stress Concern Present (02/20/2022)   [place]    Feeling of Stress : Not at all  Social Connections: Moderately Isolated (06/20/2021)   Social Connection and Isolation Panel [NHANES]    Frequency of Communication with Friends and Family: More than three times a week    Frequency of Social Gatherings with Friends and Family: More than three times a week     Attends Religious Services: More than 4 times per year    Active Member of [employer] or Organizations: No    Attends Archivist Meetings: Never    Marital Status: Widowed  Intimate Partner Violence: Not At Risk (02/20/2022)   Humiliation, Afraid, Rape, and Kick questionnaire    Fear of Current or Ex-Partner: No    Emotionally Abused: No    Physically Abused: No    Sexually Abused: No   Family History  Problem Relation Age of Onset   Diabetes Mother    Alzheimer's disease Mother    Heart disease Father    Diabetes Sister    Diabetes Brother    Stroke Brother    Mental illness Brother    Hypertension Paternal Grandfather    Current Outpatient Medications on File Prior  to Visit  Medication Sig   Accu-Chek FastClix Lancets MISC Use to check blood sugar up to 1 x weekly   aspirin EC 81 MG tablet Take 81 mg by mouth daily.   Blood Glucose Monitoring Suppl (ACCU-CHEK AVIVA PLUS) w/Device KIT Use glucometer to check blood sugar 1 x weekly as advised   Cholecalciferol (VITAMIN D3) 1000 units CAPS Take 1,000 Units by mouth daily.   enalapril (VASOTEC) 20 MG tablet Take 1 tablet (20 mg total) by mouth 2 (two) times daily.   finasteride (PROSCAR) 5 MG tablet Take 1 tablet (5 mg total) by mouth daily.   Glucosamine-Chondroit-Vit C-Mn (GLUCOSAMINE 1500 COMPLEX PO) Take 1,500 mg by mouth daily.   glucose blood (ACCU-CHEK AVIVA PLUS) test strip Use to check once per day   montelukast (SINGULAIR) 10 MG tablet Take 1 tablet (10 mg total) by mouth at bedtime.   Multiple Vitamin (MULTIVITAMIN) tablet Take 1 tablet by mouth daily. Reported on 11/21/2015   omeprazole (PRILOSEC) 20 MG capsule Take 1 capsule (20 mg total) by mouth 2 (two) times daily before a meal.   psyllium (METAMUCIL) 58.6 % powder Take 1 packet by mouth daily.   tamsulosin (FLOMAX) 0.4 MG CAPS capsule Take 2 capsules (0.8 mg total) by mouth daily after breakfast.   No current facility-administered medications on file prior  to visit.    Review of Systems  Constitutional:  Negative for activity change, appetite change, chills, diaphoresis, fatigue and fever.  HENT:  Negative for congestion and hearing loss.   Eyes:  Negative for visual disturbance.  Respiratory:  Negative for cough, chest tightness, shortness of breath and wheezing.   Cardiovascular:  Negative for chest pain, palpitations and leg swelling.  Gastrointestinal:  Negative for abdominal pain, constipation, diarrhea, nausea and vomiting.  Genitourinary:  Negative for dysuria, frequency and hematuria.  Musculoskeletal:  Negative for arthralgias and neck pain.  Skin:  Negative for rash.  Neurological:  Negative for dizziness, weakness, light-headedness, numbness and headaches.  Hematological:  Negative for adenopathy.  Psychiatric/Behavioral:  Negative for behavioral problems, dysphoric mood and sleep disturbance.    Per HPI unless specifically indicated above     Objective:    BP 130/64 (BP Location: Left Arm, Cuff Size: Normal)   Pulse 75   Ht _0  (1.702 m)   Wt 149 lb 12.8 oz (67.9 kg)   SpO2 98%   BMI 23.46 kg/m   Wt Readings from Last 3 Encounters:  10/24/22 149 lb 12.8 oz (67.9 kg)  06/20/22 142 lb (64.4 kg)  06/05/22 142 lb (64.4 kg)    Physical Exam Vitals and nursing note reviewed.  Constitutional:      General: He is not in acute distress.    Appearance: He is well-developed. He is not diaphoretic.     Comments: Well-appearing, comfortable, cooperative  HENT:     Head: Normocephalic and atraumatic.  Eyes:     General:        Right eye: No discharge.        Left eye: No discharge.     Conjunctiva/sclera: Conjunctivae normal.     Pupils: Pupils are equal, round, and reactive to light.  Neck:     Thyroid: No thyromegaly.  Cardiovascular:     Rate and Rhythm: Normal rate and regular rhythm.     Pulses: Normal pulses.     Heart sounds: Normal heart sounds. No murmur heard. Pulmonary:     Effort: Pulmonary effort is  normal. No respiratory distress.  Breath sounds: Normal breath sounds. No wheezing or rales.  Abdominal:     General: Bowel sounds are normal. There is no distension.     Palpations: Abdomen is soft. There is no mass.     Tenderness: There is no abdominal tenderness.  Musculoskeletal:        General: No tenderness. Normal range of motion.     Cervical back: Normal range of motion and neck supple.     Right lower leg: No edema.     Left lower leg: No edema.     Comments: Upper / Lower Extremities: - Normal muscle tone, strength bilateral upper extremities 5/5, lower extremities 5/5  Lymphadenopathy:     Cervical: No cervical adenopathy.  Skin:    General: Skin is warm and dry.     Findings: No erythema or rash.  Neurological:     Mental Status: He is alert and oriented to person, place, and time.     Comments: Distal sensation intact to light touch all extremities  Psychiatric:        Mood and Affect: Mood normal.        Behavior: Behavior normal.        Thought Content: Thought content normal.     Comments: Well groomed, good eye contact, normal speech and thoughts     Results for orders placed or performed in visit on 10/15/22  PSA  Result Value Ref Range   PSA 0.32 < OR = 4.00 ng/mL  Hemoglobin A1c  Result Value Ref Range   Hgb A1c MFr Bld 5.5 <5.7 % of total Hgb   Mean Plasma Glucose 111 mg/dL   eAG (mmol/L) 6.2 mmol/L  CBC with Differential/Platelet  Result Value Ref Range   WBC 6.4 3.8 - 10.8 Thousand/uL   RBC 4.99 4.20 - 5.80 Million/uL   Hemoglobin 16.3 13.2 - 17.1 g/dL   HCT 47.3 38.5 - 50.0 %   MCV 94.8 80.0 - 100.0 fL   MCH 32.7 27.0 - 33.0 pg   MCHC 34.5 32.0 - 36.0 g/dL   RDW 12.4 11.0 - 15.0 %   Platelets 159 140 - 400 Thousand/uL   MPV 11.1 7.5 - 12.5 fL   Neutro Abs 4,915 1,500 - 7,800 cells/uL   Lymphs Abs 1,005 850 - 3,900 cells/uL   Absolute Monocytes 429 200 - 950 cells/uL   Eosinophils Absolute 32 15 - 500 cells/uL   Basophils Absolute 19 0  - 200 cells/uL   Neutrophils Relative % 76.8 %   Total Lymphocyte 15.7 %   Monocytes Relative 6.7 %   Eosinophils Relative 0.5 %   Basophils Relative 0.3 %  Lipid panel  Result Value Ref Range   Cholesterol 140 <200 mg/dL   HDL 42 > OR = 40 mg/dL   Triglycerides 100 <150 mg/dL   LDL Cholesterol (Calc) 79 mg/dL (calc)   Total CHOL/HDL Ratio 3.3 <5.0 (calc)   Non-HDL Cholesterol (Calc) 98 <130 mg/dL (calc)  COMPLETE METABOLIC PANEL WITH GFR  Result Value Ref Range   Glucose, Bld 108 (H) 65 - 99 mg/dL   BUN 14 7 - 25 mg/dL   Creat 0.86 0.70 - 1.22 mg/dL   eGFR 86 > OR = 60 mL/min/1.9m   BUN/Creatinine Ratio SEE NOTE: 6 - 22 (calc)   Sodium 144 135 - 146 mmol/L   Potassium 4.4 3.5 - 5.3 mmol/L   Chloride 104 98 - 110 mmol/L   CO2 32 20 - 32 mmol/L   Calcium 9.4  8.6 - 10.3 mg/dL   Total Protein 6.1 6.1 - 8.1 g/dL   Albumin 4.0 3.6 - 5.1 g/dL   Globulin 2.1 1.9 - 3.7 g/dL (calc)   AG Ratio 1.9 1.0 - 2.5 (calc)   Total Bilirubin 1.0 0.2 - 1.2 mg/dL   Alkaline phosphatase (APISO) 94 35 - 144 U/L   AST 20 10 - 35 U/L   ALT 15 9 - 46 U/L      Assessment & Plan:   Problem List Items Addressed This Visit     Allergic rhinitis    Continue OTC Allegra Continue Monelukast 72m nightly - seems less useful recently Add Azelastine nasal spray      Relevant Medications   azelastine (ASTELIN) 0.1 % nasal spray   BPH with obstruction/lower urinary tract symptoms   Chronic pain syndrome   Esophagitis, reflux    Stable chronic problem S/p gastric outlet obstruction, bypass surgery Refill Omeprazole PPI 257mBID      Essential (primary) hypertension    Controlled HYPERTENSION - Home BP readings normal, detailed log - occasional elevated SBP before takes med  No known complications    Plan:  1. Continue Enalapril 2016mID 2. Encourage improved lifestyle - low sodium diet, regular exercise 3. Continue monitor BP outside office, bring readings to next visit, if persistently  >140/90 or new symptoms notify office sooner      Hyperlipidemia    Controlled cholesterol on lifestyle Last lipid panel 09/2022 Calculated ASCVD 10 yr risk score elevated due to age, HTN  Plan: 1. Briefly reviewed ASCVD risk - consider low dose statin for future risk reduction, but agree to defer for now, patient not interested in starting new med 2. Continue ASA 56m67mr primary ASCVD risk reduction 3. Encourage improved lifestyle - low carb/cholesterol, reduce portion size, continue improving regular exercise 4. Follow-up yearly lipids      Pseudogout    Joint flare up with knees, doing stable at this time      Other Visit Diagnoses     Annual physical exam    -  Primary       Updated Health Maintenance information Reviewed recent lab results with patient Encouraged improvement to lifestyle with diet and exercise Goal of weight loss   Meds ordered this encounter  Medications   azelastine (ASTELIN) 0.1 % nasal spray    Sig: Place 2 sprays into both nostrils 2 (two) times daily. Use in each nostril as directed    Dispense:  30 mL    Refill:  12      Follow up plan: Return in about 6 months (around 04/25/2023) for 6 month Follow-up HTN.  AlexNobie Putnam [facility] 10/24/2022, 8:30 AM

## 2022-11-28 DIAGNOSIS — M159 Polyosteoarthritis, unspecified: Secondary | ICD-10-CM | POA: Diagnosis not present

## 2022-11-28 DIAGNOSIS — M1180 Other specified crystal arthropathies, unspecified site: Secondary | ICD-10-CM | POA: Diagnosis not present

## 2022-11-28 DIAGNOSIS — M65351 Trigger finger, right little finger: Secondary | ICD-10-CM | POA: Diagnosis not present

## 2023-01-28 ENCOUNTER — Telehealth: Payer: Self-pay | Admitting: Family Medicine

## 2023-01-28 ENCOUNTER — Other Ambulatory Visit: Payer: Self-pay | Admitting: Family Medicine

## 2023-01-28 DIAGNOSIS — N401 Enlarged prostate with lower urinary tract symptoms: Secondary | ICD-10-CM

## 2023-01-28 DIAGNOSIS — I10 Essential (primary) hypertension: Secondary | ICD-10-CM

## 2023-01-28 NOTE — Telephone Encounter (Signed)
Requested Prescriptions  Pending Prescriptions Disp Refills   enalapril (VASOTEC) 20 MG tablet [Pharmacy Med Name: ENALAPRIL 20MG  TABLETS] 180 tablet 0    Sig: TAKE 1 TABLET(20 MG) BY MOUTH TWICE DAILY     Cardiovascular:  ACE Inhibitors Passed - 01/28/2023  9:17 AM      Passed - Cr in normal range and within 180 days    Creat  Date Value Ref Range Status  10/16/2022 0.86 0.70 - 1.22 mg/dL Final         Passed - K in normal range and within 180 days    Potassium  Date Value Ref Range Status  10/16/2022 4.4 3.5 - 5.3 mmol/L Final  11/27/2013 3.7 3.5 - 5.1 mmol/L Final         Passed - Patient is not pregnant      Passed - Last BP in normal range    BP Readings from Last 1 Encounters:  10/24/22 130/64         Passed - Valid encounter within last 6 months    Recent Outpatient Visits           3 months ago Annual physical exam   [facility] [name], DO   9 months ago Essential (primary) hypertension   [place], DO   1 year ago Annual physical exam   [facility] [name], DO   1 year ago Essential (primary) hypertension   [place], Alexander J, DO   2 years ago Essential (primary) hypertension   [place], DO       Future Appointments             In 2 months [place], Devonne Doughty, DO [facility]             finasteride (PROSCAR) 5 MG tablet [Pharmacy Med Name: FINASTERIDE 5MG  TABLETS] 90 tablet 2    Sig: TAKE 1 TABLET(5 MG) BY MOUTH DAILY     Urology: 5-alpha Reductase Inhibitors Passed - 01/28/2023  9:17 AM      Passed - PSA in normal range and within 360 days    PSA  Date Value Ref Range Status  10/16/2022 0.32 < OR = 4.00 ng/mL Final    Comment:    The total PSA value from this assay  system is  standardized against the WHO standard. The test  result will be approximately 20% lower when compared  to the equimolar-standardized total PSA (Beckman  Coulter). Comparison of serial PSA results should be  interpreted with this fact in mind. . This test was performed using the Siemens  chemiluminescent method. Values obtained from  different assay methods cannot be used interchangeably. PSA levels, regardless of value, should not be interpreted as absolute evidence of the presence or absence of disease.          Passed - Valid encounter within last 12 months    Recent Outpatient Visits           3 months ago Annual physical exam   [facility] [name], DO   9 months ago Essential (primary) hypertension   [place], DO   1 year ago Annual physical exam   [facility] [place], Sheppard Coil  J, DO   1 year ago Essential (primary) hypertension   [facility] [place], Devonne Doughty, DO   2 years ago Essential (primary) hypertension   [place], DO       Future Appointments             In 2 months Parks Ranger, Devonne Doughty, DO [facility]

## 2023-01-28 NOTE — Telephone Encounter (Signed)
Carley from the pharmacy states pt came in confused today stating that he should have more medications filled for him than what she filled. Nila Nephew states that they filled: enalapril (VASOTEC) 20 MG tablet  finasteride (PROSCAR) 5 MG tablet  azelastine (ASTELIN) 0.1 % nasal spray   Carley is wanting to make sure they are not suppose to fill any other medications for the pt.     Please call Carley back [place], [place] [facility] Phone: [phone]  Fax: [phone]

## 2023-01-28 NOTE — Telephone Encounter (Signed)
Call to patient- he has everything he needs now- he was out of finasteride. Patient advised he maybe running out of other Rx that have expired and he has been informed to call for refill when needed. Patient voiced understanding. Patient reassured just to reach out to office if needed.

## 2023-02-12 ENCOUNTER — Telehealth: Payer: Self-pay | Admitting: Family Medicine

## 2023-02-12 NOTE — Telephone Encounter (Signed)
Contacted [name] to schedule their annual wellness visit. Call back at later date: 02/12/23 Call Fayrene Fearing number to scheduled today  Verlee Rossetti; Care Guide Ambulatory Clinical Support [place] [facility] Direct Dial: [phone]

## 2023-02-12 NOTE — Telephone Encounter (Signed)
Contacted [name] to schedule their annual wellness visit. Appointment made for 02/27/2023.  Zachary Brewer; Care Guide Ambulatory Clinical Support [place] [facility] Direct Dial: [phone]

## 2023-02-25 ENCOUNTER — Other Ambulatory Visit: Payer: Self-pay

## 2023-02-25 ENCOUNTER — Emergency Department: Payer: 59

## 2023-02-25 ENCOUNTER — Emergency Department
Admission: EM | Admit: 2023-02-25 | Discharge: 2023-02-25 | Disposition: A | Discharge status: Home / Self Care | Payer: 59 | Attending: Emergency Medicine | Admitting: Emergency Medicine

## 2023-02-25 DIAGNOSIS — S0990XA Unspecified injury of head, initial encounter: Secondary | ICD-10-CM | POA: Diagnosis not present

## 2023-02-25 DIAGNOSIS — S0003XA Contusion of scalp, initial encounter: Secondary | ICD-10-CM | POA: Diagnosis not present

## 2023-02-25 DIAGNOSIS — Z23 Encounter for immunization: Secondary | ICD-10-CM | POA: Insufficient documentation

## 2023-02-25 DIAGNOSIS — I499 Cardiac arrhythmia, unspecified: Secondary | ICD-10-CM | POA: Diagnosis not present

## 2023-02-25 DIAGNOSIS — S60311A Abrasion of right thumb, initial encounter: Secondary | ICD-10-CM | POA: Insufficient documentation

## 2023-02-25 DIAGNOSIS — S0081XA Abrasion of other part of head, initial encounter: Secondary | ICD-10-CM | POA: Insufficient documentation

## 2023-02-25 DIAGNOSIS — S60512A Abrasion of left hand, initial encounter: Secondary | ICD-10-CM | POA: Diagnosis not present

## 2023-02-25 DIAGNOSIS — Y9301 Activity, walking, marching and hiking: Secondary | ICD-10-CM | POA: Insufficient documentation

## 2023-02-25 DIAGNOSIS — W0110XA Fall on same level from slipping, tripping and stumbling with subsequent striking against unspecified object, initial encounter: Secondary | ICD-10-CM | POA: Diagnosis not present

## 2023-02-25 DIAGNOSIS — S60511A Abrasion of right hand, initial encounter: Secondary | ICD-10-CM

## 2023-02-25 DIAGNOSIS — W19XXXA Unspecified fall, initial encounter: Secondary | ICD-10-CM | POA: Diagnosis not present

## 2023-02-25 DIAGNOSIS — Z743 Need for continuous supervision: Secondary | ICD-10-CM | POA: Diagnosis not present

## 2023-02-25 MED ORDER — TETANUS-DIPHTH-ACELL PERTUSSIS 5-2.5-18.5 LF-MCG/0.5 IM SUSY
0.5000 mL | PREFILLED_SYRINGE | Freq: Once | INTRAMUSCULAR | Status: AC
  Administered 2023-02-25: 0.5 mL via INTRAMUSCULAR
  Filled 2023-02-25: qty 0.5

## 2023-02-25 MED ORDER — BACITRACIN-NEOMYCIN-POLYMYXIN OINTMENT TUBE
TOPICAL_OINTMENT | Freq: Once | CUTANEOUS | Status: AC
  Filled 2023-02-25: qty 14.17

## 2023-02-25 NOTE — ED Provider Notes (Signed)
[facility] Provider Note    Event Date/Time   First MD Initiated Contact with Patient 02/25/23 1156     (approximate)   History   Head Laceration, Fall, and Finger Injury (Left thumb and right pinky abrasions)   HPI  Zachary Brewer is a 82 y.o. male presents to the emergency department after having a fall.  Patient states that he believes he overdid it and was walking downtown without his walker or cane and then had a fall.  States that he normally ambulates with a cane or walker but he did not have it with him.  Believes he got tripped up going over a curb.  Hit his head and both of his hands.  Denies any pain.  Ambulatory on scene.  Denies any neck pain.  Not on anticoagulation.  Denies any chest pain or shortness of breath.  Denies any back pain or pain in his hips.  Denies any preceding symptoms of chest pain, shortness of breath, dysuria.  Does not want anything for pain.  Has not received anything for pain prior to arrival.     Physical Exam   Triage Vital Signs: ED Triage Vitals  Enc Vitals Group     BP 02/25/23 1156 104/86     Pulse Rate 02/25/23 1156 90     Resp 02/25/23 1156 15     Temp 02/25/23 1156 98.8 F (37.1 C)     Temp Source 02/25/23 1156 Oral     SpO2 02/25/23 1156 96 %     Weight 02/25/23 1158 143 lb (64.9 kg)     Height 02/25/23 1158 5\' 7"  (1.702 m)     Head Circumference --      Peak Flow --      Pain Score 02/25/23 1157 0     Pain Loc --      Pain Edu? --      Excl. in GC? --     Most recent vital signs: Vitals:   02/25/23 1156  BP: 104/86  Pulse: 90  Resp: 15  Temp: 98.8 F (37.1 C)  SpO2: 96%    Physical Exam HENT:     Head:     Comments: Large abrasion to the forehead without laceration, hemostatic.  No septal hematoma.  Upper dentures are intact.  No lower dentures.    Right Ear: External ear normal.     Left Ear: External ear normal.  Eyes:     Extraocular Movements: Extraocular movements intact.      Pupils: Pupils are equal, round, and reactive to light.  Cardiovascular:     Rate and Rhythm: Normal rate.     Comments: No tenderness to palpation to the chest wall Pulmonary:     Effort: Pulmonary effort is normal.  Abdominal:     General: There is no distension.  Musculoskeletal:        General: Normal range of motion.     Cervical back: No tenderness.     Comments: Abrasion to the right thumb and the left hand.  No underlying bony tenderness to palpation.  Full flexion and extension of all fingers.  No tenderness to palpation to the wrist, elbow or upper extremity.  No tenderness to midline thoracic or lumbar spine.  No tenderness to bilateral pelvis or lower extremities.  Skin:    General: Skin is warm.  Neurological:     Mental Status: He is alert. Mental status is at baseline.  IMPRESSION / MDM / ASSESSMENT AND PLAN / ED COURSE  I reviewed the triage vital signs and the nursing notes.  Differential diagnosis including intracranial hemorrhage, concussion, forehead hematoma, abrasion to hands.  No underlying bony tenderness to palpation to bilateral hands, have a low suspicion for fracture or dislocation.  No tenderness to cervical spine or back.  Do not feel that CT cervical spine is necessary at this time.  No findings concerning for radiculopathy.   RADIOLOGY I independently reviewed imaging, my interpretation of imaging: CT scan of the head shows no signs of intracranial hemorrhage or infarction.  Read as no acute findings.   Labs (all labs ordered are listed, but only abnormal results are displayed) Labs interpreted as -    Labs Reviewed - No data to display    Patient's tetanus was updated.  CT scan without signs of intracranial hemorrhage or infarction.  Able to ambulate in the emergency department.  Patient's wounds were cleaned and irrigated, applied bacitracin and wounds were dressed.  Discussed close follow-up with primary care physician.  Discussed return  precautions for any worsening symptoms.  Encouraged to ambulate with his walker anytime he is walking in order to avoid any falls.   PROCEDURES:  Critical Care performed: No  Procedures  Patient's presentation is most consistent with acute presentation with potential threat to life or bodily function.   MEDICATIONS ORDERED IN ED: Medications  Tdap (BOOSTRIX) injection 0.5 mL (has no administration in time range)  neomycin-bacitracin-polymyxin (NEOSPORIN) ointment (has no administration in time range)    FINAL CLINICAL IMPRESSION(S) / ED DIAGNOSES   Final diagnoses:  Injury of head, initial encounter  Hand abrasion, right, initial encounter  Hand abrasion, left, initial encounter     Rx / DC Orders   ED Discharge Orders     None        Note:  This document was prepared using Dragon voice recognition software and may include unintentional dictation errors.   Corena Herter, MD 02/25/23 1235

## 2023-02-25 NOTE — Discharge Instructions (Signed)
You were seen in the emergency department after having a fall with head injury.  You had a CT scan done of your head that did not show any internal bleeding.  Your tetanus was updated.  You can take Tylenol for pain control.  Return to the emergency department if you have any worsening symptoms.  Apply bacitracin or Neosporin to your wounds, keep them clean.  Follow-up closely with your primary care physician.

## 2023-02-25 NOTE — ED Triage Notes (Signed)
Pt was taking a walk downtown and got tired, listed to the right, and fell. Per EMS pt has a laceration on the right side of his forehead, and an abrasion on his left thumb and right pinky finger. Pt had no LOC. Bleeding was controled by the fire department. Pt doesn't take any thinners, only a baby aspirin daily. Pt states he isn't steady on his feet and has multiple types of walkers and canes at home but was using none of the above today. Pt appears to be in NAD.

## 2023-02-27 ENCOUNTER — Ambulatory Visit (INDEPENDENT_AMBULATORY_CARE_PROVIDER_SITE_OTHER): Payer: 59

## 2023-02-27 VITALS — Ht 67.0 in | Wt 149.0 lb

## 2023-02-27 DIAGNOSIS — Z0000 Encounter for general adult medical examination without abnormal findings: Secondary | ICD-10-CM

## 2023-02-27 NOTE — Patient Instructions (Signed)
Zachary Brewer , Thank you for taking time to come for your Medicare Wellness Visit. I appreciate your ongoing commitment to your health goals. Please review the following plan we discussed and let me know if I can assist you in the future.   These are the goals we discussed:  Goals      DIET - EAT MORE FRUITS AND VEGETABLES     DIET - INCREASE WATER INTAKE     Patient Stated     02/05/2021, increase water intake        This is a list of the screening recommended for you and due dates:  Health Maintenance  Topic Date Due   COVID-19 Vaccine (5 - 2023-24 season) 10/22/2022   Flu Shot  05/28/2023   Medicare Annual Wellness Visit  02/27/2024   DTaP/Tdap/Td vaccine (2 - Td or Tdap) 02/24/2033   Pneumonia Vaccine  Completed   Zoster (Shingles) Vaccine  Completed   HPV Vaccine  Aged Out    Advanced directives: yes  Conditions/risks identified: none  Next appointment: Follow up in one year for your annual wellness visit. 03/04/24 @ 2:00 pm by phone  Preventive Care 65 Years and Older, Male  Preventive care refers to lifestyle choices and visits with your health care provider that can promote health and wellness. What does preventive care include? A yearly physical exam. This is also called an annual well check. Dental exams once or twice a year. Routine eye exams. Ask your health care provider how often you should have your eyes checked. Personal lifestyle choices, including: Daily care of your teeth and gums. Regular physical activity. Eating a healthy diet. Avoiding tobacco and drug use. Limiting alcohol use. Practicing safe sex. Taking low doses of aspirin every day. Taking vitamin and mineral supplements as recommended by your health care provider. What happens during an annual well check? The services and screenings done by your health care provider during your annual well check will depend on your age, overall health, lifestyle risk factors, and family history of  disease. Counseling  Your health care provider may ask you questions about your: Alcohol use. Tobacco use. Drug use. Emotional well-being. Home and relationship well-being. Sexual activity. Eating habits. History of falls. Memory and ability to understand (cognition). Work and work Astronomer. Screening  You may have the following tests or measurements: Height, weight, and BMI. Blood pressure. Lipid and cholesterol levels. These may be checked every 5 years, or more frequently if you are over 53 years old. Skin check. Lung cancer screening. You may have this screening every year starting at age 57 if you have a 30-pack-year history of smoking and currently smoke or have quit within the past 15 years. Fecal occult blood test (FOBT) of the stool. You may have this test every year starting at age 36. Flexible sigmoidoscopy or colonoscopy. You may have a sigmoidoscopy every 5 years or a colonoscopy every 10 years starting at age 73. Prostate cancer screening. Recommendations will vary depending on your family history and other risks. Hepatitis C blood test. Hepatitis B blood test. Sexually transmitted disease (STD) testing. Diabetes screening. This is done by checking your blood sugar (glucose) after you have not eaten for a while (fasting). You may have this done every 1-3 years. Abdominal aortic aneurysm (AAA) screening. You may need this if you are a current or former smoker. Osteoporosis. You may be screened starting at age 31 if you are at high risk. Talk with your health care provider about your  test results, treatment options, and if necessary, the need for more tests. Vaccines  Your health care provider may recommend certain vaccines, such as: Influenza vaccine. This is recommended every year. Tetanus, diphtheria, and acellular pertussis (Tdap, Td) vaccine. You may need a Td booster every 10 years. Zoster vaccine. You may need this after age 56. Pneumococcal 13-valent  conjugate (PCV13) vaccine. One dose is recommended after age 27. Pneumococcal polysaccharide (PPSV23) vaccine. One dose is recommended after age 59. Talk to your health care provider about which screenings and vaccines you need and how often you need them. This information is not intended to replace advice given to you by your health care provider. Make sure you discuss any questions you have with your health care provider. Document Released: 11/09/2015 Document Revised: 07/02/2016 Document Reviewed: 08/14/2015 Elsevier Interactive Patient Education  2017 [employer] Prevention in the Home Falls can cause injuries. They can happen to people of all ages. There are many things you can do to make your home safe and to help prevent falls. What can I do on the outside of my home? Regularly fix the edges of walkways and driveways and fix any cracks. Remove anything that might make you trip as you walk through a door, such as a raised step or threshold. Trim any bushes or trees on the path to your home. Use bright outdoor lighting. Clear any walking paths of anything that might make someone trip, such as rocks or tools. Regularly check to see if handrails are loose or broken. Make sure that both sides of any steps have handrails. Any raised decks and porches should have guardrails on the edges. Have any leaves, snow, or ice cleared regularly. Use sand or salt on walking paths during winter. Clean up any spills in your garage right away. This includes oil or grease spills. What can I do in the bathroom? Use night lights. Install grab bars by the toilet and in the tub and shower. Do not use towel bars as grab bars. Use non-skid mats or decals in the tub or shower. If you need to sit down in the shower, use a plastic, non-slip stool. Keep the floor dry. Clean up any water that spills on the floor as soon as it happens. Remove soap buildup in the tub or shower regularly. Attach bath mats  securely with double-sided non-slip rug tape. Do not have throw rugs and other things on the floor that can make you trip. What can I do in the bedroom? Use night lights. Make sure that you have a light by your bed that is easy to reach. Do not use any sheets or blankets that are too big for your bed. They should not hang down onto the floor. Have a firm chair that has side arms. You can use this for support while you get dressed. Do not have throw rugs and other things on the floor that can make you trip. What can I do in the kitchen? Clean up any spills right away. Avoid walking on wet floors. Keep items that you use a lot in easy-to-reach places. If you need to reach something above you, use a strong step stool that has a grab bar. Keep electrical cords out of the way. Do not use floor polish or wax that makes floors slippery. If you must use wax, use non-skid floor wax. Do not have throw rugs and other things on the floor that can make you trip. What can I do with my  stairs? Do not leave any items on the stairs. Make sure that there are handrails on both sides of the stairs and use them. Fix handrails that are broken or loose. Make sure that handrails are as long as the stairways. Check any carpeting to make sure that it is firmly attached to the stairs. Fix any carpet that is loose or worn. Avoid having throw rugs at the top or bottom of the stairs. If you do have throw rugs, attach them to the floor with carpet tape. Make sure that you have a light switch at the top of the stairs and the bottom of the stairs. If you do not have them, ask someone to add them for you. What else can I do to help prevent falls? Wear shoes that: Do not have high heels. Have rubber bottoms. Are comfortable and fit you well. Are closed at the toe. Do not wear sandals. If you use a stepladder: Make sure that it is fully opened. Do not climb a closed stepladder. Make sure that both sides of the stepladder  are locked into place. Ask someone to hold it for you, if possible. Clearly mark and make sure that you can see: Any grab bars or handrails. First and last steps. Where the edge of each step is. Use tools that help you move around (mobility aids) if they are needed. These include: Canes. Walkers. Scooters. Crutches. Turn on the lights when you go into a dark area. Replace any light bulbs as soon as they burn out. Set up your furniture so you have a clear path. Avoid moving your furniture around. If any of your floors are uneven, fix them. If there are any pets around you, be aware of where they are. Review your medicines with your doctor. Some medicines can make you feel dizzy. This can increase your chance of falling. Ask your doctor what other things that you can do to help prevent falls. This information is not intended to replace advice given to you by your health care provider. Make sure you discuss any questions you have with your health care provider. Document Released: 08/09/2009 Document Revised: 03/20/2016 Document Reviewed: 11/17/2014 Elsevier Interactive Patient Education  2017 [employer].

## 2023-02-27 NOTE — Progress Notes (Signed)
I connected with  Zachary Brewer on 02/27/23 by a audio enabled telemedicine application and verified that I am speaking with the correct person using two identifiers.  Patient Location: Home  Provider Location: Office/Clinic  I discussed the limitations of evaluation and management by telemedicine. The patient expressed understanding and agreed to proceed.  Subjective:   Zachary Brewer is a 82 y.o. male who presents for Medicare Annual/Subsequent preventive examination.  Review of Systems     Cardiac Risk Factors include: advanced age (>33men, >80 women);male gender;hypertension     Objective:    Today's Vitals   02/27/23 1435  PainSc: 0-No pain   There is no height or weight on file to calculate BMI.     02/27/2023    2:42 PM 02/25/2023   11:59 AM 06/20/2022    9:03 PM 06/05/2022    6:18 AM 05/28/2022    2:21 PM 02/05/2021   11:49 AM 07/12/2019   10:10 AM  Advanced Directives  Does Patient Have a Medical Advance Directive? Yes No Yes Yes Yes Yes Yes  Type of Estate agent of [place];Living will  Healthcare Power of [place];Living will Living will;Healthcare Power of [employer] Power of [place];Living will Living will;Healthcare Power of Attorney  Does patient want to make changes to medical advance directive? Yes (Inpatient - patient defers changing a medical advance directive and declines information at this time)        Copy of Healthcare Power of Attorney in Chart? Yes - validated most recent copy scanned in chart (See row information)   Yes - validated most recent copy scanned in chart (See row information)  No - copy requested No - copy requested  Would patient like information on creating a medical advance directive? Yes (Inpatient - patient defers creating a medical advance directive and declines information at this time)    No - Patient declined      Current Medications (verified) Outpatient Encounter Medications as of 02/27/2023  Medication  Sig   Accu-Chek FastClix Lancets MISC Use to check blood sugar up to 1 x weekly   aspirin EC 81 MG tablet Take 81 mg by mouth daily.   azelastine (ASTELIN) 0.1 % nasal spray Place 2 sprays into both nostrils 2 (two) times daily. Use in each nostril as directed   Blood Glucose Monitoring Suppl (ACCU-CHEK AVIVA PLUS) w/Device KIT Use glucometer to check blood sugar 1 x weekly as advised   Cholecalciferol (VITAMIN D3) 1000 units CAPS Take 1,000 Units by mouth daily.   enalapril (VASOTEC) 20 MG tablet TAKE 1 TABLET(20 MG) BY MOUTH TWICE DAILY   finasteride (PROSCAR) 5 MG tablet TAKE 1 TABLET(5 MG) BY MOUTH DAILY   Glucosamine-Chondroit-Vit C-Mn (GLUCOSAMINE 1500 COMPLEX PO) Take 1,500 mg by mouth daily.   glucose blood (ACCU-CHEK AVIVA PLUS) test strip Use to check once per day   montelukast (SINGULAIR) 10 MG tablet Take 1 tablet (10 mg total) by mouth at bedtime.   Multiple Vitamin (MULTIVITAMIN) tablet Take 1 tablet by mouth daily. Reported on 11/21/2015   omeprazole (PRILOSEC) 20 MG capsule Take 1 capsule (20 mg total) by mouth 2 (two) times daily before a meal.   psyllium (METAMUCIL) 58.6 % powder Take 1 packet by mouth daily.   tamsulosin (FLOMAX) 0.4 MG CAPS capsule Take 2 capsules (0.8 mg total) by mouth daily after breakfast.   No facility-administered encounter medications on file as of 02/27/2023.    Allergies (verified) Patient has no known allergies.  History: Past Medical History:  Diagnosis Date   Allergy    Arthritis 05/30/2015   Gout - Left knee and ankle   Cervical pain 05/30/2015   GERD (gastroesophageal reflux disease)    Hyperlipidemia    Hypertension    Irregular heart rhythm    Lipoma of neck 05/30/2015   Wears dentures    full upper and lower (doesn't wear lower)   Wears hearing aid    bilateral   Past Surgical History:  Procedure Laterality Date   CARPAL TUNNEL RELEASE Right 06/05/2022   Procedure: CARPAL TUNNEL RELEASE;  Surgeon: Juanell Fairly, MD;   Location: [facility];  Service: Orthopedics;  Laterality: Right;   CATARACT EXTRACTION W/PHACO Right 06/11/2016   Procedure: CATARACT EXTRACTION PHACO AND INTRAOCULAR LENS PLACEMENT (IOC);  Surgeon: Lockie Mola, MD;  Location: [facility];  Service: Ophthalmology;  Laterality: Right;   CATARACT EXTRACTION W/PHACO Left 07/09/2016   Procedure: CATARACT EXTRACTION PHACO AND INTRAOCULAR LENS PLACEMENT (IOC);  Surgeon: Lockie Mola, MD;  Location: [facility];  Service: Ophthalmology;  Laterality: Left;  MALYUGIN   ESOPHAGOGASTRODUODENOSCOPY (EGD) WITH PROPOFOL N/A 06/20/2015   Procedure: ESOPHAGOGASTRODUODENOSCOPY (EGD) WITH PROPOFOL with dialation;  Surgeon: Midge Minium, MD;  Location: [facility];  Service: Endoscopy;  Laterality: N/A;   FLEXIBLE BRONCHOSCOPY N/A 06/22/2015   Procedure: FLEXIBLE BRONCHOSCOPY;  Surgeon: Yevonne Pax, MD;  Location: [facility];  Service: Pulmonary;  Laterality: N/A;   GASTRIC OUTLET OBSTRUCTION RELEASE  2013   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   UPPER GI ENDOSCOPY     Family History  Problem Relation Age of Onset   Diabetes Mother    Alzheimer's disease Mother    Heart disease Father    Diabetes Sister    Diabetes Brother    Stroke Brother    Mental illness Brother    Hypertension Paternal Grandfather    Social History   Socioeconomic History   Marital status: Widowed    Spouse name: Not on file   Number of children: 3   Years of education: Not on file   Highest education level: 6th grade  Occupational History   Occupation: retired  Tobacco Use   Smoking status: Never   Smokeless tobacco: Never  Vaping Use   Vaping Use: Never used  Substance and Sexual Activity   Alcohol use: No   Drug use: No   Sexual activity: Not on file  Other Topics Concern   Not on file  Social History Narrative   Not on file   Social Determinants of Health   Financial Resource Strain: Low Risk  (02/27/2023)   Overall Financial Resource  Strain (CARDIA)    Difficulty of Paying Living Expenses: Not hard at all  Food Insecurity: No Food Insecurity (02/27/2023)   Hunger Vital Sign    Worried About Running Out of Food in the Last Year: Never true    Ran Out of Food in the Last Year: Never true  Transportation Needs: No Transportation Needs (02/27/2023)   PRAPARE - Administrator, Civil Service (Medical): No    Lack of Transportation (Non-Medical): No  Physical Activity: Insufficiently Active (02/27/2023)   Exercise Vital Sign    Days of Exercise per Week: 3 days    Minutes of Exercise per Session: 30 min  Stress: No Stress Concern Present (02/27/2023)   [employer] of Occupational Health - Occupational Stress Questionnaire    Feeling of Stress : Not at all  Social Connections: Moderately Isolated (02/27/2023)  Social Advertising account executive [NHANES]    Frequency of Communication with Friends and Family: More than three times a week    Frequency of Social Gatherings with Friends and Family: Once a week    Attends Religious Services: More than 4 times per year    Active Member of [employer] or Organizations: No    Attends Banker Meetings: Never    Marital Status: Widowed    Tobacco Counseling Counseling given: Not Answered   Clinical Intake:  Pre-visit preparation completed: Yes  Pain : No/denies pain Pain Score: 0-No pain     Nutritional Risks: None Diabetes: No  How often do you need to have someone help you when you read instructions, pamphlets, or other written materials from your doctor or pharmacy?: 1 - Never  Diabetic?no  Interpreter Needed?: No  Information entered by :: Kennedy Bucker, LPN   Activities of Daily Living    02/27/2023    2:45 PM 05/28/2022    2:20 PM  In your present state of health, do you have any difficulty performing the following activities:  Hearing? 1 1  Vision? 0 0  Difficulty concentrating or making decisions? 0 0  Walking or climbing stairs? 1  0  Dressing or bathing? 0 1  Doing errands, shopping? 0   Preparing Food and eating ? N   Using the Toilet? N   In the past six months, have you accidently leaked urine? N   Do you have problems with loss of bowel control? N   Managing your Medications? N   Managing your Finances? N   Housekeeping or managing your Housekeeping? N     Patient Care Team: Smitty Cords, DO as PCP - General (Family Medicine) Ardith Dark, MD as Rounding Team (Internal Medicine)  Indicate any recent Medical Services you may have received from other than [facility] providers in the past year (date may be approximate).     Assessment:   This is a routine wellness examination for Hashim.  Hearing/Vision screen Hearing Screening - Comments:: Has aids Vision Screening - Comments:: Wears glasses- Dr.King   Dietary issues and exercise activities discussed: Current Exercise Habits: Home exercise routine, Type of exercise: walking, Time (Minutes): 30, Frequency (Times/Week): 3, Weekly Exercise (Minutes/Week): 90, Intensity: Mild   Goals Addressed             This Visit's Progress    DIET - EAT MORE FRUITS AND VEGETABLES         Depression Screen    02/27/2023    2:40 PM 04/18/2022    3:40 PM 02/20/2022    8:13 AM 06/20/2021   11:32 AM 02/05/2021   11:51 AM 10/09/2020    8:08 AM 04/09/2020    8:03 AM  PHQ 2/9 Scores  PHQ - 2 Score 0 0 0 0 0 0 0  PHQ- 9 Score  0         Fall Risk    02/27/2023    2:44 PM 04/18/2022    3:40 PM 02/20/2022    8:22 AM 04/16/2021    8:01 AM 02/05/2021   11:51 AM  Fall Risk   Falls in the past year? 1 0 0 0 0  Number falls in past yr: 0 0 0 0   Injury with Fall? 1 0 0 0   Risk for fall due to : History of fall(s) No Fall Risks No Fall Risks  Medication side effect  Follow up Falls evaluation completed;Falls prevention discussed  Falls evaluation completed Falls evaluation completed Falls evaluation completed Falls evaluation completed;Education  provided;Falls prevention discussed    FALL RISK PREVENTION PERTAINING TO THE HOME:  Any stairs in or around the home? No  If so, are there any without handrails? No  Home free of loose throw rugs in walkways, pet beds, electrical cords, etc? Yes  Adequate lighting in your home to reduce risk of falls? Yes   ASSISTIVE DEVICES UTILIZED TO PREVENT FALLS:  Life alert? No  Use of a cane, walker or w/c? Yes - cane Grab bars in the bathroom? No  Shower chair or bench in shower? No  Elevated toilet seat or a handicapped toilet? No   Cognitive Function:        02/27/2023    2:48 PM 02/20/2022    8:23 AM 02/05/2021   11:58 AM 07/12/2019   10:20 AM 05/18/2018    8:26 AM  6CIT Screen  What Year? 0 points 0 points 0 points 0 points 0 points  What month? 0 points 0 points 0 points 0 points 0 points  What time? 0 points 0 points 0 points 0 points 0 points  Count back from 20 0 points 0 points 0 points 0 points 0 points  Months in reverse 2 points 0 points 4 points 0 points 0 points  Repeat phrase 4 points 10 points 10 points 4 points 2 points  Total Score 6 points 10 points 14 points 4 points 2 points    Immunizations Immunization History  Administered Date(s) Administered   Fluad Quad(high Dose 65+) 07/12/2019   Influenza Inj Mdck Quad With Preservative 08/26/2017   Influenza, High Dose Seasonal PF 07/24/2015, 09/09/2016, 07/29/2021, 07/12/2022   Influenza-Unspecified 08/26/2017, 08/06/2018, 06/27/2020   Moderna Covid-19 Vaccine Bivalent Booster 12yrs & up 08/12/2021   Moderna SARS-COV2 Booster Vaccination 03/13/2021, 08/27/2022   Moderna Sars-Covid-2 Vaccination 11/08/2019, 12/06/2019, 08/24/2020   PPD Test 08/03/2015   Pneumococcal Conjugate-13 07/28/2014   Pneumococcal Polysaccharide-23 02/20/2022   Tdap 02/25/2023   Zoster Recombinat (Shingrix) 02/24/2022, 04/26/2022    TDAP status: Up to date  Flu Vaccine status: Up to date  Pneumococcal vaccine status: Up to  date  Covid-19 vaccine status: Completed vaccines  Qualifies for Shingles Vaccine? Yes   Zostavax completed No   Shingrix Completed?: Yes  Screening Tests Health Maintenance  Topic Date Due   COVID-19 Vaccine (5 - 2023-24 season) 10/22/2022   INFLUENZA VACCINE  05/28/2023   Medicare Annual Wellness (AWV)  02/27/2024   DTaP/Tdap/Td (2 - Td or Tdap) 02/24/2033   Pneumonia Vaccine 23+ Years old  Completed   Zoster Vaccines- Shingrix  Completed   HPV VACCINES  Aged Out    Health Maintenance  Health Maintenance Due  Topic Date Due   COVID-19 Vaccine (5 - 2023-24 season) 10/22/2022    Colorectal cancer screening: No longer required.   Lung Cancer Screening: (Low Dose CT Chest recommended if Age 56-80 years, 30 pack-year currently smoking OR have quit w/in 15years.) does not qualify.    Additional Screening:  Hepatitis C Screening: does not qualify; Completed no  Vision Screening: Recommended annual ophthalmology exams for early detection of glaucoma and other disorders of the eye. Is the patient up to date with their annual eye exam?  Yes  Who is the provider or what is the name of the office in which the patient attends annual eye exams? Dr.King If pt is not established with a provider, would they like to be referred to a provider to  establish care? No .   Dental Screening: Recommended annual dental exams for proper oral hygiene  Community Resource Referral / Chronic Care Management: CRR required this visit?  No   CCM required this visit?  No      Plan:     I have personally reviewed and noted the following in the patient's chart:   Medical and social history Use of alcohol, tobacco or illicit drugs  Current medications and supplements including opioid prescriptions. Patient is not currently taking opioid prescriptions. Functional ability and status Nutritional status Physical activity Advanced directives List of other physicians Hospitalizations, surgeries,  and ER visits in previous 12 months Vitals Screenings to include cognitive, depression, and falls Referrals and appointments  In addition, I have reviewed and discussed with patient certain preventive protocols, quality metrics, and best practice recommendations. A written personalized care plan for preventive services as well as general preventive health recommendations were provided to patient.     Hal Hope, LPN   07/01/2840   Nurse Notes: none

## 2023-03-18 DIAGNOSIS — H35033 Hypertensive retinopathy, bilateral: Secondary | ICD-10-CM | POA: Diagnosis not present

## 2023-03-20 ENCOUNTER — Other Ambulatory Visit: Payer: Self-pay | Admitting: Family Medicine

## 2023-03-20 DIAGNOSIS — R739 Hyperglycemia, unspecified: Secondary | ICD-10-CM

## 2023-03-20 NOTE — Telephone Encounter (Signed)
Requested Prescriptions  Pending Prescriptions Disp Refills   glucose blood (ACCU-CHEK AVIVA PLUS) test strip [Pharmacy Med Name: ACCU-CHEK AVIVA PLUS STRIPS 100S] 100 strip 2    Sig: USE TO CHECK BLOOD SUGAR ONCE DAILY     Endocrinology: Diabetes - Testing Supplies Passed - 03/20/2023  9:19 AM      Passed - Valid encounter within last 12 months    Recent Outpatient Visits           4 months ago Annual physical exam   [place] [facility] Smitty Cords, DO   11 months ago Essential (primary) hypertension   [place] [facility] Smitty Cords, DO   1 year ago Annual physical exam   [place] [facility] Smitty Cords, DO   1 year ago Essential (primary) hypertension   [place] [facility] Smitty Cords, DO   2 years ago Essential (primary) hypertension   [place] [facility] Althea Charon, Netta Neat, DO       Future Appointments             In 1 month Althea Charon, Netta Neat, DO  [facility], [facility]

## 2023-03-28 ENCOUNTER — Other Ambulatory Visit: Payer: Self-pay | Admitting: Family Medicine

## 2023-03-28 DIAGNOSIS — N138 Other obstructive and reflux uropathy: Secondary | ICD-10-CM

## 2023-03-30 ENCOUNTER — Other Ambulatory Visit: Payer: Self-pay | Admitting: Family Medicine

## 2023-03-30 DIAGNOSIS — K2100 Gastro-esophageal reflux disease with esophagitis, without bleeding: Secondary | ICD-10-CM

## 2023-03-30 NOTE — Telephone Encounter (Signed)
Requested Prescriptions  Pending Prescriptions Disp Refills   tamsulosin (FLOMAX) 0.4 MG CAPS capsule [Pharmacy Med Name: TAMSULOSIN 0.4MG  CAPSULES] 180 capsule 2    Sig: TAKE 2 CAPSULES(0.8 MG) BY MOUTH DAILY AFTER BREAKFAST     Urology: Alpha-Adrenergic Blocker Passed - 03/28/2023  9:05 AM      Passed - PSA in normal range and within 360 days    PSA  Date Value Ref Range Status  10/16/2022 0.32 < OR = 4.00 ng/mL Final    Comment:    The total PSA value from this assay system is  standardized against the WHO standard. The test  result will be approximately 20% lower when compared  to the equimolar-standardized total PSA (Beckman  Coulter). Comparison of serial PSA results should be  interpreted with this fact in mind. . This test was performed using the Siemens  chemiluminescent method. Values obtained from  different assay methods cannot be used interchangeably. PSA levels, regardless of value, should not be interpreted as absolute evidence of the presence or absence of disease.          Passed - Last BP in normal range    BP Readings from Last 1 Encounters:  02/25/23 139/63         Passed - Valid encounter within last 12 months    Recent Outpatient Visits           5 months ago Annual physical exam   [place] [facility] Smitty Cords, DO   11 months ago Essential (primary) hypertension   [place] [facility] Smitty Cords, DO   1 year ago Annual physical exam   [place] [facility] Smitty Cords, DO   1 year ago Essential (primary) hypertension   [place] [facility] Smitty Cords, DO   2 years ago Essential (primary) hypertension   [place] [facility] Althea Charon, Netta Neat, DO       Future Appointments             In 3 weeks Althea Charon, Netta Neat, DO  [facility], [facility]

## 2023-03-31 NOTE — Telephone Encounter (Signed)
Requested Prescriptions  Pending Prescriptions Disp Refills   omeprazole (PRILOSEC) 20 MG capsule [Pharmacy Med Name: OMEPRAZOLE 20MG  CAPSULES] 180 capsule 1    Sig: TAKE 1 CAPSULE(20 MG) BY MOUTH TWICE DAILY BEFORE A MEAL     Gastroenterology: Proton Pump Inhibitors Passed - 03/30/2023  2:46 PM      Passed - Valid encounter within last 12 months    Recent Outpatient Visits           5 months ago Annual physical exam   [place] [facility] Smitty Cords, DO   11 months ago Essential (primary) hypertension   [place] [facility] Althea Charon, Netta Neat, DO   1 year ago Annual physical exam   [place] [facility] Smitty Cords, DO   1 year ago Essential (primary) hypertension   [place] [facility] Smitty Cords, DO   2 years ago Essential (primary) hypertension   [place] [facility] Althea Charon, Netta Neat, DO       Future Appointments             In 3 weeks Althea Charon, Netta Neat, DO [facility], [facility]

## 2023-04-24 ENCOUNTER — Ambulatory Visit (INDEPENDENT_AMBULATORY_CARE_PROVIDER_SITE_OTHER): Payer: 59 | Admitting: Family Medicine

## 2023-04-24 ENCOUNTER — Encounter: Payer: Self-pay | Admitting: Family Medicine

## 2023-04-24 VITALS — BP 136/82 | HR 77 | Temp 96.4°F | Wt 151.0 lb

## 2023-04-24 DIAGNOSIS — J3089 Other allergic rhinitis: Secondary | ICD-10-CM | POA: Diagnosis not present

## 2023-04-24 DIAGNOSIS — I700 Atherosclerosis of aorta: Secondary | ICD-10-CM | POA: Diagnosis not present

## 2023-04-24 DIAGNOSIS — N138 Other obstructive and reflux uropathy: Secondary | ICD-10-CM | POA: Diagnosis not present

## 2023-04-24 DIAGNOSIS — I10 Essential (primary) hypertension: Secondary | ICD-10-CM | POA: Diagnosis not present

## 2023-04-24 DIAGNOSIS — E782 Mixed hyperlipidemia: Secondary | ICD-10-CM

## 2023-04-24 DIAGNOSIS — N401 Enlarged prostate with lower urinary tract symptoms: Secondary | ICD-10-CM | POA: Diagnosis not present

## 2023-04-24 MED ORDER — MONTELUKAST SODIUM 10 MG PO TABS: 10.0000 mg | ORAL_TABLET | Freq: Every day | ORAL | 3 refills | Status: DC

## 2023-04-24 MED ORDER — ENALAPRIL MALEATE 20 MG PO TABS: 20.0000 mg | ORAL_TABLET | Freq: Two times a day (BID) | ORAL | 3 refills | Status: DC

## 2023-04-24 NOTE — Assessment & Plan Note (Signed)
Controlled HYPERTENSION - Home BP readings normal, detailed log today  No known complications    Plan:  1. Continue Enalapril 20mg  BID 2. Encourage improved lifestyle - low sodium diet, regular exercise 3. Continue monitor BP outside office, bring readings to next visit, if persistently >140/90 or new symptoms notify office sooner

## 2023-04-24 NOTE — Assessment & Plan Note (Signed)
Controlled cholesterol on lifestyle Last lipid panel 09/2022 Calculated ASCVD 10 yr risk score elevated due to age, HTN  Plan: 1. Briefly reviewed ASCVD risk - consider low dose statin for future risk reduction, but agree to defer for now, patient not interested in starting new med 2. Continue ASA 81mg for primary ASCVD risk reduction 3. Encourage improved lifestyle - low carb/cholesterol, reduce portion size, continue improving regular exercise 4. Follow-up yearly lipids 

## 2023-04-24 NOTE — Assessment & Plan Note (Signed)
On prior imaging CXR On Aspirin 81mg 

## 2023-04-24 NOTE — Progress Notes (Unsigned)
Subjective:    Patient ID: Zachary Brewer, male    DOB: 1941-01-24, 82 y.o.   MRN: [id number]  Zachary Brewer is a 82 y.o. male presenting on 04/24/2023 for Hypertension   HPI  CHRONIC HTN: Reports no new concerns. Doing well. BP readings have been controlled. Home BP mostly controlled, checks regularly. Has had some normal readings 123/73, HR 80 last checked Current Meds - Enalapril 20mg  BID Reports good compliance, took meds today. Tolerating well, w/o complaints. Rare dizziness episode Denies CP, dyspnea, HA, edema, dizziness / lightheadedness   History of Elevated Blood Sugar: A1c up to 5.5, from 5.0 to 5.3 range Fam history DM CBGs: Avg 100s, Low >85 High < 120. Checks CBGs x 1 weekly fasting - has detailed log Meds: None (never) Currently on ACEi Lifestyle: - Diet: Healthy diet. No significant change - Exercise: regular activity some walking Denies hypoglycemia   History of Gastric Outlet Obstruction (S/p Roux-en-Y surgery) / History of weight loss / GERD: - Briefly reviews prior history of s/p gastric surgery in past see prior note for background information - Tolerating diet, has dentures but problem with denture adhesive, see below, stays well hydrated - Taking Omeprazole 20mg  BID for GERD   BPH Reports chronic problem. Doing well on Flomax 0.4mg  x2 daily and Finasteride 5mg . 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 Now he is following, [facility] (Dr Tresa Moore), controlled on regular Tylenol dosing - He has been on prednisone PRN in past. 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   Aortic Atherosclerosis On prior CXR Imaging On Aspirin 81mg    Health Maintenance: Updated Shingrix x 2 doses completed 02/2022, 04/2022 COVID Booster updated 08/2022     02/27/2023    2:40 PM 04/18/2022    3:40 PM 02/20/2022    8:13 AM  Depression screen PHQ 2/9  Decreased Interest 0 0 0  Down, Depressed, Hopeless 0 0 0  PHQ - 2 Score 0 0 0  Altered sleeping  0   Tired, decreased energy  0   Change in appetite  0   Feeling bad or failure about yourself   0   Trouble concentrating  0   Moving slowly or fidgety/restless  0   Suicidal thoughts  0   PHQ-9 Score  0   Difficult doing work/chores  Not difficult at all     Social History   Tobacco Use   Smoking status: Never   Smokeless tobacco: Never  Vaping Use   Vaping Use: Never used  Substance Use Topics   Alcohol use: No   Drug use: No    Review of Systems Per HPI unless specifically indicated above     Objective:    BP 136/82 (BP Location: Right Arm, Patient Position: Sitting, Cuff Size: Normal)   Pulse 77   Temp (!) 96.4 F (35.8 C) (Temporal)   Wt 151 lb (68.5 kg)   SpO2 95%   BMI 23.65 kg/m   Wt Readings from Last 3 Encounters:  04/24/23 151 lb (68.5 kg)  02/27/23 149 lb (  67.6 kg)  02/25/23 143 lb (64.9 kg)    Physical Exam Vitals and nursing note reviewed.  Constitutional:      General: He is not in acute distress.    Appearance: He is well-developed. He is not diaphoretic.     Comments: Well-appearing, comfortable, cooperative  HENT:     Head: Normocephalic and atraumatic.  Eyes:     General:        Right eye: No discharge.        Left eye: No discharge.     Conjunctiva/sclera: Conjunctivae normal.  Neck:     Thyroid: No thyromegaly.     Vascular: No carotid bruit.  Cardiovascular:     Rate and Rhythm: Normal rate and regular rhythm.     Pulses: Normal pulses.     Heart sounds: Normal heart sounds. No murmur  heard. Pulmonary:     Effort: Pulmonary effort is normal. No respiratory distress.     Breath sounds: Normal breath sounds. No wheezing or rales.  Musculoskeletal:        General: Normal range of motion.     Cervical back: Normal range of motion and neck supple.     Right lower leg: No edema.     Left lower leg: No edema.  Lymphadenopathy:     Cervical: No cervical adenopathy.  Skin:    General: Skin is warm and dry.     Findings: No erythema or rash.  Neurological:     Mental Status: He is alert and oriented to person, place, and time. Mental status is at baseline.  Psychiatric:        Behavior: Behavior normal.     Comments: Well groomed, good eye contact, normal speech and thoughts      Results for orders placed or performed in visit on 10/15/22  PSA  Result Value Ref Range   PSA 0.32 < OR = 4.00 ng/mL  Hemoglobin A1c  Result Value Ref Range   Hgb A1c MFr Bld 5.5 <5.7 % of total Hgb   Mean Plasma Glucose 111 mg/dL   eAG (mmol/L) 6.2 mmol/L  CBC with Differential/Platelet  Result Value Ref Range   WBC 6.4 3.8 - 10.8 Thousand/uL   RBC 4.99 4.20 - 5.80 Million/uL   Hemoglobin 16.3 13.2 - 17.1 g/dL   HCT 16.1 09.6 - 04.5 %   MCV 94.8 80.0 - 100.0 fL   MCH 32.7 27.0 - 33.0 pg   MCHC 34.5 32.0 - 36.0 g/dL   RDW 40.9 81.1 - 91.4 %   Platelets 159 140 - 400 Thousand/uL   MPV 11.1 7.5 - 12.5 fL   Neutro Abs 4,915 1,500 - 7,800 cells/uL   Lymphs Abs 1,005 850 - 3,900 cells/uL   Absolute Monocytes 429 200 - 950 cells/uL   Eosinophils Absolute 32 15 - 500 cells/uL   Basophils Absolute 19 0 - 200 cells/uL   Neutrophils Relative % 76.8 %   Total Lymphocyte 15.7 %   Monocytes Relative 6.7 %   Eosinophils Relative 0.5 %   Basophils Relative 0.3 %  Lipid panel  Result Value Ref Range   Cholesterol 140 <200 mg/dL   HDL 42 > OR = 40 mg/dL   Triglycerides 782 <956 mg/dL   LDL Cholesterol (Calc) 79 mg/dL (calc)   Total CHOL/HDL Ratio 3.3 <5.0 (calc)   Non-HDL Cholesterol  (Calc) 98 <130 mg/dL (calc)  COMPLETE METABOLIC PANEL WITH GFR  Result Value Ref Range   Glucose, Bld 108 (H) 65 -  99 mg/dL   BUN 14 7 - 25 mg/dL   Creat 1.61 0.96 - 0.45 mg/dL   eGFR 86 > OR = 60 WU/JWJ/1.91Y7   BUN/Creatinine Ratio SEE NOTE: 6 - 22 (calc)   Sodium 144 135 - 146 mmol/L   Potassium 4.4 3.5 - 5.3 mmol/L   Chloride 104 98 - 110 mmol/L   CO2 32 20 - 32 mmol/L   Calcium 9.4 8.6 - 10.3 mg/dL   Total Protein 6.1 6.1 - 8.1 g/dL   Albumin 4.0 3.6 - 5.1 g/dL   Globulin 2.1 1.9 - 3.7 g/dL (calc)   AG Ratio 1.9 1.0 - 2.5 (calc)   Total Bilirubin 1.0 0.2 - 1.2 mg/dL   Alkaline phosphatase (APISO) 94 35 - 144 U/L   AST 20 10 - 35 U/L   ALT 15 9 - 46 U/L      Assessment & Plan:   Problem List Items Addressed This Visit     Allergic rhinitis    Continue OTC Allegra Continue Monelukast 10mg  nightly - re ordered. Also on Azelastine nasal spray Using OTC nasal saline      Relevant Medications   montelukast (SINGULAIR) 10 MG tablet   Aortic atherosclerosis (HCC)    On prior imaging CXR On Aspirin 81mg       Relevant Medications   enalapril (VASOTEC) 20 MG tablet   BPH with obstruction/lower urinary tract symptoms    Improved BPH on Finasteride and dose adjust Tamsulosin History Variety of Severe LUTS and nocturia primarily bothering him - Last PSA 0.36 (09/2021) with anticipated drop in PSA while on Finasteride - Last DRE uncertain - No known personal/family history of prostate CA  Plan: 1. Tamsulosin 0.4mg  x 2 daily 2. Finasteride 5mg  daily  Follow-up as planned future Urologist if needed      Essential (primary) hypertension - Primary    Controlled HYPERTENSION - Home BP readings normal, detailed log today  No known complications    Plan:  1. Continue Enalapril 20mg  BID 2. Encourage improved lifestyle - low sodium diet, regular exercise 3. Continue monitor BP outside office, bring readings to next visit, if persistently >140/90 or new symptoms notify  office sooner      Relevant Medications   enalapril (VASOTEC) 20 MG tablet   Hyperlipidemia    Controlled cholesterol on lifestyle Last lipid panel 09/2022 Calculated ASCVD 10 yr risk score elevated due to age, HTN  Plan: 1. Briefly reviewed ASCVD risk - consider low dose statin for future risk reduction, but agree to defer for now, patient not interested in starting new med 2. Continue ASA 81mg  for primary ASCVD risk reduction 3. Encourage improved lifestyle - low carb/cholesterol, reduce portion size, continue improving regular exercise 4. Follow-up yearly lipids      Relevant Medications   enalapril (VASOTEC) 20 MG tablet    Meds ordered this encounter  Medications   enalapril (VASOTEC) 20 MG tablet    Sig: Take 1 tablet (20 mg total) by mouth 2 (two) times daily.    Dispense:  180 tablet    Refill:  3   montelukast (SINGULAIR) 10 MG tablet    Sig: Take 1 tablet (10 mg total) by mouth at bedtime.    Dispense:  90 tablet    Refill:  3    Follow up plan: Return in about 6 months (around 10/24/2023) for 6 month fasting lab only then 1 week later Annual Physical.  Future labs ordered for ***   Saralyn Pilar, DO  [facility] 04/24/2023, 8:37 AM

## 2023-04-24 NOTE — Patient Instructions (Addendum)
Thank you for coming to the office today.  Recommend Prevagen supplement for memory / mind. It is OTC and safe to take. Purchase at the pharmacy.  Stay well hydrated  Keep monitoring BP.  Refilled medications.  DUE for FASTING BLOOD WORK (no food or drink after midnight before the lab appointment, only water or coffee without cream/sugar on the morning of)  SCHEDULE "Lab Only" visit in the morning at the clinic for lab draw in 6 MONTHS   - Make sure Lab Only appointment is at about 1 week before your next appointment, so that results will be available  For Lab Results, once available within 2-3 days of blood draw, you can can log in to MyChart online to view your results and a brief explanation. Also, we can discuss results at next follow-up visit.   Please schedule a Follow-up Appointment to: Return in about 6 months (around 10/24/2023) for 6 month fasting lab only then 1 week later Annual Physical.  If you have any other questions or concerns, please feel free to call the office or send a message through MyChart. You may also schedule an earlier appointment if necessary.  Additionally, you may be receiving a survey about your experience at our office within a few days to 1 week by e-mail or mail. We value your feedback.  Saralyn Pilar, DO [facility], [place]

## 2023-04-24 NOTE — Assessment & Plan Note (Signed)
Continue OTC Allegra Continue Monelukast 10mg  nightly - re ordered. Also on Azelastine nasal spray Using OTC nasal saline

## 2023-04-24 NOTE — Assessment & Plan Note (Signed)
Improved BPH on Finasteride and dose adjust Tamsulosin History Variety of Severe LUTS and nocturia primarily bothering him - Last PSA 0.36 (09/2021) with anticipated drop in PSA while on Finasteride - Last DRE uncertain - No known personal/family history of prostate CA  Plan: 1. Tamsulosin 0.4mg  x 2 daily 2. Finasteride 5mg  daily  Follow-up as planned future Urologist if needed

## 2023-04-25 ENCOUNTER — Other Ambulatory Visit: Payer: Self-pay | Admitting: Family Medicine

## 2023-04-25 ENCOUNTER — Encounter: Payer: Self-pay | Admitting: Family Medicine

## 2023-04-25 DIAGNOSIS — E782 Mixed hyperlipidemia: Secondary | ICD-10-CM

## 2023-04-25 DIAGNOSIS — I10 Essential (primary) hypertension: Secondary | ICD-10-CM

## 2023-04-25 DIAGNOSIS — N138 Other obstructive and reflux uropathy: Secondary | ICD-10-CM

## 2023-04-25 DIAGNOSIS — M1120 Other chondrocalcinosis, unspecified site: Secondary | ICD-10-CM

## 2023-04-25 DIAGNOSIS — R739 Hyperglycemia, unspecified: Secondary | ICD-10-CM

## 2023-04-25 DIAGNOSIS — Z0000 Encounter for general adult medical examination without abnormal findings: Secondary | ICD-10-CM

## 2023-04-27 ENCOUNTER — Other Ambulatory Visit: Payer: Self-pay | Admitting: Family Medicine

## 2023-04-27 DIAGNOSIS — I10 Essential (primary) hypertension: Secondary | ICD-10-CM

## 2023-04-28 NOTE — Telephone Encounter (Signed)
Reordered 04/24/23 #180 3 RF  Requested Prescriptions  Refused Prescriptions Disp Refills   enalapril (VASOTEC) 20 MG tablet [Pharmacy Med Name: ENALAPRIL 20MG  TABLETS] 180 tablet 3    Sig: TAKE 1 TABLET(20 MG) BY MOUTH TWICE DAILY     Cardiovascular:  ACE Inhibitors Failed - 04/27/2023  1:21 AM      Failed - Cr in normal range and within 180 days    Creat  Date Value Ref Range Status  10/16/2022 0.86 0.70 - 1.22 mg/dL Final         Failed - K in normal range and within 180 days    Potassium  Date Value Ref Range Status  10/16/2022 4.4 3.5 - 5.3 mmol/L Final  11/27/2013 3.7 3.5 - 5.1 mmol/L Final         Passed - Patient is not pregnant      Passed - Last BP in normal range    BP Readings from Last 1 Encounters:  04/24/23 136/82         Passed - Valid encounter within last 6 months    Recent Outpatient Visits           4 days ago Essential (primary) hypertension   [place] [facility] Smitty Cords, DO   6 months ago Annual physical exam   [place] [facility] Smitty Cords, DO   1 year ago Essential (primary) hypertension   [place] [facility] Smitty Cords, DO   1 year ago Annual physical exam   [place] [facility] Smitty Cords, DO   2 years ago Essential (primary) hypertension   [place] [facility] Althea Charon, Netta Neat, DO       Future Appointments             In 6 months Althea Charon, Netta Neat, DO [place], [facility]

## 2023-05-05 DIAGNOSIS — I493 Ventricular premature depolarization: Secondary | ICD-10-CM | POA: Diagnosis not present

## 2023-08-11 ENCOUNTER — Ambulatory Visit: Payer: Self-pay

## 2023-08-11 NOTE — Telephone Encounter (Signed)
  Chief Complaint: back pain Symptoms: lower back pain radiates into R hip, comes and goes 10/10 when present  Frequency: 2 weeks  Pertinent Negatives: NA Disposition: [] ED /[] Urgent Care (no appt availability in office) / [x] Appointment(In office/virtual)/ []   Virtual Care/ [] Home Care/ [] Refused Recommended Disposition /[]  Mobile Bus/ []  Follow-up with PCP Additional Notes: pt and daughter present on call. They state he had fall 6 weeks ago but no injuries just skin tears from fall. Pt started having back pain and now radiating into R hip. Pt has been taking Advil for pain that is effective. Scheduled for OV with PCP per daughter request on 08/14/23 at 1120. Care advice given and pt verbalized understanding.   Reason for Disposition  [1] Pain radiates into the thigh or further down the leg AND [2] both legs  Answer Assessment - Initial Assessment Questions 1. ONSET: "When did the pain begin?"      2 weeks  2. LOCATION: "Where does it hurt?" (upper, mid or lower back)     Lower back  3. SEVERITY: "How bad is the pain?"  (e.g., Scale 1-10; mild, moderate, or severe)   - MILD (1-3): Doesn't interfere with normal activities.    - MODERATE (4-7): Interferes with normal activities or awakens from sleep.    - SEVERE (8-10): Excruciating pain, unable to do any normal activities.      10/10 4. PATTERN: "Is the pain constant?" (e.g., yes, no; constant, intermittent)      Comes and goes  5. RADIATION: "Does the pain shoot into your legs or somewhere else?"     R hip  6. CAUSE:  "What do you think is causing the back pain?"      Possibly r/t fall 6 weeks ago  8. MEDICINES: "What have you taken so far for the pain?" (e.g., nothing, acetaminophen, NSAIDS)     Advil  10. OTHER SYMPTOMS: "Do you have any other symptoms?" (e.g., fever, abdomen pain, burning with urination, blood in urine)       no  Protocols used: Back Pain-A-AH

## 2023-08-13 ENCOUNTER — Ambulatory Visit (INDEPENDENT_AMBULATORY_CARE_PROVIDER_SITE_OTHER): Payer: 59 | Admitting: Family Medicine

## 2023-08-13 ENCOUNTER — Encounter: Payer: Self-pay | Admitting: Family Medicine

## 2023-08-13 VITALS — BP 138/72 | HR 61 | Ht 67.0 in | Wt 149.0 lb

## 2023-08-13 DIAGNOSIS — M545 Low back pain, unspecified: Secondary | ICD-10-CM | POA: Diagnosis not present

## 2023-08-13 DIAGNOSIS — M25551 Pain in right hip: Secondary | ICD-10-CM

## 2023-08-13 DIAGNOSIS — G8929 Other chronic pain: Secondary | ICD-10-CM

## 2023-08-13 MED ORDER — TRAMADOL HCL 50 MG PO TABS
50.0000 mg | ORAL_TABLET | ORAL | 0 refills | Status: DC | PRN
Start: 2023-08-13 — End: 2023-10-30

## 2023-08-13 NOTE — Patient Instructions (Addendum)
Thank you for coming to the office today.  Try the Tramadol for pain, take it as needed prefer max 1 to 2 pills per day, be very cautious with sedation.  Take Tylenol as needed as well also Ibuprofen is okay to take regularly.   Please schedule a Follow-up Appointment to: Return if symptoms worsen or fail to improve.  If you have any other questions or concerns, please feel free to call the office or send a message through MyChart. You may also schedule an earlier appointment if necessary.  Additionally, you may be receiving a survey about your experience at our office within a few days to 1 week by e-mail or mail. We value your feedback.  Saralyn Pilar, DO [facility], [place]

## 2023-08-13 NOTE — Progress Notes (Signed)
Subjective:    Patient ID: Zachary Brewer, male    DOB: 1940/12/17, 82 y.o.   MRN: [id number]  Zachary Brewer is a 82 y.o. male presenting on 08/13/2023 for Fall Larey Seat in bathtub about a month ago, hit head, still has hip pain ), Hip Injury (Bilateral,  R worse than L), and Neck Pain   HPI  Discussed the use of AI scribe software for clinical note transcription with the patient, who gave verbal consent to proceed.     The patient, with a history of falls, presents with persistent lower back pain following a fall in the bathtub approximately a month ago. The fall occurred when the patient's suction cup bath mat slipped due to being dry. The patient hit his head and hand during the fall, and was unable to get up for about forty minutes until his daughter was contacted for assistance.  Since the fall, the patient has experienced pain in his lower back, which has since radiated to both hips, with the right hip being more affected. The pain is described as sharp and intermittent, likened to being gouged with a pitchfork. The patient has been managing the pain with over-the-counter Advil and ice packs applied in the morning and evening.   He is seeking a medication to help manage his current pain. The patient is cautious about medication use and prefers to take it only when necessary. He lives alone but has a neighbor close by for assistance if needed.         02/27/2023    2:40 PM 04/18/2022    3:40 PM 02/20/2022    8:13 AM  Depression screen PHQ 2/9  Decreased Interest 0 0 0  Down, Depressed, Hopeless 0 0 0  PHQ - 2 Score 0 0 0  Altered sleeping  0   Tired, decreased energy  0   Change in appetite  0   Feeling bad or failure about yourself   0   Trouble concentrating  0   Moving slowly or fidgety/restless  0   Suicidal thoughts  0   PHQ-9 Score  0   Difficult doing work/chores  Not difficult at all     Social History   Tobacco Use   Smoking status: Never   Smokeless tobacco: Never   Vaping Use   Vaping status: Never Used  Substance Use Topics   Alcohol use: No   Drug use: No    Review of Systems Per HPI unless specifically indicated above     Objective:    BP (!) 162/70   Pulse 61   Ht 5\' 7"  (1.702 m)   Wt 149 lb (67.6 kg) Comment: patient reported, refused to stand on scale  SpO2 98%   BMI 23.34 kg/m   Wt Readings from Last 3 Encounters:  08/13/23 149 lb (67.6 kg)  04/24/23 151 lb (68.5 kg)  02/27/23 149 lb (67.6 kg)    Physical Exam Vitals and nursing note reviewed.  Constitutional:      General: He is not in acute distress.    Appearance: He is well-developed. He is not diaphoretic.     Comments: Well-appearing, comfortable, cooperative  HENT:     Head: Normocephalic and atraumatic.  Eyes:     General:        Right eye: No discharge.        Left eye: No discharge.     Conjunctiva/sclera: Conjunctivae normal.  Neck:     Thyroid: No thyromegaly.  Cardiovascular:     Rate and Rhythm: Normal rate and regular rhythm.     Pulses: Normal pulses.     Heart sounds: Normal heart sounds. No murmur heard. Pulmonary:     Effort: Pulmonary effort is normal. No respiratory distress.     Breath sounds: Normal breath sounds. No wheezing or rales.  Musculoskeletal:        General: Normal range of motion.     Cervical back: Normal range of motion and neck supple.     Comments: Full range of motion bilateral hips, flex extend rotation, some mild discomfort but tolerated very well. Ambulation is normal, using cane for balance.  Lymphadenopathy:     Cervical: No cervical adenopathy.  Skin:    General: Skin is warm and dry.     Findings: No erythema or rash.  Neurological:     Mental Status: He is alert and oriented to person, place, and time. Mental status is at baseline.  Psychiatric:        Behavior: Behavior normal.     Comments: Well groomed, good eye contact, normal speech and thoughts       Results for orders placed or performed in visit on  10/15/22  PSA  Result Value Ref Range   PSA 0.32 < OR = 4.00 ng/mL  Hemoglobin A1c  Result Value Ref Range   Hgb A1c MFr Bld 5.5 <5.7 % of total Hgb   Mean Plasma Glucose 111 mg/dL   eAG (mmol/L) 6.2 mmol/L  CBC with Differential/Platelet  Result Value Ref Range   WBC 6.4 3.8 - 10.8 Thousand/uL   RBC 4.99 4.20 - 5.80 Million/uL   Hemoglobin 16.3 13.2 - 17.1 g/dL   HCT 29.5 28.4 - 13.2 %   MCV 94.8 80.0 - 100.0 fL   MCH 32.7 27.0 - 33.0 pg   MCHC 34.5 32.0 - 36.0 g/dL   RDW 44.0 10.2 - 72.5 %   Platelets 159 140 - 400 Thousand/uL   MPV 11.1 7.5 - 12.5 fL   Neutro Abs 4,915 1,500 - 7,800 cells/uL   Lymphs Abs 1,005 850 - 3,900 cells/uL   Absolute Monocytes 429 200 - 950 cells/uL   Eosinophils Absolute 32 15 - 500 cells/uL   Basophils Absolute 19 0 - 200 cells/uL   Neutrophils Relative % 76.8 %   Total Lymphocyte 15.7 %   Monocytes Relative 6.7 %   Eosinophils Relative 0.5 %   Basophils Relative 0.3 %  Lipid panel  Result Value Ref Range   Cholesterol 140 <200 mg/dL   HDL 42 > OR = 40 mg/dL   Triglycerides 366 <440 mg/dL   LDL Cholesterol (Calc) 79 mg/dL (calc)   Total CHOL/HDL Ratio 3.3 <5.0 (calc)   Non-HDL Cholesterol (Calc) 98 <347 mg/dL (calc)  COMPLETE METABOLIC PANEL WITH GFR  Result Value Ref Range   Glucose, Bld 108 (H) 65 - 99 mg/dL   BUN 14 7 - 25 mg/dL   Creat 4.25 9.56 - 3.87 mg/dL   eGFR 86 > OR = 60 FI/EPP/2.95J8   BUN/Creatinine Ratio SEE NOTE: 6 - 22 (calc)   Sodium 144 135 - 146 mmol/L   Potassium 4.4 3.5 - 5.3 mmol/L   Chloride 104 98 - 110 mmol/L   CO2 32 20 - 32 mmol/L   Calcium 9.4 8.6 - 10.3 mg/dL   Total Protein 6.1 6.1 - 8.1 g/dL   Albumin 4.0 3.6 - 5.1 g/dL   Globulin 2.1 1.9 - 3.7 g/dL (calc)  AG Ratio 1.9 1.0 - 2.5 (calc)   Total Bilirubin 1.0 0.2 - 1.2 mg/dL   Alkaline phosphatase (APISO) 94 35 - 144 U/L   AST 20 10 - 35 U/L   ALT 15 9 - 46 U/L      Assessment & Plan:   Problem List Items Addressed This Visit   None Visit  Diagnoses     Chronic bilateral low back pain without sciatica    -  Primary   Relevant Medications   traMADol (ULTRAM) 50 MG tablet   Chronic right hip pain       Relevant Medications   traMADol (ULTRAM) 50 MG tablet       Assessment and Plan    Fall with Back and Hip Pain Onset 4-6 week ago with fall injury Improved course at this time, but has episodic vs residual pain w/ some chronic arthritis and back pain Exam today is benign without focal bony tenderness or reduced ROM. No obvious sign for hip fracture. His mobility is at baseline.  Okay to continue Tylenol OTC options For breakthrough pain Rx Tramadol AS NEEDED - reviewed cautious dosing and monitor side effects dizziness grogginess sedation (Note he has taken Oxycodone previously for short term, he agrees to try the Tramadol) -Encourage patient to inform a neighbor when starting the medication in case assistance is needed.  Fall Risk Patient fell in the bathtub due to a slippery mat. He has since installed a chair in the bathtub and has not had any falls since. -Continue use of chair in bathtub to reduce fall risk. -Consider additional home safety measures as needed.        Meds ordered this encounter  Medications   traMADol (ULTRAM) 50 MG tablet    Sig: Take 1 tablet (50 mg total) by mouth every 4 (four) hours as needed.    Dispense:  30 tablet    Refill:  0    He is planning to pick up medicine today. Thank you     Follow up plan: Return if symptoms worsen or fail to improve.   Saralyn Pilar, DO [facility] 08/13/2023, 11:48 AM

## 2023-10-23 ENCOUNTER — Other Ambulatory Visit: Payer: Self-pay | Admitting: Family Medicine

## 2023-10-23 DIAGNOSIS — K2100 Gastro-esophageal reflux disease with esophagitis, without bleeding: Secondary | ICD-10-CM

## 2023-10-23 DIAGNOSIS — N138 Other obstructive and reflux uropathy: Secondary | ICD-10-CM

## 2023-10-26 ENCOUNTER — Other Ambulatory Visit: Payer: 59

## 2023-10-26 DIAGNOSIS — Z0000 Encounter for general adult medical examination without abnormal findings: Secondary | ICD-10-CM

## 2023-10-26 DIAGNOSIS — R739 Hyperglycemia, unspecified: Secondary | ICD-10-CM

## 2023-10-26 DIAGNOSIS — I10 Essential (primary) hypertension: Secondary | ICD-10-CM

## 2023-10-26 DIAGNOSIS — N138 Other obstructive and reflux uropathy: Secondary | ICD-10-CM

## 2023-10-26 DIAGNOSIS — E782 Mixed hyperlipidemia: Secondary | ICD-10-CM

## 2023-10-27 NOTE — Telephone Encounter (Signed)
 Requested Prescriptions  Pending Prescriptions Disp Refills   finasteride  (PROSCAR ) 5 MG tablet [Pharmacy Med Name: FINASTERIDE  5MG  TABLETS] 90 tablet 2    Sig: TAKE 1 TABLET(5 MG) BY MOUTH DAILY     Urology: 5-alpha Reductase Inhibitors Failed - 10/27/2023  4:26 PM      Failed - PSA in normal range and within 360 days    PSA  Date Value Ref Range Status  10/16/2022 0.32 < OR = 4.00 ng/mL Final    Comment:    The total PSA value from this assay system is  standardized against the WHO standard. The test  result will be approximately 20% lower when compared  to the equimolar-standardized total PSA (Beckman  Coulter). Comparison of serial PSA results should be  interpreted with this fact in mind. . This test was performed using the Siemens  chemiluminescent method. Values obtained from  different assay methods cannot be used interchangeably. PSA levels, regardless of value, should not be interpreted as absolute evidence of the presence or absence of disease.          Passed - Valid encounter within last 12 months    Recent Outpatient Visits           2 months ago Chronic bilateral low back pain without sciatica   [place] [facility] [place], Marsa PARAS, DO   6 months ago Essential (primary) hypertension   [place] [facility] [place], Marsa PARAS, DO   1 year ago Annual physical exam   [place] [facility] [place] Marsa PARAS, DO   1 year ago Essential (primary) hypertension   [place] [facility] [place], Marsa PARAS, DO   2 years ago Annual physical exam   [place] [facility] [place], Marsa PARAS, DO       Future Appointments             In 3 days [place], Marsa PARAS, DO [facility]             omeprazole  (PRILOSEC) 20 MG capsule [place] Med Name: OMEPRAZOLE  20MG  CAPSULES] 180 capsule 1    Sig: TAKE 1  CAPSULE(20 MG) BY MOUTH TWICE DAILY BEFORE A MEAL     Gastroenterology: Proton Pump Inhibitors Passed - 10/27/2023  4:26 PM      Passed - Valid encounter within last 12 months    Recent Outpatient Visits           2 months ago Chronic bilateral low back pain without sciatica   [place] [facility] [place] Marsa PARAS, DO   6 months ago Essential (primary) hypertension   [place] [facility] [place], Marsa PARAS, DO   1 year ago Annual physical exam   [place] [facility] [place] Marsa PARAS, DO   1 year ago Essential (primary) hypertension   [place] [facility] [place] Marsa PARAS, DO   2 years ago Annual physical exam   [place] [facility] [place] Marsa PARAS, DO       Future Appointments             In 3 days [place], Marsa PARAS, DO [place] [facility], [facility]

## 2023-10-27 NOTE — Telephone Encounter (Signed)
 Requested medication (s) are due for refill today: Due 10/29/22  Requested medication (s) are on the active medication list: yes    Last refill: 01/28/23   #90  2 refills  Future visit scheduled yes 10/30/23  Notes to clinic: Failed due to labs, please review  Requested Prescriptions  Pending Prescriptions Disp Refills   finasteride  (PROSCAR ) 5 MG tablet [Pharmacy Med Name: FINASTERIDE  5MG  TABLETS] 90 tablet 2    Sig: TAKE 1 TABLET(5 MG) BY MOUTH DAILY     Urology: 5-alpha Reductase Inhibitors Failed - 10/27/2023  4:31 PM      Failed - PSA in normal range and within 360 days    PSA  Date Value Ref Range Status  10/16/2022 0.32 < OR = 4.00 ng/mL Final    Comment:    The total PSA value from this assay system is  standardized against the WHO standard. The test  result will be approximately 20% lower when compared  to the equimolar-standardized total PSA (Beckman  Coulter). Comparison of serial PSA results should be  interpreted with this fact in mind. . This test was performed using the Siemens  chemiluminescent method. Values obtained from  different assay methods cannot be used interchangeably. PSA levels, regardless of value, should not be interpreted as absolute evidence of the presence or absence of disease.          Passed - Valid encounter within last 12 months    Recent Outpatient Visits           2 months ago Chronic bilateral low back pain without sciatica   [place] [facility] [place], Marsa PARAS, DO   6 months ago Essential (primary) hypertension   [place] [facility] [place], Marsa PARAS, DO   1 year ago Annual physical exam   [place] [facility] [place] Marsa PARAS, DO   1 year ago Essential (primary) hypertension   [place] [facility] [place] Marsa PARAS, DO   2 years ago Annual physical exam   [place] [facility] [place], Marsa PARAS, DO        Future Appointments             In 3 days [place], Marsa PARAS, DO [place] [facility]            Signed Prescriptions Disp Refills   omeprazole  (PRILOSEC) 20 MG capsule 180 capsule 1    Sig: TAKE 1 CAPSULE(20 MG) BY MOUTH TWICE DAILY BEFORE A MEAL     Gastroenterology: Proton Pump Inhibitors Passed - 10/27/2023  4:31 PM      Passed - Valid encounter within last 12 months    Recent Outpatient Visits           2 months ago Chronic bilateral low back pain without sciatica   [place] [facility] [place] Marsa PARAS, DO   6 months ago Essential (primary) hypertension   [place] [facility] [place] Marsa PARAS, DO   1 year ago Annual physical exam   [place] [facility] [place] Marsa PARAS, DO   1 year ago Essential (primary) hypertension   [place] [facility] [place] Marsa PARAS, DO   2 years ago Annual physical exam    [facility] [place] Marsa PARAS, DO       Future Appointments  In 3 days [place], Marsa PARAS, DO [place] [facility], [facility]

## 2023-10-30 ENCOUNTER — Ambulatory Visit (INDEPENDENT_AMBULATORY_CARE_PROVIDER_SITE_OTHER): Payer: 59 | Admitting: Family Medicine

## 2023-10-30 ENCOUNTER — Encounter: Payer: Self-pay | Admitting: Family Medicine

## 2023-10-30 VITALS — BP 124/78 | HR 86 | Ht 67.0 in | Wt 144.0 lb

## 2023-10-30 DIAGNOSIS — E782 Mixed hyperlipidemia: Secondary | ICD-10-CM | POA: Diagnosis not present

## 2023-10-30 DIAGNOSIS — R739 Hyperglycemia, unspecified: Secondary | ICD-10-CM | POA: Diagnosis not present

## 2023-10-30 DIAGNOSIS — I10 Essential (primary) hypertension: Secondary | ICD-10-CM

## 2023-10-30 DIAGNOSIS — M1120 Other chondrocalcinosis, unspecified site: Secondary | ICD-10-CM

## 2023-10-30 DIAGNOSIS — N401 Enlarged prostate with lower urinary tract symptoms: Secondary | ICD-10-CM

## 2023-10-30 DIAGNOSIS — Z0000 Encounter for general adult medical examination without abnormal findings: Secondary | ICD-10-CM | POA: Diagnosis not present

## 2023-10-30 DIAGNOSIS — N138 Other obstructive and reflux uropathy: Secondary | ICD-10-CM

## 2023-10-30 DIAGNOSIS — M25551 Pain in right hip: Secondary | ICD-10-CM

## 2023-10-30 DIAGNOSIS — I700 Atherosclerosis of aorta: Secondary | ICD-10-CM

## 2023-10-30 DIAGNOSIS — M545 Low back pain, unspecified: Secondary | ICD-10-CM | POA: Diagnosis not present

## 2023-10-30 DIAGNOSIS — G8929 Other chronic pain: Secondary | ICD-10-CM

## 2023-10-30 DIAGNOSIS — J3089 Other allergic rhinitis: Secondary | ICD-10-CM

## 2023-10-30 MED ORDER — TRAMADOL HCL 50 MG PO TABS
50.0000 mg | ORAL_TABLET | ORAL | 0 refills | Status: AC | PRN
Start: 2023-10-30 — End: ?

## 2023-10-30 MED ORDER — FINASTERIDE 5 MG PO TABS: 5.0000 mg | ORAL_TABLET | Freq: Every day | ORAL | 3 refills | Status: DC

## 2023-10-30 MED ORDER — AZELASTINE HCL 0.1 % NA SOLN
2.0000 | Freq: Two times a day (BID) | NASAL | 12 refills | Status: AC
Start: 2023-10-30 — End: ?

## 2023-10-30 NOTE — Assessment & Plan Note (Signed)
Controlled cholesterol on lifestyle Last lipid panel 09/2022 Calculated ASCVD 10 yr risk score elevated due to age, HTN  Plan: 1. Briefly reviewed ASCVD risk - consider low dose statin for future risk reduction, but agree to defer for now, patient not interested in starting new med 2. Continue ASA 81mg for primary ASCVD risk reduction 3. Encourage improved lifestyle - low carb/cholesterol, reduce portion size, continue improving regular exercise 4. Follow-up yearly lipids 

## 2023-10-30 NOTE — Assessment & Plan Note (Signed)
Controlled HYPERTENSION - Home BP readings normal, detailed log today  No known complications    Plan:  1. Continue Enalapril 20mg  BID 2. Encourage improved lifestyle - low sodium diet, regular exercise 3. Continue monitor BP outside office, bring readings to next visit, if persistently >140/90 or new symptoms notify office sooner

## 2023-10-30 NOTE — Patient Instructions (Addendum)
 Thank you for coming to the office today.  Consider RSV vaccine at pharmacy if interested  Refilled Finasteride , Azelastine , and Tramadol  today. To [facility].  Lab orders in, stay tuned for results next week.   Please schedule a Follow-up Appointment to: Return in about 6 months (around 04/28/2024) for 6 month follow-up HTN.  If you have any other questions or concerns, please feel free to call the office or send a message through MyChart. You may also schedule an earlier appointment if necessary.  Additionally, you may be receiving a survey about your experience at our office within a few days to 1 week by e-mail or mail. We value your feedback.  Marsa Officer, DO [facility], [place]

## 2023-10-30 NOTE — Assessment & Plan Note (Signed)
On prior imaging CXR On Aspirin 81mg 

## 2023-10-30 NOTE — Progress Notes (Signed)
 Subjective:    Patient ID: Zachary Brewer, male    DOB: [birth year], 83 y.o.   MRN: [id number]  Zachary Brewer is a 83 y.o. male presenting on 10/30/2023 for Hypertension   HPI  Discussed the use of AI scribe software for clinical note transcription with the patient, who gave verbal consent to proceed.  History of Present Illness     The patient presented for a routine annual physical. He reported no new health issues since the last visit.  The patient also reported occasional back pain for which he takes Tramadol  as needed. He mentioned that he was running low on this medication. He also reported using a nasal spray (azelastine ) for sinus issues, which he found helpful in managing his symptoms. He indicated that he was running low on this medication as well.  The patient has been diligent in monitoring his health at home, including regular weight checks, blood pressure monitoring, and weekly blood sugar checks. He reported a stable weight and blood pressure readings. He also mentioned that he has been managing without his compression socks for about a week with no reported issues.  The patient was up to date on his vaccines, including the flu shot. He was unsure if he had received the RSV vaccine, a newer vaccine available at the pharmacy. He reported receiving a pneumonia shot about a year ago.   CHRONIC HTN: Reports no new concerns. Doing well. BP readings have been controlled. Home BP mostly controlled, checks regularly. Has had some normal readings Admits some salty foods lately Current Meds - Enalapril  20mg  BID Reports good compliance, took meds today. Tolerating well, w/o complaints. Rare dizziness episode Denies CP, dyspnea, HA, edema, dizziness / lightheadedness   History of Elevated Blood Sugar: A1c up to 5.5, from 5.0 to 5.3 range Due for lab today Fam history DM CBGs reviewed, Meds: None (never) Currently on ACEi Lifestyle: - Diet: Healthy diet. No significant  change - Exercise: regular activity some walking Denies hypoglycemia   History of Gastric Outlet Obstruction (S/p Roux-en-Y surgery) / History of weight loss / GERD: - Briefly reviews prior history of s/p gastric surgery in past see prior note for background information - Tolerating diet, has dentures but problem with denture adhesive, see below, stays well hydrated - Taking Omeprazole  20mg  BID for GERD   BPH Reports chronic problem. Doing well on Flomax  0.4mg  x2 daily and Finasteride  5mg . 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 Now he is following, [facility] (Dr French), controlled on regular Tylenol  dosing - He has been on prednisone  PRN in past. - Taking supplements Total Beets and Osteo-Bi Flex   R Hand Carpal Tunnel S/p Carpal Tunnel release by Dr Marchia 05/2022 Improved overall     Health Maintenance: UTD Flu vaccine Updated Shingrix x 2 vaccine COVID Booster updated 08/2022 Pneumovax-23 completed 2023.     02/27/2023    2:40 PM 04/18/2022    3:40 PM 02/20/2022    8:13 AM  Depression screen PHQ 2/9  Decreased Interest 0 0 0  Down, Depressed, Hopeless 0 0 0  PHQ - 2 Score 0 0 0  Altered sleeping  0   Tired, decreased energy  0   Change in appetite  0   Feeling bad or  failure about yourself   0   Trouble concentrating  0   Moving slowly or fidgety/restless  0   Suicidal thoughts  0   PHQ-9 Score  0   Difficult doing work/chores  Not difficult at all        04/18/2022    3:41 PM  GAD 7 : Generalized Anxiety Score  Nervous, Anxious, on Edge 0  Control/stop worrying 0  Worry too much - different things 0  Trouble relaxing 0  Restless 0  Easily annoyed or irritable 0  Afraid - awful might happen 0  Total GAD 7 Score 0   Anxiety Difficulty Not difficult at all     Past Medical History:  Diagnosis Date   Allergy    Arthritis 05/30/2015   Gout - Left knee and ankle   Cervical pain 05/30/2015   GERD (gastroesophageal reflux disease)    Hyperlipidemia    Hypertension    Irregular heart rhythm    Lipoma of neck 05/30/2015   Wears dentures    full upper and lower (doesn't wear lower)   Wears hearing aid    bilateral   Past Surgical History:  Procedure Laterality Date   CARPAL TUNNEL RELEASE Right 06/05/2022   Procedure: CARPAL TUNNEL RELEASE;  Surgeon: Marchia Drivers, MD;  Location: [facility];  Service: Orthopedics;  Laterality: Right;   CATARACT EXTRACTION W/PHACO Right 06/11/2016   Procedure: CATARACT EXTRACTION PHACO AND INTRAOCULAR LENS PLACEMENT (IOC);  Surgeon: Dene Etienne, MD;  Location: [facility];  Service: Ophthalmology;  Laterality: Right;   CATARACT EXTRACTION W/PHACO Left 07/09/2016   Procedure: CATARACT EXTRACTION PHACO AND INTRAOCULAR LENS PLACEMENT (IOC);  Surgeon: Dene Etienne, MD;  Location: [facility];  Service: Ophthalmology;  Laterality: Left;  MALYUGIN   ESOPHAGOGASTRODUODENOSCOPY (EGD) WITH PROPOFOL  N/A 06/20/2015   Procedure: ESOPHAGOGASTRODUODENOSCOPY (EGD) WITH PROPOFOL  with dialation;  Surgeon: Rogelia Copping, MD;  Location: [facility];  Service: Endoscopy;  Laterality: N/A;   FLEXIBLE BRONCHOSCOPY N/A 06/22/2015   Procedure: FLEXIBLE BRONCHOSCOPY;  Surgeon: Elfreda DELENA Bathe, MD;  Location: [facility];  Service: Pulmonary;  Laterality: N/A;   GASTRIC OUTLET OBSTRUCTION RELEASE  2013   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   UPPER GI ENDOSCOPY     Social History   Socioeconomic History   Marital status: Widowed    Spouse name: Not on file   Number of children: 3   Years of education: Not on file   Highest education level: 6th grade  Occupational History   Occupation: retired  Tobacco Use   Smoking status: Never   Smokeless tobacco: Never   Vaping Use   Vaping status: Never Used  Substance and Sexual Activity   Alcohol use: No   Drug use: No   Sexual activity: Not on file  Other Topics Concern   Not on file  Social History Narrative   Not on file   Social Drivers of Health   Financial Resource Strain: Low Risk  (02/27/2023)   Overall Financial Resource Strain (CARDIA)    Difficulty of Paying Living Expenses: Not hard at all  Food Insecurity: No Food Insecurity (02/27/2023)   Hunger Vital Sign    Worried About Running Out of Food in the Last Year: Never true    Ran Out of Food in the Last Year: Never true  Transportation Needs: No Transportation Needs (02/27/2023)   PRAPARE - Administrator, Civil Service (Medical): No    Lack of Transportation (Non-Medical): No  Physical Activity:  Insufficiently Active (02/27/2023)   Exercise Vital Sign    Days of Exercise per Week: 3 days    Minutes of Exercise per Session: 30 min  Stress: No Stress Concern Present (02/27/2023)   [employer] of Occupational Health - Occupational Stress Questionnaire    Feeling of Stress : Not at all  Social Connections: Moderately Isolated (02/27/2023)   Social Connection and Isolation Panel [NHANES]    Frequency of Communication with Friends and Family: More than three times a week    Frequency of Social Gatherings with Friends and Family: Once a week    Attends Religious Services: More than 4 times per year    Active Member of [employer] or Organizations: No    Attends Banker Meetings: Never    Marital Status: Widowed  Intimate Partner Violence: Not At Risk (02/27/2023)   Humiliation, Afraid, Rape, and Kick questionnaire    Fear of Current or Ex-Partner: No    Emotionally Abused: No    Physically Abused: No    Sexually Abused: No   Family History  Problem Relation Age of Onset   Diabetes Mother    Alzheimer's disease Mother    Heart disease Father    Diabetes Sister    Diabetes Brother    Stroke Brother    Mental  illness Brother    Hypertension Paternal Grandfather    Current Outpatient Medications on File Prior to Visit  Medication Sig   Accu-Chek FastClix Lancets MISC Use to check blood sugar up to 1 x weekly   aspirin EC 81 MG tablet Take 81 mg by mouth daily.   Blood Glucose Monitoring Suppl (ACCU-CHEK AVIVA PLUS) w/Device KIT Use glucometer to check blood sugar 1 x weekly as advised   Cholecalciferol (VITAMIN D3) 1000 units CAPS Take 1,000 Units by mouth daily.   enalapril  (VASOTEC ) 20 MG tablet Take 1 tablet (20 mg total) by mouth 2 (two) times daily.   Glucosamine-Chondroit-Vit C-Mn (GLUCOSAMINE 1500 COMPLEX PO) Take 1,500 mg by mouth daily.   glucose blood (ACCU-CHEK AVIVA PLUS) test strip USE TO CHECK BLOOD SUGAR ONCE DAILY   montelukast  (SINGULAIR ) 10 MG tablet Take 1 tablet (10 mg total) by mouth at bedtime.   Multiple Vitamin (MULTIVITAMIN) tablet Take 1 tablet by mouth daily. Reported on 11/21/2015   omeprazole  (PRILOSEC) 20 MG capsule TAKE 1 CAPSULE(20 MG) BY MOUTH TWICE DAILY BEFORE A MEAL   psyllium (METAMUCIL) 58.6 % powder Take 1 packet by mouth daily.   tamsulosin  (FLOMAX ) 0.4 MG CAPS capsule TAKE 2 CAPSULES(0.8 MG) BY MOUTH DAILY AFTER BREAKFAST   No current facility-administered medications on file prior to visit.    Review of Systems  Constitutional:  Negative for activity change, appetite change, chills, diaphoresis, fatigue and fever.  HENT:  Negative for congestion and hearing loss.   Eyes:  Negative for visual disturbance.  Respiratory:  Negative for cough, chest tightness, shortness of breath and wheezing.   Cardiovascular:  Negative for chest pain, palpitations and leg swelling.  Gastrointestinal:  Negative for abdominal pain, constipation, diarrhea, nausea and vomiting.  Genitourinary:  Negative for dysuria, frequency and hematuria.  Musculoskeletal:  Positive for arthralgias. Negative for neck pain.  Skin:  Negative for rash.  Neurological:  Negative for dizziness,  weakness, light-headedness, numbness and headaches.  Hematological:  Negative for adenopathy.  Psychiatric/Behavioral:  Negative for behavioral problems, dysphoric mood and sleep disturbance.    Per HPI unless specifically indicated above     Objective:    BP  124/78   Pulse 86   Ht 5' 7 (1.702 m)   Wt 144 lb (65.3 kg)   SpO2 99%   BMI 22.55 kg/m   Wt Readings from Last 3 Encounters:  10/30/23 144 lb (65.3 kg)  08/13/23 149 lb (67.6 kg)  04/24/23 151 lb (68.5 kg)    Physical Exam Vitals and nursing note reviewed.  Constitutional:      General: He is not in acute distress.    Appearance: He is well-developed. He is not diaphoretic.     Comments: Well-appearing, comfortable, cooperative  HENT:     Head: Normocephalic and atraumatic.  Eyes:     General:        Right eye: No discharge.        Left eye: No discharge.     Conjunctiva/sclera: Conjunctivae normal.     Pupils: Pupils are equal, round, and reactive to light.  Neck:     Thyroid : No thyromegaly.  Cardiovascular:     Rate and Rhythm: Normal rate and regular rhythm.     Pulses: Normal pulses.     Heart sounds: Normal heart sounds. No murmur heard. Pulmonary:     Effort: Pulmonary effort is normal. No respiratory distress.     Breath sounds: Normal breath sounds. No wheezing or rales.  Abdominal:     General: Bowel sounds are normal. There is no distension.     Palpations: Abdomen is soft. There is no mass.     Tenderness: There is no abdominal tenderness.  Musculoskeletal:        General: No tenderness. Normal range of motion.     Cervical back: Normal range of motion and neck supple.     Comments: Upper / Lower Extremities: - Normal muscle tone, strength bilateral upper extremities 5/5, lower extremities 5/5  Lymphadenopathy:     Cervical: No cervical adenopathy.  Skin:    General: Skin is warm and dry.     Findings: No erythema or rash.  Neurological:     Mental Status: He is alert and oriented to  person, place, and time.     Comments: Distal sensation intact to light touch all extremities  Psychiatric:        Mood and Affect: Mood normal.        Behavior: Behavior normal.        Thought Content: Thought content normal.     Comments: Well groomed, good eye contact, normal speech and thoughts     Results for orders placed or performed in visit on 10/15/22  PSA   Collection Time: 10/16/22  7:52 AM  Result Value Ref Range   PSA 0.32 < OR = 4.00 ng/mL  Hemoglobin A1c   Collection Time: 10/16/22  7:52 AM  Result Value Ref Range   Hgb A1c MFr Bld 5.5 <5.7 % of total Hgb   Mean Plasma Glucose 111 mg/dL   eAG (mmol/L) 6.2 mmol/L  CBC with Differential/Platelet   Collection Time: 10/16/22  7:52 AM  Result Value Ref Range   WBC 6.4 3.8 - 10.8 Thousand/uL   RBC 4.99 4.20 - 5.80 Million/uL   Hemoglobin 16.3 13.2 - 17.1 g/dL   HCT 52.6 61.4 - 49.9 %   MCV 94.8 80.0 - 100.0 fL   MCH 32.7 27.0 - 33.0 pg   MCHC 34.5 32.0 - 36.0 g/dL   RDW 87.5 88.9 - 84.9 %   Platelets 159 140 - 400 Thousand/uL   MPV 11.1 7.5 - 12.5 fL  Neutro Abs 4,915 1,500 - 7,800 cells/uL   Lymphs Abs 1,005 850 - 3,900 cells/uL   Absolute Monocytes 429 200 - 950 cells/uL   Eosinophils Absolute 32 15 - 500 cells/uL   Basophils Absolute 19 0 - 200 cells/uL   Neutrophils Relative % 76.8 %   Total Lymphocyte 15.7 %   Monocytes Relative 6.7 %   Eosinophils Relative 0.5 %   Basophils Relative 0.3 %  Lipid panel   Collection Time: 10/16/22  7:52 AM  Result Value Ref Range   Cholesterol 140 <200 mg/dL   HDL 42 > OR = 40 mg/dL   Triglycerides 899 <849 mg/dL   LDL Cholesterol (Calc) 79 mg/dL (calc)   Total CHOL/HDL Ratio 3.3 <5.0 (calc)   Non-HDL Cholesterol (Calc) 98 <869 mg/dL (calc)  COMPLETE METABOLIC PANEL WITH GFR   Collection Time: 10/16/22  7:52 AM  Result Value Ref Range   Glucose, Bld 108 (H) 65 - 99 mg/dL   BUN 14 7 - 25 mg/dL   Creat 9.13 9.29 - 8.77 mg/dL   eGFR 86 > OR = 60 fO/fpw/8.26f7    BUN/Creatinine Ratio SEE NOTE: 6 - 22 (calc)   Sodium 144 135 - 146 mmol/L   Potassium 4.4 3.5 - 5.3 mmol/L   Chloride 104 98 - 110 mmol/L   CO2 32 20 - 32 mmol/L   Calcium 9.4 8.6 - 10.3 mg/dL   Total Protein 6.1 6.1 - 8.1 g/dL   Albumin 4.0 3.6 - 5.1 g/dL   Globulin 2.1 1.9 - 3.7 g/dL (calc)   AG Ratio 1.9 1.0 - 2.5 (calc)   Total Bilirubin 1.0 0.2 - 1.2 mg/dL   Alkaline phosphatase (APISO) 94 35 - 144 U/L   AST 20 10 - 35 U/L   ALT 15 9 - 46 U/L      Assessment & Plan:   Problem List Items Addressed This Visit     Allergic rhinitis   Relevant Medications   azelastine  (ASTELIN ) 0.1 % nasal spray   Aortic atherosclerosis (HCC)   On prior imaging CXR On Aspirin 81mg       BPH with obstruction/lower urinary tract symptoms   Improved BPH on Finasteride  and dose adjust Tamsulosin  History Variety of Severe LUTS and nocturia primarily bothering him - Last DRE uncertain - No known personal/family history of prostate CA  PSA reduced on Finasteride  as expected  Plan: 1. Tamsulosin  0.4mg  x 2 daily 2. Finasteride  5mg  daily  Follow-up as planned future Urologist if needed      Relevant Medications   finasteride  (PROSCAR ) 5 MG tablet   Essential (primary) hypertension   Controlled HYPERTENSION - Home BP readings normal, detailed log today  No known complications    Plan:  1. Continue Enalapril  20mg  BID 2. Encourage improved lifestyle - low sodium diet, regular exercise 3. Continue monitor BP outside office, bring readings to next visit, if persistently >140/90 or new symptoms notify office sooner      Hyperlipidemia   Controlled cholesterol on lifestyle Last lipid panel 09/2022 Calculated ASCVD 10 yr risk score elevated due to age, HTN  Plan: 1. Briefly reviewed ASCVD risk - consider low dose statin for future risk reduction, but agree to defer for now, patient not interested in starting new med 2. Continue ASA 81mg  for primary ASCVD risk reduction 3. Encourage  improved lifestyle - low carb/cholesterol, reduce portion size, continue improving regular exercise 4. Follow-up yearly lipids      Pseudogout   Relevant Medications  traMADol  (ULTRAM ) 50 MG tablet   Other Visit Diagnoses       Annual physical exam    -  Primary     Chronic bilateral low back pain without sciatica       Relevant Medications   traMADol  (ULTRAM ) 50 MG tablet     Chronic right hip pain       Relevant Medications   traMADol  (ULTRAM ) 50 MG tablet        Updated Health Maintenance information Fasting lab orders today Encouraged improvement to lifestyle with diet and exercise Goal of weight loss   Chronic Medication Management Patient is on multiple chronic medications including Finasteride , Azelastine , and Tramadol . Patient reports occasional use of Tramadol  for severe back pain and is running low. Patient also reports use of a nasal spray (Azelastine ) for sinus issues. -Refill Tramadol , Azelastine , and Finasteride  prescriptions and send to local [facility].  Vaccination Status Patient has received flu shot and pneumonia vaccine. Discussed the RSV vaccine, which is available at the pharmacy. -Consider RSV vaccine at the pharmacy.  Annual Physical and Labs Patient is due for annual physical and labs. There was a scheduling issue with the lab, but it has been resolved and patient will have labs drawn today. -Draw fasting labs today. -Call patient with results and also mail a copy of the results.  Follow-up Patient has a Medicare wellness visit scheduled for May 9th. -Schedule a follow-up visit in six months for routine check-up.         No orders of the defined types were placed in this encounter.   Meds ordered this encounter  Medications   traMADol  (ULTRAM ) 50 MG tablet    Sig: Take 1 tablet (50 mg total) by mouth every 4 (four) hours as needed.    Dispense:  30 tablet    Refill:  0   azelastine  (ASTELIN ) 0.1 % nasal spray    Sig: Place 2 sprays  into both nostrils 2 (two) times daily. Use in each nostril as directed    Dispense:  30 mL    Refill:  12   finasteride  (PROSCAR ) 5 MG tablet    Sig: Take 1 tablet (5 mg total) by mouth daily.    Dispense:  90 tablet    Refill:  3     Follow up plan: Return in about 6 months (around 04/28/2024) for 6 month follow-up HTN.  Marsa Officer, DO [facility] 10/30/2023, 8:17 AM

## 2023-10-30 NOTE — Assessment & Plan Note (Signed)
 Improved BPH on Finasteride  and dose adjust Tamsulosin  History Variety of Severe LUTS and nocturia primarily bothering him - Last DRE uncertain - No known personal/family history of prostate CA  PSA reduced on Finasteride  as expected  Plan: 1. Tamsulosin  0.4mg  x 2 daily 2. Finasteride  5mg  daily  Follow-up as planned future Urologist if needed

## 2023-10-31 LAB — COMPLETE METABOLIC PANEL WITH GFR
AG Ratio: 2.1 (calc) (ref 1.0–2.5)
ALT: 24 U/L (ref 9–46)
AST: 28 U/L (ref 10–35)
Albumin: 4.1 g/dL (ref 3.6–5.1)
Alkaline phosphatase (APISO): 115 U/L (ref 35–144)
BUN: 13 mg/dL (ref 7–25)
CO2: 30 mmol/L (ref 20–32)
Calcium: 9.5 mg/dL (ref 8.6–10.3)
Chloride: 102 mmol/L (ref 98–110)
Creat: 0.77 mg/dL (ref 0.70–1.22)
Globulin: 2 g/dL (ref 1.9–3.7)
Glucose, Bld: 100 mg/dL — ABNORMAL HIGH (ref 65–99)
Potassium: 4.2 mmol/L (ref 3.5–5.3)
Sodium: 142 mmol/L (ref 135–146)
Total Bilirubin: 0.9 mg/dL (ref 0.2–1.2)
Total Protein: 6.1 g/dL (ref 6.1–8.1)
eGFR: 88 mL/min/{1.73_m2} (ref >=60)

## 2023-10-31 LAB — CBC WITH DIFFERENTIAL/PLATELET
Absolute Lymphocytes: 949 {cells}/uL (ref 850–3900)
Absolute Monocytes: 371 {cells}/uL (ref 200–950)
Basophils Absolute: 20 {cells}/uL (ref 0–200)
Basophils Relative: 0.3 %
Eosinophils Absolute: 59 {cells}/uL (ref 15–500)
Eosinophils Relative: 0.9 %
HCT: 46.1 % (ref 38.5–50.0)
Hemoglobin: 15.4 g/dL (ref 13.2–17.1)
MCH: 31.8 pg (ref 27.0–33.0)
MCHC: 33.4 g/dL (ref 32.0–36.0)
MCV: 95.2 fL (ref 80.0–100.0)
MPV: 10.3 fL (ref 7.5–12.5)
Monocytes Relative: 5.7 %
Neutro Abs: 5103 {cells}/uL (ref 1500–7800)
Neutrophils Relative %: 78.5 %
Platelets: 198 10*3/uL (ref 140–400)
RBC: 4.84 10*6/uL (ref 4.20–5.80)
RDW: 12.4 % (ref 11.0–15.0)
Total Lymphocyte: 14.6 %
WBC: 6.5 10*3/uL (ref 3.8–10.8)

## 2023-10-31 LAB — HEMOGLOBIN A1C
Hgb A1c MFr Bld: 5.5 %{Hb} (ref <5.7)
Mean Plasma Glucose: 111 mg/dL
eAG (mmol/L): 6.2 mmol/L

## 2023-10-31 LAB — LIPID PANEL
Cholesterol: 121 mg/dL (ref <200)
HDL: 40 mg/dL (ref >=40)
LDL Cholesterol (Calc): 64 mg/dL
Non-HDL Cholesterol (Calc): 81 mg/dL (ref <130)
Total CHOL/HDL Ratio: 3 (calc) (ref <5.0)
Triglycerides: 89 mg/dL (ref <150)

## 2023-10-31 LAB — PSA: PSA: 0.33 ng/mL (ref <=4.00)

## 2023-10-31 LAB — TSH: TSH: 2.91 m[IU]/L (ref 0.40–4.50)

## 2023-11-03 ENCOUNTER — Encounter: Payer: Self-pay | Admitting: Family Medicine

## 2023-11-30 DIAGNOSIS — M1180 Other specified crystal arthropathies, unspecified site: Secondary | ICD-10-CM | POA: Diagnosis not present

## 2023-11-30 DIAGNOSIS — M150 Primary generalized (osteo)arthritis: Secondary | ICD-10-CM | POA: Diagnosis not present

## 2024-01-25 ENCOUNTER — Other Ambulatory Visit: Payer: Self-pay | Admitting: Family Medicine

## 2024-01-25 DIAGNOSIS — N138 Other obstructive and reflux uropathy: Secondary | ICD-10-CM

## 2024-01-26 ENCOUNTER — Other Ambulatory Visit: Payer: Self-pay

## 2024-01-26 DIAGNOSIS — N138 Other obstructive and reflux uropathy: Secondary | ICD-10-CM

## 2024-01-26 MED ORDER — TAMSULOSIN HCL 0.4 MG PO CAPS: 0.8000 mg | ORAL_CAPSULE | Freq: Every day | ORAL | 2 refills | Status: DC

## 2024-01-26 NOTE — Telephone Encounter (Signed)
 Duplicate request- filled 01/26/24 #180 2RF Requested Prescriptions  Pending Prescriptions Disp Refills   tamsulosin (FLOMAX) 0.4 MG CAPS capsule [Pharmacy Med Name: TAMSULOSIN 0.4MG  CAPSULES] 180 capsule 2    Sig: TAKE 2 CAPSULES(0.8 MG) BY MOUTH DAILY AFTER BREAKFAST     There is no refill protocol information for this order

## 2024-03-04 ENCOUNTER — Ambulatory Visit: Payer: 59

## 2024-03-04 DIAGNOSIS — Z0000 Encounter for general adult medical examination without abnormal findings: Secondary | ICD-10-CM

## 2024-03-04 NOTE — Patient Instructions (Signed)
 Zachary Brewer , Thank you for taking time out of your busy schedule to complete your Annual Wellness Visit with me. I enjoyed our conversation and look forward to speaking with you again next year. I, as well as your care team,  appreciate your ongoing commitment to your health goals. Please review the following plan we discussed and let me know if I can assist you in the future.  Follow up Visits: Next Medicare AWV with our clinical staff: 03/10/25 @ 12:40 PM BY PHONE   Have you seen your provider in the last 6 months (3 months if uncontrolled diabetes)? Yes  Clinician Recommendations:  Aim for 30 minutes of exercise or brisk walking, 6-8 glasses of water , and 5 servings of fruits and vegetables each day. TAKE CARE!      This is a list of the screening recommended for you and due dates:  Health Maintenance  Topic Date Due   Flu Shot  05/27/2024   Medicare Annual Wellness Visit  03/04/2025   DTaP/Tdap/Td vaccine (2 - Td or Tdap) 02/24/2033   Pneumonia Vaccine  Completed   Zoster (Shingles) Vaccine  Completed   HPV Vaccine  Aged Out   Meningitis B Vaccine  Aged Out   COVID-19 Vaccine  Discontinued    Advanced directives: (In Chart) A copy of your advanced directives are scanned into your chart should your provider ever need it. Advance Care Planning is important because it:  [x]  Makes sure you receive the medical care that is consistent with your values, goals, and preferences  [x]  It provides guidance to your family and loved ones and reduces their decisional burden about whether or not they are making the right decisions based on your wishes.  Follow the link provided in your after visit summary or read over the paperwork we have mailed to you to help you started getting your Advance Directives in place. If you need assistance in completing these, please reach out to us  so that we can help you!

## 2024-03-04 NOTE — Progress Notes (Signed)
 Subjective:   Zachary Brewer is a 83 y.o. who presents for a Medicare Wellness preventive visit.  As a reminder, Annual Wellness Visits don't include a physical exam, and some assessments may be limited, especially if this visit is performed virtually. We may recommend an in-person visit if needed.  Visit Complete: Virtual I connected with  Zachary Brewer on 03/04/24 by a audio enabled telemedicine application and verified that I am speaking with the correct person using two identifiers.  Patient Location: Home  Provider Location: Office/Clinic  I discussed the limitations of evaluation and management by telemedicine. The patient expressed understanding and agreed to proceed.  Vital Signs: Because this visit was a virtual/telehealth visit, some criteria may be missing or patient reported. Any vitals not documented were not able to be obtained and vitals that have been documented are patient reported.  VideoDeclined- This patient declined Librarian, academic. Therefore the visit was completed with audio only.  Persons Participating in Visit: Patient.  AWV Questionnaire: No: Patient Medicare AWV questionnaire was not completed prior to this visit.  Cardiac Risk Factors include: advanced age (>70men, >58 women);male gender;hypertension;dyslipidemia     Objective:     There were no vitals filed for this visit. There is no height or weight on file to calculate BMI.     03/04/2024    2:15 PM 02/27/2023    2:42 PM 02/25/2023   11:59 AM 06/20/2022    9:03 PM 06/05/2022    6:18 AM 05/28/2022    2:21 PM 02/05/2021   11:49 AM  Advanced Directives  Does Patient Have a Medical Advance Directive? Yes Yes No Yes Yes Yes Yes  Type of Estate agent of [place];Living will Healthcare Power of [place];Living will  Healthcare Power of [place];Living will Living will;Healthcare Power of [employer] Power of [place];Living will  Does patient want  to make changes to medical advance directive? No - Patient declined Yes (Inpatient - patient defers changing a medical advance directive and declines information at this time)       Copy of Healthcare Power of Attorney in Chart? Yes - validated most recent copy scanned in chart (See row information) Yes - validated most recent copy scanned in chart (See row information)   Yes - validated most recent copy scanned in chart (See row information)  No - copy requested  Would patient like information on creating a medical advance directive?  Yes (Inpatient - patient defers creating a medical advance directive and declines information at this time)    No - Patient declined     Current Medications (verified) Outpatient Encounter Medications as of 03/04/2024  Medication Sig   Accu-Chek FastClix Lancets MISC Use to check blood sugar up to 1 x weekly   aspirin EC 81 MG tablet Take 81 mg by mouth daily.   azelastine  (ASTELIN ) 0.1 % nasal spray Place 2 sprays into both nostrils 2 (two) times daily. Use in each nostril as directed   Blood Glucose Monitoring Suppl (ACCU-CHEK AVIVA PLUS) w/Device KIT Use glucometer to check blood sugar 1 x weekly as advised   Cholecalciferol (VITAMIN D3) 1000 units CAPS Take 1,000 Units by mouth daily.   enalapril  (VASOTEC ) 20 MG tablet Take 1 tablet (20 mg total) by mouth 2 (two) times daily.   finasteride  (PROSCAR ) 5 MG tablet Take 1 tablet (5 mg total) by mouth daily.   Glucosamine-Chondroit-Vit C-Mn (GLUCOSAMINE 1500 COMPLEX PO) Take 1,500 mg by mouth daily.  glucose blood (ACCU-CHEK AVIVA PLUS) test strip USE TO CHECK BLOOD SUGAR ONCE DAILY   montelukast  (SINGULAIR ) 10 MG tablet Take 1 tablet (10 mg total) by mouth at bedtime.   Multiple Vitamin (MULTIVITAMIN) tablet Take 1 tablet by mouth daily. Reported on 11/21/2015   omeprazole  (PRILOSEC) 20 MG capsule TAKE 1 CAPSULE(20 MG) BY MOUTH TWICE DAILY BEFORE A MEAL   psyllium (METAMUCIL) 58.6 % powder Take 1 packet by mouth  daily.   tamsulosin  (FLOMAX ) 0.4 MG CAPS capsule Take 2 capsules (0.8 mg total) by mouth daily after breakfast.   traMADol  (ULTRAM ) 50 MG tablet Take 1 tablet (50 mg total) by mouth every 4 (four) hours as needed.   No facility-administered encounter medications on file as of 03/04/2024.    Allergies (verified) Patient has no known allergies.   History: Past Medical History:  Diagnosis Date   Allergy    Arthritis 05/30/2015   Gout - Left knee and ankle   Cervical pain 05/30/2015   GERD (gastroesophageal reflux disease)    Hyperlipidemia    Hypertension    Irregular heart rhythm    Lipoma of neck 05/30/2015   Wears dentures    full upper and lower (doesn't wear lower)   Wears hearing aid    bilateral   Past Surgical History:  Procedure Laterality Date   CARPAL TUNNEL RELEASE Right 06/05/2022   Procedure: CARPAL TUNNEL RELEASE;  Surgeon: Rande Bushy, MD;  Location: [facility];  Service: Orthopedics;  Laterality: Right;   CATARACT EXTRACTION W/PHACO Right 06/11/2016   Procedure: CATARACT EXTRACTION PHACO AND INTRAOCULAR LENS PLACEMENT (IOC);  Surgeon: Annell Kidney, MD;  Location: [facility];  Service: Ophthalmology;  Laterality: Right;   CATARACT EXTRACTION W/PHACO Left 07/09/2016   Procedure: CATARACT EXTRACTION PHACO AND INTRAOCULAR LENS PLACEMENT (IOC);  Surgeon: Annell Kidney, MD;  Location: [facility];  Service: Ophthalmology;  Laterality: Left;  MALYUGIN   ESOPHAGOGASTRODUODENOSCOPY (EGD) WITH PROPOFOL  N/A 06/20/2015   Procedure: ESOPHAGOGASTRODUODENOSCOPY (EGD) WITH PROPOFOL  with dialation;  Surgeon: Marnee Sink, MD;  Location: [facility];  Service: Endoscopy;  Laterality: N/A;   FLEXIBLE BRONCHOSCOPY N/A 06/22/2015   Procedure: FLEXIBLE BRONCHOSCOPY;  Surgeon: Cordie Deters, MD;  Location: [facility];  Service: Pulmonary;  Laterality: N/A;   GASTRIC OUTLET OBSTRUCTION RELEASE  2013   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   UPPER GI ENDOSCOPY      Family History  Problem Relation Age of Onset   Diabetes Mother    Alzheimer's disease Mother    Heart disease Father    Diabetes Sister    Diabetes Brother    Stroke Brother    Mental illness Brother    Hypertension Paternal Grandfather    Social History   Socioeconomic History   Marital status: Widowed    Spouse name: Not on file   Number of children: 3   Years of education: Not on file   Highest education level: 6th grade  Occupational History   Occupation: retired  Tobacco Use   Smoking status: Never   Smokeless tobacco: Never  Vaping Use   Vaping status: Never Used  Substance and Sexual Activity   Alcohol use: No   Drug use: No   Sexual activity: Not on file  Other Topics Concern   Not on file  Social History Narrative   Not on file   Social Drivers of Health   Financial Resource Strain: Low Risk  (03/04/2024)   Overall Financial Resource Strain (CARDIA)    Difficulty of  Paying Living Expenses: Not hard at all  Food Insecurity: No Food Insecurity (03/04/2024)   Hunger Vital Sign    Worried About Running Out of Food in the Last Year: Never true    Ran Out of Food in the Last Year: Never true  Transportation Needs: No Transportation Needs (03/04/2024)   PRAPARE - Administrator, Civil Service (Medical): No    Lack of Transportation (Non-Medical): No  Physical Activity: Insufficiently Active (03/04/2024)   Exercise Vital Sign    Days of Exercise per Week: 3 days    Minutes of Exercise per Session: 30 min  Stress: No Stress Concern Present (03/04/2024)   [employer] of Occupational Health - Occupational Stress Questionnaire    Feeling of Stress : Not at all  Social Connections: Moderately Isolated (03/04/2024)   Social Connection and Isolation Panel [NHANES]    Frequency of Communication with Friends and Family: More than three times a week    Frequency of Social Gatherings with Friends and Family: Twice a week    Attends Religious Services: More  than 4 times per year    Active Member of [employer] or Organizations: No    Attends Banker Meetings: Never    Marital Status: Widowed    Tobacco Counseling Counseling given: Not Answered    Clinical Intake:  Pre-visit preparation completed: Yes  Pain : No/denies pain     BMI - recorded: 22.6 Nutritional Status: BMI of 19-24  Normal Nutritional Risks: None Diabetes: No  Lab Results  Component Value Date   HGBA1C 5.5 10/30/2023   HGBA1C 5.5 10/16/2022   HGBA1C 5.0 10/14/2021     How often do you need to have someone help you when you read instructions, pamphlets, or other written materials from your doctor or pharmacy?: 1 - Never  Interpreter Needed?: No  Information entered by :: Dellie Fergusson, LPN   Activities of Daily Living    03/04/2024    2:18 PM  In your present state of health, do you have any difficulty performing the following activities:  Hearing? 1  Vision? 0  Difficulty concentrating or making decisions? 1  Walking or climbing stairs? 0  Dressing or bathing? 0  Doing errands, shopping? 0  Preparing Food and eating ? N  Using the Toilet? N  In the past six months, have you accidently leaked urine? N  Do you have problems with loss of bowel control? N  Managing your Medications? N  Managing your Finances? N  Housekeeping or managing your Housekeeping? N    Patient Care Team: Raina Bunting, DO as PCP - General (Family Medicine) Bessie Brook, MD as Rounding Team (Internal Medicine)  Indicate any recent Medical Services you may have received from other than [facility] providers in the past year (date may be approximate).     Assessment:    This is a routine wellness examination for Zachary Brewer.  Hearing/Vision screen Hearing Screening - Comments:: NO AIDS Vision Screening - Comments:: WEARS GLASSES ALL DAY-    Goals Addressed             This Visit's Progress    DIET - REDUCE SUGAR INTAKE         Depression  Screen     03/04/2024    2:11 PM 02/27/2023    2:40 PM 04/18/2022    3:40 PM 02/20/2022    8:13 AM 06/20/2021   11:32 AM 02/05/2021   11:51 AM 10/09/2020    8:08 AM  PHQ 2/9 Scores  PHQ - 2 Score 0 0 0 0 0 0 0  PHQ- 9 Score 0  0        Fall Risk     03/04/2024    2:17 PM 02/27/2023    2:44 PM 04/18/2022    3:40 PM 02/20/2022    8:22 AM 04/16/2021    8:01 AM  Fall Risk   Falls in the past year? 0 1 0 0 0  Number falls in past yr: 0 0 0 0 0  Injury with Fall? 0 1 0 0 0  Risk for fall due to : History of fall(s) History of fall(s) No Fall Risks No Fall Risks   Follow up Falls prevention discussed;Falls evaluation completed Falls evaluation completed;Falls prevention discussed Falls evaluation completed Falls evaluation completed Falls evaluation completed    MEDICARE RISK AT HOME:  Medicare Risk at Home Any stairs in or around the home?: No If so, are there any without handrails?: No Home free of loose throw rugs in walkways, pet beds, electrical cords, etc?: Yes Adequate lighting in your home to reduce risk of falls?: Yes Life alert?: Yes Use of a cane, walker or w/c?: Yes (CANE FOR BALANCE) Grab bars in the bathroom?: Yes Shower chair or bench in shower?: Yes Elevated toilet seat or a handicapped toilet?: Yes  TIMED UP AND GO:  Was the test performed?  No  Cognitive Function: 6CIT completed        03/04/2024    2:20 PM 02/27/2023    2:48 PM 02/20/2022    8:23 AM 02/05/2021   11:58 AM 07/12/2019   10:20 AM  6CIT Screen  What Year? 0 points 0 points 0 points 0 points 0 points  What month? 0 points 0 points 0 points 0 points 0 points  What time? 0 points 0 points 0 points 0 points 0 points  Count back from 20 0 points 0 points 0 points 0 points 0 points  Months in reverse 4 points 2 points 0 points 4 points 0 points  Repeat phrase 2 points 4 points 10 points 10 points 4 points  Total Score 6 points 6 points 10 points 14 points 4 points    Immunizations Immunization History   Administered Date(s) Administered   Fluad Quad(high Dose 65+) 07/12/2019   Influenza Inj Mdck Quad With Preservative 08/26/2017   Influenza, High Dose Seasonal PF 07/24/2015, 09/09/2016, 07/29/2021, 07/12/2022   Influenza-Unspecified 08/26/2017, 08/06/2018, 06/27/2020, 07/27/2023   Moderna Covid-19 Fall Seasonal Vaccine 32yrs & older 07/27/2023   Moderna Covid-19 Vaccine Bivalent Booster 66yrs & up 08/12/2021   Moderna SARS-COV2 Booster Vaccination 03/13/2021, 08/27/2022   Moderna Sars-Covid-2 Vaccination 11/08/2019, 12/06/2019, 08/24/2020   PPD Test 08/03/2015   Pneumococcal Conjugate-13 07/28/2014   Pneumococcal Polysaccharide-23 02/20/2022   Tdap 02/25/2023   Zoster Recombinant(Shingrix) 02/24/2022, 04/26/2022    Screening Tests Health Maintenance  Topic Date Due   INFLUENZA VACCINE  05/27/2024   Medicare Annual Wellness (AWV)  03/04/2025   DTaP/Tdap/Td (2 - Td or Tdap) 02/24/2033   Pneumonia Vaccine 33+ Years old  Completed   Zoster Vaccines- Shingrix  Completed   HPV VACCINES  Aged Out   Meningococcal B Vaccine  Aged Out   COVID-19 Vaccine  Discontinued    Health Maintenance  There are no preventive care reminders to display for this patient. Health Maintenance Items Addressed: UP TO DATE ON SHOTS  Additional Screening:  Vision Screening: Recommended annual ophthalmology exams for early detection of glaucoma and  other disorders of the eye.  Dental Screening: Recommended annual dental exams for proper oral hygiene  Community Resource Referral / Chronic Care Management: CRR required this visit?  No   CCM required this visit?  No   Plan:    I have personally reviewed and noted the following in the patient's chart:   Medical and social history Use of alcohol, tobacco or illicit drugs  Current medications and supplements including opioid prescriptions. Patient is not currently taking opioid prescriptions. Functional ability and status Nutritional  status Physical activity Advanced directives List of other physicians Hospitalizations, surgeries, and ER visits in previous 12 months Vitals Screenings to include cognitive, depression, and falls Referrals and appointments  In addition, I have reviewed and discussed with patient certain preventive protocols, quality metrics, and best practice recommendations. A written personalized care plan for preventive services as well as general preventive health recommendations were provided to patient.   Pinky Bright, LPN   11/01/1094   After Visit Summary: (MyChart) Due to this being a telephonic visit, the after visit summary with patients personalized plan was offered to patient via MyChart   Notes: Nothing significant to report at this time.

## 2024-04-22 ENCOUNTER — Other Ambulatory Visit: Payer: Self-pay | Admitting: Family Medicine

## 2024-04-22 DIAGNOSIS — J3089 Other allergic rhinitis: Secondary | ICD-10-CM

## 2024-04-22 DIAGNOSIS — K2100 Gastro-esophageal reflux disease with esophagitis, without bleeding: Secondary | ICD-10-CM

## 2024-04-22 NOTE — Telephone Encounter (Signed)
 Requested Prescriptions  Pending Prescriptions Disp Refills   omeprazole  (PRILOSEC) 20 MG capsule [Pharmacy Med Name: OMEPRAZOLE  20MG  CAPSULES] 180 capsule 0    Sig: TAKE 1 CAPSULE(20 MG) BY MOUTH TWICE DAILY BEFORE A MEAL     Gastroenterology: Proton Pump Inhibitors Passed - 04/22/2024  4:15 PM      Passed - Valid encounter within last 12 months    Recent Outpatient Visits   None     Future Appointments             In 1 week Edman, Marsa PARAS, DO [place] [facility]             montelukast  (SINGULAIR ) 10 MG tablet [Pharmacy Med Name: MONTELUKAST  10MG  TABLETS] 90 tablet 0    Sig: TAKE 1 TABLET(10 MG) BY MOUTH AT BEDTIME     Pulmonology:  Leukotriene Inhibitors Passed - 04/22/2024  4:15 PM      Passed - Valid encounter within last 12 months    Recent Outpatient Visits   None     Future Appointments             In 1 week Edman Marsa PARAS, DO [place] [facility], [facility]

## 2024-05-03 ENCOUNTER — Ambulatory Visit: Payer: 59 | Admitting: Family Medicine

## 2024-05-04 ENCOUNTER — Ambulatory Visit: Payer: Self-pay | Admitting: Family Medicine

## 2024-05-25 ENCOUNTER — Ambulatory Visit (INDEPENDENT_AMBULATORY_CARE_PROVIDER_SITE_OTHER): Admitting: Family Medicine

## 2024-05-25 ENCOUNTER — Encounter: Payer: Self-pay | Admitting: Family Medicine

## 2024-05-25 VITALS — BP 130/74 | HR 76 | Ht 67.0 in | Wt 142.5 lb

## 2024-05-25 DIAGNOSIS — N401 Enlarged prostate with lower urinary tract symptoms: Secondary | ICD-10-CM

## 2024-05-25 DIAGNOSIS — N138 Other obstructive and reflux uropathy: Secondary | ICD-10-CM

## 2024-05-25 DIAGNOSIS — I10 Essential (primary) hypertension: Secondary | ICD-10-CM | POA: Diagnosis not present

## 2024-05-25 DIAGNOSIS — K2100 Gastro-esophageal reflux disease with esophagitis, without bleeding: Secondary | ICD-10-CM | POA: Diagnosis not present

## 2024-05-25 DIAGNOSIS — J3089 Other allergic rhinitis: Secondary | ICD-10-CM | POA: Diagnosis not present

## 2024-05-25 MED ORDER — ENALAPRIL MALEATE 20 MG PO TABS
40.0000 mg | ORAL_TABLET | Freq: Every day | ORAL | 3 refills | Status: AC
Start: 2024-05-25 — End: ?

## 2024-05-25 MED ORDER — OMEPRAZOLE 20 MG PO CPDR
20.0000 mg | DELAYED_RELEASE_CAPSULE | Freq: Two times a day (BID) | ORAL | 3 refills | Status: AC
Start: 2024-05-25 — End: ?

## 2024-05-25 MED ORDER — FINASTERIDE 5 MG PO TABS
5.0000 mg | ORAL_TABLET | Freq: Every day | ORAL | 3 refills | Status: AC
Start: 2024-05-25 — End: ?

## 2024-05-25 MED ORDER — MONTELUKAST SODIUM 10 MG PO TABS
10.0000 mg | ORAL_TABLET | Freq: Every day | ORAL | 3 refills | Status: AC
Start: 2024-05-25 — End: ?

## 2024-05-25 NOTE — Progress Notes (Unsigned)
 Subjective:    Patient ID: Zachary Brewer, male    DOB: 1941/08/28, 83 y.o.   MRN: [id number]  Zachary Brewer is a 83 y.o. male presenting on 05/25/2024 for Hypertension   HPI  Home BP readings 138/98, HR 76, CBG 96, wt 142  CHRONIC HTN: Reports no new concerns. Doing well. BP readings have been controlled. Home BP mostly controlled, checks regularly. Has had some normal readings Admits some salty foods lately Current Meds - Enalapril  20mg   x 2 in AM Reports good compliance, took meds today. Tolerating well, w/o complaints. Rare dizziness episode Denies CP, dyspnea, HA, edema, dizziness / lightheadedness   History of Elevated Blood Sugar: A1c up to 5.5, from 5.0 to 5.3 range Due for lab today Fam history DM CBGs reviewed, Meds: None (never) Currently on ACEi Lifestyle: - Diet: Healthy diet. No significant change - Exercise: regular activity some walking Denies hypoglycemia   History of Gastric Outlet Obstruction (S/p Roux-en-Y surgery) / History of weight loss / GERD: - Briefly reviews prior history of s/p gastric surgery in past see prior note for background information - Tolerating diet, has dentures but problem with denture adhesive, see below, stays well hydrated - Taking Omeprazole  20mg  BID for GERD   BPH Reports chronic problem. Doing well on Flomax  0.4mg  x2 daily and Finasteride  5mg . 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 Now he is following, [facility] (Dr French), controlled on regular Tylenol  dosing - He has been on prednisone  PRN in past. - Taking supplements Total Beets and Osteo-Bi Flex       03/04/2024    2:11 PM 02/27/2023    2:40 PM 04/18/2022    3:40 PM  Depression screen PHQ 2/9  Decreased Interest 0 0 0   Down, Depressed, Hopeless 0 0 0  PHQ - 2 Score 0 0 0  Altered sleeping 0  0  Tired, decreased energy 0  0  Change in appetite 0  0  Feeling bad or failure about yourself  0  0  Trouble concentrating 0  0  Moving slowly or fidgety/restless 0  0  Suicidal thoughts 0  0  PHQ-9 Score 0  0  Difficult doing work/chores Not difficult at all  Not difficult at all       04/18/2022    3:41 PM  GAD 7 : Generalized Anxiety Score  Nervous, Anxious, on Edge 0  Control/stop worrying 0  Worry too much - different things 0  Trouble relaxing 0  Restless 0  Easily annoyed or irritable 0  Afraid - awful might happen 0  Total GAD 7 Score 0  Anxiety Difficulty Not difficult at all    Social History   Tobacco Use   Smoking status: Never   Smokeless tobacco: Never  Vaping Use   Vaping status: Never Used  Substance Use Topics   Alcohol use: No   Drug use: No    Review of Systems Per HPI unless specifically indicated above     Objective:    BP 130/74 (BP Location: Left Arm, Cuff Size: Normal)   Pulse 76   Ht 5' 7 (1.702 m)   Wt 142 lb 8 oz (64.6 kg)   SpO2 96%   BMI 22.32 kg/m   Wt  Readings from Last 3 Encounters:  05/25/24 142 lb 8 oz (64.6 kg)  10/30/23 144 lb (65.3 kg)  08/13/23 149 lb (67.6 kg)    Physical Exam Vitals and nursing note reviewed.  Constitutional:      General: He is not in acute distress.    Appearance: He is well-developed. He is not diaphoretic.     Comments: Well-appearing, comfortable, cooperative  HENT:     Head: Normocephalic and atraumatic.  Eyes:     General:        Right eye: No discharge.        Left eye: No discharge.     Conjunctiva/sclera: Conjunctivae normal.  Neck:     Thyroid : No thyromegaly.  Cardiovascular:     Rate and Rhythm: Normal rate and regular rhythm.     Pulses: Normal pulses.     Heart sounds: Normal heart sounds. No murmur heard. Pulmonary:     Effort: Pulmonary effort is normal. No respiratory distress.     Breath  sounds: Normal breath sounds. No wheezing or rales.  Musculoskeletal:        General: Normal range of motion.     Cervical back: Normal range of motion and neck supple.  Lymphadenopathy:     Cervical: No cervical adenopathy.  Skin:    General: Skin is warm and dry.     Findings: No erythema or rash.  Neurological:     Mental Status: He is alert and oriented to person, place, and time. Mental status is at baseline.  Psychiatric:        Behavior: Behavior normal.     Comments: Well groomed, good eye contact, normal speech and thoughts     Results for orders placed or performed in visit on 10/26/23  TSH   Collection Time: 10/30/23  8:50 AM  Result Value Ref Range   TSH 2.91 0.40 - 4.50 mIU/L  PSA   Collection Time: 10/30/23  8:50 AM  Result Value Ref Range   PSA 0.33 < OR = 4.00 ng/mL  Hemoglobin A1c   Collection Time: 10/30/23  8:50 AM  Result Value Ref Range   Hgb A1c MFr Bld 5.5 <5.7 % of total Hgb   Mean Plasma Glucose 111 mg/dL   eAG (mmol/L) 6.2 mmol/L  Lipid panel   Collection Time: 10/30/23  8:50 AM  Result Value Ref Range   Cholesterol 121 <200 mg/dL   HDL 40 > OR = 40 mg/dL   Triglycerides 89 <849 mg/dL   LDL Cholesterol (Calc) 64 mg/dL (calc)   Total CHOL/HDL Ratio 3.0 <5.0 (calc)   Non-HDL Cholesterol (Calc) 81 <869 mg/dL (calc)  CBC with Differential/Platelet   Collection Time: 10/30/23  8:50 AM  Result Value Ref Range   WBC 6.5 3.8 - 10.8 Thousand/uL   RBC 4.84 4.20 - 5.80 Million/uL   Hemoglobin 15.4 13.2 - 17.1 g/dL   HCT 53.8 61.4 - 49.9 %   MCV 95.2 80.0 - 100.0 fL   MCH 31.8 27.0 - 33.0 pg   MCHC 33.4 32.0 - 36.0 g/dL   RDW 87.5 88.9 - 84.9 %   Platelets 198 140 - 400 Thousand/uL   MPV 10.3 7.5 - 12.5 fL   Neutro Abs 5,103 1,500 - 7,800 cells/uL   Absolute Lymphocytes 949 850 - 3,900 cells/uL   Absolute Monocytes 371 200 - 950 cells/uL   Eosinophils Absolute 59 15 - 500 cells/uL   Basophils Absolute 20 0 - 200 cells/uL   Neutrophils Relative  %  78.5 %   Total Lymphocyte 14.6 %   Monocytes Relative 5.7 %   Eosinophils Relative 0.9 %   Basophils Relative 0.3 %  COMPLETE METABOLIC PANEL WITH GFR   Collection Time: 10/30/23  8:50 AM  Result Value Ref Range   Glucose, Bld 100 (H) 65 - 99 mg/dL   BUN 13 7 - 25 mg/dL   Creat 9.22 9.29 - 8.77 mg/dL   eGFR 88 > OR = 60 fO/fpw/8.26f7   BUN/Creatinine Ratio SEE NOTE: 6 - 22 (calc)   Sodium 142 135 - 146 mmol/L   Potassium 4.2 3.5 - 5.3 mmol/L   Chloride 102 98 - 110 mmol/L   CO2 30 20 - 32 mmol/L   Calcium 9.5 8.6 - 10.3 mg/dL   Total Protein 6.1 6.1 - 8.1 g/dL   Albumin 4.1 3.6 - 5.1 g/dL   Globulin 2.0 1.9 - 3.7 g/dL (calc)   AG Ratio 2.1 1.0 - 2.5 (calc)   Total Bilirubin 0.9 0.2 - 1.2 mg/dL   Alkaline phosphatase (APISO) 115 35 - 144 U/L   AST 28 10 - 35 U/L   ALT 24 9 - 46 U/L      Assessment & Plan:   Problem List Items Addressed This Visit     Allergic rhinitis   Relevant Medications   montelukast  (SINGULAIR ) 10 MG tablet   BPH with obstruction/lower urinary tract symptoms   Relevant Medications   finasteride  (PROSCAR ) 5 MG tablet   Esophagitis, reflux   Relevant Medications   omeprazole  (PRILOSEC) 20 MG capsule   Essential (primary) hypertension - Primary   Relevant Medications   enalapril  (VASOTEC ) 20 MG tablet     Hypertension Blood pressure generally controlled with enalapril . Today's elevated reading likely stress-related. No medication issues currently. - Continue enalapril , 2 pills once daily in the morning. - Reorder enalapril  with instructions for 2 pills once daily. - Monitor blood pressure regularly at home.  Type 2 diabetes mellitus without complications Blood sugar levels well-controlled. - Monitor blood sugar levels regularly.  General Health Maintenance Discussed flu season and COVID booster. - Administer flu shot and updated COVID booster in the fall.        No orders of the defined types were placed in this encounter.   Meds  ordered this encounter  Medications   enalapril  (VASOTEC ) 20 MG tablet    Sig: Take 2 tablets (40 mg total) by mouth daily.    Dispense:  180 tablet    Refill:  3    Future refills on file   finasteride  (PROSCAR ) 5 MG tablet    Sig: Take 1 tablet (5 mg total) by mouth daily.    Dispense:  90 tablet    Refill:  3    Add refills, fill at same time as other meds   montelukast  (SINGULAIR ) 10 MG tablet    Sig: Take 1 tablet (10 mg total) by mouth at bedtime.    Dispense:  90 tablet    Refill:  3    Add refills, fill at same time as other meds   omeprazole  (PRILOSEC) 20 MG capsule    Sig: Take 1 capsule (20 mg total) by mouth 2 (two) times daily before a meal.    Dispense:  180 capsule    Refill:  3    Add refills, fill at same time as other meds    Follow up plan: Return in about 6 months (around 11/25/2024) for 6 month fasting lab > 1 week  later Annual Physical.  Future labs ordered for 10/31/24   Marsa Officer, DO [facility] 05/25/2024, 11:02 AM

## 2024-05-25 NOTE — Patient Instructions (Addendum)
 Thank you for coming to the office today.  BP improved today on re-check  Refilled BP medication  DUE for FASTING BLOOD WORK (no food or drink after midnight before the lab appointment, only water  or coffee without cream/sugar on the morning of)  SCHEDULE Lab Only visit in the morning at the clinic for lab draw in 5 MONTHS   - Make sure Lab Only appointment is at about 1 week before your next appointment, so that results will be available  For Lab Results, once available within 2-3 days of blood draw, you can can log in to MyChart online to view your results and a brief explanation. Also, we can discuss results at next follow-up visit.   Please schedule a Follow-up Appointment to: Return in about 6 months (around 11/25/2024) for 6 month fasting lab > 1 week later Annual Physical.  If you have any other questions or concerns, please feel free to call the office or send a message through MyChart. You may also schedule an earlier appointment if necessary.  Additionally, you may be receiving a survey about your experience at our office within a few days to 1 week by e-mail or mail. We value your feedback.  Marsa Officer, DO [facility], [place]

## 2024-05-26 ENCOUNTER — Other Ambulatory Visit: Payer: Self-pay | Admitting: Family Medicine

## 2024-05-26 DIAGNOSIS — Z0000 Encounter for general adult medical examination without abnormal findings: Secondary | ICD-10-CM

## 2024-05-26 DIAGNOSIS — I10 Essential (primary) hypertension: Secondary | ICD-10-CM

## 2024-05-26 DIAGNOSIS — R739 Hyperglycemia, unspecified: Secondary | ICD-10-CM

## 2024-05-26 DIAGNOSIS — E782 Mixed hyperlipidemia: Secondary | ICD-10-CM

## 2024-05-26 DIAGNOSIS — N138 Other obstructive and reflux uropathy: Secondary | ICD-10-CM

## 2024-06-30 DIAGNOSIS — I10 Essential (primary) hypertension: Secondary | ICD-10-CM | POA: Diagnosis not present

## 2024-06-30 DIAGNOSIS — E785 Hyperlipidemia, unspecified: Secondary | ICD-10-CM | POA: Diagnosis not present

## 2024-07-04 ENCOUNTER — Telehealth: Payer: Self-pay

## 2024-07-04 ENCOUNTER — Other Ambulatory Visit: Payer: Self-pay | Admitting: Family Medicine

## 2024-07-04 DIAGNOSIS — I10 Essential (primary) hypertension: Secondary | ICD-10-CM

## 2024-07-04 NOTE — Telephone Encounter (Signed)
 Patient came by the office to check on refill for enalapril . Spoke with patient, called [facility] pharmacy this prescription will be ready for pick up today after 12pm. Patient was notified of this information

## 2024-07-05 ENCOUNTER — Telehealth: Payer: Self-pay

## 2024-07-05 NOTE — Telephone Encounter (Signed)
 Spoke with patient today. He was confused about his enalapril  prescription. He is switching pharmacy from [facility] [address].    I spoke with [facility] the prescription they have for enalapril  was denied. They could not explain why.    I spoke with Deward at [facility]. They received the transfer of prescriptions. They will process the enalapril  prescription. This should be available later today for pick up.   Patient notified that the prescription should be available today

## 2024-07-05 NOTE — Telephone Encounter (Signed)
 Request too early, refilled 05/25/24 # 180 with 3 refills. Requested Prescriptions  Refused Prescriptions Disp Refills   enalapril  (VASOTEC ) 20 MG tablet [Pharmacy Med Name: ENALAPRIL  20MG  TABLETS] 180 tablet 3    Sig: TAKE 1 TABLET(20 MG) BY MOUTH TWICE DAILY     Cardiovascular:  ACE Inhibitors Failed - 07/05/2024 10:46 AM      Failed - Cr in normal range and within 180 days    Creat  Date Value Ref Range Status  10/30/2023 0.77 0.70 - 1.22 mg/dL Final         Failed - K in normal range and within 180 days    Potassium  Date Value Ref Range Status  10/30/2023 4.2 3.5 - 5.3 mmol/L Final  11/27/2013 3.7 3.5 - 5.1 mmol/L Final         Passed - Patient is not pregnant      Passed - Last BP in normal range    BP Readings from Last 1 Encounters:  05/25/24 130/74         Passed - Valid encounter within last 6 months    Recent Outpatient Visits           1 month ago Essential (primary) hypertension   Zachary Brewer [facility], Marsa PARAS, DO

## 2024-10-21 ENCOUNTER — Other Ambulatory Visit: Payer: Self-pay | Admitting: Family Medicine

## 2024-10-21 DIAGNOSIS — N138 Other obstructive and reflux uropathy: Secondary | ICD-10-CM

## 2024-10-24 NOTE — Telephone Encounter (Signed)
 Requested Prescriptions  Pending Prescriptions Disp Refills   tamsulosin  (FLOMAX ) 0.4 MG CAPS capsule [Pharmacy Med Name: Tamsulosin  HCl 0.4 MG Oral Capsule] 180 capsule 0    Sig: TAKE 2 CAPSULES BY MOUTH ONCE DAILY AFTER BREAKFAST     Urology: Alpha-Adrenergic Blocker Passed - 10/24/2024 12:19 PM      Passed - PSA in normal range and within 360 days    PSA  Date Value Ref Range Status  10/30/2023 0.33 < OR = 4.00 ng/mL Final    Comment:    The total PSA value from this assay system is  standardized against the WHO standard. The test  result will be approximately 20% lower when compared  to the equimolar-standardized total PSA (Beckman  Coulter). Comparison of serial PSA results should be  interpreted with this fact in mind. . This test was performed using the Siemens  chemiluminescent method. Values obtained from  different assay methods cannot be used interchangeably. PSA levels, regardless of value, should not be interpreted as absolute evidence of the presence or absence of disease.          Passed - Last BP in normal range    BP Readings from Last 1 Encounters:  05/25/24 130/74         Passed - Valid encounter within last 12 months    Recent Outpatient Visits           5 months ago Essential (primary) hypertension   [facility] [place], Marsa PARAS, [place]

## 2024-10-25 ENCOUNTER — Telehealth: Payer: Self-pay

## 2024-10-25 NOTE — Telephone Encounter (Signed)
 Patient came by the office. He is dizzy and feels that his blood pressure if low. He mentioned that you may change his blood pressure medication. He stated that he takes one blood pressure in the morning and one at night.   [phone]   [facility] [address]

## 2024-10-25 NOTE — Telephone Encounter (Signed)
 Spoke to patient, notified him of all instructions. Keep appointments as scheduled and bring BP readings. Instructed on pausing enalapril  in the morning and that Flomax  can cause dizziness.

## 2024-10-25 NOTE — Telephone Encounter (Signed)
 It looks like he has labs on 1/5 and physical on 1/12  I would actually not recommend changing his regimen right now. I would need more data or BP readings to make this decision.  I don't see any reported readings.  Last visit in July he had higher BP, not lower. So now he is reporting lower BP. He is also on prostate medication Flomax  that will contribute to dizziness unfortunately.  If possible, I would suggest that we wait until he comes in to see me on 1/12 before we adjust anything.  If he continues to feel dizziness, he may PAUSE Enalapril  20mg  dose in the morning and continue the PM dose of Enalapril .  Marsa Officer, DO [facility] 10/25/2024, 12:07 PM

## 2024-10-31 ENCOUNTER — Other Ambulatory Visit

## 2024-10-31 DIAGNOSIS — R739 Hyperglycemia, unspecified: Secondary | ICD-10-CM

## 2024-10-31 DIAGNOSIS — I10 Essential (primary) hypertension: Secondary | ICD-10-CM

## 2024-10-31 DIAGNOSIS — N138 Other obstructive and reflux uropathy: Secondary | ICD-10-CM

## 2024-10-31 DIAGNOSIS — E782 Mixed hyperlipidemia: Secondary | ICD-10-CM

## 2024-10-31 DIAGNOSIS — Z0000 Encounter for general adult medical examination without abnormal findings: Secondary | ICD-10-CM

## 2024-11-01 LAB — LIPID PANEL
Cholesterol: 141 mg/dL
HDL: 47 mg/dL
LDL Cholesterol (Calc): 73 mg/dL
Non-HDL Cholesterol (Calc): 94 mg/dL
Total CHOL/HDL Ratio: 3 (calc)
Triglycerides: 124 mg/dL

## 2024-11-01 LAB — CBC WITH DIFFERENTIAL/PLATELET
Absolute Lymphocytes: 870 {cells}/uL (ref 850–3900)
Absolute Monocytes: 578 {cells}/uL (ref 200–950)
Basophils Absolute: 23 {cells}/uL (ref 0–200)
Basophils Relative: 0.3 %
Eosinophils Absolute: 60 {cells}/uL (ref 15–500)
Eosinophils Relative: 0.8 %
HCT: 43.9 % (ref 39.4–51.1)
Hemoglobin: 14.3 g/dL (ref 13.2–17.1)
MCH: 31 pg (ref 27.0–33.0)
MCHC: 32.6 g/dL (ref 31.6–35.4)
MCV: 95 fL (ref 81.4–101.7)
MPV: 10.4 fL (ref 7.5–12.5)
Monocytes Relative: 7.7 %
Neutro Abs: 5970 {cells}/uL (ref 1500–7800)
Neutrophils Relative %: 79.6 %
Platelets: 164 Thousand/uL (ref 140–400)
RBC: 4.62 Million/uL (ref 4.20–5.80)
RDW: 12.7 % (ref 11.0–15.0)
Total Lymphocyte: 11.6 %
WBC: 7.5 Thousand/uL (ref 3.8–10.8)

## 2024-11-01 LAB — COMPREHENSIVE METABOLIC PANEL WITH GFR
AG Ratio: 2 (calc) (ref 1.0–2.5)
ALT: 18 U/L (ref 9–46)
AST: 21 U/L (ref 10–35)
Albumin: 4.3 g/dL (ref 3.6–5.1)
Alkaline phosphatase (APISO): 104 U/L (ref 35–144)
BUN/Creatinine Ratio: 23 (calc) — ABNORMAL HIGH (ref 6–22)
BUN: 15 mg/dL (ref 7–25)
CO2: 29 mmol/L (ref 20–32)
Calcium: 9.1 mg/dL (ref 8.6–10.3)
Chloride: 101 mmol/L (ref 98–110)
Creat: 0.65 mg/dL — ABNORMAL LOW (ref 0.70–1.22)
Globulin: 2.1 g/dL (ref 1.9–3.7)
Glucose, Bld: 96 mg/dL (ref 65–99)
Potassium: 4 mmol/L (ref 3.5–5.3)
Sodium: 140 mmol/L (ref 135–146)
Total Bilirubin: 0.9 mg/dL (ref 0.2–1.2)
Total Protein: 6.4 g/dL (ref 6.1–8.1)
eGFR: 92 mL/min/1.73m2

## 2024-11-01 LAB — PSA: PSA: 0.32 ng/mL

## 2024-11-01 LAB — HEMOGLOBIN A1C
Hgb A1c MFr Bld: 5.5 %
Mean Plasma Glucose: 111 mg/dL
eAG (mmol/L): 6.2 mmol/L

## 2024-11-07 ENCOUNTER — Encounter: Payer: Self-pay | Admitting: Family Medicine

## 2024-11-07 ENCOUNTER — Telehealth: Payer: Self-pay

## 2024-11-07 ENCOUNTER — Other Ambulatory Visit (HOSPITAL_COMMUNITY): Payer: Self-pay

## 2024-11-07 ENCOUNTER — Ambulatory Visit: Admitting: Family Medicine

## 2024-11-07 ENCOUNTER — Encounter: Admitting: Family Medicine

## 2024-11-07 VITALS — BP 138/70 | HR 68 | Ht 67.0 in | Wt 139.4 lb

## 2024-11-07 DIAGNOSIS — Z0000 Encounter for general adult medical examination without abnormal findings: Secondary | ICD-10-CM

## 2024-11-07 DIAGNOSIS — M545 Low back pain, unspecified: Secondary | ICD-10-CM

## 2024-11-07 DIAGNOSIS — I700 Atherosclerosis of aorta: Secondary | ICD-10-CM | POA: Diagnosis not present

## 2024-11-07 DIAGNOSIS — M1120 Other chondrocalcinosis, unspecified site: Secondary | ICD-10-CM | POA: Diagnosis not present

## 2024-11-07 DIAGNOSIS — E782 Mixed hyperlipidemia: Secondary | ICD-10-CM

## 2024-11-07 DIAGNOSIS — R739 Hyperglycemia, unspecified: Secondary | ICD-10-CM

## 2024-11-07 DIAGNOSIS — N401 Enlarged prostate with lower urinary tract symptoms: Secondary | ICD-10-CM | POA: Diagnosis not present

## 2024-11-07 DIAGNOSIS — J3089 Other allergic rhinitis: Secondary | ICD-10-CM | POA: Diagnosis not present

## 2024-11-07 DIAGNOSIS — N138 Other obstructive and reflux uropathy: Secondary | ICD-10-CM | POA: Diagnosis not present

## 2024-11-07 DIAGNOSIS — K2100 Gastro-esophageal reflux disease with esophagitis, without bleeding: Secondary | ICD-10-CM | POA: Diagnosis not present

## 2024-11-07 DIAGNOSIS — I10 Essential (primary) hypertension: Secondary | ICD-10-CM

## 2024-11-07 DIAGNOSIS — G8929 Other chronic pain: Secondary | ICD-10-CM | POA: Diagnosis not present

## 2024-11-07 MED ORDER — ACCU-CHEK AVIVA PLUS VI STRP: ORAL_STRIP | 2 refills | Status: AC

## 2024-11-07 NOTE — Progress Notes (Unsigned)
 "  Subjective:    Patient ID: Zachary Brewer, male    DOB: 12/22/1940, 84 y.o.   MRN: [id number]  Zachary Brewer is a 84 y.o. male presenting on 11/07/2024 for Annual Exam   HPI  Discussed the use of AI scribe software for clinical note transcription with the patient, who gave verbal consent to proceed.  History of Present Illness   Spoke to patient, notified him of all instructions. Keep appointments as scheduled and bring BP readings. Instructed on pausing enalapril  in the morning and that Flomax  can cause dizziness.  ***  Last visit in July he had higher BP, not lower. So now he is reporting lower BP. He is also on prostate medication Flomax  that will contribute to dizziness unfortunately.   If possible, I would suggest that we wait until he comes in to see me on 1/12 before we adjust anything.   If he continues to feel dizziness, he may PAUSE Enalapril  20mg  dose in the morning and continue the PM dose of Enalapril .  *** Home BP readings 129-136 / 72-83, high 159/95 Concern for dizzinses symptoms reported recently He initially paused Enalapril  20mg  in the morning and continued in PM His home health nurse assessment advised that the continued dizziness may be related to Tamsulosin . He has stopped Tamsulosin  and now feels better, less dizziness. He can still urinate well, not as much urine output as before but feels he is successfully emptying He then has resumed Enalapril  20mg  TWICE A DAY BP stable now currently    CHRONIC HTN: *** Reports no new concerns. Doing well. BP readings have been controlled. Home BP mostly controlled, checks regularly. Has had some normal readings Admits some salty foods lately Current Meds - Enalapril  20mg   x 2 in AM Reports good compliance, took meds today. Tolerating well, w/o complaints. Rare dizziness episode Denies CP, dyspnea, HA, edema, dizziness / lightheadedness   History of Elevated Blood Sugar: A1c 5.5, and stable CBG < 100 Fam history  DM CBGs reviewed, Needs new Accu-chek AVIVA test strip supply, has old lancet Meds: None (never) Currently on ACEi Lifestyle: - Diet: Healthy diet. No significant change - Exercise: regular activity some walking Denies hypoglycemia   History of Gastric Outlet Obstruction (S/p Roux-en-Y surgery) / History of weight loss / GERD: - Briefly reviews prior history of s/p gastric surgery in past see prior note for background information - Tolerating diet, has dentures but problem with denture adhesive, see below, stays well hydrated - Taking Omeprazole  20mg  BID for GERD   BPH Reports chronic problem. Doing well on Flomax  0.4mg  x2 daily and Finasteride  5mg . 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 Now he is following, [facility] (Dr French), controlled on regular Tylenol  dosing - He has been on prednisone  PRN in past. - Taking supplements Total Beets and Osteo-Bi Flex ***Admits Right Knee Swelling Has upcoming apt with Rheumatology 12/05/24   Health Maintenance:  Flu shot updated, COVID Booster updated  PSA 0.32 negative     03/04/2024    2:11 PM 02/27/2023    2:40 PM 04/18/2022    3:40 PM  Depression screen PHQ 2/9  Decreased Interest 0 0 0  Down, Depressed, Hopeless 0 0 0  PHQ - 2 Score 0 0 0  Altered sleeping 0  0  Tired, decreased energy 0  0  Change in appetite 0  0  Feeling bad or failure about yourself  0  0  Trouble concentrating 0  0  Moving slowly or fidgety/restless 0  0  Suicidal thoughts 0  0  PHQ-9 Score 0   0   Difficult doing work/chores Not difficult at all  Not difficult at all     Data saved with a previous flowsheet row definition       04/18/2022    3:41 PM   GAD 7 : Generalized Anxiety Score  Nervous, Anxious, on Edge 0  Control/stop worrying 0  Worry too much - different things 0  Trouble relaxing 0  Restless 0  Easily annoyed or irritable 0  Afraid - awful might happen 0  Total GAD 7 Score 0  Anxiety Difficulty Not difficult at all     Past Medical History:  Diagnosis Date   Allergy    Arthritis 05/30/2015   Gout - Left knee and ankle   Cervical pain 05/30/2015   GERD (gastroesophageal reflux disease)    Hyperlipidemia    Hypertension    Irregular heart rhythm    Lipoma of neck 05/30/2015   Wears dentures    full upper and lower (doesn't wear lower)   Wears hearing aid    bilateral   Past Surgical History:  Procedure Laterality Date   CARPAL TUNNEL RELEASE Right 06/05/2022   Procedure: CARPAL TUNNEL RELEASE;  Surgeon: Marchia Drivers, MD;  Location: [facility];  Service: Orthopedics;  Laterality: Right;   CATARACT EXTRACTION W/PHACO Right 06/11/2016   Procedure: CATARACT EXTRACTION PHACO AND INTRAOCULAR LENS PLACEMENT (IOC);  Surgeon: Dene Etienne, MD;  Location: [facility];  Service: Ophthalmology;  Laterality: Right;   CATARACT EXTRACTION W/PHACO Left 07/09/2016   Procedure: CATARACT EXTRACTION PHACO AND INTRAOCULAR LENS PLACEMENT (IOC);  Surgeon: Dene Etienne, MD;  Location: [facility];  Service: Ophthalmology;  Laterality: Left;  MALYUGIN   ESOPHAGOGASTRODUODENOSCOPY (EGD) WITH PROPOFOL  N/A 06/20/2015   Procedure: ESOPHAGOGASTRODUODENOSCOPY (EGD) WITH PROPOFOL  with dialation;  Surgeon: Rogelia Copping, MD;  Location: [facility];  Service: Endoscopy;  Laterality: N/A;   FLEXIBLE BRONCHOSCOPY N/A 06/22/2015   Procedure: FLEXIBLE BRONCHOSCOPY;  Surgeon: Elfreda DELENA Bathe, MD;  Location: [facility];  Service: Pulmonary;  Laterality: N/A;   GASTRIC OUTLET OBSTRUCTION RELEASE  2013   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   UPPER GI ENDOSCOPY     Social History   Socioeconomic History   Marital status:  Widowed    Spouse name: Not on file   Number of children: 3   Years of education: Not on file   Highest education level: 6th grade  Occupational History   Occupation: retired  Tobacco Use   Smoking status: Never   Smokeless tobacco: Never  Vaping Use   Vaping status: Never Used  Substance and Sexual Activity   Alcohol use: No   Drug use: No   Sexual activity: Not on file  Other Topics Concern   Not on file  Social History Narrative   Not on file   Social Drivers of Health   Tobacco Use: Low Risk (11/07/2024)   Patient History    Smoking Tobacco Use: Never    Smokeless Tobacco Use: Never    Passive Exposure: Not on file  Financial Resource Strain: Low Risk (03/04/2024)   Overall Financial Resource Strain (CARDIA)    Difficulty  of Paying Living Expenses: Not hard at all  Food Insecurity: No Food Insecurity (03/04/2024)   Hunger Vital Sign    Worried About Running Out of Food in the Last Year: Never true    Ran Out of Food in the Last Year: Never true  Transportation Needs: No Transportation Needs (03/04/2024)   PRAPARE - Administrator, Civil Service (Medical): No    Lack of Transportation (Non-Medical): No  Physical Activity: Insufficiently Active (03/04/2024)   Exercise Vital Sign    Days of Exercise per Week: 3 days    Minutes of Exercise per Session: 30 min  Stress: No Stress Concern Present (03/04/2024)   [employer] of Occupational Health - Occupational Stress Questionnaire    Feeling of Stress : Not at all  Social Connections: Moderately Isolated (03/04/2024)   Social Connection and Isolation Panel    Frequency of Communication with Friends and Family: More than three times a week    Frequency of Social Gatherings with Friends and Family: Twice a week    Attends Religious Services: More than 4 times per year    Active Member of [employer] or Organizations: No    Attends Banker Meetings: Never    Marital Status: Widowed  Intimate Partner  Violence: Not At Risk (03/04/2024)   Humiliation, Afraid, Rape, and Kick questionnaire    Fear of Current or Ex-Partner: No    Emotionally Abused: No    Physically Abused: No    Sexually Abused: No  Depression (PHQ2-9): Low Risk (03/04/2024)   Depression (PHQ2-9)    PHQ-2 Score: 0  Alcohol Screen: Low Risk (03/04/2024)   Alcohol Screen    Last Alcohol Screening Score (AUDIT): 0  Housing: Unknown (03/04/2024)   Housing Stability Vital Sign    Unable to Pay for Housing in the Last Year: No    Number of Times Moved in the Last Year: Not on file    Homeless in the Last Year: No  Utilities: Not At Risk (03/04/2024)   AHC Utilities    Threatened with loss of utilities: No  Health Literacy: Adequate Health Literacy (03/04/2024)   B1300 Health Literacy    Frequency of need for help with medical instructions: Never   Family History  Problem Relation Age of Onset   Diabetes Mother    Alzheimer's disease Mother    Heart disease Father    Diabetes Sister    Diabetes Brother    Stroke Brother    Mental illness Brother    Hypertension Paternal Grandfather    Current Outpatient Medications on File Prior to Visit  Medication Sig   Accu-Chek FastClix Lancets MISC Use to check blood sugar up to 1 x weekly   aspirin EC 81 MG tablet Take 81 mg by mouth daily.   Blood Glucose Monitoring Suppl (ACCU-CHEK AVIVA PLUS) w/Device KIT Use glucometer to check blood sugar 1 x weekly as advised   Cholecalciferol (VITAMIN D3) 1000 units CAPS Take 1,000 Units by mouth daily.   enalapril  (VASOTEC ) 20 MG tablet Take 2 tablets (40 mg total) by mouth daily.   finasteride  (PROSCAR ) 5 MG tablet Take 1 tablet (5 mg total) by mouth daily.   Glucosamine-Chondroit-Vit C-Mn (GLUCOSAMINE 1500 COMPLEX PO) Take 1,500 mg by mouth daily.   montelukast  (SINGULAIR ) 10 MG tablet Take 1 tablet (10 mg total) by mouth at bedtime.   Multiple Vitamin (MULTIVITAMIN) tablet Take 1 tablet by mouth daily. Reported on 11/21/2015   omeprazole   (PRILOSEC) 20 MG  capsule Take 1 capsule (20 mg total) by mouth 2 (two) times daily before a meal.   psyllium (METAMUCIL) 58.6 % powder Take 1 packet by mouth daily.   traMADol  (ULTRAM ) 50 MG tablet Take 1 tablet (50 mg total) by mouth every 4 (four) hours as needed.   azelastine  (ASTELIN ) 0.1 % nasal spray Place 2 sprays into both nostrils 2 (two) times daily. Use in each nostril as directed (Patient not taking: Reported on 11/07/2024)   tamsulosin  (FLOMAX ) 0.4 MG CAPS capsule Take 1 capsule (0.4 mg total) by mouth daily after breakfast.   No current facility-administered medications on file prior to visit.    Review of Systems  Constitutional:  Negative for activity change, appetite change, chills, diaphoresis, fatigue and fever.  HENT:  Negative for congestion and hearing loss.   Eyes:  Negative for visual disturbance.  Respiratory:  Negative for cough, chest tightness, shortness of breath and wheezing.   Cardiovascular:  Negative for chest pain, palpitations and leg swelling.  Gastrointestinal:  Negative for abdominal pain, constipation, diarrhea, nausea and vomiting.  Genitourinary:  Negative for dysuria, frequency and hematuria.  Musculoskeletal:  Negative for arthralgias and neck pain.  Skin:  Negative for rash.  Neurological:  Positive for dizziness. Negative for weakness, light-headedness, numbness and headaches.  Hematological:  Negative for adenopathy.  Psychiatric/Behavioral:  Negative for behavioral problems, dysphoric mood and sleep disturbance.    Per HPI unless specifically indicated above     Objective:    BP 138/70 (BP Location: Left Arm, Cuff Size: Normal)   Pulse 68   Ht 5' 7 (1.702 m)   Wt 139 lb 6 oz (63.2 kg)   SpO2 99%   BMI 21.83 kg/m   Wt Readings from Last 3 Encounters:  11/07/24 139 lb 6 oz (63.2 kg)  05/25/24 142 lb 8 oz (64.6 kg)  10/30/23 144 lb (65.3 kg)    Physical Exam Vitals and nursing note reviewed.  Constitutional:      General: He is  not in acute distress.    Appearance: He is well-developed. He is not diaphoretic.     Comments: Well-appearing, comfortable, cooperative  HENT:     Head: Normocephalic and atraumatic.  Eyes:     General:        Right eye: No discharge.        Left eye: No discharge.     Conjunctiva/sclera: Conjunctivae normal.     Pupils: Pupils are equal, round, and reactive to light.  Neck:     Thyroid : No thyromegaly.  Cardiovascular:     Rate and Rhythm: Normal rate and regular rhythm.     Pulses: Normal pulses.     Heart sounds: Normal heart sounds. No murmur heard. Pulmonary:     Effort: Pulmonary effort is normal. No respiratory distress.     Breath sounds: Normal breath sounds. No wheezing or rales.  Abdominal:     General: Bowel sounds are normal. There is no distension.     Palpations: Abdomen is soft. There is no mass.     Tenderness: There is no abdominal tenderness.  Musculoskeletal:        General: No tenderness. Normal range of motion.     Cervical back: Normal range of motion and neck supple.     Right lower leg: No edema.     Left lower leg: No edema.     Comments: Right knee mild effusion. Non tender no erythema.  Upper / Lower Extremities: - Normal muscle  tone, strength bilateral upper extremities 5/5, lower extremities 5/5  Lymphadenopathy:     Cervical: No cervical adenopathy.  Skin:    General: Skin is warm and dry.     Findings: No erythema or rash.  Neurological:     Mental Status: He is alert and oriented to person, place, and time.     Comments: Distal sensation intact to light touch all extremities  Psychiatric:        Mood and Affect: Mood normal.        Behavior: Behavior normal.        Thought Content: Thought content normal.     Comments: Well groomed, good eye contact, normal speech and thoughts     Results for orders placed or performed in visit on 10/31/24  Comprehensive metabolic panel with GFR   Collection Time: 10/31/24  7:56 AM  Result Value  Ref Range   Glucose, Bld 96 65 - 99 mg/dL   BUN 15 7 - 25 mg/dL   Creat 9.34 (L) 9.29 - 1.22 mg/dL   eGFR 92 > OR = 60 fO/fpw/8.26f7   BUN/Creatinine Ratio 23 (H) 6 - 22 (calc)   Sodium 140 135 - 146 mmol/L   Potassium 4.0 3.5 - 5.3 mmol/L   Chloride 101 98 - 110 mmol/L   CO2 29 20 - 32 mmol/L   Calcium 9.1 8.6 - 10.3 mg/dL   Total Protein 6.4 6.1 - 8.1 g/dL   Albumin 4.3 3.6 - 5.1 g/dL   Globulin 2.1 1.9 - 3.7 g/dL (calc)   AG Ratio 2.0 1.0 - 2.5 (calc)   Total Bilirubin 0.9 0.2 - 1.2 mg/dL   Alkaline phosphatase (APISO) 104 35 - 144 U/L   AST 21 10 - 35 U/L   ALT 18 9 - 46 U/L  PSA   Collection Time: 10/31/24  7:56 AM  Result Value Ref Range   PSA 0.32 < OR = 4.00 ng/mL  CBC with Differential/Platelet   Collection Time: 10/31/24  7:56 AM  Result Value Ref Range   WBC 7.5 3.8 - 10.8 Thousand/uL   RBC 4.62 4.20 - 5.80 Million/uL   Hemoglobin 14.3 13.2 - 17.1 g/dL   HCT 56.0 60.5 - 48.8 %   MCV 95.0 81.4 - 101.7 fL   MCH 31.0 27.0 - 33.0 pg   MCHC 32.6 31.6 - 35.4 g/dL   RDW 87.2 88.9 - 84.9 %   Platelets 164 140 - 400 Thousand/uL   MPV 10.4 7.5 - 12.5 fL   Neutro Abs 5,970 1,500 - 7,800 cells/uL   Absolute Lymphocytes 870 850 - 3,900 cells/uL   Absolute Monocytes 578 200 - 950 cells/uL   Eosinophils Absolute 60 15 - 500 cells/uL   Basophils Absolute 23 0 - 200 cells/uL   Neutrophils Relative % 79.6 %   Total Lymphocyte 11.6 %   Monocytes Relative 7.7 %   Eosinophils Relative 0.8 %   Basophils Relative 0.3 %  Hemoglobin A1c   Collection Time: 10/31/24  7:56 AM  Result Value Ref Range   Hgb A1c MFr Bld 5.5 <5.7 %   Mean Plasma Glucose 111 mg/dL   eAG (mmol/L) 6.2 mmol/L  Lipid panel   Collection Time: 10/31/24  7:56 AM  Result Value Ref Range   Cholesterol 141 <200 mg/dL   HDL 47 > OR = 40 mg/dL   Triglycerides 875 <849 mg/dL   LDL Cholesterol (Calc) 73 mg/dL (calc)   Total CHOL/HDL Ratio 3.0 <5.0 (calc)   Non-HDL  Cholesterol (Calc) 94 <130 mg/dL (calc)       Assessment & Plan:   Problem List Items Addressed This Visit     Allergic rhinitis   Aortic atherosclerosis   Blood glucose elevated   Relevant Medications   glucose blood (ACCU-CHEK AVIVA PLUS) test strip   BPH with obstruction/lower urinary tract symptoms   Relevant Medications   tamsulosin  (FLOMAX ) 0.4 MG CAPS capsule   Esophagitis, reflux   Essential (primary) hypertension   Hyperlipidemia   Pseudogout   Other Visit Diagnoses       Annual physical exam    -  Primary     Chronic bilateral low back pain without sciatica            Updated Health Maintenance information Reviewed recent lab results with patient Encouraged improvement to lifestyle with diet and exercise Goal of weight loss  Assessment and Plan Assessment & Plan      No orders of the defined types were placed in this encounter.   Meds ordered this encounter  Medications   glucose blood (ACCU-CHEK AVIVA PLUS) test strip    Sig: USE TO CHECK BLOOD SUGAR ONCE DAILY    Dispense:  100 strip    Refill:  2    Accu-chek Aviva     Follow up plan: Return in about 6 months (around 05/07/2025) for 6 month PreDM A1c, HTN, BPH med updates .  Marsa Officer, DO [facility] 11/07/2024, 8:17 AM   "

## 2024-11-07 NOTE — Patient Instructions (Addendum)
 Thank you for coming to the office today.  REDUCE dose of the Capsule Tamsulosin  (Flomax ) in the morning - Now take 1 instead of 2 capsules - This may reduce your dizziness. - Keep an eye on the urination if you are still able to urinate well  See if this helps your balance and dizziness.  If you are doing better, you can resume or restart the MONTELUKAST  Singulair  tablet at bedtime. (For allergies)  Keep an eye on the BP.  Keep taking Enalapril  twice per day  Please schedule a Follow-up Appointment to: Return in about 6 months (around 05/07/2025) for 6 month PreDM A1c, HTN, BPH med updates .  If you have any other questions or concerns, please feel free to call the office or send a message through MyChart. You may also schedule an earlier appointment if necessary.  Additionally, you may be receiving a survey about your experience at our office within a few days to 1 week by e-mail or mail. We value your feedback.  Marsa Officer, DO [facility], [place]

## 2024-11-07 NOTE — Telephone Encounter (Signed)
 Pharmacy Patient Advocate Encounter   Received notification from [facility] that prior authorization for Accu-Chek Aviva Plus Test strips is required/requested.   Insurance verification completed.   The patient is insured through [facility].   Per test claim: PA required; PA submitted to above mentioned insurance via Latent Key/confirmation #/EOC [id number] Status is pending

## 2025-03-10 ENCOUNTER — Ambulatory Visit

## 2025-03-15 ENCOUNTER — Ambulatory Visit

## 2025-05-10 ENCOUNTER — Ambulatory Visit: Admitting: Family Medicine
